# Patient Record
Sex: Female | Born: 1935 | Race: White | Hispanic: No | Marital: Married | Smoking: Never smoker
Health system: Southern US, Community
[De-identification: ages and names within clinical notes are randomized; demographics above are authoritative.]

## PROBLEM LIST (undated history)

## (undated) DIAGNOSIS — N183 Chronic kidney disease, stage 3 unspecified: Secondary | ICD-10-CM

## (undated) DIAGNOSIS — Z79899 Other long term (current) drug therapy: Secondary | ICD-10-CM

## (undated) DIAGNOSIS — F0390 Unspecified dementia without behavioral disturbance: Secondary | ICD-10-CM

## (undated) DIAGNOSIS — I701 Atherosclerosis of renal artery: Secondary | ICD-10-CM

## (undated) DIAGNOSIS — I5032 Chronic diastolic (congestive) heart failure: Secondary | ICD-10-CM

## (undated) DIAGNOSIS — I482 Chronic atrial fibrillation, unspecified: Secondary | ICD-10-CM

## (undated) DIAGNOSIS — I3139 Other pericardial effusion (noninflammatory): Secondary | ICD-10-CM

## (undated) DIAGNOSIS — I313 Pericardial effusion (noninflammatory): Secondary | ICD-10-CM

## (undated) DIAGNOSIS — R001 Bradycardia, unspecified: Secondary | ICD-10-CM

## (undated) DIAGNOSIS — I2510 Atherosclerotic heart disease of native coronary artery without angina pectoris: Secondary | ICD-10-CM

## (undated) DIAGNOSIS — K219 Gastro-esophageal reflux disease without esophagitis: Secondary | ICD-10-CM

## (undated) DIAGNOSIS — I495 Sick sinus syndrome: Secondary | ICD-10-CM

## (undated) DIAGNOSIS — R29898 Other symptoms and signs involving the musculoskeletal system: Secondary | ICD-10-CM

## (undated) DIAGNOSIS — I10 Essential (primary) hypertension: Secondary | ICD-10-CM

## (undated) HISTORY — DX: Pericardial effusion (noninflammatory): I31.3

## (undated) HISTORY — DX: Essential (primary) hypertension: I10

## (undated) HISTORY — PX: KNEE ARTHROSCOPY: SUR90

## (undated) HISTORY — DX: Other long term (current) drug therapy: Z79.899

## (undated) HISTORY — DX: Other symptoms and signs involving the musculoskeletal system: R29.898

## (undated) HISTORY — PX: CHOLECYSTECTOMY: SHX55

## (undated) HISTORY — PX: CARDIOVERSION: SHX1299

## (undated) HISTORY — DX: Chronic atrial fibrillation, unspecified: I48.20

## (undated) HISTORY — DX: Chronic kidney disease, stage 3 (moderate): N18.3

## (undated) HISTORY — DX: Atherosclerosis of renal artery: I70.1

## (undated) HISTORY — DX: Chronic kidney disease, stage 3 unspecified: N18.30

## (undated) HISTORY — DX: Chronic diastolic (congestive) heart failure: I50.32

## (undated) HISTORY — DX: Sick sinus syndrome: I49.5

## (undated) HISTORY — PX: BACK SURGERY: SHX140

## (undated) HISTORY — DX: Other pericardial effusion (noninflammatory): I31.39

## (undated) HISTORY — DX: Bradycardia, unspecified: R00.1

## (undated) HISTORY — DX: Gastro-esophageal reflux disease without esophagitis: K21.9

---

## 2006-12-17 ENCOUNTER — Ambulatory Visit: Payer: Self-pay | Admitting: Cardiology

## 2006-12-27 ENCOUNTER — Ambulatory Visit: Payer: Self-pay | Admitting: Cardiology

## 2007-01-20 ENCOUNTER — Ambulatory Visit: Payer: Self-pay | Admitting: Physician Assistant

## 2007-02-28 ENCOUNTER — Ambulatory Visit: Payer: Self-pay | Admitting: Cardiology

## 2007-03-11 ENCOUNTER — Ambulatory Visit: Payer: Self-pay | Admitting: Cardiology

## 2007-03-15 ENCOUNTER — Ambulatory Visit: Payer: Self-pay | Admitting: Cardiology

## 2007-04-04 ENCOUNTER — Ambulatory Visit: Payer: Self-pay | Admitting: Cardiology

## 2007-04-12 ENCOUNTER — Ambulatory Visit: Payer: Self-pay | Admitting: Cardiology

## 2007-04-13 ENCOUNTER — Ambulatory Visit: Payer: Self-pay | Admitting: Cardiology

## 2007-04-21 ENCOUNTER — Ambulatory Visit: Payer: Self-pay | Admitting: Cardiology

## 2007-04-25 ENCOUNTER — Ambulatory Visit: Payer: Self-pay | Admitting: Cardiology

## 2007-04-28 ENCOUNTER — Ambulatory Visit: Payer: Self-pay | Admitting: Cardiology

## 2007-05-04 ENCOUNTER — Ambulatory Visit: Payer: Self-pay | Admitting: Cardiology

## 2007-05-13 ENCOUNTER — Ambulatory Visit: Payer: Self-pay | Admitting: Cardiology

## 2007-05-19 ENCOUNTER — Ambulatory Visit: Payer: Self-pay | Admitting: Cardiology

## 2007-05-20 ENCOUNTER — Ambulatory Visit: Payer: Self-pay | Admitting: Cardiology

## 2007-05-27 ENCOUNTER — Ambulatory Visit: Payer: Self-pay | Admitting: Cardiology

## 2007-07-15 ENCOUNTER — Ambulatory Visit: Payer: Self-pay | Admitting: Cardiology

## 2007-08-12 ENCOUNTER — Ambulatory Visit: Payer: Self-pay | Admitting: Cardiology

## 2007-08-24 ENCOUNTER — Ambulatory Visit: Payer: Self-pay | Admitting: Cardiology

## 2007-09-09 ENCOUNTER — Ambulatory Visit: Payer: Self-pay | Admitting: Cardiology

## 2007-09-23 ENCOUNTER — Ambulatory Visit: Payer: Self-pay | Admitting: Cardiology

## 2007-10-21 ENCOUNTER — Ambulatory Visit: Payer: Self-pay | Admitting: Cardiology

## 2007-11-21 ENCOUNTER — Ambulatory Visit: Payer: Self-pay | Admitting: Cardiology

## 2008-01-05 ENCOUNTER — Ambulatory Visit: Payer: Self-pay | Admitting: Cardiology

## 2008-02-02 ENCOUNTER — Ambulatory Visit: Payer: Self-pay | Admitting: Cardiology

## 2008-03-01 ENCOUNTER — Ambulatory Visit: Payer: Self-pay | Admitting: Cardiology

## 2008-03-12 ENCOUNTER — Encounter: Payer: Self-pay | Admitting: Cardiology

## 2008-03-12 ENCOUNTER — Ambulatory Visit: Payer: Self-pay | Admitting: Cardiology

## 2008-04-03 ENCOUNTER — Ambulatory Visit: Payer: Self-pay | Admitting: Cardiology

## 2008-05-11 ENCOUNTER — Ambulatory Visit: Payer: Self-pay | Admitting: Cardiology

## 2008-05-25 ENCOUNTER — Ambulatory Visit: Payer: Self-pay | Admitting: Cardiology

## 2008-06-15 ENCOUNTER — Ambulatory Visit: Payer: Self-pay | Admitting: Cardiology

## 2008-07-17 ENCOUNTER — Ambulatory Visit: Payer: Self-pay | Admitting: Cardiology

## 2008-08-14 ENCOUNTER — Ambulatory Visit: Payer: Self-pay | Admitting: Cardiology

## 2008-08-16 ENCOUNTER — Encounter: Payer: Self-pay | Admitting: Cardiology

## 2008-09-14 ENCOUNTER — Ambulatory Visit: Payer: Self-pay | Admitting: Cardiology

## 2008-10-12 ENCOUNTER — Ambulatory Visit: Payer: Self-pay | Admitting: Cardiology

## 2008-11-07 ENCOUNTER — Ambulatory Visit: Payer: Self-pay | Admitting: Cardiology

## 2008-12-04 ENCOUNTER — Ambulatory Visit: Payer: Self-pay | Admitting: Cardiology

## 2008-12-18 ENCOUNTER — Encounter: Payer: Self-pay | Admitting: Cardiology

## 2009-01-01 ENCOUNTER — Ambulatory Visit: Payer: Self-pay | Admitting: Cardiology

## 2009-01-29 ENCOUNTER — Ambulatory Visit: Payer: Self-pay | Admitting: Cardiology

## 2009-02-18 ENCOUNTER — Ambulatory Visit: Payer: Self-pay | Admitting: Cardiology

## 2009-02-18 ENCOUNTER — Encounter: Payer: Self-pay | Admitting: Physician Assistant

## 2009-02-20 ENCOUNTER — Encounter: Payer: Self-pay | Admitting: Cardiology

## 2009-02-26 ENCOUNTER — Ambulatory Visit: Payer: Self-pay

## 2009-03-19 ENCOUNTER — Ambulatory Visit: Payer: Self-pay | Admitting: Cardiology

## 2009-03-25 ENCOUNTER — Encounter: Payer: Self-pay | Admitting: *Deleted

## 2009-04-16 ENCOUNTER — Ambulatory Visit: Payer: Self-pay | Admitting: Cardiology

## 2009-05-14 ENCOUNTER — Ambulatory Visit: Payer: Self-pay | Admitting: Cardiology

## 2009-05-14 LAB — CONVERTED CEMR LAB: POC INR: 2.5

## 2009-05-27 DIAGNOSIS — I10 Essential (primary) hypertension: Secondary | ICD-10-CM

## 2009-05-27 DIAGNOSIS — I495 Sick sinus syndrome: Secondary | ICD-10-CM

## 2009-06-11 ENCOUNTER — Ambulatory Visit: Payer: Self-pay | Admitting: Cardiology

## 2009-07-09 ENCOUNTER — Ambulatory Visit: Payer: Self-pay | Admitting: Cardiology

## 2009-08-06 ENCOUNTER — Ambulatory Visit: Payer: Self-pay | Admitting: Cardiology

## 2009-08-06 LAB — CONVERTED CEMR LAB: POC INR: 2.7

## 2009-09-03 ENCOUNTER — Ambulatory Visit: Payer: Self-pay | Admitting: Cardiology

## 2009-09-03 LAB — CONVERTED CEMR LAB: POC INR: 1.9

## 2009-09-25 ENCOUNTER — Ambulatory Visit: Payer: Self-pay | Admitting: Cardiology

## 2009-10-01 ENCOUNTER — Ambulatory Visit: Payer: Self-pay | Admitting: Cardiology

## 2009-10-03 ENCOUNTER — Encounter: Payer: Self-pay | Admitting: Cardiology

## 2009-10-29 ENCOUNTER — Ambulatory Visit: Payer: Self-pay | Admitting: Cardiology

## 2009-11-26 ENCOUNTER — Ambulatory Visit: Payer: Self-pay | Admitting: Cardiology

## 2009-11-26 LAB — CONVERTED CEMR LAB: POC INR: 2.6

## 2009-12-24 ENCOUNTER — Encounter: Payer: Self-pay | Admitting: Cardiology

## 2009-12-27 ENCOUNTER — Ambulatory Visit: Payer: Self-pay | Admitting: Cardiology

## 2010-01-24 ENCOUNTER — Ambulatory Visit: Payer: Self-pay | Admitting: Cardiology

## 2010-02-25 ENCOUNTER — Ambulatory Visit: Payer: Self-pay | Admitting: Cardiology

## 2010-03-25 ENCOUNTER — Ambulatory Visit: Payer: Self-pay | Admitting: Cardiology

## 2010-03-25 LAB — CONVERTED CEMR LAB: POC INR: 2.6

## 2010-04-22 ENCOUNTER — Ambulatory Visit: Payer: Self-pay | Admitting: Cardiology

## 2010-04-30 ENCOUNTER — Ambulatory Visit: Payer: Self-pay | Admitting: Cardiology

## 2010-04-30 DIAGNOSIS — R5383 Other fatigue: Secondary | ICD-10-CM

## 2010-04-30 DIAGNOSIS — R5381 Other malaise: Secondary | ICD-10-CM

## 2010-05-15 ENCOUNTER — Encounter: Payer: Self-pay | Admitting: Cardiology

## 2010-05-20 ENCOUNTER — Ambulatory Visit: Payer: Self-pay | Admitting: Cardiology

## 2010-05-20 LAB — CONVERTED CEMR LAB: POC INR: 2.9

## 2010-06-02 ENCOUNTER — Encounter: Payer: Self-pay | Admitting: Cardiology

## 2010-06-17 ENCOUNTER — Encounter: Payer: Self-pay | Admitting: Cardiology

## 2010-06-17 ENCOUNTER — Ambulatory Visit: Payer: Self-pay

## 2010-06-17 LAB — CONVERTED CEMR LAB: POC INR: 2.9

## 2010-07-15 ENCOUNTER — Ambulatory Visit: Payer: Self-pay | Admitting: Cardiology

## 2010-07-15 LAB — CONVERTED CEMR LAB: POC INR: 2.3

## 2010-08-12 ENCOUNTER — Ambulatory Visit: Admission: RE | Admit: 2010-08-12 | Discharge: 2010-08-12 | Payer: Self-pay | Source: Home / Self Care

## 2010-08-12 LAB — CONVERTED CEMR LAB: POC INR: 2.7

## 2010-08-15 ENCOUNTER — Encounter: Payer: Self-pay | Admitting: Cardiology

## 2010-08-19 ENCOUNTER — Encounter: Payer: Self-pay | Admitting: Cardiology

## 2010-08-21 ENCOUNTER — Encounter: Payer: Self-pay | Admitting: Cardiology

## 2010-08-21 ENCOUNTER — Encounter: Payer: Self-pay | Admitting: Cardiovascular Disease

## 2010-08-22 ENCOUNTER — Encounter: Payer: Self-pay | Admitting: Cardiology

## 2010-08-22 ENCOUNTER — Encounter: Payer: Self-pay | Admitting: Cardiovascular Disease

## 2010-08-23 ENCOUNTER — Encounter: Payer: Self-pay | Admitting: Cardiology

## 2010-08-26 ENCOUNTER — Encounter: Payer: Self-pay | Admitting: Cardiology

## 2010-08-27 ENCOUNTER — Encounter: Payer: Self-pay | Admitting: Cardiology

## 2010-08-27 ENCOUNTER — Ambulatory Visit: Admission: RE | Admit: 2010-08-27 | Discharge: 2010-08-27 | Payer: Self-pay | Source: Home / Self Care

## 2010-09-04 ENCOUNTER — Ambulatory Visit
Admission: RE | Admit: 2010-09-04 | Discharge: 2010-09-04 | Payer: Self-pay | Source: Home / Self Care | Attending: Cardiovascular Disease | Admitting: Cardiovascular Disease

## 2010-09-04 DIAGNOSIS — I701 Atherosclerosis of renal artery: Secondary | ICD-10-CM | POA: Insufficient documentation

## 2010-09-05 ENCOUNTER — Encounter: Payer: Self-pay | Admitting: Cardiology

## 2010-09-05 ENCOUNTER — Ambulatory Visit
Admission: RE | Admit: 2010-09-05 | Discharge: 2010-09-05 | Payer: Self-pay | Source: Home / Self Care | Attending: Cardiology | Admitting: Cardiology

## 2010-09-09 ENCOUNTER — Ambulatory Visit: Admission: RE | Admit: 2010-09-09 | Discharge: 2010-09-09 | Payer: Self-pay | Source: Home / Self Care

## 2010-09-09 LAB — CONVERTED CEMR LAB: POC INR: 2.6

## 2010-09-10 NOTE — Medication Information (Signed)
Summary: ccr-lr  Anticoagulant Therapy  Managed by: Karen Hey, RN Referring MD: Jerral Bonito PCP: Lucianne Muss MD: Antoine Poche MD, Fayrene Fearing Indication 1: Atrial Fibrillation (ICD-427.31) Lab [facility] [place] Site: [place] INR POC 2.3  Dietary changes: no    Health status changes: no    Bleeding/hemorrhagic complications: no    Recent/future hospitalizations: no    Any changes in medication regimen? no    Recent/future dental: no  Any missed doses?: no       Is patient compliant with meds? yes       Allergies: No Known Drug Allergies  Anticoagulation Management History:      The patient is taking warfarin and comes in today for a routine follow up visit.  Positive risk factors for bleeding include an age of 75 years or older.  The bleeding index is 'intermediate risk'.  Positive CHADS2 values include History of HTN and Age > 28 years old.  The start date was 02/08/2007.  Anticoagulation responsible Etana Beets: Antoine Poche MD, Fayrene Fearing.  INR POC: 2.3.  Cuvette Lot#: [id number].  Exp: 10/11.    Anticoagulation Management Assessment/Plan:      The patient's current anticoagulation dose is Coumadin 5 mg tabs: use as directed.  The target INR is 2 - 3.  The next INR is due 08/12/2010.  Anticoagulation instructions were given to patient.  Results were reviewed/authorized by Karen Hey, RN.  She was notified by Karen Hey RN.         Prior Anticoagulation Instructions: INR 2.9 Continue coumadin 2.5mg  once daily except 5mg  on M,W,F  Current Anticoagulation Instructions: INR 2.3 Continue coumadin 2.5mg  once daily except 5mg  on M,W,F

## 2010-09-10 NOTE — Letter (Signed)
Summary: Appointment -missed   [facility] [address]   [place], [place] [address]   Phone: [phone]  Fax: [phone]     Dec 24, 2009 MRN: [id number]     [facility] [address] [place], [place]  [address]     Dear Ms. Neldon Labella,  Our records indicate you missed your appointment on Dec 24, 2009                        with [facility].   It is very important that we reach you to reschedule this appointment. We look forward to participating in your health care needs.   Please contact us at the number listed above at your earliest convenience to reschedule this appointment.   Sincerely,    Glass blower/designer

## 2010-09-10 NOTE — Medication Information (Signed)
Summary: ccr-lr  Anticoagulant Therapy  Managed by: Vashti Hey, RN Referring MD: Jerral Bonito PCP: Lucianne Muss MD: Andee Lineman MD, Michelle Piper Indication 1: Atrial Fibrillation (ICD-427.31) Lab [facility] [place] Site: [place] INR POC 2.5  Dietary changes: no    Health status changes: no    Bleeding/hemorrhagic complications: no    Recent/future hospitalizations: no    Any changes in medication regimen? no    Recent/future dental: no  Any missed doses?: no       Is patient compliant with meds? yes       Allergies: No Known Drug Allergies  Anticoagulation Management History:      The patient is taking warfarin and comes in today for a routine follow up visit.  Positive risk factors for bleeding include an age of 75 years or older.  The bleeding index is 'intermediate risk'.  Positive CHADS2 values include History of HTN and Age > 75 years old.  The start date was 02/08/2007.  Anticoagulation responsible provider: Andee Lineman MD, Michelle Piper.  INR POC: 2.5.  Cuvette Lot#: [id number].  Exp: 10/11.    Anticoagulation Management Assessment/Plan:      The patient's current anticoagulation dose is Coumadin 5 mg tabs: use as directed.  The target INR is 2 - 3.  The next INR is due 10/29/2009.  Anticoagulation instructions were given to patient.  Results were reviewed/authorized by Vashti Hey, RN.  She was notified by Vashti Hey RN.         Prior Anticoagulation Instructions: INR 1.9 Take coumadin 5mg  tonight then resume 2.5mg  once daily except 5mg  on M,W,F  Current Anticoagulation Instructions: INR 2.5 Continue coumadin 2.5mg  once daily except 5mg  on M,W,F

## 2010-09-10 NOTE — Medication Information (Signed)
Summary: ccr-lr  Anticoagulant Therapy  Managed by: Vashti Hey, RN Referring MD: Jerral Bonito PCP: Lucianne Muss MD: Andee Lineman MD, Michelle Piper Indication 1: Atrial Fibrillation (ICD-427.31) Lab [facility] [place] Site: [facility] INR POC 2.6  Dietary changes: no    Health status changes: no    Bleeding/hemorrhagic complications: no    Recent/future hospitalizations: no    Any changes in medication regimen? no    Recent/future dental: no  Any missed doses?: no       Is patient compliant with meds? yes       Allergies: No Known Drug Allergies  Anticoagulation Management History:      The patient is taking warfarin and comes in today for a routine follow up visit.  Positive risk factors for bleeding include an age of 75 years or older.  The bleeding index is 'intermediate risk'.  Positive CHADS2 values include History of HTN and Age > 22 years old.  The start date was 02/08/2007.  Anticoagulation responsible provider: Andee Lineman MD, Michelle Piper.  INR POC: 2.6.  Cuvette Lot#: [id number].  Exp: 10/11.    Anticoagulation Management Assessment/Plan:      The patient's current anticoagulation dose is Coumadin 5 mg tabs: use as directed.  The target INR is 2 - 3.  The next INR is due 05/20/2010.  Anticoagulation instructions were given to patient.  Results were reviewed/authorized by Vashti Hey, RN.  She was notified by Vashti Hey RN.         Prior Anticoagulation Instructions: INR 2.6 Continue coumadin 2.5mg  once daily except 5mg  on M,W,F  Current Anticoagulation Instructions: Same as Prior Instructions.

## 2010-09-10 NOTE — Letter (Signed)
Summary: External Correspondence/ BLOOD PRESSURE READINGS  External Correspondence/ BLOOD PRESSURE READINGS   Imported By: Dorise Hiss 10/03/2009 16:53:28  _____________________________________________________________________  External Attachment:    Type:   Image     Comment:   External Document  Appended Document: External Correspondence/ BLOOD PRESSURE READINGS Poor BP control add clonidine 0.1mg  by mouth qam  Appended Document: External Correspondence/ BLOOD PRESSURE READINGS Patient informed of the above. [facility]ses [facility] [address]

## 2010-09-10 NOTE — Medication Information (Signed)
Summary: ccr-lr  Anticoagulant Therapy  Managed by: Vashti Hey, RN Referring MD: Jerral Bonito PCP: Lucianne Muss MD: Antoine Poche MD, Fayrene Fearing Indication 1: Atrial Fibrillation (ICD-427.31) Lab [facility] [place] Site: [place] INR POC 2.6  Dietary changes: no    Health status changes: no    Bleeding/hemorrhagic complications: no    Recent/future hospitalizations: no    Any changes in medication regimen? no    Recent/future dental: no  Any missed doses?: no       Is patient compliant with meds? yes       Allergies: No Known Drug Allergies  Anticoagulation Management History:      The patient is taking warfarin and comes in today for a routine follow up visit.  Positive risk factors for bleeding include an age of 75 years or older.  The bleeding index is 'intermediate risk'.  Positive CHADS2 values include History of HTN and Age > 18 years old.  The start date was 02/08/2007.  Anticoagulation responsible provider: Antoine Poche MD, Fayrene Fearing.  INR POC: 2.6.  Cuvette Lot#: [id number].  Exp: 10/11.    Anticoagulation Management Assessment/Plan:      The patient's current anticoagulation dose is Coumadin 5 mg tabs: use as directed.  The target INR is 2 - 3.  The next INR is due 12/24/2009.  Anticoagulation instructions were given to patient.  Results were reviewed/authorized by Vashti Hey, RN.  She was notified by Vashti Hey RN.         Prior Anticoagulation Instructions: INR 2.6 Continue coumadin 2.5mg  once daily except 5mg  on M,W,F  Current Anticoagulation Instructions: INR 2.6 Continue couamdin 2.5mg  once daily except 5mg  on M,W,F

## 2010-09-10 NOTE — Miscellaneous (Signed)
Summary: meds list update  Clinical Lists Changes  Medications: Removed medication of AMLODIPINE BESYLATE 5 MG TABS (AMLODIPINE BESYLATE) Take one tablet by mouth daily - Signed Changed medication from TOPROL XL 50 MG XR24H-TAB (METOPROLOL SUCCINATE) Take 1 tablet by mouth two times a day to TOPROL XL 50 MG XR24H-TAB (METOPROLOL SUCCINATE) 1/2 tab daily - Signed Changed medication from CARTIA XT 120 MG XR24H-CAP (DILTIAZEM HCL COATED BEADS) Take 1 tablet by mouth once a day to CARTIA XT 240 MG XR24H-CAP (DILTIAZEM HCL COATED BEADS) Take 1 tablet by mouth once a day - Signed Changed medication from DIOVAN HCT 320-25 MG TABS (VALSARTAN-HYDROCHLOROTHIAZIDE) Take 1 tablet by mouth once a day to DIOVAN HCT 320-12.5 MG TABS (VALSARTAN-HYDROCHLOROTHIAZIDE) Take 1 tablet by mouth once a day - Signed Added new medication of LOVASTATIN 20 MG TABS (LOVASTATIN) Take 1 tab by mouth at bedtime - Signed

## 2010-09-10 NOTE — Medication Information (Signed)
Summary: ccr-lr  Anticoagulant Therapy  Managed by: Vashti Hey, RN Referring MD: Jerral Bonito PCP: Lucianne Muss MD: Myrtis Ser MD, Tinnie Gens Indication 1: Atrial Fibrillation (ICD-427.31) Lab [facility] [place] Site: [place] INR POC 2.6  Dietary changes: no    Health status changes: no    Bleeding/hemorrhagic complications: no    Recent/future hospitalizations: no    Any changes in medication regimen? no    Recent/future dental: no  Any missed doses?: no       Is patient compliant with meds? yes       Allergies: No Known Drug Allergies  Anticoagulation Management History:      The patient is taking warfarin and comes in today for a routine follow up visit.  Positive risk factors for bleeding include an age of 74 years or older.  The bleeding index is 'intermediate risk'.  Positive CHADS2 values include History of HTN and Age > 105 years old.  The start date was 02/08/2007.  Anticoagulation responsible provider: Myrtis Ser MD, Tinnie Gens.  INR POC: 2.6.  Cuvette Lot#: [id number].  Exp: 10/11.    Anticoagulation Management Assessment/Plan:      The patient's current anticoagulation dose is Coumadin 5 mg tabs: use as directed.  The target INR is 2 - 3.  The next INR is due 03/25/2010.  Anticoagulation instructions were given to patient.  Results were reviewed/authorized by Vashti Hey, RN.  She was notified by Vashti Hey RN.         Prior Anticoagulation Instructions: INR 2.4 Continue coumadin 2.5mg  once daily except 5mg  on Mondays, Wednesdays and Fridays  Current Anticoagulation Instructions: INR 2.6 Continue coumadin 2.5mg  once daily except 5mg  on M,W,F

## 2010-09-10 NOTE — Medication Information (Signed)
Summary: ccr-lr  Anticoagulant Therapy  Managed by: Vashti Hey, RN Referring MD: Jerral Bonito PCP: Lucianne Muss MD: Andee Lineman MD, Michelle Piper Indication 1: Atrial Fibrillation (ICD-427.31) Lab [facility] [place] Site: [place] INR POC 2.9  Dietary changes: no    Health status changes: no    Bleeding/hemorrhagic complications: no    Recent/future hospitalizations: no    Any changes in medication regimen? no    Recent/future dental: no  Any missed doses?: no       Is patient compliant with meds? yes       Allergies: No Known Drug Allergies  Anticoagulation Management History:      The patient is taking warfarin and comes in today for a routine follow up visit.  Positive risk factors for bleeding include an age of 75 years or older.  The bleeding index is 'intermediate risk'.  Positive CHADS2 values include History of HTN and Age > 42 years old.  The start date was 02/08/2007.  Anticoagulation responsible provider: Andee Lineman MD, Michelle Piper.  INR POC: 2.9.  Cuvette Lot#: [id number].  Exp: 10/11.    Anticoagulation Management Assessment/Plan:      The patient's current anticoagulation dose is Coumadin 5 mg tabs: use as directed.  The target INR is 2 - 3.  The next INR is due 06/17/2010.  Anticoagulation instructions were given to patient.  Results were reviewed/authorized by Vashti Hey, RN.  She was notified by Vashti Hey RN.         Prior Anticoagulation Instructions: INR 2.6 Continue coumadin 2.5mg  once daily except 5mg  on M,W,F  Current Anticoagulation Instructions: INR 2.9 Continue coumadin 2.5mg  once daily excpet 5mg  on M,W,F

## 2010-09-10 NOTE — Assessment & Plan Note (Signed)
Summary: 6 mo fu per aug reminder   Visit Type:  Follow-up Primary Provider:  Sherril Croon   History of Present Illness: the patient is a 77 year old female with a history of paroxysmal atrial fibrillation, on chronic Coumadin and hypertension.  She is also [employer] and is maintaining normal sinus rhythm without palpitations.  She reported decreased strength in both lower extremities and feels often off balance.  She has difficulty squatting.  She reports difficulty walking up a flight of stairs.  Eating bending forwards is difficult.  She denies any chest pain orthopnea PND.  She has nonpitting lower extremity edema and significant varicosities.  Preventive Screening-Counseling & Management  Alcohol-Tobacco     Smoking Status: never  Current Medications (verified): 1)  Cartia Xt 240 Mg Xr24h-Cap (Diltiazem Hcl Coated Beads) .... Take 1 Tablet By Mouth Once A Day 2)  Flecainide Acetate 50 Mg Tabs (Flecainide Acetate) .... Take 1 Tablet By Mouth Two Times A Day 3)  Aspir-Low 81 Mg Tbec (Aspirin) .... Take 1 Tablet By Mouth Once A Day 4)  Coumadin 5 Mg Tabs (Warfarin Sodium) .... Use As Directed 5)  Diovan Hct 320-25 Mg Tabs (Valsartan-Hydrochlorothiazide) .... Take 1 Tablet By Mouth Once A Day 6)  Prilosec Otc 20 Mg Tbec (Omeprazole Magnesium) .... Take 1 Tablet By Mouth Once A Day 7)  Lovastatin 20 Mg Tabs (Lovastatin) .... Take 1 Tab By Mouth At Bedtime 8)  Clonidine Hcl 0.1 Mg Tabs (Clonidine Hcl) .... Take 1 Tablet By Mouth Once A Day 9)  Bystolic 5 Mg Tabs (Nebivolol Hcl) .... Take 1 Tablet By Mouth Once A Day 10)  Glyburide-Metformin 2.5-500 Mg Tabs (Glyburide-Metformin) .... Take 1 Tablet By Mouth Once A Day 11)  Multivitamins  Tabs (Multiple Vitamin) .... Take 1 Tablet By Mouth Once A Day 12)  Biotin 5000 5 Mg Caps (Biotin) .... Take 1 Tablet By Mouth Once A Day 13)  Caltrate 600+d 600-400 Mg-Unit Tabs (Calcium Carbonate-Vitamin D) .... Take 1 Tablet By Mouth Once A Day 14)  Vitamin  D3 5000 Unit Tabs (Cholecalciferol) .... Take 1 Tablet By Mouth Once A Day 15)  Mucinex 600 Mg Xr12h-Tab (Guaifenesin) .... Take 1 Tablet By Mouth Once A Day As Needed  Allergies (verified): No Known Drug Allergies  Comments:  Nurse/Medical Assistant: The patient's medication list and allergies were reviewed with the patient and were updated in the Medication and Allergy Lists.  Past History:  Past Surgical History: Last updated: 05/27/2009 Knee Arthroscopy  Family History: Last updated: 05/27/2009 Family History of Cancer:  CHF  Cirrhosis of liver  Social History: Last updated: 05/27/2009 Retired  Married  Tobacco Use - No.  Alcohol Use - no Regular Exercise - yes Drug Use - no  Risk Factors: Exercise: yes (05/27/2009)  Risk Factors: Smoking Status: never (04/30/2010)  Past Medical History: SINUS BRADYCARDIA (ICD-427.81) HYPERTENSION, UNSPECIFIED (ICD-401.9) ATRIAL FIBRILLATION (ICD-427.31) Coumadin therapy lower extremity weakness    Review of Systems       The patient complains of muscle weakness and leg swelling.  The patient denies fatigue, malaise, fever, weight gain/loss, vision loss, decreased hearing, hoarseness, chest pain, palpitations, shortness of breath, prolonged cough, wheezing, sleep apnea, coughing up blood, abdominal pain, blood in stool, nausea, vomiting, diarrhea, heartburn, incontinence, blood in urine, joint pain, rash, skin lesions, headache, fainting, dizziness, depression, anxiety, enlarged lymph nodes, easy bruising or bleeding, and environmental allergies.    Vital Signs:  Patient profile:   77 year old female Height:  63 inches Weight:      161 pounds Pulse rate:   46 / minute BP sitting:   170 / 73  (left arm) Cuff size:   regular  Vitals Entered By: Carlye Grippe (April 30, 2010 9:46 AM)  Physical Exam  Additional Exam:  General: Well-developed, well-nourished in no distress head: Normocephalic and  atraumatic eyes PERRLA/EOMI intact, conjunctiva and lids normal nose: No deformity or lesions mouth normal dentition, normal posterior pharynx neck: Supple, no JVD.  No masses, thyromegaly or abnormal cervical nodes lungs: Normal breath sounds bilaterally without wheezing.  Normal percussion heart: regular rate and rhythm with normal S1 and S2, no S3 or S4.  PMI is normal.  No pathological murmurs abdomen: Normal bowel sounds, abdomen is soft and nontender without masses, organomegaly or hernias noted.  No hepatosplenomegaly musculoskeletal: Back normal, normal gait muscle strength and tone normal pulsus: Pulse is normal in all 4 extremities Extremities: non pitting edema and varicosities. neurologic: Alert and oriented x 3, decreased strength in lower extremities skin: Intact without lesions or rashes cervical nodes: No significant adenopathy psychologic: Normal affect    Impression & Recommendations:  Problem # 1:  HYPERTENSION, UNSPECIFIED (ICD-401.9) blood pressure remains poorly controlled and we will increase Diovan/hydrochlorothiazide to 320/25 mg p.o. daily The following medications were removed from the medication list:    Toprol Xl 50 Mg Xr24h-tab (Metoprolol succinate) .Marland Kitchen... 1/2 tab daily Her updated medication list for this problem includes:    Cartia Xt 240 Mg Xr24h-cap (Diltiazem hcl coated beads) .Marland Kitchen... Take 1 tablet by mouth once a day    Aspir-low 81 Mg Tbec (Aspirin) .Marland Kitchen... Take 1 tablet by mouth once a day    Diovan Hct 320-25 Mg Tabs (Valsartan-hydrochlorothiazide) .Marland Kitchen... Take 1 tablet by mouth once a day    Clonidine Hcl 0.1 Mg Tabs (Clonidine hcl) .Marland Kitchen... Take 1 tablet by mouth once a day    Bystolic 5 Mg Tabs (Nebivolol hcl) .Marland Kitchen... Take 1 tablet by mouth once a day  Problem # 2:  COUMADIN THERAPY (ICD-V58.61) Assessment: Comment Only  Problem # 3:  ATRIAL FIBRILLATION (ICD-427.31) the patient remains in normal sinus rhythm.  She is tolerating flecainide. The  following medications were removed from the medication list:    Toprol Xl 50 Mg Xr24h-tab (Metoprolol succinate) .Marland Kitchen... 1/2 tab daily Her updated medication list for this problem includes:    Flecainide Acetate 50 Mg Tabs (Flecainide acetate) .Marland Kitchen... Take 1 tablet by mouth two times a day    Aspir-low 81 Mg Tbec (Aspirin) .Marland Kitchen... Take 1 tablet by mouth once a day    Coumadin 5 Mg Tabs (Warfarin sodium) ..... Use as directed    Bystolic 5 Mg Tabs (Nebivolol hcl) .Marland Kitchen... Take 1 tablet by mouth once a day  Problem # 4:  WEAKNESS (ICD-780.79) the patient reports weakness and loss of strength in both lower extremities.  EMGs and nerve conduction velocities will be ordered  Patient Instructions: 1)  Increase Diovan/HCTZ 320-25mg  daily 2)  Referral to Dr. Ninetta Lights for lower extremity weakness - EMG & NCV  3)  Follow up in  6 months Prescriptions: DIOVAN HCT 320-25 MG TABS (VALSARTAN-HYDROCHLOROTHIAZIDE) Take 1 tablet by mouth once a day  #30 x 6   Entered by:   Hoover Brunette, LPN   Authorized by:   Lewayne Bunting, MD, [facility]   Signed by:   Hoover Brunette, LPN on 19/14/7829   Method used:   [facility]  [address] [phone][facility] ([facility]ail[facility]       [address]       [place], [place]  [address]       Ph: [phone]       Fax: [phone]   RxID:   [id number]

## 2010-09-10 NOTE — Medication Information (Signed)
Summary: ccr-lr  Anticoagulant Therapy  Managed by: Vashti Hey, RN Referring MD: Jerral Bonito PCP: Lucianne Muss MD: Andee Lineman MD, Michelle Piper Indication 1: Atrial Fibrillation (ICD-427.31) [facility] [place] Site: [place] INR POC 1.9  Dietary changes: no    Health status changes: no    Bleeding/hemorrhagic complications: no    Recent/future hospitalizations: no    Any changes in medication regimen? no    Recent/future dental: no  Any missed doses?: no       Is patient compliant with meds? yes       Anticoagulation Management History:      The patient is taking warfarin and comes in today for a routine follow up visit.  Positive risk factors for bleeding include an age of 75 years or older.  The bleeding index is 'intermediate risk'.  Positive CHADS2 values include History of HTN and Age > 67 years old.  The start date was 02/08/2007.  Anticoagulation responsible provider: Andee Lineman MD, Michelle Piper.  INR POC: 1.9.  Cuvette Lot#: [id number].  Exp: 10/11.    Anticoagulation Management Assessment/Plan:      The target INR is 2 - 3.  The next INR is due 10/01/2009.  Anticoagulation instructions were given to patient.  Results were reviewed/authorized by Vashti Hey, RN.  She was notified by Vashti Hey RN.         Prior Anticoagulation Instructions: INR 2.7 Continue coumadin 2.5mg  once daily except 5mg  on M,W,F  Current Anticoagulation Instructions: INR 1.9 Take coumadin 5mg  tonight then resume 2.5mg  once daily except 5mg  on M,W,F

## 2010-09-10 NOTE — Medication Information (Signed)
Summary: ccr  Anticoagulant Therapy  Managed by: Vashti Hey, RN Referring MD: Jerral Bonito PCP: Lucianne Muss MD: Andee Lineman MD, Michelle Piper Indication 1: Atrial Fibrillation (ICD-427.31) Lab [facility] [place] Site: [place] INR POC 2.8  Dietary changes: no    Health status changes: no    Bleeding/hemorrhagic complications: no    Recent/future hospitalizations: no    Any changes in medication regimen? no    Recent/future dental: no  Any missed doses?: no       Is patient compliant with meds? yes       Allergies: No Known Drug Allergies  Anticoagulation Management History:      The patient is taking warfarin and comes in today for a routine follow up visit.  Positive risk factors for bleeding include an age of 75 years or older.  The bleeding index is 'intermediate risk'.  Positive CHADS2 values include History of HTN and Age > 76 years old.  The start date was 02/08/2007.  Anticoagulation responsible provider: Andee Lineman MD, Michelle Piper.  INR POC: 2.8.  Cuvette Lot#: [id number].  Exp: 10/11.    Anticoagulation Management Assessment/Plan:      The patient's current anticoagulation dose is Coumadin 5 mg tabs: use as directed.  The target INR is 2 - 3.  The next INR is due 01/24/2010.  Anticoagulation instructions were given to patient.  Results were reviewed/authorized by Vashti Hey, RN.  She was notified by Vashti Hey RN.         Prior Anticoagulation Instructions: INR 2.6 Continue couamdin 2.5mg  once daily except 5mg  on M,W,F  Current Anticoagulation Instructions: INR 2.8 Continue coumadin 2.5mg  once daily except 5mg  on M,W,F

## 2010-09-10 NOTE — Medication Information (Signed)
Summary: ccr-lr  Anticoagulant Therapy  Managed by: Vashti Hey, RN Referring MD: Jerral Bonito PCP: Lucianne Muss MD: Andee Lineman MD, Michelle Piper Indication 1: Atrial Fibrillation (ICD-427.31) [facility] [place] Site: [place] INR POC 2.4  Dietary changes: no    Health status changes: no    Bleeding/hemorrhagic complications: no    Recent/future hospitalizations: no    Any changes in medication regimen? no    Recent/future dental: no  Any missed doses?: no       Is patient compliant with meds? yes       Allergies: No Known Drug Allergies  Anticoagulation Management History:      The patient is taking warfarin and comes in today for a routine follow up visit.  Positive risk factors for bleeding include an age of 75 years or older.  The bleeding index is 'intermediate risk'.  Positive CHADS2 values include History of HTN and Age > 75 years old.  The start date was 02/08/2007.  Anticoagulation responsible provider: Andee Lineman MD, Michelle Piper.  INR POC: 2.4.  Cuvette Lot#: [id number].  Exp: 10/11.    Anticoagulation Management Assessment/Plan:      The patient's current anticoagulation dose is Coumadin 5 mg tabs: use as directed.  The target INR is 2 - 3.  The next INR is due 02/25/2010.  Anticoagulation instructions were given to patient.  Results were reviewed/authorized by Vashti Hey, RN.  She was notified by Vashti Hey RN.         Prior Anticoagulation Instructions: INR 2.8 Continue coumadin 2.5mg  once daily except 5mg  on M,W,F  Current Anticoagulation Instructions: INR 2.4 Continue coumadin 2.5mg  once daily except 5mg  on Mondays, Wednesdays and Fridays

## 2010-09-10 NOTE — Letter (Signed)
Summary: External Correspondence/ FAXED [facility]  External Correspondence/ FAXED [facility]   Imported By: Dorise Hiss 05/27/2010 15:46:04  _____________________________________________________________________  External Attachment:    Type:   Image     Comment:   External Document

## 2010-09-10 NOTE — Assessment & Plan Note (Signed)
Summary: 6 MO FU PER JAN REMIND[facility]-S[facility]   Visit Type:  Follow-up Primary Provider:  Sherril Croon  CC:  follow-up visit.  History of Present Illness: the patient is a 77 year old femalehistory of paroxysmal atrial fibrillation on chronic Coumadin therapy. The patient is hypertensive in the office today. Overall she's been doing well. She denies any chest pain shortness of breath orthopnea PND she has no palpitations or syncope.  Clinical Review Panels:  CXR CXR results Heart size is normal.         No pleural effusion or pulmonary edema.         Interstitial change of COPD is identified.                   IMPRESSION:         1.  No acute cardiopulmonary abnormalities. (02/20/2009)    Preventive Screening-Counseling & Management  Alcohol-Tobacco     Smoking Status: never  Current Medications (verified): 1)  Amlodipine Besylate 5 Mg Tabs (Amlodipine Besylate) .... Take One Tablet By Mouth Daily 2)  Toprol Xl 50 Mg Xr24h-Tab (Metoprolol Succinate) .... Take 1 Tablet By Mouth Two Times A Day 3)  Cartia Xt 120 Mg Xr24h-Cap (Diltiazem Hcl Coated Beads) .... Take 1 Tablet By Mouth Once A Day 4)  Flecainide Acetate 50 Mg Tabs (Flecainide Acetate) .... Take 1 Tablet By Mouth Two Times A Day 5)  Aspir-Low 81 Mg Tbec (Aspirin) .... Take 1 Tablet By Mouth Once A Day 6)  Coumadin 5 Mg Tabs (Warfarin Sodium) .... Use As Directed 7)  Diovan Hct 320-25 Mg Tabs (Valsartan-Hydrochlorothiazide) .... Take 1 Tablet By Mouth Once A Day 8)  Prilosec Otc 20 Mg Tbec (Omeprazole Magnesium) .... Take 1 Tablet By Mouth Once A Day  Allergies (verified): No Known Drug Allergies  Comments:  Nurse/Medical Assistant: The patient is currently on medications but does not know the name or dosage at this time. Instructed to contact our office with details. Will update medication list at that time.  Past History:  Past Medical History: Last updated: 09/24/2009 SINUS BRADYCARDIA (ICD-427.81) HYPERTENSION,  UNSPECIFIED (ICD-401.9) ATRIAL FIBRILLATION (ICD-427.31) Coumadin therapy    Past Surgical History: Last updated: 05/27/2009 Knee Arthroscopy  Family History: Last updated: 05/27/2009 Family History of Cancer:  CHF  Cirrhosis of liver  Social History: Last updated: 05/27/2009 Retired  Married  Tobacco Use - No.  Alcohol Use - no Regular Exercise - yes Drug Use - no  Risk Factors: Exercise: yes (05/27/2009)  Risk Factors: Smoking Status: never (09/25/2009)  Review of Systems  The patient denies fatigue, malaise, fever, weight gain/loss, vision loss, decreased hearing, hoarseness, chest pain, palpitations, shortness of breath, prolonged cough, wheezing, sleep apnea, coughing up blood, abdominal pain, blood in stool, nausea, vomiting, diarrhea, heartburn, incontinence, blood in urine, muscle weakness, joint pain, leg swelling, rash, skin lesions, headache, fainting, dizziness, depression, anxiety, enlarged lymph nodes, easy bruising or bleeding, and environmental allergies.    Vital Signs:  Patient profile:   77 year old female Height:      63 inches Weight:      164 pounds BMI:     29.16 Pulse rate:   51 / minute BP sitting:   175 / 73  (left arm) Cuff size:   regular  Vitals Entered By: Carlye Grippe (September 25, 2009 9:48 AM)  Nutrition Counseling: Patient's BMI is greater than 25 and therefore counseled on weight management options. CC: follow-up visit   Physical Exam  Additional Exam:  General: Well-developed,  well-nourished in no distress head: Normocephalic and atraumatic eyes PERRLA/EOMI intact, conjunctiva and lids normal nose: No deformity or lesions mouth normal dentition, normal posterior pharynx neck: Supple, no JVD.  No masses, thyromegaly or abnormal cervical nodes lungs: Normal breath sounds bilaterally without wheezing.  Normal percussion heart: regular rate and rhythm with normal S1 and S2, no S3 or S4.  PMI is normal.  No pathological  murmurs abdomen: Normal bowel sounds, abdomen is soft and nontender without masses, organomegaly or hernias noted.  No hepatosplenomegaly musculoskeletal: Back normal, normal gait muscle strength and tone normal pulsus: Pulse is normal in all 4 extremities Extremities: No peripheral pitting edema neurologic: Alert and oriented x 3 skin: Intact without lesions or rashes cervical nodes: No significant adenopathy psychologic: Normal affect    EKG  Procedure date:  09/25/2009  Findings:      sinus bradycardia. Heart rate 53 beats per minute. QTC 416 ms.  Impression & Recommendations:  Problem # 1:  ATRIAL FIBRILLATION (ICD-427.31) the patient's EKG was reviewed. She remains in normal sinus rhythm. She has no clinical complaints of paroxysmal atrial fibrillation. Her updated medication list for this problem includes:    Toprol Xl 50 Mg Xr24h-tab (Metoprolol succinate) .Marland Kitchen... Take 1 tablet by mouth two times a day    Flecainide Acetate 50 Mg Tabs (Flecainide acetate) .Marland Kitchen... Take 1 tablet by mouth two times a day    Aspir-low 81 Mg Tbec (Aspirin) .Marland Kitchen... Take 1 tablet by mouth once a day    Coumadin 5 Mg Tabs (Warfarin sodium) ..... Use as directed  Orders: EKG w/ Interpretation (93000)  Problem # 2:  HYPERTENSION, UNSPECIFIED (ICD-401.9) patient blood pressure is no particular control. I asked her to take her Diovan hydrochlorothiazide in the morning. I also asked her to make a log of her blood pressure readings and forward this to [place] and we will make the necessary changes if needed. Her updated medication list for this problem includes:    Amlodipine Besylate 5 Mg Tabs (Amlodipine besylate) .Marland Kitchen... Take one tablet by mouth daily    Toprol Xl 50 Mg Xr24h-tab (Metoprolol succinate) .Marland Kitchen... Take 1 tablet by mouth two times a day    Cartia Xt 120 Mg Xr24h-cap (Diltiazem hcl coated beads) .Marland Kitchen... Take 1 tablet by mouth once a day    Aspir-low 81 Mg Tbec (Aspirin) .Marland Kitchen... Take 1 tablet by mouth once a  day    Diovan Hct 320-25 Mg Tabs (Valsartan-hydrochlorothiazide) .Marland Kitchen... Take 1 tablet by mouth once a day  Problem # 3:  SINUS BRADYCARDIA (ICD-427.81) patient is a sinus bradycardia by EKG. However she is not symptomatic Her updated medication list for this problem includes:    Amlodipine Besylate 5 Mg Tabs (Amlodipine besylate) .Marland Kitchen... Take one tablet by mouth daily    Toprol Xl 50 Mg Xr24h-tab (Metoprolol succinate) .Marland Kitchen... Take 1 tablet by mouth two times a day    Cartia Xt 120 Mg Xr24h-cap (Diltiazem hcl coated beads) .Marland Kitchen... Take 1 tablet by mouth once a day    Flecainide Acetate 50 Mg Tabs (Flecainide acetate) .Marland Kitchen... Take 1 tablet by mouth two times a day    Aspir-low 81 Mg Tbec (Aspirin) .Marland Kitchen... Take 1 tablet by mouth once a day    Coumadin 5 Mg Tabs (Warfarin sodium) ..... Use as directed  Problem # 4:  COUMADIN THERAPY (ICD-V58.61) no complications are noted.  Patient Instructions: 1)  Take Diovan/HCTZ in the morning. 2)  Bring blood pressure readings back to office when come in  for coumadin check.   3)  Follow up in  6 months.

## 2010-09-10 NOTE — Miscellaneous (Signed)
Summary: Orders Update - Dr. Tesfaye  Clinical Lists Changes  Orders: Added new Referral order of Neurology Referral (Neuro) - Signed 

## 2010-09-10 NOTE — Medication Information (Signed)
Summary: [facility]  Anticoagulant Therapy  Managed by: Vashti Hey, RN Referring MD: Jerral Bonito PCP: Lucianne Muss MD: Andee Lineman MD, Michelle Piper Indication 1: Atrial Fibrillation (ICD-427.31) Lab [facility] [place] Site: [facility] INR POC 2.9  Dietary changes: no    Health status changes: no    Bleeding/hemorrhagic complications: no    Recent/future hospitalizations: no    Any changes in medication regimen? no    Recent/future dental: no  Any missed doses?: no       Is patient compliant with meds? yes       Allergies: No Known Drug Allergies  Anticoagulation Management History:      The patient is taking warfarin and comes in today for a routine follow up visit.  Positive risk factors for bleeding include an age of 75 years or older.  The bleeding index is 'intermediate risk'.  Positive CHADS2 values include History of HTN and Age > 75 years old.  The start date was 02/08/2007.  Anticoagulation responsible Anoop Hemmer: Andee Lineman MD, Michelle Piper.  INR POC: 2.9.  Cuvette Lot#: [id number].  Exp: 10/11.    Anticoagulation Management Assessment/Plan:      The patient's current anticoagulation dose is Coumadin 5 mg tabs: use as directed.  The target INR is 2 - 3.  The next INR is due 06/17/2010.  Anticoagulation instructions were given to patient.  Results were reviewed/authorized by Vashti Hey, RN.  She was notified by Vashti Hey RN.         Prior Anticoagulation Instructions: INR 2.9 Continue coumadin 2.5mg  once daily excpet 5mg  on M,W,F  Current Anticoagulation Instructions: INR 2.9 Continue coumadin 2.5mg  once daily except 5mg  on M,W,F

## 2010-09-10 NOTE — Medication Information (Signed)
Summary: ccr-lr  Anticoagulant Therapy  Managed by: Vashti Hey, RN Referring MD: Jerral Bonito PCP: Lucianne Muss MD: Antoine Poche MD, Fayrene Fearing Indication 1: Atrial Fibrillation (ICD-427.31) Lab [facility] Site: [facility] INR POC 2.6  Dietary changes: no    Health status changes: no    Bleeding/hemorrhagic complications: no    Recent/future hospitalizations: no    Any changes in medication regimen? no    Recent/future dental: no  Any missed doses?: no       Is patient compliant with meds? yes       Allergies: No Known Drug Allergies  Anticoagulation Management History:      The patient is taking warfarin and comes in today for a routine follow up visit.  Positive risk factors for bleeding include an age of 75 years or older.  The bleeding index is 'intermediate risk'.  Positive CHADS2 values include History of HTN and Age > 75 years old.  The start date was 02/08/2007.  Anticoagulation responsible Taquita Demby: Antoine Poche MD, Fayrene Fearing.  INR POC: 2.6.  Cuvette Lot#: [id number].  Exp: 10/11.    Anticoagulation Management Assessment/Plan:      The patient's current anticoagulation dose is Coumadin 5 mg tabs: use as directed.  The target INR is 2 - 3.  The next INR is due 04/22/2010.  Anticoagulation instructions were given to patient.  Results were reviewed/authorized by Vashti Hey, RN.  She was notified by Vashti Hey RN.         Prior Anticoagulation Instructions: INR 2.6 Continue coumadin 2.5mg  once daily except 5mg  on M,W,F  Current Anticoagulation Instructions: Same as Prior Instructions.

## 2010-09-10 NOTE — Medication Information (Signed)
Summary: ccr-lr  Anticoagulant Therapy  Managed by: Vashti Hey, RN Referring MD: Jerral Bonito PCP: Lucianne Muss MD: Andee Lineman MD, Michelle Piper Indication 1: Atrial Fibrillation (ICD-427.31) Lab [facility] [place] Site: [facility] INR POC 2.6  Dietary changes: no    Health status changes: no    Bleeding/hemorrhagic complications: no    Recent/future hospitalizations: no    Any changes in medication regimen? no    Recent/future dental: no  Any missed doses?: no       Is patient compliant with meds? yes       Allergies: No Known Drug Allergies  Anticoagulation Management History:      The patient is taking warfarin and comes in today for a routine follow up visit.  Positive risk factors for bleeding include an age of 75 years or older.  The bleeding index is 'intermediate risk'.  Positive CHADS2 values include History of HTN and Age > 64 years old.  The start date was 02/08/2007.  Anticoagulation responsible provider: Andee Lineman MD, Michelle Piper.  INR POC: 2.6.  Cuvette Lot#: [id number].  Exp: 10/11.    Anticoagulation Management Assessment/Plan:      The patient's current anticoagulation dose is Coumadin 5 mg tabs: use as directed.  The target INR is 2 - 3.  The next INR is due 11/26/2009.  Anticoagulation instructions were given to patient.  Results were reviewed/authorized by Vashti Hey, RN.  She was notified by Vashti Hey RN.         Prior Anticoagulation Instructions: INR 2.5 Continue coumadin 2.5mg  once daily except 5mg  on M,W,F  Current Anticoagulation Instructions: INR 2.6 Continue coumadin 2.5mg  once daily except 5mg  on M,W,F

## 2010-09-11 NOTE — Medication Information (Signed)
Summary: [facility] D/C MEDICATION SHEET ORDER  [facility] D/C MEDICATION SHEET ORDER   Imported By: Zachary George 09/05/2010 09:08:14  _____________________________________________________________________  External Attachment:    Type:   Image     Comment:   External Document

## 2010-09-11 NOTE — Assessment & Plan Note (Signed)
Summary: BLOOD PRESSURE CHECK  [facility] 1/14  Nurse Visit   Vital Signs:  Patient profile:   77 year old female Height:      63 inches BP sitting:   124 / 76  (left arm) Cuff size:   regular Comments BP on 1/16 - 8 a.m. - 141/60 & 8p.m.  128/58.  BP on 1/17 - 103/56.  Stated she has been feeling a little jittery since coming home from hospital.  BP this morning before coming here was 106/78.  Has not taken any of her morning meds yet Hoover Brunette, LPN  August 27, 2010 8:55 AM   Patient notified about changes on addendum from medication update from yesterday.  Has pending OV for 1/27. Hoover Brunette, LPN  August 27, 2010 8:58 AM    Allergies: No Known Drug Allergies  I had a long discussion with the patient regarding her blood pressure medications. Her blood pressure is under much better control. The plan will be in the future to hopefully further down titrate her clonidine. I do think we can end up with a simpler antihypertensive regimen. The patient appears to be particularly responsive to Minipress.  Lewayne Bunting, MD, [facility]  August 27, 2010 2:03 PM

## 2010-09-11 NOTE — Miscellaneous (Signed)
Summary: medicine update since hosp d/c   Clinical Lists Changes  Medications: Removed medication of FLECAINIDE ACETATE 50 MG TABS (FLECAINIDE ACETATE) Take 1 tablet by mouth two times a day - Signed Removed medication of ASPIR-LOW 81 MG TBEC (ASPIRIN) Take 1 tablet by mouth once a day - Signed Changed medication from PRILOSEC OTC 20 MG TBEC (OMEPRAZOLE MAGNESIUM) Take 1 tablet by mouth once a day to PRILOSEC OTC 20 MG TBEC (OMEPRAZOLE MAGNESIUM) Take 1 tablet by mouth once a day as needed - Signed Changed medication from CLONIDINE HCL 0.1 MG TABS (CLONIDINE HCL) Take 1 tablet by mouth once a day to CLONIDINE HCL 0.2 MG/24HR PTWK (CLONIDINE HCL) apply one patch every week - Signed Removed medication of BYSTOLIC 5 MG TABS (NEBIVOLOL HCL) Take 1 tablet by mouth once a day - Signed Removed medication of MULTIVITAMINS  TABS (MULTIPLE VITAMIN) Take 1 tablet by mouth once a day - Signed Removed medication of BIOTIN 5000 5 MG CAPS (BIOTIN) Take 1 tablet by mouth once a day - Signed Removed medication of CALTRATE 600+D 600-400 MG-UNIT TABS (CALCIUM CARBONATE-VITAMIN D) Take 1 tablet by mouth once a day - Signed Removed medication of VITAMIN D3 5000 UNIT TABS (CHOLECALCIFEROL) Take 1 tablet by mouth once a day - Signed Removed medication of MUCINEX 600 MG XR12H-TAB (GUAIFENESIN) Take 1 tablet by mouth once a day as needed - Signed Added new medication of PRAZOSIN HCL 2 MG CAPS (PRAZOSIN HCL) Take 1 tablet by mouth two times a day - Signed Added new medication of CHLORTHALIDONE 50 MG TABS (CHLORTHALIDONE) Take 1 tablet by mouth once a day - Signed Added new medication of EDARBI 80 MG TABS (AZILSARTAN MEDOXOMIL) Take 1 tablet by mouth once a day - Signed Added new medication of LABETALOL HCL 200 MG TABS (LABETALOL HCL) Take 1 tablet by mouth two times a day - Signed  Appended Document: medicine update since hosp d/c  Pt walked into office this afternoon stating she doesn't know how to take medications. She  states her BP was 103/56 this am and was concerned this was too low. She states Dr. Sherril Croon called around noon and told her not to take [place] any more. He told her if BO was more than 130 to take Prazosin. She states it was 167/63 when she got off the phone with him so she took Prazosin. She wonders what meds she should take and if she should be taking her Coumadin. Pt notified 103/56 is a good BP as long as she doesn't feel dizzy/lightheaded, which she denies. Pt notified to take meds as instructed at d/c from the hospital. Pt has appt w/Dr. Earnestine Leys tomorrow and meds will be reviewed further at this time.   Appended Document: medicine update since hosp d/c  first of all we need to stop changing her medications every 5 minutes. I want the patient to take the following regimen which is what I prescribed her upon hospital discharge: Cartia XT 240 minutes p.o. q. daily to date at noon Clonidine patch 0.2 mg q. week lean Minipress 2 mg p.o. b.i.d.today at 7 AM and 7 PM Chlorthalidone 50 mg p.o. q. daily to take at 7 AM Losartan 50 minutes p.o. q. daily ( I did not want the patient to take azilsartan as this will be too extensive in the future] to date at 7 AM Labetalol 200 mg twice a day to date at 10 AM and 10 PM [or last dose just before bedtime]  blood pressure  needs to be monitored daily if she is not symptomatic and her blood pressure is not greater than 180 mmHg no change should be made in the next couple of days. If she does become dizzy or her systolic blood pressure is less than 100 mmHgthen she can wean down her clonidine patch to 0.1 mg q. weekly, she may need a prescription for this. The goal is to try to avoid having her to take much clonidine or any rate lowering medication because of her bradycardia.we need to monitor and manage her blood pressure in the next week to 2 weeks until he is stable it's impossible between 2 physicians to get her blood pressure regulated. Prior to this there were too  many changes made every couple of days and the patient was told that she could not get her blood pressure down and that's why she was admitted. He actually was quite easy to get her blood pressure down on the appropriate medical regimen and anticipate that we can further wean down some of the medications.  Appended Document: medicine update since hosp d/c  Patient notified.

## 2010-09-11 NOTE — Assessment & Plan Note (Signed)
Summary: [facility] - POST [facility] 1/4   Visit Type:  Follow-up Primary Provider:  Sherril Croon   History of Present Illness: the patient is a 77 year old female with difficult to control blood pressure.  However since the addition of Minipress her blood pressures be much better.  As a matter of fact her blood pressure is now running low at times and the patient actually had to hold some of her medications this morning because her systolic blood pressure was around 70 mm of mercury.  She felt weak and dizzy.  Her blood pressure in the clinic today is very well controlled.   The patient saw Dr. Excell Seltzer couple days ago for evaluation of renal artery stenosis.  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  The patient has normal renal function.  She has normal renal size.  It was Dr. Earmon Phoenix impressions just as it was in mind that the patient would not benefit from intervention to her renal arteries.  For one stenosis did not appear critical and second the effect on blood pressure lowering is probably marginal.  As a matter fact we are not able to titrate down the patient's blood pressure medications.  Preventive Screening-Counseling & Management  Alcohol-Tobacco     Smoking Status: never  Current Medications (verified): 1)  Cartia Xt 240 Mg Xr24h-Cap (Diltiazem Hcl Coated Beads) .... Take One Tablet Daily At Noon 2)  Coumadin 5 Mg Tabs (Warfarin Sodium) .... Use As Directed 3)  Prilosec Otc 20 Mg Tbec (Omeprazole Magnesium) .... Take 1 Tablet By Mouth Once A Day As Needed 4)  Lovastatin 20 Mg Tabs (Lovastatin) .... Take 1 Tab By Mouth At Bedtime 5)  Glyburide-Metformin 2.5-500 Mg Tabs (Glyburide-Metformin) .... Take 1 Tablet By Mouth Once A Day 6)  Chlorthalidone 50 Mg Tabs (Chlorthalidone) .... Take 1/2 Tab Daily 7)  Labetalol Hcl 200 Mg Tabs  (Labetalol Hcl) .... Take One Tab At 10 A.m. & 10 P.m. 8)  Losartan Potassium 50 Mg Tabs (Losartan Potassium) .... Take 1 Tablet By Mouth Once A Day At 7 A.m. 9)  Minipress 1 Mg Caps (Prazosin Hcl) .... Take 1 Tablet By Mouth Two Times A Day  Allergies (verified): No Known Drug Allergies  Comments:  Nurse/Medical Assistant: The patient's medication list and allergies were reviewed with the patient and were updated in the Medication and Allergy Lists.  Past History:  Past Medical History: Last updated: 09/04/2010 SINUS BRADYCARDIA (ICD-427.81) HYPERTENSION, UNSPECIFIED (ICD-401.9), malignant ATRIAL FIBRILLATION (ICD-427.31) Coumadin therapy lower extremity weakness Bilateral renal artery stenosis, moderate. Question combination of athero and FMD    Past Surgical History: Last updated: 05/27/2009 Knee Arthroscopy  Family History: Last updated: 05/27/2009 Family History of Cancer:  CHF  Cirrhosis of liver  Social History: Last updated: 05/27/2009 Retired  Married  Tobacco Use - No.  Alcohol Use - no Regular Exercise - yes Drug Use - no  Risk Factors: Smoking Status: never (09/05/2010)  Review of Systems       The patient complains of fatigue and dizziness.  The patient denies malaise, fever, weight gain/loss, vision loss, decreased hearing, hoarseness, chest pain, palpitations, shortness of breath, prolonged cough, wheezing, sleep apnea, coughing up blood, abdominal pain, blood in stool, nausea, vomiting, diarrhea, heartburn, incontinence, blood in urine, muscle weakness, joint pain, leg swelling, rash, skin lesions, headache, fainting, depression, anxiety, enlarged lymph nodes,  easy bruising or bleeding, and environmental allergies.    Vital Signs:  Patient profile:   77 year old female Height:      63 inches Weight:      161 pounds Pulse rate:   59 / minute BP sitting:   159 / 122  (left arm) Cuff size:   regular  Vitals Entered By: Carlye Grippe (September 05, 2010 1:15 PM)  Serial Vital Signs/Assessments:  Time      Position  BP       Pulse  Resp  Temp     By 1:21 PM   R Arm     137/76                         Carlye Grippe                              [address]           L Arm     144/69                         Carlye Grippe                              59                    Carlye Grippe   Physical Exam  Additional Exam:  General: Well-developed, well-nourished in no distress head: Normocephalic and atraumatic eyes PERRLA/EOMI intact, conjunctiva and lids normal nose: No deformity or lesions mouth normal dentition, normal posterior pharynx neck: Supple, no JVD.  No masses, thyromegaly or abnormal cervical nodes lungs: Normal breath sounds bilaterally without wheezing.  Normal percussion heart: regular rate and rhythm with normal S1 and S2, no S3 or S4.  PMI is normal.  No pathological murmurs abdomen: Normal bowel sounds, abdomen is soft and nontender without masses, organomegaly or hernias noted.  No hepatosplenomegaly musculoskeletal: Back normal, normal gait muscle strength and tone normal pulsus: Pulse is normal in all 4 extremities Extremities: non pitting edema and varicosities. neurologic: Alert and oriented x 3, decreased strength in lower extremities skin: Intact without lesions or rashes cervical nodes: No significant adenopathy psychologic: Normal affect    Impression & Recommendations:  Problem # 1:  ATHEROSCLEROSIS OF RENAL ARTERY (ICD-440.1) no further plans for intervention  Problem # 2:  WEAKNESS (ICD-780.79) related to hypotension this morning and we stopped the patient's clonidine and cut back on her chlorthalidone 25 mg a day.  Problem # 3:  SINUS BRADYCARDIA (ICD-427.81) heartrate remained in the 60s to 70s.  The bradycardia is no significant problem at the present time. Her updated medication list for this problem includes:    Cartia Xt 240 Mg Xr24h-cap (Diltiazem hcl  coated beads) .Marland Kitchen... Take one tablet daily at noon    Coumadin 5 Mg Tabs (Warfarin sodium) ..... Use as directed    Labetalol Hcl 200 Mg Tabs (Labetalol hcl) .Marland Kitchen... Take one tab at 10 a.m. & 10 p.m.  Problem # 4:  ATRIAL FIBRILLATION (ICD-427.31) patient has a history of atrial fibrillation is currently normal sinus rhythm.  She will continue on Coumadin. Her updated medication list for this problem includes:  Coumadin 5 Mg Tabs (Warfarin sodium) ..... Use as directed    Labetalol Hcl 200 Mg Tabs (Labetalol hcl) .Marland Kitchen... Take one tab at 10 a.m. & 10 p.m.  Orders: EKG w/ Interpretation (93000)  Patient Instructions: 1)  Stop Clonidine 2)  Decrease Chlorthalidone to 25mg  daily 3)  Bring blood pressure readings to coumadin clinic monthly 4)  Follow up in  2 months

## 2010-09-11 NOTE — Assessment & Plan Note (Signed)
Summary: npv/ renal artery/ hypertension   Visit Type:  Initial Consult Primary Provider:  Vyas  CC:  NPV per Dr. Andee Lineman-.  History of Present Illness: 77 year-old woman referred for initial evaluation of renal artery stenosis. She has had hypertension since age 60, but reports refractory HTN over the past year. Reports predominately systolic hypertension with readings above 200 mmHg at times.  She was recently admitted at [facility] for recurrent epistaxis and severe HTN. The patient has required multiple antihypertensive medications. She currently denies headaches, visual changes, or chest pain. Main complaint is generalized fatigue.  During her recent hospital stay, she underwent an abdominal MRA and CT angiogram to assess for renal artery stenosis.  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 The single right renal artery was patent without stenosis and there were 2 left renal arteries, with the superior segment having a focal stenosis of possible hemodynamic significance. The inferior segment had no significant disease.  The CTA showed a 'less than 50%' stenosis of the right renal artery and an ostial plaque of the left superior renal artery of 'greater than 50%' with a normal left lower renal artery.    Current Medications (verified): 1)  Cartia Xt 240 Mg Xr24h-Cap (Diltiazem Hcl Coated Beads) .... Take One Tablet Daily At Noon 2)  Coumadin 5 Mg Tabs (Warfarin Sodium) .... Use As Directed 3)  Prilosec Otc 20 Mg Tbec (Omeprazole Magnesium) .... Take 1 Tablet By Mouth Once A Day As Needed 4)  Lovastatin 20 Mg Tabs (Lovastatin) .... Take 1 Tab By Mouth At Bedtime 5)  Clonidine Hcl 0.2 Mg/24hr Ptwk (Clonidine Hcl) .... Apply One Patch Every Week 6)  Glyburide-Metformin 2.5-500 Mg Tabs (Glyburide-Metformin) .... Take 1 Tablet By Mouth Once A Day 7)  Chlorthalidone 50 Mg  Tabs (Chlorthalidone) .... Take 1 Tablet By Mouth Once A Day At 7 A.m. 8)  Labetalol Hcl 200 Mg Tabs (Labetalol Hcl) .... Take One Tab At 10 A.m. & 10 P.m. 9)  Losartan Potassium 50 Mg Tabs (Losartan Potassium) .... Take 1 Tablet By Mouth Once A Day At 7 A.m. 10)  Minipress 1 Mg Caps (Prazosin Hcl) .... Take 1 Tablet By Mouth Two Times A Day  Allergies (verified): No Known Drug Allergies  Past History:  Past medical, surgical, family and social histories (including risk factors) reviewed, and no changes noted (except as noted below).  Past Medical History: SINUS BRADYCARDIA (ICD-427.81) HYPERTENSION, UNSPECIFIED (ICD-401.9), malignant ATRIAL FIBRILLATION (ICD-427.31) Coumadin therapy lower extremity weakness Bilateral renal artery stenosis, moderate. Question combination of athero and FMD    Past Surgical History: Reviewed history from 05/27/2009 and no changes required. Knee Arthroscopy  Family History: Reviewed history from 05/27/2009 and no changes required. Family History of Cancer:  CHF  Cirrhosis of liver  Social History: Reviewed history from 05/27/2009 and no changes required. Retired  Married  Tobacco Use - No.  Alcohol Use - no Regular Exercise - yes Drug Use - no  Review of Systems       Negative except as per HPI   Vital Signs:  Patient profile:   77 year old female Height:      63 inches Weight:      160.75 pounds BMI:     28.58 Pulse rate:   54 / minute Pulse rhythm:   regular Resp:     18 per minute BP sitting:   158 / 68  (  left arm) Cuff size:   large  Vitals Entered By: Vikki Ports (September 04, 2010 10:52 AM)  Serial Vital Signs/Assessments:  Time      Position  BP       Pulse  Resp  Temp     By           R Arm     162/66                         Vikki Ports   Physical Exam  General:  Pt is well-developed, alert and oriented, elderly woman in no acute distress HEENT: normal Neck: no thyromegaly           JVP normal, carotid  upstrokes normal without bruits Lungs: CTA Chest: equal expansion  CV: Apical impulse nondisplaced, RRR without murmur or gallop Abd: soft, NT, positive BS, no HSM, no bruit Back: no CVA tenderness Ext: no clubbing, cyanosis, or edema        femoral pulses 2+ without bruits        pedal pulses 2+ and equal Skin: warm, dry, no rash, few spider veins and varicosities in the legs Neuro: CNII-XII intact,strength 5/5 = b/l    Impression & Recommendations:  Problem # 1:  ATHEROSCLEROSIS OF RENAL ARTERY (ICD-440.1) Pt on 6 antihypertensive medications to control BP. She predominately has systolic HTN in the setting of advanced age. I have reviewed her CTA and MRA reports as well as the images. I think her overall degree of renal artery stenosis is at most moderate as the right renal and lower pole left renal artery are widely patent. The superior left renal artery does not appear critically stenosed. I would favor ongoing medical therapy as I doubt there is a causal relationship btwn the pt's renal artery stenosis and her malignant HTN. I reviewed this in detail with the patient and her husband and they agree with this plan. I would be happy to see her back in the future if progressive problems occur. She will followup with Dr Andee Lineman tomorrow for continued management of her HTN.  Patient Instructions: 1)  Your physician recommends that you schedule a follow-up appointment as needed.  2)  Your physician recommends that you continue on your current medications as directed. Please refer to the Current Medication list given to you today.

## 2010-09-11 NOTE — Letter (Signed)
Summary: [facility] D/C DR. VYAS  [facility] D[facility]C DR. VYAS   Imported By: Zachary George 09/05/2010 09:11:12  _____________________________________________________________________  External Attachment:    Type:   Image     Comment:   External Document

## 2010-09-11 NOTE — Miscellaneous (Signed)
Summary: rx - losartan  Clinical Lists Changes  Medications: Removed medication of EDARBI 80 MG TABS (AZILSARTAN MEDOXOMIL) Take 1 tablet by mouth once a day Rx of LOSARTAN POTASSIUM 50 MG TABS (LOSARTAN POTASSIUM) Take 1 tablet by mouth once a day at 7 a.m.;  #30 x 6;  Signed;  Entered by: Hoover Brunette, LPN;  Authorized by: Lewayne Bunting, MD, [facility];  Method used: Electronically [facility]  [address] [id number] [address] [place], [place]  [address], Ph: [phone], Fax: [phone]    Prescriptions: LOSARTAN POTASSIUM 50 MG TABS (LOSARTAN POTASSIUM) Take 1 tablet by mouth once a day at 7 a.m.  #30 x 6   Entered by:   Hoover Brunette, LPN   Authorized by:   Lewayne Bunting, MD, [facility]   Signed by:   Hoover Brunette, LPN on 65/78/4696   Method used:   [facility]  [address] [phone]* (retail)       [address]       [place], [place]  [address]       Ph: [phone]       Fax: [phone]   RxID:   [id number]

## 2010-09-11 NOTE — Consult Note (Signed)
Summary: CARDIOLOGY CONSULT/ [facility]  CARDIOLOGY CONSULT/ [facility]   Imported By: Zachary George 09/05/2010 09:14:40  _____________________________________________________________________  External Attachment:    Type:   Image     Comment:   External Document

## 2010-09-11 NOTE — Medication Information (Signed)
Summary: ccr-lr  Anticoagulant Therapy  Managed by: Vashti Hey, RN Referring MD: Jerral Bonito PCP: Lucianne Muss MD: Diona Browner MD, Remi Deter Indication 1: Atrial Fibrillation (ICD-427.31) Lab [facility] [place] Site: [place] INR POC 2.7  Dietary changes: no    Health status changes: no    Bleeding/hemorrhagic complications: no    Recent/future hospitalizations: no    Any changes in medication regimen? no    Recent/future dental: no  Any missed doses?: no       Is patient compliant with meds? yes       Allergies: No Known Drug Allergies  Anticoagulation Management History:      The patient is taking warfarin and comes in today for a routine follow up visit.  Positive risk factors for bleeding include an age of 75 years or older.  The bleeding index is 'intermediate risk'.  Positive CHADS2 values include History of HTN and Age > 59 years old.  The start date was 02/08/2007.  Anticoagulation responsible provider: Diona Browner MD, Remi Deter.  INR POC: 2.7.  Cuvette Lot#: [id number].  Exp: 10/11.    Anticoagulation Management Assessment/Plan:      The patient's current anticoagulation dose is Coumadin 5 mg tabs: use as directed.  The target INR is 2 - 3.  The next INR is due 09/09/2010.  Anticoagulation instructions were given to patient.  Results were reviewed/authorized by Vashti Hey, RN.  She was notified by Vashti Hey RN.         Prior Anticoagulation Instructions: INR 2.3 Continue coumadin 2.5mg  once daily except 5mg  on M,W,F  Current Anticoagulation Instructions: INR 2.7 Continue coumadin 2.5mg  once daily except 5mg  on M,W,F

## 2010-09-11 NOTE — Miscellaneous (Signed)
Summary: med update   Clinical Lists Changes  Medications: Changed medication from CARTIA XT 240 MG XR24H-CAP (DILTIAZEM HCL COATED BEADS) Take 1 tablet by mouth once a day to CARTIA XT 240 MG XR24H-CAP (DILTIAZEM HCL COATED BEADS) take one tablet daily at noon Changed medication from PRAZOSIN HCL 2 MG CAPS (PRAZOSIN HCL) Take 1 tablet by mouth two times a day to PRAZOSIN HCL 2 MG CAPS (PRAZOSIN HCL) take one cap at 7 a.m. & 7 p.m. Changed medication from CHLORTHALIDONE 50 MG TABS (CHLORTHALIDONE) Take 1 tablet by mouth once a day to CHLORTHALIDONE 50 MG TABS (CHLORTHALIDONE) Take 1 tablet by mouth once a day at 7 a.m. Added new medication of LOSARTAN POTASSIUM 50 MG TABS (LOSARTAN POTASSIUM) Take 1 tablet by mouth once a day at 7 a.m. Changed medication from LABETALOL HCL 200 MG TABS (LABETALOL HCL) Take 1 tablet by mouth two times a day to LABETALOL HCL 200 MG TABS (LABETALOL HCL) take one tab at 10 a.m. & 10 p.m.

## 2010-09-12 ENCOUNTER — Telehealth (INDEPENDENT_AMBULATORY_CARE_PROVIDER_SITE_OTHER): Payer: Self-pay | Admitting: *Deleted

## 2010-09-17 NOTE — Medication Information (Signed)
Summary: ccr-lr  Anticoagulant Therapy  Managed by: Vashti Hey, RN Referring MD: Jerral Bonito PCP: Lucianne Muss MD: Andee Lineman MD, Michelle Piper Indication 1: Atrial Fibrillation (ICD-427.31) Lab [facility] [place] Site: [place] INR POC 2.6  Dietary changes: no    Health status changes: no    Bleeding/hemorrhagic complications: no    Recent/future hospitalizations: no    Any changes in medication regimen? no    Recent/future dental: no  Any missed doses?: no       Is patient compliant with meds? yes       Allergies: No Known Drug Allergies  Anticoagulation Management History:      The patient is taking warfarin and comes in today for a routine follow up visit.  Positive risk factors for bleeding include an age of 75 years or older.  The bleeding index is 'intermediate risk'.  Positive CHADS2 values include History of HTN and Age > 60 years old.  The start date was 02/08/2007.  Anticoagulation responsible provider: Andee Lineman MD, Michelle Piper.  INR POC: 2.6.  Cuvette Lot#: [id number].  Exp: 10/11.    Anticoagulation Management Assessment/Plan:      The patient's current anticoagulation dose is Coumadin 5 mg tabs: use as directed.  The target INR is 2 - 3.  The next INR is due 10/07/2010.  Anticoagulation instructions were given to patient.  Results were reviewed/authorized by Vashti Hey, RN.  She was notified by Vashti Hey RN.         Prior Anticoagulation Instructions: INR 2.7 Continue coumadin 2.5mg  once daily except 5mg  on M,W,F  Current Anticoagulation Instructions: INR 2.6 Continue coumadin 2.5mg  once daily except 5mg  on M,W,F

## 2010-09-24 ENCOUNTER — Telehealth (INDEPENDENT_AMBULATORY_CARE_PROVIDER_SITE_OTHER): Payer: Self-pay | Admitting: *Deleted

## 2010-09-25 ENCOUNTER — Ambulatory Visit (INDEPENDENT_AMBULATORY_CARE_PROVIDER_SITE_OTHER): Payer: Medicare Other | Admitting: Cardiology

## 2010-09-25 ENCOUNTER — Encounter: Payer: Self-pay | Admitting: Cardiology

## 2010-09-25 DIAGNOSIS — I4891 Unspecified atrial fibrillation: Secondary | ICD-10-CM

## 2010-09-25 LAB — CONVERTED CEMR LAB
INR: 5.3
POC INR: 5.3

## 2010-09-25 NOTE — Progress Notes (Signed)
Summary: PATIENT STILL CONCERNED ABOUT LOW BP IN AM  Phone Note Call from Patient Call back at Home Phone [phone]   Caller: Patient Call For: nurse Summary of Call: patient left message on nurse voicemail that she is still having mornings where her BP is running too low she is holding medications,also feeling the weakness effect from low BP that was discussed in last office note.  BP running around 90/76 this morning when she got up, then 105/64. checked it again at 10am 123/78. Nurse advised patient that MD would be informed and that she needed to follow plan MD outlined at last visit,bring BP readings to [place] in a month and take meds at appropiate times MD gave to pateitn. Please advise. Initial call taken by: [name],  September 12, 2010 10:17 AM  Follow-up for Phone Call        tell her to stop her PM labetalol dosing.  If she still runs low in the morning she can try to stop her p.m. dosing of Minipress but then restart the p.m. dosing of labetalol.  She may need to just experiment with this and see.  Please contact the patient to try to do this Follow-up by: Karen Bunting, MD, [facility]  September 15, 2010 9:11 AM  Additional Follow-up for Phone Call Additional follow up Details #1::            Additional Follow-up for Phone Call Additional follow up Details #2::    [name] called this am wanting to know if Dr.Degent has made a decision about her medication. Please return call to her home #[phone]  Follow-up by: [name],  September 15, 2010 9:40 AM  Additional Follow-up for Phone Call Additional follow up Details #3:: Details for Additional Follow-up Action Taken: Patient informed of the above. Patient verbalized understanding of plan.  Additional Follow-up by: [name],  September 15, 2010 10:36 AM  New/Updated Medications: LABETALOL HCL 200 MG TABS (LABETALOL HCL) Take 1 tablet by mouth once a day at 10am

## 2010-09-26 ENCOUNTER — Encounter: Payer: Self-pay | Admitting: Cardiology

## 2010-09-26 DIAGNOSIS — I369 Nonrheumatic tricuspid valve disorder, unspecified: Secondary | ICD-10-CM

## 2010-09-30 DIAGNOSIS — R0602 Shortness of breath: Secondary | ICD-10-CM

## 2010-10-01 DIAGNOSIS — I059 Rheumatic mitral valve disease, unspecified: Secondary | ICD-10-CM

## 2010-10-01 NOTE — Progress Notes (Signed)
Summary: PHONE: BP ISSUES  Phone Note Call from Patient Call back at Home Phone [phone]   Caller: Patient Reason for Call: Acute Illness, Talk to Doctor Details for Reason: BP MEDICATIONS Summary of Call: Mrs. Jasmer called and states that he Bp medications are still not working properly.  BP 171/104 this am Patient states that she has quit taking some of her medications. She has become very weak and unable to walk at times. home # [id number]. Patient is reuqesting an appt with Dr. Andee Lineman ASAP so that he can sit down with her and figure out what to do about these medications. Initial call taken by: Zachary George,  September 24, 2010 10:35 AM  Follow-up for Phone Call        Informed patient that Dr. Andee Lineman could see her in the morning at 10:00.  Husband stated that he did not feel that she could wait till then and wants to come this afternoon.  Advised him that if she was truly feeling this bad that she may need to go to ED for evaluation.  Advised them that I would have to ask MD and will call them back as soon as I have a response.  Patient verbalized understanding.  Follow-up by: Hoover Brunette, LPN,  September 24, 2010 12:24 PM  Additional Follow-up for Phone Call Additional follow up Details #1::        Advised patient per Dr. Andee Lineman - okay to hold other doses of bp med tonight if continue to be low.  Feeling weak is coming from the low bp.  States that she has been having some pain in her back.  Advised her that she could take Tylenol for this, would be okay with other meds.  Advised her to bring all of her bp readings with her for the last week and all of her medications.  Advised her to go to ED  if symptoms worsen.  Patient verbalized understanding.  Additional Follow-up by: Hoover Brunette, LPN,  September 24, 2010 2:49 PM     Appended Document: PHONE: BP ISSUES OV scheduled for Thursday, 2/16 at 10:00 per Dr. Andee Lineman.

## 2010-10-01 NOTE — Consult Note (Signed)
Summary: Consultation Report/ CARDIOLOGY  Consultation Report/ CARDIOLOGY   Imported By: Dorise Hiss 09/26/2010 13:00:18  _____________________________________________________________________  External Attachment:    Type:   Image     Comment:   External Document

## 2010-10-01 NOTE — Letter (Signed)
Summary: Clincal Exam Notes: MRA Abdomen wo/ Contrast  Clincal Exam Notes: MRA Abdomen wo/ Contrast   Imported By: Earl Many 09/24/2010 18:24:33  _____________________________________________________________________  External Attachment:    Type:   Image     Comment:   External Document

## 2010-10-01 NOTE — Letter (Signed)
Summary: Clinical Exam Notes: CT Angio Abdomen  Clinical Exam Notes: CT Angio Abdomen   Imported By: Earl Many 09/24/2010 18:23:31  _____________________________________________________________________  External Attachment:    Type:   Image     Comment:   External Document

## 2010-10-01 NOTE — Consult Note (Signed)
[facility]   [facility]: Consultation   Imported By: Earl Many 09/24/2010 18:20:24  _____________________________________________________________________  External Attachment:    Type:   Image     Comment:   External Document

## 2010-10-03 DIAGNOSIS — I5033 Acute on chronic diastolic (congestive) heart failure: Secondary | ICD-10-CM

## 2010-10-07 ENCOUNTER — Encounter: Payer: Self-pay | Admitting: Cardiology

## 2010-10-07 ENCOUNTER — Encounter (INDEPENDENT_AMBULATORY_CARE_PROVIDER_SITE_OTHER): Payer: Medicare Other

## 2010-10-07 DIAGNOSIS — Z7901 Long term (current) use of anticoagulants: Secondary | ICD-10-CM

## 2010-10-07 DIAGNOSIS — I4891 Unspecified atrial fibrillation: Secondary | ICD-10-CM

## 2010-10-10 ENCOUNTER — Encounter: Payer: Self-pay | Admitting: Cardiology

## 2010-10-10 DIAGNOSIS — IMO0002 Reserved for concepts with insufficient information to code with codable children: Secondary | ICD-10-CM | POA: Insufficient documentation

## 2010-10-14 ENCOUNTER — Encounter (INDEPENDENT_AMBULATORY_CARE_PROVIDER_SITE_OTHER): Payer: Medicare Other

## 2010-10-14 ENCOUNTER — Encounter: Payer: Self-pay | Admitting: Cardiology

## 2010-10-14 DIAGNOSIS — Z7901 Long term (current) use of anticoagulants: Secondary | ICD-10-CM

## 2010-10-14 DIAGNOSIS — I4891 Unspecified atrial fibrillation: Secondary | ICD-10-CM

## 2010-10-16 ENCOUNTER — Telehealth: Payer: Self-pay | Admitting: *Deleted

## 2010-10-16 ENCOUNTER — Encounter: Payer: Self-pay | Admitting: *Deleted

## 2010-10-16 NOTE — Consult Note (Signed)
Summary: Consultation Report/  PROGRESS NOTE  Consultation Report/  PROGRESS NOTE   Imported By: Dorise Hiss 10/09/2010 15:01:09  _____________________________________________________________________  External Attachment:    Type:   Image     Comment:   External Document

## 2010-10-16 NOTE — Miscellaneous (Signed)
Summary: Orders Update - neurology  Clinical Lists Changes  Problems: Added new problem of COMPRESSION FRACTURE, SPINE (ICD-805.8) Orders: Added new Referral order of Neurology Referral (Neuro) - Signed

## 2010-10-16 NOTE — Miscellaneous (Signed)
Summary: rx - oxycodone  Clinical Lists Changes  Medications: Added new medication of OXYCODONE-ACETAMINOPHEN 5-325 MG TABS (OXYCODONE-ACETAMINOPHEN) may take one tab every 4-6 hours as needed pain - Signed Rx of OXYCODONE-ACETAMINOPHEN 5-325 MG TABS (OXYCODONE-ACETAMINOPHEN) may take one tab every 4-6 hours as needed pain;  #60 x 0;  Signed;  Entered by: Hoover Brunette, LPN;  Authorized by: Lewayne Bunting, MD, [facility];  Method used: Print then Give to Patient    Prescriptions: OXYCODONE-ACETAMINOPHEN 5-325 MG TABS (OXYCODONE-ACETAMINOPHEN) may take one tab every 4-6 hours as needed pain  #60 x 0   Entered by:   Hoover Brunette, LPN   Authorized by:   Lewayne Bunting, MD, [facility]   Signed by:   Hoover Brunette, LPN on 16/05/9603   Method used:   Print then Give to Patient   RxID:   [id number]

## 2010-10-16 NOTE — Medication Information (Signed)
Summary: ccr-lr  Anticoagulant Therapy  Managed by: Vashti Hey, RN Referring MD: Jerral Bonito PCP: Lucianne Muss MD: Andee Lineman MD, Michelle Piper Indication 1: Atrial Fibrillation (ICD-427.31) [facility] [place] Site: [place] INR POC 2.7  Vital Signs: Pulse Rate: 73  Rhythm: irregular  Respirations: 20  Blood Pressure:  109 / 72   Dietary changes: no    Health status changes: no    Bleeding/hemorrhagic complications: no    Recent/future hospitalizations: yes       Details: In [facility] 11 days  D/C yesterday 2/27 for atrial fib, leaking valve, disc  Any changes in medication regimen? no    Recent/future dental: no  Any missed doses?: no       Is patient compliant with meds? yes       Allergies: No Known Drug Allergies  Anticoagulation Management History:      The patient is taking warfarin and comes in today for a routine follow up visit.  Positive risk factors for bleeding include an age of 21 years or older.  The bleeding index is 'intermediate risk'.  Positive CHADS2 values include History of HTN and Age > 7 years old.  The start date was 02/08/2007.  Her last INR was 5.3.  Anticoagulation responsible provider: Andee Lineman MD, Michelle Piper.  INR POC: 2.7.  Cuvette Lot#: [id number].  Exp: 10/11.    Anticoagulation Management Assessment/Plan:      The patient's current anticoagulation dose is Coumadin 5 mg tabs: use as directed.  The target INR is 2 - 3.  The next INR is due 10/14/2010.  Anticoagulation instructions were given to patient.  Results were reviewed/authorized by Vashti Hey, RN.  She was notified by Vashti Hey RN.        Coagulation management information includes: 10/07/10 Pending DCCV.  Prior Anticoagulation Instructions: INR 2.6 Continue coumadin 2.5mg  once daily except 5mg  on M,W,F  Current Anticoagulation Instructions: INR 2.7  (1st preDCCV) Continue coumadin 2.5mg  once daily except 5mg  on Mondays, Wednesdays and Fridays Prescriptions: COUMADIN 5 MG TABS (WARFARIN  SODIUM) use as directed  #45 x 3   Entered by:   Vashti Hey RN   Authorized by:   Lewayne Bunting, MD, [facility]   Signed by:   Vashti Hey RN on 10/07/2010   Method used:   Electronically to        [facility]  [address] [phone]* (retail)       [address]       [place], [place]  [address]       Ph: [phone]       Fax: [phone]   RxID:   [id number]

## 2010-10-16 NOTE — Assessment & Plan Note (Signed)
Summary: blood pressure issues  --agh   Visit Type:  Follow-up Primary Provider:  Sherril Croon   History of Present Illness: this note was done in the emergency room.  Preventive Screening-Counseling & Management  Alcohol-Tobacco     Smoking Status: never  Current Medications (verified): 1)  Cartia Xt 240 Mg Xr24h-Cap (Diltiazem Hcl Coated Beads) .... Take One Tablet Daily At Noon 2)  Coumadin 5 Mg Tabs (Warfarin Sodium) .... Use As Directed 3)  Prilosec Otc 20 Mg Tbec (Omeprazole Magnesium) .... Take 1 Tablet By Mouth Once A Day As Needed 4)  Lovastatin 20 Mg Tabs (Lovastatin) .... Take 1 Tab By Mouth At Bedtime 5)  Glyburide-Metformin 2.5-500 Mg Tabs (Glyburide-Metformin) .... Take 1 Tablet By Mouth Once A Day 6)  Chlorthalidone 50 Mg Tabs (Chlorthalidone) .... Take 1/2 Tab Daily 7)  Labetalol Hcl 200 Mg Tabs (Labetalol Hcl) .... Take 1 Tablet By Mouth Once A Day At 10am 8)  Losartan Potassium 50 Mg Tabs (Losartan Potassium) .... Take 1 Tablet By Mouth Once A Day At 7 A.m. 9)  Minipress 1 Mg Caps (Prazosin Hcl) .... Take 1 Tablet By Mouth Two Times A Day  Allergies (verified): No Known Drug Allergies  Comments:  Nurse/Medical Assistant: The patient's medications and allergies were reviewed with the patient and were updated in the Medication and Allergy Lists. Tammi Romine CMA (September 25, 2010 9:58 AM)  Past History:  Past Medical History: Last updated: 09/04/2010 SINUS BRADYCARDIA (ICD-427.81) HYPERTENSION, UNSPECIFIED (ICD-401.9), malignant ATRIAL FIBRILLATION (ICD-427.31) Coumadin therapy lower extremity weakness Bilateral renal artery stenosis, moderate. Question combination of athero and FMD    Vital Signs:  Patient profile:   77 year old female Height:      63 inches Weight:      163 pounds BMI:     28.98 Pulse rate:   76 / minute BP sitting:   149 / 114  (right arm) Cuff size:   regular  Vitals Entered By: Fuller Plan CMA (September 25, 2010 9:58  AM)   Other Orders: Protime INR (51761) EMR miscellaneous medications [facility])  Anticoagulant Therapy  Managed by: Vashti Hey, RN Referring MD: Jerral Bonito PCP: Lucianne Muss MD: Andee Lineman MD, Michelle Piper Indication 1: Atrial Fibrillation (ICD-427.31) Lab [facility] [place] Site: [place] INR POC 5.3  Vital Signs: Weight: 163 lbs.  Pulse Rate: 76  Blood Pressure:  149 / 114             Laboratory Results   Blood Tests   Date/Time Recieved: September 25, 2010 10:45 AM  Date/Time Reported: September 25, 2010 10:45 AM    INR: 5.3   (Normal Range: 0.88-1.12   Therap INR: 2.0-3.5)      Medication Administration  Medication # 1:    Medication: metoprolol tartrate 25mg     Diagnosis: ATRIAL FIBRILLATION (ICD-427.31)    Dose: 50mg     Route: po    Exp Date: 12/17/2010    Lot #: [id number]    Mfr: Mylan    Comments: 50mg  metoprolol tartrate given to patient    Patient tolerated medication without complications    Given by: Carlye Grippe (September 25, 2010 10:28 AM)  Medication # 2:    Medication: nitroglycerin 0.4mg /hr    Diagnosis: chest pain    Dose: 0.4mg     Route: topical    Exp Date: 04/11/2011    Lot #: [id number]    Mfr: Mylan    Comments: patch applied to right arm    Patient  tolerated medication without complications    Given by: Carlye Grippe (September 25, 2010 10:28 AM)  Orders Added: 1)  Protime INR [85610] 2)  EMR miscellaneous medications [EMRORAL]

## 2010-10-16 NOTE — Letter (Signed)
Summary: External Correspondence/ FAXED REFERRAL [facility]  External Correspondence/ FAXED RE[facility]ERR[facility]   Imported By: Dorise Hiss 10/10/2010 16:19:25  _____________________________________________________________________  External Attachment:    Type:   Image     Comment:   External Document

## 2010-10-21 ENCOUNTER — Encounter: Payer: Self-pay | Admitting: Cardiology

## 2010-10-21 ENCOUNTER — Telehealth: Payer: Self-pay | Admitting: Cardiology

## 2010-10-21 LAB — CONVERTED CEMR LAB: POC INR: 2.1

## 2010-10-21 NOTE — Medication Information (Signed)
Summary: ccr-lr  Anticoagulant Therapy  Managed by: Vashti Hey, RN Referring MD: Jerral Bonito PCP: Lucianne Muss MD: Andee Lineman MD, Michelle Piper Indication 1: Atrial Fibrillation (ICD-427.31) Lab Used: [facility] [place] Site: [place] INR POC 3.1           Allergies: No Known Drug Allergies  Anticoagulation Management History:      The patient is taking warfarin and comes in today for a routine follow up visit.  Positive risk factors for bleeding include an age of 75 years or older.  The bleeding index is 'intermediate risk'.  Positive CHADS2 values include History of HTN and Age > 75 years old.  The start date was 02/08/2007.  Her last INR was 5.3.  Anticoagulation responsible provider: Andee Lineman MD, Michelle Piper.  INR POC: 3.1.  Cuvette Lot#: [id number].  Exp: 10/11.    Anticoagulation Management Assessment/Plan:      The patient's current anticoagulation dose is Coumadin 5 mg tabs: use as directed.  The target INR is 2 - 3.  The next INR is due 10/21/2010.  Anticoagulation instructions were given to patient.  Results were reviewed/authorized by Vashti Hey, RN.  She was notified by Vashti Hey RN.         Prior Anticoagulation Instructions: INR 2.7  (1st preDCCV) Continue coumadin 2.5mg  once daily except 5mg  on Mondays, Wednesdays and Fridays  Current Anticoagulation Instructions: INR 3.1 (2nd pre DCCV) Continue coumadin 2.5mg  once daily except 5mg  on M,W,F [facility] to recheck INR on 10/21/10

## 2010-10-28 ENCOUNTER — Encounter: Payer: Self-pay | Admitting: Cardiology

## 2010-10-28 DIAGNOSIS — I4891 Unspecified atrial fibrillation: Secondary | ICD-10-CM

## 2010-10-28 DIAGNOSIS — Z7901 Long term (current) use of anticoagulants: Secondary | ICD-10-CM

## 2010-10-28 NOTE — Progress Notes (Signed)
Summary: voicemail message - back   Phone Note Call from Patient   Summary of Call: Sent to Dr. Channing Mutters about vertebrae in back, met with him yesterday.  Not sure that he does anything else on the back besides surgery.  Wants to speak to MD to see if she needs to be referred to anybody else.  Wants MD opinion. Initial call taken by: Hoover Brunette, LPN,  October 16, 2010 11:17 AM  Follow-up for Phone Call        she should Dr. Channing Mutters ask for alternatives Follow-up by: Lewayne Bunting, MD, [facility],  October 20, 2010 4:32 PM  Additional Follow-up for Phone Call Additional follow up Details #1::        Patient notified.    Additional Follow-up by: Hoover Brunette, LPN,  October 21, 2010 11:19 AM

## 2010-10-28 NOTE — Miscellaneous (Signed)
Summary: Home Care Report/  [facility]  Home Care Report/  [facility]   Imported By: Dorise Hiss 10/20/2010 09:50:10  _____________________________________________________________________  External Attachment:    Type:   Image     Comment:   External Document

## 2010-10-28 NOTE — Progress Notes (Signed)
Summary: coumadin management  Phone Note Other Incoming   Caller: Kim with [facility] Reason for Call: Discuss lab or test results Summary of Call: Called with results of PT/INR obtained on pt today.  PT 25.1  INR 2.1  Order given for pt to take coumadin 5mg  tonight then resume 2.5mg  once daily except 5mg  on M,W,F and recheck INR on 10/28/10. Initial call taken by: Vashti Hey RN,  October 21, 2010 2:27 PM     Anticoagulant Therapy  Managed by: Vashti Hey, RN Referring MD: Jerral Bonito PCP: Lucianne Muss MD: Andee Lineman MD, Michelle Piper Indication 1: Atrial Fibrillation (ICD-427.31) Lab Used: [facility] Site: [place] PT 25.1 INR POC 2.1  Dietary changes: no    Health status changes: no    Bleeding/hemorrhagic complications: no    Recent/future hospitalizations: yes       Details: Pending DCCV  Any changes in medication regimen? no    Recent/future dental: no  Any missed doses?: no       Is patient compliant with meds? yes         Anticoagulation Management History:      Her anticoagulation is being managed by telephone today.  Positive risk factors for bleeding include an age of 75 years or older.  The bleeding index is 'intermediate risk'.  Positive CHADS2 values include History of HTN and Age > 7 years old.  The start date was 02/08/2007.  Her last INR was 5.3.  Prothrombin time is 25.1.  Anticoagulation responsible provider: Andee Lineman MD, Michelle Piper.  INR POC: 2.1.  Exp: 10/11.    Anticoagulation Management Assessment/Plan:      The patient's current anticoagulation dose is Coumadin 5 mg tabs: use as directed.  The target INR is 2 - 3.  The next INR is due 10/28/2010.  Anticoagulation instructions were given to [facility] @ [facility].  Results were reviewed/authorized by Vashti Hey, RN.  She was notified by Selena Batten @ [facility].         Prior Anticoagulation Instructions: INR 3.1 (2nd pre DCCV) Continue coumadin 2.5mg  once daily except 5mg  on M,W,F [facility] to recheck INR on 10/21/10  Current  Anticoagulation Instructions: INR 2.1  (3rd pre DCCV) Called with results of PT/INR obtained on pt today.  PT 25.1  INR 2.1  Order given for pt to take coumadin 5mg  tonight then resume 2.5mg  once daily except 5mg  on M,W,F and recheck INR on 10/28/10.

## 2010-10-28 NOTE — Miscellaneous (Signed)
Summary: Home Care Report/  [facility]  Home Care Report/  [facility]   Imported By: Dorise Hiss 10/20/2010 10:06:57  _____________________________________________________________________  External Attachment:    Type:   Image     Comment:   External Document

## 2010-10-30 ENCOUNTER — Ambulatory Visit (INDEPENDENT_AMBULATORY_CARE_PROVIDER_SITE_OTHER): Payer: Medicare Other | Admitting: *Deleted

## 2010-10-30 DIAGNOSIS — Z7901 Long term (current) use of anticoagulants: Secondary | ICD-10-CM

## 2010-10-30 DIAGNOSIS — I4891 Unspecified atrial fibrillation: Secondary | ICD-10-CM

## 2010-11-03 ENCOUNTER — Telehealth: Payer: Self-pay | Admitting: Cardiology

## 2010-11-03 NOTE — Telephone Encounter (Signed)
PT - 38.0 and INR - 3.2 / please call Carollee Herter at above number with instructions or the office @ [phone] / tg

## 2010-11-04 ENCOUNTER — Ambulatory Visit (INDEPENDENT_AMBULATORY_CARE_PROVIDER_SITE_OTHER): Payer: Medicare Other | Admitting: Cardiology

## 2010-11-04 ENCOUNTER — Encounter: Payer: Self-pay | Admitting: Cardiology

## 2010-11-04 VITALS — BP 109/66 | HR 62 | Ht 64.0 in | Wt 157.0 lb

## 2010-11-04 DIAGNOSIS — I340 Nonrheumatic mitral (valve) insufficiency: Secondary | ICD-10-CM | POA: Insufficient documentation

## 2010-11-04 DIAGNOSIS — I10 Essential (primary) hypertension: Secondary | ICD-10-CM

## 2010-11-04 DIAGNOSIS — I5033 Acute on chronic diastolic (congestive) heart failure: Secondary | ICD-10-CM | POA: Insufficient documentation

## 2010-11-04 DIAGNOSIS — I4891 Unspecified atrial fibrillation: Secondary | ICD-10-CM

## 2010-11-04 DIAGNOSIS — Q211 Atrial septal defect: Secondary | ICD-10-CM

## 2010-11-04 DIAGNOSIS — N289 Disorder of kidney and ureter, unspecified: Secondary | ICD-10-CM

## 2010-11-04 DIAGNOSIS — I420 Dilated cardiomyopathy: Secondary | ICD-10-CM | POA: Insufficient documentation

## 2010-11-04 DIAGNOSIS — I428 Other cardiomyopathies: Secondary | ICD-10-CM

## 2010-11-04 DIAGNOSIS — I059 Rheumatic mitral valve disease, unspecified: Secondary | ICD-10-CM

## 2010-11-04 DIAGNOSIS — IMO0002 Reserved for concepts with insufficient information to code with codable children: Secondary | ICD-10-CM

## 2010-11-04 DIAGNOSIS — Z7901 Long term (current) use of anticoagulants: Secondary | ICD-10-CM

## 2010-11-04 LAB — POCT INR: INR: 3.2

## 2010-11-04 MED ORDER — AMIODARONE HCL 400 MG PO TABS: 400.0000 mg | ORAL_TABLET | Freq: Every day | ORAL | Status: DC

## 2010-11-04 NOTE — Assessment & Plan Note (Signed)
Coumadin will need to be followed closely now that the patient has been started on amiodarone and will need to be followed up weekly

## 2010-11-04 NOTE — Assessment & Plan Note (Signed)
Controlled on her current medical regimen.

## 2010-11-04 NOTE — Assessment & Plan Note (Signed)
Patient will followup in [facility] for consideration of kyphoplasty

## 2010-11-04 NOTE — Assessment & Plan Note (Signed)
Will obtain a followup electrolyte panel, as well as a BNP level. Of note is that the patient is currently not on Lasix.

## 2010-11-04 NOTE — Assessment & Plan Note (Signed)
failed ibutilide and flecainide: Start amiodarone 400 monos p.o. daily x 4 weeks then decrease to 200 monos p.o. daily and Consider cardioversion after 4 weeks of amiodarone.  I carefully explained to the patient That her heart rate may be decreased With  amiodarone as well as the effect on the PT/INR.  She will monitor her heart rate we will bring her for a nurse visit on Friday for an EKG and make sure that her heart rate is between those parameters.  Will need monitoring of LFTs thyroid function tests and Pulmonary function tests in the future with amiodarone.

## 2010-11-04 NOTE — Telephone Encounter (Signed)
Spoke with Carollee Herter at [facility].  They will discharge pt and pt will have follow up INR visits in office.  Instructions given to pt.  She verbalized understanding.  See coumadin note.

## 2010-11-04 NOTE — Progress Notes (Signed)
HPI The patient is a 77 year old female was originally admitted from the office on 216 2012 when she presented in the office with atrial fibrillation with rapid ventricular response.  The patient was on flecainide therapy.  She has no history of ischemic heart disease with normal LV function.  Chest history of renal artery stenosis which is treated medically.  She also is over anticoagulated with an INR of 5.4. The patient a prolonged hospital course and initially received ibutilide and restored normal sinus rhythm.  She was then continued on flecainide maintained in normal sinus rhythm but then reverted back to atrial fibrillation with rate control.  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 A transesophageal echocardiogram was performed which showed an ejection fraction of 60 to 65% left atrial enlargement and mild to moderate mitral regurgitation.  She also the small PFO with bidirectional shunting. Eventually the patient was able to be discharged with improvement in her renal function, her blood pressure under control and back on chronic Coumadin.  During hospitalization she also developed acute on chronic diastolic heart failure which was improved. Our plan on discharge was to discontinue flecainide because the patient was not maintaining normal sinus rhythm.  The plan was to start amiodarone as an outpatient and then bring the patient back after 4 weeks anticoagulation and antiarrhythmic therapy followed by cardioversion as an outpatient. The patient also was up supposed to get follow-up labs as well as follow INR .Marland Kitchen on her current electrocardiogram the patient remains in atrial fibrillation with rate control heart rate 83 beats/min. the patient was initially discharged on flexion night by her primary care physician the patient is currently not taking this as per  our instructions.  Her blood pressure is well controlled on her current medical regimen. The patient overall has been doing well since hospital discharge.  She is feeling better in regards to shortness of breath, she has no chest pain she has no orthopnea PND.  Her biggest complaint however is worsening weakness and lack of energy.  The patient feels fatigued most of the time tickling the morning.  She denies however any palpitations presyncope or syncope.  She showed me records of her blood pressure and heart rate and her blood pressure is been very well controlled anywhere from hundred and 10 mm of mercury the hundred and 29 mm of mercury systolic with a diastolic blood pressure all 63 to 78 mm of mercury.  Heartrate is also been controlled anywhere between 8212 beats/min.  No Known Allergies  Current Outpatient Prescriptions on File Prior to Visit  Medication Sig Dispense Refill  . diltiazem (CARDIZEM CD) 240 MG 24 hr capsule Take 240 mg by mouth daily.       Marland Kitchen glyBURIDE-metformin (GLUCOVANCE) 2.5-500 MG per tablet Take 1 tablet by mouth daily with breakfast.        . labetalol (NORMODYNE) 200 MG tablet Take 1/2 by mouth twice daily       . lovastatin (MEVACOR) 20 MG tablet Take 20 mg by mouth at bedtime.        Marland Kitchen omeprazole (PRILOSEC OTC) 20 MG tablet Take 20 mg by mouth daily.        Marland Kitchen oxyCODONE-acetaminophen (PERCOCET) 5-325 MG per tablet Take 1 tablet by mouth every 4 (four) hours as needed.        . prazosin (MINIPRESS) 1 MG capsule Take one by mouth twice daily        .  warfarin (COUMADIN) 5 MG tablet Use as directed       . DISCONTD: chlorthalidone (HYGROTON) 50 MG tablet Take 1/2 by mouth daily         Past Medical History  Diagnosis Date  . Sinoatrial node dysfunction      SINUS BRADYCARDIA  . Hypertension   . Atrial fibrillation   . Encounter for long-term (current) use of other medications     COUMADIN THERAPY  . Lower extremity weakness   . Bilateral renal artery stenosis       MODERATE. QUESTION COMBINATION OF ATHERO AND FMD    Past Surgical History  Procedure Date  . Knee arthroscopy     Family History  Problem Relation Age of Onset  . Cancer Other   . Heart failure Other   . Cirrhosis Other     History   Social History  . Marital Status: Married    Spouse Name: N/A    Number of Children: N/A  . Years of Education: N/A   Occupational History  . RETIRED    Social History Main Topics  . Smoking status: Never Smoker   . Smokeless tobacco: Not on file  . Alcohol Use: No  . Drug Use: No  . Sexually Active:    Other Topics Concern  . Not on file   Social History Narrative  . No narrative on file   Review of systems:The patient is less short of breath. However she still is weakness and lethargy. She She is tired easily. She has less energy. The remainder of 18.Point review of systemsis negative.  PHYSICAL EXAM BP 109/66  Pulse 62  Ht 5\' 4"  (1.626 m)  Wt 157 lb (71.215 kg)  BMI 26.95 kg/m2  General: Well-developed, well-nourished in no distress Head: Normocephalic and atraumatic Eyes:PERRLA/EOMI intact, conjunctiva and lids normal Ears: No deformity or lesions Mouth:normal dentition, normal posterior pharynx Neck: Supple, no JVD.  No masses, thyromegaly or abnormal cervical nodes Lungs: Normal breath sounds bilaterally without wheezing.  Normal percussion Cardiac:Irregularand rhythm with normal S1 and S2, no S3 or S4.  PMI is normal.  No pathological murmurs Abdomen: Normal bowel sounds, abdomen is soft and nontender without masses, organomegaly or hernias noted.  No hepatosplenomegaly MSK: Back normal, normal gait muscle strength and tone normal Vascular: Pulse is normal in all 4 extremities Extremities: No peripheral pitting edema Neurologic: Alert and oriented x 3 Skin: Intact without lesions or rashes Lymphatics: No significant adenopathyPsychologic: Normal affect   ZOX:WRUEAV fibrillation with nonspecific ST segment  changes  ASSESSMENT AND PLAN

## 2010-11-04 NOTE — Assessment & Plan Note (Signed)
now normal ejection fraction: We will obtain a followup echocardiogram at her next clinic visit.

## 2010-11-04 NOTE — Patient Instructions (Signed)
Labs today:  BMET, BNP, TSH Friday, 4/30 - need to have nurse visit for EKG & vitals and coumadin check with Misty Stanley  Amiodarone 400mg  daily x 4 weeks, then 200mg  daily  Will plan for cardioversion after 4 weeks of Amiodarone Follow up in  2 months

## 2010-11-07 ENCOUNTER — Encounter: Payer: Self-pay | Admitting: *Deleted

## 2010-11-07 ENCOUNTER — Ambulatory Visit (INDEPENDENT_AMBULATORY_CARE_PROVIDER_SITE_OTHER): Payer: Medicare Other | Admitting: *Deleted

## 2010-11-07 DIAGNOSIS — Z7901 Long term (current) use of anticoagulants: Secondary | ICD-10-CM

## 2010-11-07 DIAGNOSIS — I472 Ventricular tachycardia: Secondary | ICD-10-CM

## 2010-11-07 DIAGNOSIS — I10 Essential (primary) hypertension: Secondary | ICD-10-CM

## 2010-11-07 DIAGNOSIS — I4891 Unspecified atrial fibrillation: Secondary | ICD-10-CM

## 2010-11-07 LAB — POCT INR: INR: 2.6

## 2010-11-07 NOTE — Progress Notes (Signed)
Patient did state that she did not take her Amiodarone this morning because her heart rate was 120.  She was then advised to take this on a daily basis because that is what that med was for (antiarrhythmic).  Patient stated she did feel good otherwise. EKG scanned in for your review.

## 2010-11-13 ENCOUNTER — Ambulatory Visit (INDEPENDENT_AMBULATORY_CARE_PROVIDER_SITE_OTHER): Payer: Medicare Other | Admitting: *Deleted

## 2010-11-13 DIAGNOSIS — I4891 Unspecified atrial fibrillation: Secondary | ICD-10-CM

## 2010-11-13 DIAGNOSIS — Z7901 Long term (current) use of anticoagulants: Secondary | ICD-10-CM

## 2010-11-13 LAB — POCT INR: INR: 3.1

## 2010-11-18 ENCOUNTER — Encounter: Payer: Medicare Other | Admitting: *Deleted

## 2010-11-20 ENCOUNTER — Encounter: Payer: Self-pay | Admitting: Cardiology

## 2010-11-20 ENCOUNTER — Telehealth: Payer: Self-pay | Admitting: *Deleted

## 2010-11-20 NOTE — Telephone Encounter (Signed)
Last seen on 3/27, started on Amiodarone 400mg  daily.  Now c/o nausea & weakness since starting.  Has had some weakness since coming home from hospital, but does seem to be more since adding new med.  A few mornings she has had vomiting also due to feeling sooo nauseated.

## 2010-11-21 ENCOUNTER — Ambulatory Visit (INDEPENDENT_AMBULATORY_CARE_PROVIDER_SITE_OTHER): Payer: Medicare Other | Admitting: *Deleted

## 2010-11-21 ENCOUNTER — Other Ambulatory Visit: Payer: Self-pay | Admitting: *Deleted

## 2010-11-21 DIAGNOSIS — Z7901 Long term (current) use of anticoagulants: Secondary | ICD-10-CM

## 2010-11-21 DIAGNOSIS — I4891 Unspecified atrial fibrillation: Secondary | ICD-10-CM

## 2010-11-21 LAB — POCT INR: INR: 3.1

## 2010-11-21 NOTE — Telephone Encounter (Signed)
Decrease amiodarone to 200 mg by mouth daily. If she still feels nauseated with this she can change amiodarone to 100 mg mg by mouth twice a day. Due those change t the patient will need to be seen next week in the Coumadin clinic to adjust her Coumadin. Get an electrocardiogram at that point in time and see if he still needed to be cardioverted. She should be on schedule for an elective cardioversion.

## 2010-11-21 NOTE — Telephone Encounter (Signed)
Patient notified of results. Patient verbalized understanding

## 2010-11-28 ENCOUNTER — Ambulatory Visit (INDEPENDENT_AMBULATORY_CARE_PROVIDER_SITE_OTHER): Payer: Medicare Other | Admitting: *Deleted

## 2010-11-28 DIAGNOSIS — I4891 Unspecified atrial fibrillation: Secondary | ICD-10-CM

## 2010-11-28 DIAGNOSIS — Z7901 Long term (current) use of anticoagulants: Secondary | ICD-10-CM

## 2010-11-28 NOTE — Progress Notes (Signed)
Patient advised that ekg still shows atrial fibrillation with rate control. MD reviewed ekg. Patient will still need DCCV and will be contacted by nurse later r/e cardioversion.

## 2010-12-03 ENCOUNTER — Encounter: Payer: Self-pay | Admitting: *Deleted

## 2010-12-04 ENCOUNTER — Other Ambulatory Visit: Payer: Self-pay | Admitting: *Deleted

## 2010-12-04 DIAGNOSIS — I4891 Unspecified atrial fibrillation: Secondary | ICD-10-CM

## 2010-12-04 DIAGNOSIS — Z01810 Encounter for preprocedural cardiovascular examination: Secondary | ICD-10-CM

## 2010-12-05 ENCOUNTER — Encounter: Payer: Medicare Other | Admitting: *Deleted

## 2010-12-09 ENCOUNTER — Ambulatory Visit (INDEPENDENT_AMBULATORY_CARE_PROVIDER_SITE_OTHER): Payer: Medicare Other | Admitting: *Deleted

## 2010-12-09 ENCOUNTER — Encounter: Payer: Medicare Other | Admitting: *Deleted

## 2010-12-09 DIAGNOSIS — I4891 Unspecified atrial fibrillation: Secondary | ICD-10-CM

## 2010-12-09 DIAGNOSIS — Z7901 Long term (current) use of anticoagulants: Secondary | ICD-10-CM

## 2010-12-11 ENCOUNTER — Other Ambulatory Visit: Payer: Self-pay | Admitting: *Deleted

## 2010-12-11 DIAGNOSIS — I4891 Unspecified atrial fibrillation: Secondary | ICD-10-CM

## 2010-12-18 ENCOUNTER — Telehealth: Payer: Self-pay | Admitting: *Deleted

## 2010-12-18 NOTE — Telephone Encounter (Signed)
Message copied by Hoover Brunette on Thu Dec 18, 2010  4:37 PM ------      Message from: Lewayne Bunting      Created: Sat Dec 13, 2010  5:56 AM       Renal function significantly worsened. Would hold losartan for now. Refer patient to nephrology. She also has history of renal artery stenosis, contributing to renal dysfunction. Refer to dr. Excell Seltzer or [facility] for evaluation of renal artery stenosis. Recheck BMET one week after holding losartan and continue to monitor BP at home. SBP acceptable for now if not higher then 150-160 mmHG systolic.

## 2010-12-18 NOTE — Telephone Encounter (Signed)
Patient notified of below.  States she still is having some nausea even after decrease of Amiodarone.  Does already have follow up visit scheduled for 6/4 with GD.

## 2010-12-19 ENCOUNTER — Other Ambulatory Visit: Payer: Self-pay | Admitting: *Deleted

## 2010-12-19 ENCOUNTER — Ambulatory Visit (INDEPENDENT_AMBULATORY_CARE_PROVIDER_SITE_OTHER): Payer: Medicare Other | Admitting: *Deleted

## 2010-12-19 DIAGNOSIS — Z7901 Long term (current) use of anticoagulants: Secondary | ICD-10-CM

## 2010-12-19 DIAGNOSIS — I4891 Unspecified atrial fibrillation: Secondary | ICD-10-CM

## 2010-12-19 DIAGNOSIS — N289 Disorder of kidney and ureter, unspecified: Secondary | ICD-10-CM

## 2010-12-20 NOTE — Telephone Encounter (Signed)
At this point I would address nausea with primary care physician.  Patient would also benefit from GI referral.

## 2010-12-22 ENCOUNTER — Encounter: Payer: Self-pay | Admitting: Cardiology

## 2010-12-23 NOTE — Assessment & Plan Note (Signed)
[facility]                          [facility]   [name], [name]        MRN:          [id number]  DATE:02/28/2007                            DOB:          Dec 19, 1928    PRIMARY CARDIOLOGIST:  Learta Codding, MD,FACC   REASON FOR VISIT:  Scheduled clinic follow up.  Please refer to [facility] office note of January 20, 2007, for full details.   When last seen here in the clinic, consideration at that time was to  consider DC cardioversion if the patient remained in atrial  fibrillation.  She was to have had weekly pro times monitored through  Dr. Magnus Ivan office.  However, she informs me today that these were not  done on a weekly basis.   Clinically, the patient denies any palpitations or fluttering sensation.  She denies any chest pain.  She does, however, complain of bilateral  lower extremity weakness with no associated muscle discomfort.  She also  has some chronic pain of the right hip.  She denies any prior formal  evaluation for this.   Electrocardiogram today reveals atrial fibrillation at 101 beats per  minute with nonspecific ST abnormalities.   CURRENT MEDICATIONS:  1. Welchol 625 mg three tablets b.i.d.  2. Prilosec OTC 20 daily.  3. Metoprolol ER 100 daily.  4. Cartia XT 240 daily.  5. Diovan HCT 160/25 mg daily.  6. Coumadin 5 mg as directed.   PHYSICAL EXAMINATION:  VITAL SIGNS:  Blood pressure 126/75, pulse 98 and  irregular.  Weight 170.  GENERAL:  A 77 year old female sitting upright in no distress.  HEENT:  Normocephalic and atraumatic.  NECK:  Palpable bilateral carotid pulses without bruits.  No JVD.  LUNGS:  Clear to auscultation in all fields.  ABDOMEN:  Benign.  EXTREMITIES:  Minimally palpable peripheral pulses with trace pedal  edema.  NEUROLOGIC:  No focal deficits.   IMPRESSION:  1. Persistent atrial fibrillation.      a.     Chronic Coumadin.      b.     CHAD2 score:   3.  2. Question thyroid disorder.      a.     Status post recent markedly low TSH (0.03) with follow up       level 0.28 with normal free T4.  3. Hypertension.  4. Hyperlipidemia.  5. Hyperglycemia.  6. Normal left ventricular function.      a.     Mild mitral regurgitation.   PLAN:  1. Request all pro time data from the late Dr. Magnus Ivan office. We can      then make a determination as to whether or not the patient has had      4 successive weeks of therapeutic INR in order to proceed with DC      cardioversion.  If this is not the case, then we will need to reset      the clock with weekly pro time checks.  2. Continue current medication regimen.  The patient presents with no      clinical symptoms but is in persistent atrial fibrillation.  3. Recommend formal endocrine evaluation for  possible thyroid      disorder.  4. Schedule return clinic follow up in 4-6 weeks for consideration of      possible DC cardioversion.      Gene Serpe, PA-C  Electronically Signed      Learta Codding, MD,FACC  Electronically Signed   GS/MedQ  DD: 02/28/2007  DT: 02/28/2007  Job #: [id number]

## 2010-12-23 NOTE — Assessment & Plan Note (Signed)
[facility]                          [facility]   [name]                   MRN:          [id number]  DATE:02/18/2009                            DOB:          07/13/1929    PRIMARY CARDIOLOGIST:  Karen Codding, MD,FACC   REASON FOR VISIT:  Scheduled followup.   [name] continues to do well with respect to her history of atrial  fibrillation, with no interim development of tachy palpitations.  She  has some mild chronic exertional dyspnea, but denies any exertional  angina pectoris.   Unfortunately, [name] slipped and fell descending some stairs, and  injured the right side of her body.  She did not suffer any severe  injury, and did not seek medical attention.   EKG in our office today indicates sinus bradycardia at 52 bpm.  This is  nearly identical to her last EKG in August.   REVIEW OF SYSTEMS:  Denies dizziness or presyncope/syncope.   CURRENT MEDICATIONS:  1. Metoprolol ER 50 b.i.d.  2. Cartia 120 daily.  3. Flecainide 50 b.i.d.  4. Aspirin 81 daily.  5. Coumadin 5 mg, as directed.  6. Diovan HCT 320/25 daily.  7. Prilosec OTC 20 daily.   PHYSICAL EXAMINATION:  VITAL SIGNS:  Blood pressure 167/69, pulse 56,  regular, weight 159.  GENERAL:  A 77 year old female, sitting upright, in no distress.  HEENT:  Normocephalic, atraumatic.  NECK:  Palpable carotid pulses without bruits; no JVD at 90 degrees.  LUNGS:  Clear to auscultation in all fields.  HEART:  Regular rate and rhythm.  No significant murmurs.  ABDOMEN:  Benign.  EXTREMITIES:  No edema.  NEURO:  No focal deficit.   IMPRESSION:  1. History of paroxysmal atrial fibrillation.      a.     Maintaining sinus bradycardia on beta-blocker, calcium-       channel blocker, and flecainide.      b.     Italy II score:  2.  2. Chronic Coumadin.      a.     Followed here in our clinic.  3. History of normal LVF.  4. Sinus bradycardia,  asymptomatic.  5. Hypertension, uncontrolled.   PLAN:  1. Add amlodipine 5 mg daily, for more aggressive blood pressure      control.  2. Schedule return clinic followup with myself and Dr. Andee Conway in 6      months.      Karen Serpe, PA-C  Electronically Signed      Karen Codding, MD,FACC  Electronically Signed   GS/MedQ  DD: 02/18/2009  DT: 02/19/2009  Job #: [id number]   cc:   Karen Beam, MD

## 2010-12-23 NOTE — Assessment & Plan Note (Signed)
[facility]                          [facility]   KMARI, BRIAN                   MRN:          [id number]  DATE:03/12/2008                            DOB:          05/27/1929    The patient is a very pleasant 77 year old followed by Dr. Andee Lineman for  history of paroxysmal atrial fibrillation.  She has a history of  cardioversion x2 and is on flecainide therapy.  Her most recent Myoview  was performed on 04/13/2007.  At that time, she was found to have an  ejection fraction of 62%, and there was normal perfusion.  She had an  echocardiogram performed last on Dec 20, 2006.  She had normal LV  function, mild mitral and tricuspid regurgitation.  Also, note that she  did have an exercise treadmill after being placed on flecainide, which  showed no exercise induced VT.  Since she was last see here, she is  doing well from the symptomatic standpoint.  She denies any dyspnea,  chest pain, palpitations, or syncope.  No pedal edema.  She has not had  any bleeding from her Coumadin.   MEDICATIONS:  1. Prilosec.  2. Coumadin as directed and followed here in with [facility]      in [place].  3. She takes a multivitamin.  4. Metoprolol ER 50 mg p.o. b.i.d.  5. Cartia XT 120 mg p.o. daily.  6. Diovan HCT 320/25 mg p.o. daily.  7. Flecainide 50 mg p.o. b.i.d.  8. Calcium.   PHYSICAL EXAMINATION:  VITAL SIGNS:  Blood pressure of 156/70, her pulse  is 66.  The patient weighs 170 pounds.  HEENT:  Normal.  NECK:  Supple.  CHEST:  Clear.  CARDIOVASCULAR:  Regular rhythm.  ABDOMEN:  No tenderness.  EXTREMITIES:  No edema.   DIAGNOSES:  1. Paroxysmal atrial fibrillation - the patient remains in the sinus      rhythm on the physical examination today.  She will continue on her      Cardizem and beta-blocker for rate control if atrial fibrillation      recurs.  She will continue on flecainide 50 mg p.o. b.i.d., and she      will  maintain a sinus rhythm.  Her CHADS2 score is 2 for age      greater than 2 as well as hypertension.  She will therefore      continue on Coumadin with a goal INR of 2-3, and this is being      managed in the Coumadin Clinic here in [place].  2. Hypertension - her blood pressure is mildly elevated.  We will      track this and adjust her regimen as indicated.  I will check a      BMET.  Follow potassium and renal function given her ARB/diuretic      use.  3. History of hyperlipidemia - management per Dr. Sherril Croon.  4. Coumadin therapy - we will check a CBC, and this will be followed      here in the Coumadin Clinic with a goal INR of 2-3.  5.  History of normal LV function.   I will schedule her to see Dr. Andee Lineman back here in the [place] in 8 months.     Madolyn Frieze Jens Som, MD, [facility]  Electronically Signed    BSC/MedQ  DD: 03/12/2008  DT: 03/13/2008  Job #: [id number]   cc:   Doreen Beam, MD

## 2010-12-23 NOTE — Assessment & Plan Note (Signed)
[facility]                          [facility]   Karen, Luther SEEMA Conway        MRN:          [id number]  DATE:04/04/2007                            DOB:          Feb 18, 1929    CARDIOLOGIST:  Dr. Lewayne Bunting.   PRIMARY CARE PHYSICIAN:  Dr. Doreen Beam.   REASON FOR VISIT:  Followup.   HISTORY OF PRESENT ILLNESS:  [name] is a 77 year old female patient  with a history of persistent atrial fibrillation on chronic Coumadin  therapy.  She is status post elective DC cardioversion on August 1 by  Dr. Andee Lineman.  According to the records she was discharged home back in  sinus rhythm with a heart rate of 52.  The patient notes today that she  has since seen Dr. Lucianne Muss for endocrinology evaluation.  She has a low  TSH in the past with normal free T4.  She was apparently told that she  has no thyroid disease and needs no further workup.  However, when she  saw Dr. Lucianne Muss she was also told that her heart rate was irregular and  fast and her Toprol was up titrated to 50 mg in the morning and 100 mg  in the evening.   In the office today she notes that she has felt no different since her  cardioversion.  She did try to check her pulse a day or 2 after her  cardioversion and noted that it was irregular.  She denies tachy  palpitations.  She denies syncope.  She denies orthopnea, PND.  She does  have occasional mild edema.  She denies chest pain.  Her main complaint  since she has been found to be in atrial fibrillation has been that of  fatigue and dyspnea with exertion.  She describes Class IIB - III  symptoms.   CURRENT MEDICATIONS:  1. Welchol 625 mg three tablets b.i.d.  2. Prilosec 20 mg daily.  3. Metoprolol ER 100 mg half a tablet in the morning, whole tablet in      the evening.  4. Cartia XT 240 mg daily.  5. Diovan HCT 160/25 mg daily.  6. Coumadin as directed.  7. Multivitamin.   ALLERGIES:  NO KNOWN DRUG  ALLERGIES.   PHYSICAL EXAMINATION:  She is a well-nourished, well-developed female in  no distress.  Blood pressure is 143/87, pulse 62, weight 172 pounds.  HEENT:  Normal.  NECK:  Without JVD.  CARDIAC:  Normal S1-S2, irregularly irregular rhythm.  LUNGS:  Clear to auscultation bilaterally.  ABDOMEN:  Soft, nontender.  EXTREMITIES:  Without edema.  Calves are soft, nontender.  NEUROLOGIC:  She is alert and oriented x3.  Cranial nerves II-XII are  grossly intact.   Electrocardiogram reveals atrial fibrillation with a heart rate of 85,  no acute changes.   IMPRESSION:  1. Persistent atrial fibrillation with controlled ventricular rate.      a.     Failed DC cardioversion x1.      b.     Chronic Coumadin therapy.      c.     Italy score:  3.  2. Depressed TSH level.  a.     Evaluated by endocrinology - no further workup necessary per       patient report.  3. Hypertension.  4. Hyperlipidemia.  5. History of borderline diabetes mellitus.  6. Good left ventricular function.      a.     Mild mitral regurgitation.  7. History of asthma.  8. Osteoarthritis.   PLAN:  The patient presents to the office today for followup.  She has  failed cardioversion x1, is back in atrial fibrillation.  Symptomatically she notes fatigue and shortness of breath.  This all  started when she was noted to be in atrial fibrillation back in June,  therefore we think it is important to try to get her back into normal  sinus rhythm from a symptomatic standpoint.  She did have an exercise  treadmill test after she was discharged from the hospital in June but  this was inconclusive.  She did not achieve 85% predicted maximum heart  rate.  At this point in time we plan to:   1. Proceed with an adenosine Cardiolite to rule out the possibility of      ischemic heart disease.  2. Continue to follow PT/INRs in our office.  3. Will see her back in this office in the next 2 weeks to followup on      the  above testing.  If her Cardiolite study is negative for      ischemic heart disease we will make a decision at that point in      time about proceeding with anti-arrhythmic therapy.  Dr. Myrtis Ser also      saw the patient today.  We discussed the possibility of placing her      on flecainide.  This seems to be the most      appropriate choice at this point in time.  We will make the final      decision on that when we see her back in followup with the results      of her stress test.      [name], PA-C  Electronically Signed      Luis Abed, MD, [facility]  Electronically Signed   SW/MedQ  DD: 04/04/2007  DT: 04/05/2007  Job #: [id number]   cc:   Doreen Beam

## 2010-12-23 NOTE — Assessment & Plan Note (Signed)
[facility]                          [facility]   [name], [name]        MRN:          [id number]  DATE:04/21/2007                            DOB:          12-15-1928    HISTORY OF PRESENT ILLNESS:  Patient is a very pleasant 77 year old  female with a history of recurrent atrial fibrillation.  Patient is  status post recent cardioversion.  The patient initially was  successfully cardioverted but now returns to the clinic, back in atrial  fibrillation.  She also had a stress test done on April 13, 2007  where she was already back in atrial fibrillation.  The patient states  that she feels poorly with atrial fibrillation.  She feels fatigued and  weak.  She tells me that she can tell the difference when she is in  normal sinus rhythm.  She has no chest pain or shortness of breath;  however, she does have decreased exercise tolerance.   MEDICATIONS:  1. Welchol 625 mg 3 tablets b.i.d.  2. Prilosec 20 mg p.o. daily.  3. Metoprolol ER 100 mg daily, 1/2 in the morning and 1 in the p.m.  4. Cartia XT 240 mg p.o. daily.  5. Diovan/hydrochlorothiazide 160/25 mg p.o. daily.  6. Coumadin 5 mg as directed.  7. Multivitamin.   PHYSICAL EXAMINATION:  VITAL SIGNS:  Blood pressure 130/86, heart rate  84, weight 169 pounds.  NECK:  Normal carotid upstrokes.  No carotid bruits.  LUNGS:  Clear breath sounds bilaterally.  HEART:  Irregular rate and rhythm.  Normal S1 and S2.  No murmurs,  gallops or rubs.  ABDOMEN:  Soft.  EXTREMITIES:  No clubbing, cyanosis or edema.  NEURO:  Patient alert and oriented, grossly nonfocal.   A 12-lead electrocardiogram, atrial fibrillation.  There is questionable  septal infarct but no acute ischemic changes.   PROBLEMS:  1. Persistent atrial fibrillation.      a.     Chronic Coumadin.      b.     CHADS2 3.  2. Question thyroid disorder.      a.     Status post low TSH.      b.      Repeat TSH, more within normal limits.  Patient was seen by       Dr. Lucianne Muss.  No further therapy recommended.  3. Hypertension.  4. Dyslipidemia.  5. Hyperglycemia.  6. Normal left ventricular function.  7. Recent stress test negative for ischemia.   PLAN:  1. I have told the patient that we would give another attempt at      trying to restore normal sinus rhythm.  The patient can tell a      difference when she is in normal sinus rhythm versus atrial      fibrillation; therefore, we feel it would give her another chance      to restore normal sinus rhythm to treat symptomatic atrial      fibrillation.  2. The patient will be initiated on outpatient therapy with flecainide      at 50 mg p.o. b.i.d.  We will adjust her beta blocker and calcium  blocker appropriately and write for a new prescription with Cartia      XT  120 mg a day.  Metoprolol ER will be decreased to 50 mg p.o.      b.i.d.  3. Patient will come for an RN visit next week to have her EKG checked      to make sure she does not develop excessive bradycardia.  I also      gave the patient drug information on flecainide, which she has to      read carefully.  4. In addition, we will schedule the patient for a GXT in one week to      make sure she does not develop any significant arrhythmias on      flecainide therapy during exercise.  5. If the patient tolerates medical regime well, we will repeat      cardioversion in about 10-14 days.     Learta Codding, MD,FACC  Electronically Signed    GED/MedQ  DD: 04/21/2007  DT: 04/22/2007  Job #: [id number]

## 2010-12-23 NOTE — Assessment & Plan Note (Signed)
[facility]                          [facility]   Dennice, Tindol NETA UPADHYAY        MRN:          [id number]  DATE:08/24/2007                            DOB:          November 19, 1928    HISTORY OF PRESENT ILLNESS:  The patient is a 77 year old female with a  history of atrial fibrillation.  The patient is status post  cardioversion x2.  Back in September, the patient remained in atrial  fibrillation.  She was placed on flecainide therapy.  She states she has  been doing well.  She has experienced very little palpitations.  She  denies any substernal chest pain or shortness of breath.  On EKG today  in the office, the patient is sinus rhythm, albeit in bradycardia.  She  states that her exercise tolerance has improved.  Unfortunately, the  patient did not bring her medication list, but at the time of this  dictation, she went back home, and we got her list of her medication, as  the patient was not certain whether she was on flecainide.  She is  compliant with her warfarin, however, and receives PT/INR checkups.   MEDICATIONS:  1. Warfarin as directed.  2. Metoprolol 100 mg half tablet p.o. b.i.d.  3. Cartia XT 120 mg p.o. daily.  4. Diovan hydrochlorothiazide 320/25 mg p.o. daily.  5. Flecainide 50 mg 1 tablet p.o. b.i.d. (the patient has had a stress      test post flecainide initiation therapy with no exercise-induced      ventricle tachycardia).  6. Prilosec over-the-counter/  7. Calcium.  8. Once a day vitamin.   PHYSICAL EXAMINATION:  VITAL SIGNS:  Blood pressure is 165/70, heart  rate is 48 beats per minute, weight is 170 pounds.  NECK EXAM:  Normal carotid stroke and no carotid bruits.  LUNGS:  Clear breath sounds bilaterally.  HEART:  Regular rate and rhythm with normal S1-S2.  No murmurs, rubs, or  gallop.  ABDOMEN:  Is soft, nontender.  No rebound or guarding.  Good bowel  sounds.  EXTREMITY EXAM:  No cyanosis,  clubbing or edema.  NEUROLOGICAL:  Patient alert __________  grossly nonfocal.   PROBLEM LIST:  1. Atrial fibrillation, now in normal sinus rhythm.      a.     Chronic Coumadin therapy.      b.     CHADS score is 3.  2. Questionable thyroid disorder.      a.     Status post low TSH.      b.     Repeat TSH within normal limits, now followed by Dr. Lucianne Muss.  3. Hypertension.  4. Dyslipidemia.  5. Hyperglycemia.  6. Normal left ventricular function.  7. Prior negative ischemia workup.   PLAN:  1. The patient remains on flecainide and now remains in normal sinus      rhythm.  She is bradycardic, and she does complain of some fatigue      which may be secondary to her bradycardia.  2. At the time of dictation, we did not have the patient's medication      list and asked the patient to cut her  metoprolol dose to 50 mg      twice a day given her bradycardia, and now this will be further      reduced to 25 mg twice a day.  The patient will be notified about      this change.  3. The patient will continue follow-up in the Coumadin clinic.  She      has a high CHADS score and should remain on Coumadin unless      contraindications.     Learta Codding, MD,FACC  Electronically Signed    GED/MedQ  DD: 08/25/2007  DT: 08/25/2007  Job #: [id number]

## 2010-12-23 NOTE — Assessment & Plan Note (Signed)
[facility]                          [facility]   [name], [name] [name]        MRN:          [id number]  DATE:01/20/2007                            DOB:          1929/06/25    HISTORY OF PRESENT ILLNESS:  [name] is a 77 year old female patient  who was recently admitted to [facility] with new onset atrial  fibrillation with rapid ventricular rate.  She was rate controlled, and  set up for an echocardiogram.  This revealed an EF of 55%.  No valvular  abnormalities.  She was also set up for an exercise treadmill test on  medications to assess rate control.  She was only able to achieve 72% of  her maximum predicted heart rate.  She achieved 6.7 METS.  Her test was  not changed.  She returns today for followup.  Overall, she is doing  well.  She does have occasional mild edema in her ankles.  This is  better in the mornings, worse in the evenings.  She does have  varicosities.  She denies any chest heaviness or tightness.  She does  get some mild shortness of breath with exertion from time to time.  She  denies any syncope or near syncope.  Denies any palpitations.  Denies  any arm or jaw discomfort, nausea or diaphoresis with exertion.   CURRENT MEDICATIONS:  1. Welchol 625 mg 3 tablets b.i.d.  2. Prilosec 20 mg daily.  3. Metoprolol ER 100 mg daily.  4. Cartia XT 240 mg daily.  5. Diovan/hydrochlorothiazide 160/25 mg daily.  6. Coumadin as directed.   ALLERGIES:  NO KNOWN DRUG ALLERGIES.   PHYSICAL EXAMINATION:  She is a well-nourished, well-developed female.  Blood pressure 130/77.  Pulse 73.  Weight 168 pounds.  HEENT:  Normal.  NECK:  Without JVD.  ENDOCRINE:  Without thyromegaly.  Carotids without bruits bilaterally.  CARDIAC:  Normal S1 and S2.  Irregular rate, irregular rhythm.  LUNGS:  Clear to auscultation bilaterally without wheezing, rhonchi, or  rales.  ABDOMEN:  Soft and non-tender with  normoactive bowel sounds.  No  organomegaly.  EXTREMITIES:  Without edema.  Calves soft and non-tender.  SKIN:  Warm and dry.  NEUROLOGIC:  She is alert and oriented x3.  Cranial nerves are grossly  intact.  ELECTROCARDIOGRAM:  Reveals atrial fibrillation with a heart rate of 69.  No acute changes.   IMPRESSION:  1. Persistent atrial fibrillation with controlled ventricular      response.  2. Hypertension.  3. Dyslipidemia.  4. Questionable history of diabetes mellitus.  5. Abnormal thyroid test.      a.     TSH 0.03 on Dec 19, 2006.  6. Osteoarthritis.  7. Asthma.  8. Osteoporosis.  9. Reflux esophagitis.  10.History of left knee cartilage repair June 2007.   PLAN:  The patient presents back to the office today for post  hospitalization followup.  From a symptomatic standpoint, she seems to  be doing quite well.  Her rate is very well controlled.  As noted from  her exercise treadmill test, she did not have great increases in her  heart rate.  She  is not having any symptoms concerning for coronary  ischemia.  I discussed this further with Dr. Myrtis Ser.  At this point in  time, we do not plan to proceed with an adenosine Cardiolite study.  Her  echocardiogram was normal.  We will continue with her present therapy.  We will try to obtain INR readings from Dr. Magnus Ivan office.  She does  need to follow up on her thyroid.  We will set her up for followup TSH,  free T4, free T3.  We will also set her up for followup with Dr.  Magnus Ivan office to further address this.  Her thyroid issues will need  to be addressed further before we can decide on a possibility of  cardioversion.  I will bring her back in about 4 weeks to follow up with  Dr. Andee Lineman.      [name], PA-C  Electronically Signed      Luis Abed, MD, [facility]  Electronically Signed   SW/MedQ  DD: 01/20/2007  DT: 01/20/2007  Job #: [id number]   cc:   [name]

## 2010-12-24 ENCOUNTER — Encounter: Payer: Self-pay | Admitting: Cardiovascular Disease

## 2010-12-24 NOTE — Telephone Encounter (Signed)
No answer

## 2010-12-26 ENCOUNTER — Ambulatory Visit (INDEPENDENT_AMBULATORY_CARE_PROVIDER_SITE_OTHER): Payer: Medicare Other | Admitting: *Deleted

## 2010-12-26 ENCOUNTER — Encounter: Payer: Medicare Other | Admitting: *Deleted

## 2010-12-26 DIAGNOSIS — I4891 Unspecified atrial fibrillation: Secondary | ICD-10-CM

## 2010-12-26 DIAGNOSIS — Z7901 Long term (current) use of anticoagulants: Secondary | ICD-10-CM

## 2010-12-26 LAB — POCT INR: INR: 2.8

## 2010-12-26 NOTE — Telephone Encounter (Addendum)
Patient notified.  States nausea has gotten a little better.  Will hold off on GI referral for now.  Has OV on 6/4 already scheduled.

## 2010-12-29 ENCOUNTER — Encounter: Payer: Self-pay | Admitting: *Deleted

## 2010-12-30 ENCOUNTER — Encounter: Payer: Self-pay | Admitting: Cardiovascular Disease

## 2010-12-30 ENCOUNTER — Ambulatory Visit (INDEPENDENT_AMBULATORY_CARE_PROVIDER_SITE_OTHER): Payer: Medicare Other | Admitting: Cardiovascular Disease

## 2010-12-30 ENCOUNTER — Encounter: Payer: Medicare Other | Admitting: *Deleted

## 2010-12-30 VITALS — BP 130/60 | HR 49 | Ht 63.0 in | Wt 149.0 lb

## 2010-12-30 DIAGNOSIS — I4891 Unspecified atrial fibrillation: Secondary | ICD-10-CM

## 2010-12-30 DIAGNOSIS — I701 Atherosclerosis of renal artery: Secondary | ICD-10-CM

## 2010-12-30 NOTE — Progress Notes (Signed)
History of Present Illness:77 yo female with history of HTN, atrial fibrillation on chronic coumadin therapy, recent renal dysfunction while hospitalized at [facility] who is here today for evaluation of possible renal artery stenosis. Her cardiac issues are followed by Dr. Andee Lineman. She denies any chest pain, SOB, palpitations. She describes a lack of energy and nausea over the last three weeks. She had a CT angiogram at [facility] in January 2012 that suggested at least 50 % bilateral renal artery stenosis. She did have renal insufficiency in the hospital this spring. HTN has been well controlled on medical therapy.   Past Medical History  Diagnosis Date  . Sinoatrial node dysfunction      SINUS BRADYCARDIA  . Hypertension     unspecified  . Atrial fibrillation   . Encounter for long-term (current) use of other medications     COUMADIN THERAPY  . Lower extremity weakness   . Bilateral renal artery stenosis     MODERATE. QUESTION COMBINATION OF ATHERO AND FMD  . Sinus bradycardia     Past Surgical History  Procedure Date  . Knee arthroscopy   . Cardioversion     Current Outpatient Prescriptions  Medication Sig Dispense Refill  . amiodarone (PACERONE) 200 MG tablet Take 1/2 tab (100mg ) twice a day      . aspirin 81 MG tablet Take 81 mg by mouth daily.        . Calcium Carbonate-Vitamin D (CALTRATE 600+D) 600-400 MG-UNIT per tablet Take one by mouth twice daily        . Cholecalciferol 2000 UNITS TABS Take by mouth 2 (two) times daily.        Marland Kitchen dextromethorphan-guaiFENesin (MUCINEX DM) 30-600 MG per 12 hr tablet Take 1 tablet by mouth every 12 (twelve) hours.        Marland Kitchen diltiazem (CARDIZEM CD) 120 MG 24 hr capsule Take 120 mg by mouth daily.        Marland Kitchen diltiazem (CARDIZEM CD) 240 MG 24 hr capsule Take 240 mg by mouth daily.       Marland Kitchen glyBURIDE-metformin (GLUCOVANCE) 2.5-500 MG per tablet Take 1 tablet by mouth daily with breakfast.        . labetalol (NORMODYNE) 200 MG tablet Take 200 mg  by mouth 2 (two) times daily.       Marland Kitchen lovastatin (MEVACOR) 20 MG tablet Take 20 mg by mouth at bedtime.        Marland Kitchen oxyCODONE-acetaminophen (PERCOCET) 5-325 MG per tablet Take 1 tablet by mouth every 4 (four) hours as needed.        . pantoprazole (PROTONIX) 40 MG tablet Take 40 mg by mouth daily.        . prazosin (MINIPRESS) 1 MG capsule Take one by mouth twice daily        . promethazine (PHENERGAN) 25 MG tablet Take 25 mg by mouth every 6 (six) hours as needed.        . traMADol (ULTRAM) 50 MG tablet Take one by mouth twice daily as needed       . warfarin (COUMADIN) 5 MG tablet Use as directed       . DISCONTD: chlorthalidone (HYGROTON) 50 MG tablet Take 25 mg by mouth daily.        Marland Kitchen DISCONTD: losartan (COZAAR) 50 MG tablet Take one by mouth daily       . DISCONTD: omeprazole (PRILOSEC OTC) 20 MG tablet Take 20 mg by mouth daily.  No Known Allergies  History   Social History  . Marital Status: Married    Spouse Name: N/A    Number of Children: N/A  . Years of Education: N/A   Occupational History  . RETIRED    Social History Main Topics  . Smoking status: Never Smoker   . Smokeless tobacco: Not on file  . Alcohol Use: No  . Drug Use: No  . Sexually Active: Not on file   Other Topics Concern  . Not on file   Social History Narrative  . No narrative on file    Family History  Problem Relation Age of Onset  . Cancer Other   . Heart failure Other   . Cirrhosis Other     Review of Systems:  As stated in the HPI and otherwise negative.   BP 130/60  Pulse 49  Ht 5\' 3"  (1.6 m)  Wt 149 lb (67.586 kg)  BMI 26.39 kg/m2  Physical Examination: General: Well developed, well nourished, NAD HEENT: OP clear, mucus membranes moist SKIN: warm, dry. No rashes. Neuro: No focal deficits Musculoskeletal: Muscle strength 5/5 all ext Psychiatric: Mood and affect normal Neck: No JVD, no carotid bruits, no thyromegaly, no lymphadenopathy. Lungs:Clear bilaterally, no  wheezes, rhonci, crackles Cardiovascular: Regular rate and rhythm. No murmurs, gallops or rubs. Abdomen:Soft. Bowel sounds present. Non-tender.  Extremities: No lower extremity edema. Pulses are 2 + in the bilateral DP/PT.  ZOX:WRUEA brady, rate 49bpm. Non-specific T wave abnormality.

## 2010-12-30 NOTE — Assessment & Plan Note (Signed)
She has moderate renal artery stenosis by CTA. Her BP is currently well controlled. Her renal function has been normal since discharge from hospital. I have discussed an angiogram to further define disease however will delay until her other issues are resolved. She has constant nausea, possibly from amiodarone. She has f/u in [place] office in 2 weeks. I will see her back in 3 months and further discuss renal artery angiogram at that time.

## 2010-12-30 NOTE — Patient Instructions (Signed)
Your physician recommends that you schedule a follow-up appointment in: 3 months with Dr. McAlhany.  

## 2011-01-12 ENCOUNTER — Encounter (INDEPENDENT_AMBULATORY_CARE_PROVIDER_SITE_OTHER): Payer: Medicare Other | Admitting: *Deleted

## 2011-01-12 ENCOUNTER — Encounter: Payer: Self-pay | Admitting: Cardiology

## 2011-01-12 ENCOUNTER — Other Ambulatory Visit (INDEPENDENT_AMBULATORY_CARE_PROVIDER_SITE_OTHER): Payer: Medicare Other | Admitting: Cardiology

## 2011-01-12 ENCOUNTER — Ambulatory Visit (INDEPENDENT_AMBULATORY_CARE_PROVIDER_SITE_OTHER): Payer: Medicare Other | Admitting: Cardiology

## 2011-01-12 VITALS — BP 151/70 | HR 50 | Ht 64.0 in | Wt 149.0 lb

## 2011-01-12 DIAGNOSIS — I4891 Unspecified atrial fibrillation: Secondary | ICD-10-CM

## 2011-01-12 DIAGNOSIS — R110 Nausea: Secondary | ICD-10-CM

## 2011-01-12 DIAGNOSIS — Z7901 Long term (current) use of anticoagulants: Secondary | ICD-10-CM

## 2011-01-12 DIAGNOSIS — I495 Sick sinus syndrome: Secondary | ICD-10-CM

## 2011-01-12 DIAGNOSIS — I10 Essential (primary) hypertension: Secondary | ICD-10-CM

## 2011-01-12 DIAGNOSIS — I5033 Acute on chronic diastolic (congestive) heart failure: Secondary | ICD-10-CM

## 2011-01-12 DIAGNOSIS — K219 Gastro-esophageal reflux disease without esophagitis: Secondary | ICD-10-CM

## 2011-01-12 DIAGNOSIS — R0989 Other specified symptoms and signs involving the circulatory and respiratory systems: Secondary | ICD-10-CM

## 2011-01-12 DIAGNOSIS — R1310 Dysphagia, unspecified: Secondary | ICD-10-CM | POA: Insufficient documentation

## 2011-01-12 DIAGNOSIS — I701 Atherosclerosis of renal artery: Secondary | ICD-10-CM

## 2011-01-12 NOTE — Assessment & Plan Note (Signed)
Stable and asymptomatic  

## 2011-01-12 NOTE — Assessment & Plan Note (Signed)
Patient could have functional esophageal disease or gastric dysmotility from her diabetes. This may be also contributing to her nausea. However she has symptoms that are consistent with food hanging up in the distal esophagus. We will refer her for a barium swallow and if positive she will need a GI evaluation.

## 2011-01-12 NOTE — Patient Instructions (Addendum)
   Follow up with Dr. Sanjuana Kava as stated above  Barium Swallow  If the results of your test are normal or stable, you will receive a letter.  If they are abnormal, the nurse will contact you by phone. Your physician wants you to follow up in:  3 months.  You will receive a reminder letter in the mail one-two months in advance.  If you don't receive a letter, please call our office to schedule the follow up appointment

## 2011-01-12 NOTE — Assessment & Plan Note (Signed)
Patient is followed in [place]. We will make sure that she has a followup appointment which was due 3 months after she saw Dr.McAlhaney. She also has followup with the nephrology department.

## 2011-01-12 NOTE — Assessment & Plan Note (Addendum)
Stable no evidence of heart failure currently. No change in diuretic regimen needed. Normalize ejection fraction.

## 2011-01-12 NOTE — Assessment & Plan Note (Signed)
Blood pressure under good control we'll make no adjustments in her medications.

## 2011-01-12 NOTE — Progress Notes (Signed)
HPI The patient was recently seen by Dr. Sanjuana Kava for evaluation of renal artery stenosis. She had a CT scan done in January of 2012. This suggested at least 50% bilateral renal artery stenosis. She has underlying renal insufficiency. She also had difficult to control hypertension. She is on multiple medications. There was a possible discussion to proceed with a CT angiogram and the patient will be seen back in a couple of months by Dr. Sanjuana Kava She has had some recurrent nausea possibly from amiodarone. The patient was initially on flecainide but failed this. She's had recurrent atrial fibrillation which required cardioversion. She is also on chronic Coumadin therapy. Of note is that the patient had a prior tachycardia-induced cardiomyopathy which resolved. The patient was cardioverted on 12/05/2010. She converted back to normal sinus rhythm and was continued on amiodarone. The patient also her sports dysphagia. She feels that her food is hanging up particularly with solids. She states she has difficulty taking her pills and typically drinks this would buttermilk. She does have symptoms consistent with estrogen reflux disease and is on a PPI. At times she feels like she has heaving. Blood pressure readings have been well controlled. Blood pressure somewhat elevated today but the patient showed me her readings and they are 80% of the time within normal limits. She somewhat bradycardic and we were thinking of reducing her amiodarone 200 mg a day because of nausea. However given the recent history of dysphagia or significant reflux other possibilities come to mind like esophageal dysmotility and gastroparesis particular because the patient is a diabetic but more importantly a possible distal stricture secondary to chronic reflux disease. From a cardiac standpoint she is doing well. She denies any chest pain shortness of breath orthopnea PND she has no palpitations or syncope. She reports no edema  No Known  Allergies  Current Outpatient Prescriptions on File Prior to Visit  Medication Sig Dispense Refill  . amiodarone (PACERONE) 200 MG tablet Take 1/2 tab (100mg ) twice a day      . aspirin 81 MG tablet Take 81 mg by mouth daily.        . Calcium Carbonate-Vitamin D (CALTRATE 600+D) 600-400 MG-UNIT per tablet Take one by mouth twice daily        . Cholecalciferol 2000 UNITS TABS Take by mouth 2 (two) times daily.        Marland Kitchen dextromethorphan-guaiFENesin (MUCINEX DM) 30-600 MG per 12 hr tablet Take 1 tablet by mouth 2 (two) times daily as needed.       . diltiazem (CARDIZEM CD) 120 MG 24 hr capsule Take 120 mg by mouth at bedtime.       Marland Kitchen diltiazem (CARDIZEM CD) 240 MG 24 hr capsule Take 240 mg by mouth daily. At noon      . glyBURIDE-metformin (GLUCOVANCE) 2.5-500 MG per tablet Take 1 tablet by mouth daily with breakfast.        . labetalol (NORMODYNE) 200 MG tablet Take 200 mg by mouth 2 (two) times daily.       Marland Kitchen lovastatin (MEVACOR) 20 MG tablet Take 20 mg by mouth at bedtime.        Marland Kitchen oxyCODONE-acetaminophen (PERCOCET) 5-325 MG per tablet Take 1 tablet by mouth every 4 (four) hours as needed.        . pantoprazole (PROTONIX) 40 MG tablet Take 40 mg by mouth daily.        . prazosin (MINIPRESS) 1 MG capsule Take one by mouth twice daily        .  promethazine (PHENERGAN) 25 MG tablet Take 25 mg by mouth every 6 (six) hours as needed.        . warfarin (COUMADIN) 5 MG tablet Use as directed       . traMADol (ULTRAM) 50 MG tablet Take one by mouth twice daily as needed         Past Medical History  Diagnosis Date  . Sinoatrial node dysfunction      SINUS BRADYCARDIA  . Hypertension     unspecified  . Atrial fibrillation   . Encounter for long-term (current) use of other medications     COUMADIN THERAPY  . Lower extremity weakness   . Bilateral renal artery stenosis     MODERATE. QUESTION COMBINATION OF ATHERO AND FMD  . Sinus bradycardia     Past Surgical History  Procedure Date  .  Knee arthroscopy   . Cardioversion     Family History  Problem Relation Age of Onset  . Cancer Other   . Heart failure Other   . Cirrhosis Other     History   Social History  . Marital Status: Married    Spouse Name: N/A    Number of Children: N/A  . Years of Education: N/A   Occupational History  . RETIRED    Social History Main Topics  . Smoking status: Never Smoker   . Smokeless tobacco: Never Used  . Alcohol Use: No  . Drug Use: No  . Sexually Active: Not on file   Other Topics Concern  . Not on file   Social History Narrative  . No narrative on file  ZOX:[id number] positives as outlined above. The remainder of the 18  point review of systems is negative   PHYSICAL EXAM BP 151/70  Pulse 50  Ht 5\' 4"  (1.626 m)  Wt 149 lb (67.586 kg)  BMI 25.58 kg/m2 General: Well-developed, well-nourished in no distress Head: Normocephalic and atraumatic Eyes:PERRLA/EOMI intact, conjunctiva and lids normal Ears: No deformity or lesions Mouth:normal dentition, normal posterior pharynx Neck: Supple, no JVD.  No masses, thyromegaly or abnormal cervical nodes Lungs: Normal breath sounds bilaterally without wheezing.  Normal percussion Cardiac: regular rate and rhythm with normal S1 and S2, no S3 or S4.  PMI is normal.  No pathological murmurs Abdomen: Normal bowel sounds, abdomen is soft and nontender without masses, organomegaly or hernias noted.  No hepatosplenomegaly MSK: Back normal, normal gait muscle strength and tone normal Vascular: Pulse is normal in all 4 extremities Extremities: No peripheral pitting edema Neurologic: Alert and oriented x 3 Skin: Intact without lesions or rashes Lymphatics: No significant adenopathy Psychologic: Normal affect   ECG: Sinus bradycardia heart rate 49 beats per minute. QTC 440 ms  ASSESSMENT AND PLAN

## 2011-01-12 NOTE — Assessment & Plan Note (Signed)
The patient remained in normal sinus rhythm on amiodarone. She status post cardioversion on 12/05/2010. She is on Coumadin anticoagulation. She does report some nausea but is not entirely certain if this is related to amiodarone. Pending an evaluation for a GI workup we may consider dropping down her dose of amiodarone  To 100 mg daily.

## 2011-02-10 ENCOUNTER — Ambulatory Visit (INDEPENDENT_AMBULATORY_CARE_PROVIDER_SITE_OTHER): Payer: Medicare Other | Admitting: *Deleted

## 2011-02-10 DIAGNOSIS — I4891 Unspecified atrial fibrillation: Secondary | ICD-10-CM

## 2011-02-10 LAB — POCT INR: INR: 2.9

## 2011-03-10 ENCOUNTER — Ambulatory Visit (INDEPENDENT_AMBULATORY_CARE_PROVIDER_SITE_OTHER): Payer: Medicare Other | Admitting: *Deleted

## 2011-03-10 ENCOUNTER — Telehealth: Payer: Self-pay | Admitting: Cardiology

## 2011-03-10 ENCOUNTER — Encounter: Payer: Self-pay | Admitting: Cardiovascular Disease

## 2011-03-10 DIAGNOSIS — I4891 Unspecified atrial fibrillation: Secondary | ICD-10-CM

## 2011-03-10 DIAGNOSIS — R110 Nausea: Secondary | ICD-10-CM

## 2011-03-10 DIAGNOSIS — Z7901 Long term (current) use of anticoagulants: Secondary | ICD-10-CM

## 2011-03-11 NOTE — Telephone Encounter (Signed)
Patient still c/o nausea & weight loss.  At last OV in June, weight was 149 lb.   Now states that she is weighing 142 lb.  Advised her that we will check with MD to see if decreasing her Amiodarone is an option for her.  Currently on Amiodarone 200mg  - taking 1/2 tab twice daily.  Dictation states may decrease to daily if nausea persists.  Or, does she need GI referral?  Not due to be seen till October for 3 month f/u.

## 2011-03-12 ENCOUNTER — Ambulatory Visit: Payer: Medicare Other | Admitting: Cardiovascular Disease

## 2011-03-12 NOTE — Telephone Encounter (Signed)
Decrease amiodarone 100mg  a day to schedule a GI appointment as soon as possible.

## 2011-03-13 NOTE — Telephone Encounter (Signed)
Patient notified of below.  Will send GI referral.

## 2011-03-13 NOTE — Telephone Encounter (Signed)
Addended by: Murriel Hopper on: 03/13/2011 11:43 AM   Modules accepted: Orders

## 2011-03-17 ENCOUNTER — Encounter: Payer: Self-pay | Admitting: Cardiovascular Disease

## 2011-03-17 ENCOUNTER — Ambulatory Visit (INDEPENDENT_AMBULATORY_CARE_PROVIDER_SITE_OTHER): Payer: Medicare Other | Admitting: Cardiovascular Disease

## 2011-03-17 VITALS — BP 137/62 | HR 53 | Ht 64.0 in | Wt 146.0 lb

## 2011-03-17 DIAGNOSIS — I701 Atherosclerosis of renal artery: Secondary | ICD-10-CM

## 2011-03-17 NOTE — Patient Instructions (Signed)
Your physician recommends that you schedule a follow-up appointment in: 6 months with [facility]  Your physician has requested that you have a renal artery duplex. During this test, an ultrasound is used to evaluate blood flow to the kidneys. Allow one hour for this exam. Do not eat after midnight the day before and avoid carbonated beverages. Take your medications as you usually do.

## 2011-03-17 NOTE — Progress Notes (Signed)
History of Present Illness:77 yo female with history of HTN, atrial fibrillation on chronic coumadin therapy, recent renal dysfunction while hospitalized at [facility] who is here today for PV follow up. I saw her three months ago for evaluation of possible renal artery stenosis. Her cardiac issues are followed by Dr. Andee Lineman. She had a CT angiogram at [facility] in January 2012 that suggested at least 50 % bilateral renal artery stenosis. She did have renal insufficiency in the hospital this spring. HTN has been well controlled on medical therapy. We elected to hold off on renal artery angiogram as she had multiple other issues at that time including ongoing nausea.   She tells me today that her nausea has gotten better. Her BP has been very well controlled at home. She has been seen by Dr. Allena Katz in [place] and has been told that her renal function is stable. I do not have the BMET to see her creatinine.   Her primary care is Dr. Sherril Croon in [place].            Past Medical History  Diagnosis Date  . Sinoatrial node dysfunction      SINUS BRADYCARDIA  . Hypertension     unspecified  . Atrial fibrillation   . Encounter for long-term (current) use of other medications     COUMADIN THERAPY  . Lower extremity weakness   . Bilateral renal artery stenosis     MODERATE. QUESTION COMBINATION OF ATHERO AND FMD  . Sinus bradycardia     Past Surgical History  Procedure Date  . Knee arthroscopy   . Cardioversion     Current Outpatient Prescriptions  Medication Sig Dispense Refill  . amiodarone (PACERONE) 200 MG tablet Take 1/2 tab (100mg ) daily      . aspirin 81 MG tablet Take 81 mg by mouth daily.        . Calcium Carbonate-Vitamin D (CALTRATE 600+D) 600-400 MG-UNIT per tablet Take one by mouth twice daily        . Cholecalciferol 2000 UNITS TABS Take by mouth 2 (two) times daily.        Marland Kitchen dextromethorphan-guaiFENesin (MUCINEX DM) 30-600 MG per 12 hr tablet Take 1 tablet by mouth 2 (two)  times daily as needed.       . diltiazem (CARDIZEM CD) 120 MG 24 hr capsule Take 120 mg by mouth at bedtime.       Marland Kitchen diltiazem (CARDIZEM CD) 240 MG 24 hr capsule Take 240 mg by mouth daily. At noon      . glyBURIDE-metformin (GLUCOVANCE) 2.5-500 MG per tablet Take 1 tablet by mouth daily with breakfast.        . labetalol (NORMODYNE) 200 MG tablet Take 200 mg by mouth 2 (two) times daily.       Marland Kitchen lovastatin (MEVACOR) 20 MG tablet Take 20 mg by mouth at bedtime.        Marland Kitchen oxyCODONE-acetaminophen (PERCOCET) 5-325 MG per tablet Take 1 tablet by mouth every 4 (four) hours as needed.        . pantoprazole (PROTONIX) 40 MG tablet Take 40 mg by mouth daily.        . prazosin (MINIPRESS) 1 MG capsule Take one by mouth twice daily        . promethazine (PHENERGAN) 25 MG tablet Take 25 mg by mouth every 6 (six) hours as needed.        . traMADol (ULTRAM) 50 MG tablet Take one by mouth twice daily as needed       .  warfarin (COUMADIN) 5 MG tablet Use as directed         No Known Allergies  History   Social History  . Marital Status: Married    Spouse Name: N/A    Number of Children: N/A  . Years of Education: N/A   Occupational History  . RETIRED    Social History Main Topics  . Smoking status: Never Smoker   . Smokeless tobacco: Never Used  . Alcohol Use: No  . Drug Use: No  . Sexually Active: Not on file   Other Topics Concern  . Not on file   Social History Narrative  . No narrative on file    Family History  Problem Relation Age of Onset  . Cancer Other   . Heart failure Other   . Cirrhosis Other     Review of Systems:  As stated in the HPI and otherwise negative.   BP 137/62  Pulse 53  Ht 5\' 4"  (1.626 m)  Wt 146 lb (66.225 kg)  BMI 25.06 kg/m2  Physical Examination: General: Well developed, well nourished, NAD HEENT: OP clear, mucus membranes moist SKIN: warm, dry. No rashes. Neuro: No focal deficits Musculoskeletal: Muscle strength 5/5 all ext Psychiatric: Mood  and affect normal Neck: No JVD, no carotid bruits, no thyromegaly, no lymphadenopathy. Lungs:Clear bilaterally, no wheezes, rhonci, crackles Cardiovascular: Regular rate and rhythm. No murmurs, gallops or rubs. Abdomen:Soft. Bowel sounds present. Non-tender.  Extremities: No lower extremity edema. Pulses are 2 + in the bilateral DP/PT.

## 2011-03-17 NOTE — Assessment & Plan Note (Signed)
She likely has some degree of renal artery stenosis based on the CTA in January at [facility]. Her BP is well controlled on medical therapy. Her renal function is stable. I have discussed the possiblity of an angiogram if necessary, however, I do not think an angiogram is indicated at this time. Will get renal artery dopplers to reassess. Follow up 6 months. If there is any change in her renal function or if her BP control becomes difficult, will change plans at that time.

## 2011-04-07 ENCOUNTER — Other Ambulatory Visit: Payer: Self-pay | Admitting: *Deleted

## 2011-04-07 ENCOUNTER — Ambulatory Visit (INDEPENDENT_AMBULATORY_CARE_PROVIDER_SITE_OTHER): Payer: Medicare Other | Admitting: *Deleted

## 2011-04-07 DIAGNOSIS — Z7901 Long term (current) use of anticoagulants: Secondary | ICD-10-CM

## 2011-04-07 DIAGNOSIS — I4891 Unspecified atrial fibrillation: Secondary | ICD-10-CM

## 2011-04-07 LAB — POCT INR: INR: 4.5

## 2011-04-07 MED ORDER — WARFARIN SODIUM 5 MG PO TABS: 5.0000 mg | ORAL_TABLET | ORAL | Status: DC

## 2011-04-08 ENCOUNTER — Encounter (INDEPENDENT_AMBULATORY_CARE_PROVIDER_SITE_OTHER): Payer: Medicare Other | Admitting: Cardiology

## 2011-04-08 DIAGNOSIS — N189 Chronic kidney disease, unspecified: Secondary | ICD-10-CM

## 2011-04-08 DIAGNOSIS — I701 Atherosclerosis of renal artery: Secondary | ICD-10-CM

## 2011-04-09 ENCOUNTER — Other Ambulatory Visit: Payer: Self-pay | Admitting: Physician Assistant

## 2011-04-21 ENCOUNTER — Ambulatory Visit (INDEPENDENT_AMBULATORY_CARE_PROVIDER_SITE_OTHER): Payer: Medicare Other | Admitting: *Deleted

## 2011-04-21 DIAGNOSIS — I4891 Unspecified atrial fibrillation: Secondary | ICD-10-CM

## 2011-04-21 DIAGNOSIS — Z7901 Long term (current) use of anticoagulants: Secondary | ICD-10-CM

## 2011-04-21 LAB — POCT INR: INR: 2.2

## 2011-05-11 HISTORY — PX: COLONOSCOPY: SHX174

## 2011-05-15 ENCOUNTER — Ambulatory Visit (INDEPENDENT_AMBULATORY_CARE_PROVIDER_SITE_OTHER): Payer: Medicare Other | Admitting: *Deleted

## 2011-05-15 ENCOUNTER — Ambulatory Visit (INDEPENDENT_AMBULATORY_CARE_PROVIDER_SITE_OTHER): Payer: Medicare Other | Admitting: Cardiology

## 2011-05-15 VITALS — BP 115/56 | HR 52 | Resp 16 | Ht 64.0 in | Wt 146.0 lb

## 2011-05-15 DIAGNOSIS — I340 Nonrheumatic mitral (valve) insufficiency: Secondary | ICD-10-CM

## 2011-05-15 DIAGNOSIS — I701 Atherosclerosis of renal artery: Secondary | ICD-10-CM

## 2011-05-15 DIAGNOSIS — I4891 Unspecified atrial fibrillation: Secondary | ICD-10-CM

## 2011-05-15 DIAGNOSIS — N289 Disorder of kidney and ureter, unspecified: Secondary | ICD-10-CM

## 2011-05-15 DIAGNOSIS — Z7901 Long term (current) use of anticoagulants: Secondary | ICD-10-CM

## 2011-05-15 DIAGNOSIS — I059 Rheumatic mitral valve disease, unspecified: Secondary | ICD-10-CM

## 2011-05-15 DIAGNOSIS — I10 Essential (primary) hypertension: Secondary | ICD-10-CM

## 2011-05-15 NOTE — Assessment & Plan Note (Signed)
We'll followup with a BMET today

## 2011-05-15 NOTE — Assessment & Plan Note (Signed)
Stable with no new findings on exam

## 2011-05-15 NOTE — Assessment & Plan Note (Signed)
In normal sinus rhythm on amiodarone therapy. We will check a TSH and liver function test.

## 2011-05-15 NOTE — Progress Notes (Signed)
History of present illness: The patient is a 77 year old female with a history of difficult to control hypertension, mild renal insufficiency, nonobstructive renal artery stenosis and prior history of atrial fibrillation status post cardioversion and tachycardia-induced cardiomyopathy. The patient was initially seen in [place] and was felt not to be in need of revascularization of her renal arteries. She is also followed by nephrology recommended initiation of hydralazine, but the patient's blood pressure is now well controlled on a good regimen. The patient gave me her blood pressure readings and they're quite stable. She has very rare outliers with systolic blood pressure 165 mmHg, but 90% of the time her blood pressure is very well controlled. She also reports no recurrent palpitations and is tolerating low-dose amiodarone. She is also continuing with warfarin and she reports no side effects or bleeding complications. Overall the patient is stable from a cardiovascular perspective and has done well.   Allergies, family history and social history: As documented in chart and reviewed  Medications: Documented and reviewed in chart.  Past medical history: Reviewed and see problem list below   Review of systems: No nausea or vomiting. No melena hematochezia. No dysuria or frequency. No palpitations or syncope. No visual changes or gait disturbance.   Physical examination : Vital signs documented below General: Well-nourished white female in no apparent distress HEENT: EOMI, PERRLA, normal carotid upstroke no carotid bruits. No thyromegaly nonnodular thyroid. Lungs: Clear breath sounds bilaterally, no wheezing Heart: Regular rate and rhythm with normal S1-S2 and no murmur rubs or gallops Abdomen: Soft nontender engorgement or guarding good bowel sounds. Extremity exam: No cyanosis clubbing or edema Neurologic: Alert and oriented x3. Grossly nonfocal Psychiatric: Normal affect Vascular exam:  Normal peripheral pulses.  Rhythm strip with a handheld monitor was obtained and the patient remained in normal sinus rhythm.

## 2011-05-15 NOTE — Patient Instructions (Signed)
Your physician recommends that you go to the [facility] for lab work for [employer], TSH, & LFT's.   If the results of your test are normal or stable, you will receive a letter.  If they are abnormal, the nurse will contact you by phone. No Hydralazine Your physician wants you to follow up in: 6 months.  You will receive a reminder letter in the mail one-two months in advance.  If you don't receive a letter, please call our office to schedule the follow up appointment

## 2011-05-15 NOTE — Assessment & Plan Note (Signed)
Medical therapy for now. Followed by Dr. Sanjuana Kava.

## 2011-05-15 NOTE — Assessment & Plan Note (Addendum)
Blood pressures under good control. Continue medical therapy. No indication to add hydralazine and asked the patient not to fill this drug

## 2011-05-15 NOTE — Assessment & Plan Note (Signed)
Continue Coumadin anticoagulation

## 2011-06-05 ENCOUNTER — Ambulatory Visit (INDEPENDENT_AMBULATORY_CARE_PROVIDER_SITE_OTHER): Payer: Medicare Other | Admitting: *Deleted

## 2011-06-05 DIAGNOSIS — I4891 Unspecified atrial fibrillation: Secondary | ICD-10-CM

## 2011-06-05 DIAGNOSIS — Z7901 Long term (current) use of anticoagulants: Secondary | ICD-10-CM

## 2011-06-26 ENCOUNTER — Ambulatory Visit (INDEPENDENT_AMBULATORY_CARE_PROVIDER_SITE_OTHER): Payer: Medicare Other | Admitting: *Deleted

## 2011-06-26 DIAGNOSIS — Z7901 Long term (current) use of anticoagulants: Secondary | ICD-10-CM

## 2011-06-26 DIAGNOSIS — I4891 Unspecified atrial fibrillation: Secondary | ICD-10-CM

## 2011-06-26 LAB — POCT INR: INR: 1.8

## 2011-07-14 ENCOUNTER — Ambulatory Visit (INDEPENDENT_AMBULATORY_CARE_PROVIDER_SITE_OTHER): Payer: Medicare Other | Admitting: *Deleted

## 2011-07-14 DIAGNOSIS — Z7901 Long term (current) use of anticoagulants: Secondary | ICD-10-CM

## 2011-07-14 DIAGNOSIS — I4891 Unspecified atrial fibrillation: Secondary | ICD-10-CM

## 2011-07-14 LAB — POCT INR: INR: 2.2

## 2011-08-07 ENCOUNTER — Ambulatory Visit (INDEPENDENT_AMBULATORY_CARE_PROVIDER_SITE_OTHER): Payer: Medicare Other | Admitting: *Deleted

## 2011-08-07 DIAGNOSIS — I4891 Unspecified atrial fibrillation: Secondary | ICD-10-CM

## 2011-08-07 DIAGNOSIS — Z7901 Long term (current) use of anticoagulants: Secondary | ICD-10-CM

## 2011-08-07 LAB — POCT INR: INR: 1.8

## 2011-08-28 ENCOUNTER — Ambulatory Visit (INDEPENDENT_AMBULATORY_CARE_PROVIDER_SITE_OTHER): Payer: Medicare Other | Admitting: *Deleted

## 2011-08-28 DIAGNOSIS — Z7901 Long term (current) use of anticoagulants: Secondary | ICD-10-CM

## 2011-08-28 DIAGNOSIS — I4891 Unspecified atrial fibrillation: Secondary | ICD-10-CM

## 2011-08-28 LAB — POCT INR: INR: 2.9

## 2011-09-25 ENCOUNTER — Ambulatory Visit (INDEPENDENT_AMBULATORY_CARE_PROVIDER_SITE_OTHER): Payer: Medicare Other | Admitting: *Deleted

## 2011-09-25 DIAGNOSIS — I4891 Unspecified atrial fibrillation: Secondary | ICD-10-CM

## 2011-09-25 DIAGNOSIS — Z7901 Long term (current) use of anticoagulants: Secondary | ICD-10-CM

## 2011-09-25 LAB — POCT INR: INR: 2.9

## 2011-10-05 ENCOUNTER — Encounter: Payer: Self-pay | Admitting: Cardiovascular Disease

## 2011-10-05 ENCOUNTER — Ambulatory Visit (INDEPENDENT_AMBULATORY_CARE_PROVIDER_SITE_OTHER): Payer: Medicare Other | Admitting: Cardiovascular Disease

## 2011-10-05 VITALS — BP 165/70 | HR 60 | Ht 64.0 in | Wt 138.0 lb

## 2011-10-05 DIAGNOSIS — I701 Atherosclerosis of renal artery: Secondary | ICD-10-CM

## 2011-10-05 NOTE — Progress Notes (Signed)
History of Present Illness: 78 yo female with history of HTN, atrial fibrillation on chronic coumadin therapy, renal dysfunction, PAD who is here today for PV follow up. I saw her  In May 2012 for evaluation of possible renal artery stenosis. Her cardiac issues are followed by Dr. Andee Lineman. She had a CT angiogram at [facility] in January 2012 that suggested at least 50 % bilateral renal artery stenosis. She did have renal insufficiency in the hospital this spring. HTN has been well controlled on medical therapy. We elected to hold off on renal artery angiogram as she had multiple other issues at that time including ongoing nausea. Renal artery dopplers 04/09/11 with less than 59% bilateral renal artery stenosis based on velocities. Her most recent creatinine was normal in October   She tells me today that she is feeling well. Her BP has been very well controlled at home with blood pressures in the 120s to 140s. . She has been seen by Dr. Allena Katz in Neprhology and has been told that her renal function is stable. Last BMET in our system shows creatinine 1.1 in October 2012. No complaints today.   Primary Care Physician: Dr. Sherril Croon in [place].   Last Lipid Profile:  Past Medical History  Diagnosis Date  . Sinoatrial node dysfunction      SINUS BRADYCARDIA  . Hypertension     unspecified  . Atrial fibrillation   . Encounter for long-term (current) use of other medications     COUMADIN THERAPY  . Lower extremity weakness   . Bilateral renal artery stenosis     MODERATE by dopplers August 2012.  . Sinus bradycardia     Past Surgical History  Procedure Date  . Knee arthroscopy   . Cardioversion     Current Outpatient Prescriptions  Medication Sig Dispense Refill  . amiodarone (PACERONE) 200 MG tablet Take 1/2 tab (100mg ) daily      . aspirin 81 MG tablet Take 81 mg by mouth daily.        Marland Kitchen dextromethorphan-guaiFENesin (MUCINEX DM) 30-600 MG per 12 hr tablet Take 1 tablet by mouth 2 (two) times  daily as needed.       . diltiazem (CARDIZEM CD) 120 MG 24 hr capsule Take 120 mg by mouth at bedtime.       Marland Kitchen diltiazem (CARDIZEM CD) 240 MG 24 hr capsule Take 240 mg by mouth daily. At noon      . glyBURIDE-metformin (GLUCOVANCE) 2.5-500 MG per tablet Take 1 tablet by mouth daily with breakfast.        . labetalol (NORMODYNE) 200 MG tablet TAKE 1 TABLET BY MOUTH TWICE A DAY (10AM AND 10PM)  60 tablet  6  . lovastatin (MEVACOR) 20 MG tablet Take 20 mg by mouth at bedtime.        Marland Kitchen omeprazole (PRILOSEC) 40 MG capsule Take 1 tablet by mouth Daily.      Marland Kitchen oxyCODONE-acetaminophen (PERCOCET) 5-325 MG per tablet Take 1 tablet by mouth every 4 (four) hours as needed.        . prazosin (MINIPRESS) 1 MG capsule Take one by mouth twice daily        . promethazine (PHENERGAN) 25 MG tablet Take 25 mg by mouth every 6 (six) hours as needed.        . warfarin (COUMADIN) 5 MG tablet Take 1 tablet (5 mg total) by mouth as directed. Use as directed  45 tablet  2    No Known Allergies  History   Social History  . Marital Status: Married    Spouse Name: N/A    Number of Children: N/A  . Years of Education: N/A   Occupational History  . RETIRED    Social History Main Topics  . Smoking status: Never Smoker   . Smokeless tobacco: Never Used  . Alcohol Use: No  . Drug Use: No  . Sexually Active: Not on file   Other Topics Concern  . Not on file   Social History Narrative  . No narrative on file    Family History  Problem Relation Age of Onset  . Cancer Other   . Heart failure Other   . Cirrhosis Other     Review of Systems:  As stated in the HPI and otherwise negative.   BP 165/70  Pulse 60  Ht 5\' 4"  (1.626 m)  Wt 138 lb (62.596 kg)  BMI 23.69 kg/m2  Physical Examination: General: Well developed, well nourished, NAD HEENT: OP clear, mucus membranes moist SKIN: warm, dry. No rashes. Neuro: No focal deficits Musculoskeletal: Muscle strength 5/5 all ext Psychiatric: Mood and  affect normal Neck: No JVD, no carotid bruits, no thyromegaly, no lymphadenopathy. Lungs:Clear bilaterally, no wheezes, rhonci, crackles Cardiovascular: Regular rate and rhythm. No murmurs, gallops or rubs. Abdomen:Soft. Bowel sounds present. Non-tender.  Extremities: No lower extremity edema. Pulses are 2 + in the bilateral DP/PT.

## 2011-10-05 NOTE — Assessment & Plan Note (Signed)
She is known to have moderate bilateral renal artery stenosis with velocities in the 0-59% stenosis range in August 2012. She has stable renal function. Her BP is well controlled at home. No changes at this time. Will repeat renal artery dopplers in 6 months. She will continue to follow with Nephrology. I am hopeful that we can continue conservative management of her renal artery stenosis.

## 2011-10-05 NOTE — Patient Instructions (Signed)
Your physician wants you to follow-up in: 6 months.  You will receive a reminder letter in the mail two months in advance. If you don't receive a letter, please call our office to schedule the follow-up appointment.  Your physician has requested that you have a renal artery duplex. During this test, an ultrasound is used to evaluate blood flow to the kidneys. Allow one hour for this exam. Do not eat after midnight the day before and avoid carbonated beverages. Take your medications as you usually do. To be done in 6 months on day of appt with Dr. Clifton James

## 2011-10-23 ENCOUNTER — Ambulatory Visit (INDEPENDENT_AMBULATORY_CARE_PROVIDER_SITE_OTHER): Payer: Medicare Other | Admitting: *Deleted

## 2011-10-23 DIAGNOSIS — I4891 Unspecified atrial fibrillation: Secondary | ICD-10-CM

## 2011-10-23 DIAGNOSIS — Z7901 Long term (current) use of anticoagulants: Secondary | ICD-10-CM

## 2011-10-23 LAB — POCT INR: INR: 2.4

## 2011-11-10 ENCOUNTER — Other Ambulatory Visit: Payer: Self-pay | Admitting: *Deleted

## 2011-11-10 MED ORDER — LABETALOL HCL 200 MG PO TABS: 200.0000 mg | ORAL_TABLET | Freq: Two times a day (BID) | ORAL | Status: DC

## 2011-11-20 ENCOUNTER — Ambulatory Visit (INDEPENDENT_AMBULATORY_CARE_PROVIDER_SITE_OTHER): Payer: Medicare Other | Admitting: *Deleted

## 2011-11-20 DIAGNOSIS — I4891 Unspecified atrial fibrillation: Secondary | ICD-10-CM

## 2011-11-20 DIAGNOSIS — Z7901 Long term (current) use of anticoagulants: Secondary | ICD-10-CM

## 2011-12-22 ENCOUNTER — Ambulatory Visit (INDEPENDENT_AMBULATORY_CARE_PROVIDER_SITE_OTHER): Payer: Medicare Other | Admitting: *Deleted

## 2011-12-22 DIAGNOSIS — I4891 Unspecified atrial fibrillation: Secondary | ICD-10-CM

## 2011-12-22 DIAGNOSIS — Z7901 Long term (current) use of anticoagulants: Secondary | ICD-10-CM

## 2011-12-25 ENCOUNTER — Other Ambulatory Visit (HOSPITAL_COMMUNITY): Payer: Self-pay | Admitting: Internal Medicine

## 2011-12-25 DIAGNOSIS — IMO0002 Reserved for concepts with insufficient information to code with codable children: Secondary | ICD-10-CM

## 2011-12-30 ENCOUNTER — Ambulatory Visit (HOSPITAL_COMMUNITY)
Admission: RE | Admit: 2011-12-30 | Discharge: 2011-12-30 | Disposition: A | Discharge status: Home / Self Care | Payer: Medicare Other | Source: Ambulatory Visit | Attending: Internal Medicine | Admitting: Internal Medicine

## 2011-12-30 ENCOUNTER — Telehealth (HOSPITAL_COMMUNITY): Payer: Self-pay

## 2011-12-30 DIAGNOSIS — IMO0002 Reserved for concepts with insufficient information to code with codable children: Secondary | ICD-10-CM

## 2011-12-30 NOTE — Telephone Encounter (Signed)
Called [facility] and left a message for pt to be set up for an mri lspine [id number]

## 2011-12-31 ENCOUNTER — Telehealth (HOSPITAL_COMMUNITY): Payer: Self-pay

## 2011-12-31 NOTE — Telephone Encounter (Signed)
I spoke with [name] at [facility] and scheduled pt for her MRI lumbar spine w/o  She has an appt 01-07-12 @ 145.  Pt to arrive at the main ent.Marland Kitchen

## 2012-01-01 ENCOUNTER — Other Ambulatory Visit: Payer: Self-pay | Admitting: Cardiology

## 2012-01-01 ENCOUNTER — Encounter: Payer: Self-pay | Admitting: Cardiology

## 2012-01-01 ENCOUNTER — Telehealth: Payer: Self-pay | Admitting: Cardiology

## 2012-01-01 ENCOUNTER — Ambulatory Visit (INDEPENDENT_AMBULATORY_CARE_PROVIDER_SITE_OTHER): Payer: Medicare Other | Admitting: Cardiology

## 2012-01-01 ENCOUNTER — Ambulatory Visit (INDEPENDENT_AMBULATORY_CARE_PROVIDER_SITE_OTHER): Payer: Medicare Other | Admitting: *Deleted

## 2012-01-01 VITALS — BP 153/73 | HR 51 | Ht 64.0 in | Wt 131.8 lb

## 2012-01-01 DIAGNOSIS — I701 Atherosclerosis of renal artery: Secondary | ICD-10-CM

## 2012-01-01 DIAGNOSIS — I420 Dilated cardiomyopathy: Secondary | ICD-10-CM

## 2012-01-01 DIAGNOSIS — Z7901 Long term (current) use of anticoagulants: Secondary | ICD-10-CM

## 2012-01-01 DIAGNOSIS — K3184 Gastroparesis: Secondary | ICD-10-CM

## 2012-01-01 DIAGNOSIS — I059 Rheumatic mitral valve disease, unspecified: Secondary | ICD-10-CM

## 2012-01-01 DIAGNOSIS — N289 Disorder of kidney and ureter, unspecified: Secondary | ICD-10-CM

## 2012-01-01 DIAGNOSIS — I340 Nonrheumatic mitral (valve) insufficiency: Secondary | ICD-10-CM

## 2012-01-01 DIAGNOSIS — I4891 Unspecified atrial fibrillation: Secondary | ICD-10-CM

## 2012-01-01 DIAGNOSIS — I10 Essential (primary) hypertension: Secondary | ICD-10-CM

## 2012-01-01 DIAGNOSIS — I5033 Acute on chronic diastolic (congestive) heart failure: Secondary | ICD-10-CM

## 2012-01-01 DIAGNOSIS — I428 Other cardiomyopathies: Secondary | ICD-10-CM

## 2012-01-01 LAB — POCT INR: INR: 2.8

## 2012-01-01 NOTE — Patient Instructions (Addendum)
Your physician recommends that you schedule a follow-up appointment in: 6 months. You will receive a reminder letter in the mail in about 4 months reminding you to call and schedule your appointment. If you don't receive this letter, please contact our office.   Your physician recommends that you continue on your current medications as directed. Please refer to the Current Medication list given to you today.   You will be scheduled for a Gastric Emptying Study to be done at [facility]. This will be scheduled by [facility].

## 2012-01-01 NOTE — Telephone Encounter (Signed)
Gastric Emptying Study -do not eat or drink 6 hours before the procedure. -   Thursday, June 6th, 2013    Checking percert

## 2012-01-01 NOTE — Telephone Encounter (Signed)
Pt has Medicare and Medicare Supplement.  No precert required.

## 2012-01-02 ENCOUNTER — Other Ambulatory Visit: Payer: Self-pay | Admitting: Cardiology

## 2012-01-08 ENCOUNTER — Telehealth (HOSPITAL_COMMUNITY): Payer: Self-pay

## 2012-01-08 NOTE — Telephone Encounter (Signed)
I spoke with [name] to give her the resluts of her current MRI.  She stated at this time she wanted to wait it out.  She stated that she would call if she changed her mind

## 2012-01-26 ENCOUNTER — Telehealth: Payer: Self-pay | Admitting: Cardiology

## 2012-01-26 NOTE — Telephone Encounter (Signed)
Discussed below.  States that husband says she is getting sick after taking her morning medicines.  Stated this was also happening while in the hospital.  Husband thinks the Amiodarone is the problem.  Hosp d/c summary has her taking Amiodarone 100mg  twice a day.  Our med list has daily.  Please advise.

## 2012-01-26 NOTE — Telephone Encounter (Signed)
Received telephone call today from Cathlyn Parsons (Nurse at the [facility]) ext. [phone]. Family Is requesting that patient be seen asap. States that the Amiodarone 100 mg is making her vomit. Nurse at the Nursing Center states that she gave her Phenergan today.

## 2012-01-27 MED ORDER — AMIODARONE HCL 200 MG PO TABS: 100.0000 mg | ORAL_TABLET | Freq: Every evening | ORAL | Status: DC

## 2012-01-27 NOTE — Addendum Note (Signed)
Addended by: Lesle Chris on: 01/27/2012 05:51 PM   Modules accepted: Orders

## 2012-01-27 NOTE — Telephone Encounter (Signed)
Notified [name] at [facility].  Patient does have OV scheduled for July 11.

## 2012-01-27 NOTE — Telephone Encounter (Signed)
First try to take amiodarone in PM with meals - if still problem, then D/C amiodarone for now. Make sure patient has office visit in 6-8 weeks for F/U change in meds.

## 2012-01-28 ENCOUNTER — Telehealth: Payer: Self-pay | Admitting: *Deleted

## 2012-01-28 NOTE — Telephone Encounter (Signed)
Message copied by Lesle Chris on Thu Jan 28, 2012  1:50 PM ------      Message from: Learta Codding      Created: Tue Jan 26, 2012  8:54 PM       Normal study

## 2012-01-28 NOTE — Telephone Encounter (Signed)
Notes Recorded by Lesle Chris, LPN on 04/07/5620 at 1:50 PM Burna Mortimer at [facility] (ext [phone]) notified of below. Already has OV scheduled for July 11

## 2012-01-30 NOTE — Assessment & Plan Note (Signed)
Blood pressure now well controlled.  Continue medical therapy

## 2012-01-30 NOTE — Assessment & Plan Note (Signed)
Patient seen in [place].  Initial CT angiogram here at 2012 suggestive of at least 80% bilateral renal artery stenosis.  Followup renal artery Dopplers August 2012 percent to 60% bilateral renal artery stenosis based on velocities creatinine stable .  Recommendations were given for medical therapy.

## 2012-01-30 NOTE — Assessment & Plan Note (Signed)
No complications.  No problems with bleeding.  Patient recently tripped and had an MRI done of the back.  No hematoma was noted.

## 2012-01-30 NOTE — Assessment & Plan Note (Addendum)
The patient remained in normal sinus rhythm.  Status post prior cardioversion and tachycardia-induced cardiomyopathy.  Ejection fraction normalized.  Patient stable on amiodarone and anticoagulation therapy.

## 2012-01-30 NOTE — Progress Notes (Signed)
Peyton Bottoms, MD, [facility] ABIM Board Certified in Adult Cardiovascular Medicine,Internal Medicine and Critical Care Medicine    CC: followup patient with difficult to control blood pressure.  HPI:  The patient has a history of acute on chronic diastolic heart failure as well as well S atrial fibrillation with rapid ventricular response  She also had a tachycardia-induced cardiomyopathy.  She has renal artery stenosis which is felt not to be hemodynamically significant and probably not contributing to her hypertension.  The patient also has not preserved renal function creatinine of 1.15.  At the time of visitation with heart failure she had renal insufficiency and this has resolved.  She saw Dr. Sanjuana Kava increased our clinic.  He recommended conservative management for her renal artery stenosis. The patient reports no chest pain shots of breath orthopnea or PND.  The patient several weeks ago tripped and had a fall.  She had no bleeding complications however.  She is complaining of low back pain and had an MRI done which showed no significant abnormalities. She also complains of some dysphagia and fullness and bloating after meals possibly consistent with functional dyspepsia or gastroparesis in this setting of her diabetes mellitus. Otherwise regarding from a cardiovascular standpoint the patient is stable   PMH: reviewed and listed in Problem List in Electronic Records (and see below) Past Medical History  Diagnosis Date  . Sinoatrial node dysfunction      SINUS BRADYCARDIA  . Hypertension     unspecified  . Atrial fibrillation   . Encounter for long-term (current) use of other medications     COUMADIN THERAPY  . Lower extremity weakness   . Bilateral renal artery stenosis     MODERATE by dopplers August 2012.  . Sinus bradycardia    Past Surgical History  Procedure Date  . Knee arthroscopy   . Cardioversion     Allergies/SH/FHX : available in Electronic Records for review  No  Known Allergies History   Social History  . Marital Status: Married    Spouse Name: N/A    Number of Children: N/A  . Years of Education: N/A   Occupational History  . RETIRED    Social History Main Topics  . Smoking status: Never Smoker   . Smokeless tobacco: Never Used  . Alcohol Use: No  . Drug Use: No  . Sexually Active: Not on file   Other Topics Concern  . Not on file   Social History Narrative  . No narrative on file   Family History  Problem Relation Age of Onset  . Cancer Other   . Heart failure Other   . Cirrhosis Other     Medications: Current Outpatient Prescriptions  Medication Sig Dispense Refill  . diltiazem (CARDIZEM CD) 120 MG 24 hr capsule Take 120 mg by mouth at bedtime.       Marland Kitchen glipiZIDE (GLUCOTROL XL) 2.5 MG 24 hr tablet Take 2.5 mg by mouth daily.       Marland Kitchen HYDROcodone-acetaminophen (VICODIN) 5-500 MG per tablet Take 1 tablet by mouth every 6 (six) hours as needed.       . labetalol (NORMODYNE) 200 MG tablet Take 1 tablet (200 mg total) by mouth 2 (two) times daily.  180 tablet  3  . lovastatin (MEVACOR) 20 MG tablet Take 20 mg by mouth at bedtime.        . metFORMIN (GLUCOPHAGE) 500 MG tablet Take 500 mg by mouth daily with breakfast.       .  omeprazole (PRILOSEC) 40 MG capsule Take 1 tablet by mouth Daily.      Marland Kitchen oxyCODONE-acetaminophen (PERCOCET) 5-325 MG per tablet Take 1 tablet by mouth every 4 (four) hours as needed.        . prazosin (MINIPRESS) 1 MG capsule Take one by mouth twice daily        . warfarin (COUMADIN) 5 MG tablet Take 1 tablet (5 mg total) by mouth as directed. Use as directed  45 tablet  2  . DISCONTD: diltiazem (CARDIZEM CD) 240 MG 24 hr capsule Take 240 mg by mouth daily. At noon      . amiodarone (PACERONE) 200 MG tablet Take 0.5 tablets (100 mg total) by mouth every evening.        ROS: No nausea or vomiting. No fever or chills.No melena or hematochezia.No bleeding.No claudication  Physical Exam: BP 153/73  Pulse 51   Ht 5\' 4"  (1.626 m)  Wt 131 lb 12.8 oz (59.784 kg)  BMI 22.62 kg/m2 General:well-nourished white female in no distress. Neck:normal carotid upstroke and no carotid bruit.  No thyromegaly.  Nonnodular thyroid.  JVP is 7 cm Lungs:clear breath sounds bilaterally without wheezing. Cardiac:regular rate and rhythm with normal S1-S2.  No murmur rubs or gallops Vascular:no edema.  Normal distal pulses Skin:warm and dry Physcologic:normal affect  12lead ZOX:WRUEA bradycardia, left ventricular hypertrophy and nonspecific ST-T wave changes. Limited bedside ECHO:N/A No images are attached to the encounter.   Assessment and Plan  Acute on chronic diastolic heart failure No recurrent heart failure symptoms.  Continue current medical regimen.  Acute renal insufficiency Resolved.  ATHEROSCLEROSIS OF RENAL ARTERY Patient seen in [place].  Initial CT angiogram here at 2012 suggestive of at least 80% bilateral renal artery stenosis.  Followup renal artery Dopplers August 2012 percent to 60% bilateral renal artery stenosis based on velocities creatinine stable .  Recommendations were given for medical therapy.  ATRIAL FIBRILLATION The patient remained in normal sinus rhythm.  Status post prior cardioversion and tachycardia-induced cardiomyopathy.  Ejection fraction normalized.  Patient stable on amiodarone and anticoagulation therapy.  Dilated cardiomyopathy No third heart failure symptoms resolved.  Dysphagia Patient reports symptoms of functional dyspepsia.  I scheduled her for a gastric imaging study.  She could have diabetic gastroparesis.  Chronic anticoagulation No complications.  No problems with bleeding.  Patient recently tripped and had an MRI done of the back.  No hematoma was noted.  Mitral regurgitation No significant mitral regurgitation by physical examination.  HYPERTENSION, UNSPECIFIED Blood pressure now well controlled.  Continue medical therapy   Patient Active Problem  List  Diagnosis  . HYPERTENSION, UNSPECIFIED  . ATRIAL FIBRILLATION  . SINUS BRADYCARDIA  . WEAKNESS  . ATHEROSCLEROSIS OF RENAL ARTERY  . COMPRESSION FRACTURE, SPINE  . Encounter for long-term (current) use of anticoagulants  . Dilated cardiomyopathy  . Acute renal insufficiency  . Mitral regurgitation  . PFO (patent foramen ovale)  . Chronic anticoagulation  . Acute on chronic diastolic heart failure  . Dysphagia

## 2012-01-30 NOTE — Assessment & Plan Note (Signed)
No recurrent heart failure symptoms.  Continue current medical regimen.

## 2012-01-30 NOTE — Assessment & Plan Note (Signed)
Resolved

## 2012-01-30 NOTE — Assessment & Plan Note (Signed)
Patient reports symptoms of functional dyspepsia.  I scheduled her for a gastric imaging study.  She could have diabetic gastroparesis.

## 2012-01-30 NOTE — Assessment & Plan Note (Signed)
No significant mitral regurgitation by physical examination.

## 2012-01-30 NOTE — Assessment & Plan Note (Signed)
No third heart failure symptoms resolved.

## 2012-02-08 ENCOUNTER — Telehealth: Payer: Self-pay | Admitting: Cardiology

## 2012-02-08 LAB — POCT INR: INR: 3

## 2012-02-08 NOTE — Telephone Encounter (Signed)
Pt 35.7 inr 3.0 2.5 mg

## 2012-02-09 ENCOUNTER — Ambulatory Visit (INDEPENDENT_AMBULATORY_CARE_PROVIDER_SITE_OTHER): Payer: Medicare Other | Admitting: *Deleted

## 2012-02-09 DIAGNOSIS — Z7901 Long term (current) use of anticoagulants: Secondary | ICD-10-CM

## 2012-02-09 DIAGNOSIS — I4891 Unspecified atrial fibrillation: Secondary | ICD-10-CM

## 2012-02-09 NOTE — Telephone Encounter (Signed)
Called Lois @ [facility].  Orders given.  See coumadin note.

## 2012-02-18 ENCOUNTER — Encounter (INDEPENDENT_AMBULATORY_CARE_PROVIDER_SITE_OTHER): Payer: Medicare Other

## 2012-02-18 ENCOUNTER — Ambulatory Visit (INDEPENDENT_AMBULATORY_CARE_PROVIDER_SITE_OTHER): Payer: Medicare Other | Admitting: Physician Assistant

## 2012-02-18 VITALS — BP 154/65 | HR 56 | Ht 64.0 in | Wt 126.8 lb

## 2012-02-18 DIAGNOSIS — I420 Dilated cardiomyopathy: Secondary | ICD-10-CM

## 2012-02-18 DIAGNOSIS — I4891 Unspecified atrial fibrillation: Secondary | ICD-10-CM

## 2012-02-18 DIAGNOSIS — N289 Disorder of kidney and ureter, unspecified: Secondary | ICD-10-CM

## 2012-02-18 DIAGNOSIS — I428 Other cardiomyopathies: Secondary | ICD-10-CM

## 2012-02-18 DIAGNOSIS — Z7901 Long term (current) use of anticoagulants: Secondary | ICD-10-CM

## 2012-02-18 MED ORDER — LABETALOL HCL 200 MG PO TABS: 100.0000 mg | ORAL_TABLET | Freq: Two times a day (BID) | ORAL | Status: DC

## 2012-02-18 MED ORDER — CARVEDILOL 6.25 MG PO TABS: 6.2500 mg | ORAL_TABLET | Freq: Two times a day (BID) | ORAL | Status: DC

## 2012-02-18 NOTE — Assessment & Plan Note (Signed)
Since resolved, with most recent creatinine 0.8 by followup labs in June. Of note, patient has documented 60% bilateral RAS, by Doppler study 2012. Conservative management has been recommended.

## 2012-02-18 NOTE — Assessment & Plan Note (Signed)
Maintaining NSR on very low-dose amiodarone. Patient on chronic Coumadin, followed here in our clinic, and due for a followup INR today. She had an INR 3.0 in July 1. Of note, LFTs and TSH within normal limits, by recent blood work.

## 2012-02-18 NOTE — Patient Instructions (Addendum)
   Decrease Labetalol to 100mg  twice a day  X 1 week, then STOP  Begin Coreg 6.25mg  - start with 1/2 tab (3.125mg ) twice a day  X 1 week, then increase to 1 tab twice a day there after  Nurse visit in 2 weeks for BP & HR check Your physician wants you to follow up in: 6 months.  You will receive a reminder letter in the mail one-two months in advance.  If you don't receive a letter, please call our office to schedule the follow up appointment

## 2012-02-18 NOTE — Progress Notes (Signed)
Primary Cardiologist: Lewayne Bunting, MD   HPI: Patient presents for scheduled followup.  She was recently discharged from [facility], after completing a three-week rehabilitation course. She reports having been diagnosed with vertebral fracture, status post fall, but with no associated syncope.  From a cardiac standpoint, she denies symptoms of decompensated heart failure: She denies PND, orthopnea, LE edema, or significant DOE. She has history of PAF, and denies tachycardia palpitations. She remains in NSR by EKG, on low-dose amiodarone.  When last seen, she was referred for a gastric emptying study, by Dr. Andee Lineman, and this was normal. She continues to report diminished appetite, however.  Of note, she presents with documented heart rate readings at home in the 55-61 range. Systolic BP has been as high as 160.  No Known Allergies  Current Outpatient Prescriptions  Medication Sig Dispense Refill  . amiodarone (PACERONE) 200 MG tablet Take 0.5 tablets (100 mg total) by mouth every evening.      . Calcium Carbonate-Vitamin D (CALTRATE 600+D PO) Take 1 tablet by mouth daily.      Marland Kitchen diltiazem (CARDIZEM CD) 120 MG 24 hr capsule Take 120 mg by mouth at bedtime.       Marland Kitchen diltiazem (CARDIZEM CD) 240 MG 24 hr capsule Take 240 mg by mouth every morning.      Marland Kitchen glipiZIDE (GLUCOTROL XL) 2.5 MG 24 hr tablet Take 2.5 mg by mouth daily.       Marland Kitchen HYDROcodone-acetaminophen (VICODIN) 5-500 MG per tablet Take 1 tablet by mouth at bedtime as needed.       . labetalol (NORMODYNE) 200 MG tablet Take 1 tablet (200 mg total) by mouth 2 (two) times daily.  180 tablet  3  . lovastatin (MEVACOR) 20 MG tablet Take 20 mg by mouth at bedtime.        . metFORMIN (GLUCOPHAGE) 500 MG tablet Take 500 mg by mouth daily with breakfast.       . Multiple Vitamin (MULTIVITAMIN WITH MINERALS) TABS Take 1 tablet by mouth daily.      . prazosin (MINIPRESS) 2 MG capsule Take 2 mg by mouth 2 (two) times daily.      Marland Kitchen  warfarin (COUMADIN) 5 MG tablet Take 1 tablet (5 mg total) by mouth as directed. Use as directed  45 tablet  2    Past Medical History  Diagnosis Date  . Sinoatrial node dysfunction      SINUS BRADYCARDIA  . Hypertension     unspecified  . Atrial fibrillation   . Encounter for long-term (current) use of other medications     COUMADIN THERAPY  . Lower extremity weakness   . Bilateral renal artery stenosis     MODERATE by dopplers August 2012.  . Sinus bradycardia     Past Surgical History  Procedure Date  . Knee arthroscopy   . Cardioversion     History   Social History  . Marital Status: Married    Spouse Name: N/A    Number of Children: N/A  . Years of Education: N/A   Occupational History  . RETIRED    Social History Main Topics  . Smoking status: Never Smoker   . Smokeless tobacco: Never Used  . Alcohol Use: No  . Drug Use: No  . Sexually Active: Not on file   Other Topics Concern  . Not on file   Social History Narrative  . No narrative on file    Family History  Problem Relation Age of  Onset  . Cancer Other   . Heart failure Other   . Cirrhosis Other     ROS: no nausea, vomiting; no fever, chills; no melena, hematochezia; no claudication  PHYSICAL EXAM: BP 154/65  Pulse 56  Ht 5\' 4"  (1.626 m)  Wt 126 lb 12.8 oz (57.516 kg)  BMI 21.77 kg/m2 GENERAL: 78 year old female, sitting upright; NAD HEENT: NCAT, PERRLA, EOMI; sclera clear; no xanthelasma NECK: palpable bilateral carotid pulses, no bruits; no JVD; no TM LUNGS: CTA bilaterally CARDIAC: RRR (S1, S2); no significant murmurs; no rubs or gallops ABDOMEN: soft, non-tender; intact BS EXTREMETIES: intact distal pulses; no significant peripheral edema SKIN: warm/dry; no obvious rash/lesions MUSCULOSKELETAL: no joint deformity NEURO: no focal deficit; NL affect   EKG: reviewed and available in Electronic Records   ASSESSMENT & PLAN:  ATRIAL FIBRILLATION Maintaining NSR on very low-dose  amiodarone. Patient on chronic Coumadin, followed here in our clinic, and due for a followup INR today. She had an INR 3.0 in July 1. Of note, LFTs and TSH within normal limits, by recent blood work.  Dilated cardiomyopathy Recommendation is to substitute labetalol with carvedilol, given patient's history of NICM. Will taper labetalol to 100 twice a day (x1), and then stop. Concomitantly, we'll introduce carvedilol at 3.125 twice a day (x1 week), and then increase to 6.25 twice a day. Will schedule RN visit in 2 weeks for vitals check. For now, will continue current dose of Cardizem, given documented history of LVF normalization (EF 60-65%), by 2-D echo 2/12.  Acute renal insufficiency Since resolved, with most recent creatinine 0.8 by followup labs in June. Of note, patient has documented 60% bilateral RAS, by Doppler study 2012. Conservative management has been recommended.    Gene Juliyah Mergen, PAC

## 2012-02-18 NOTE — Assessment & Plan Note (Signed)
Recommendation is to substitute labetalol with carvedilol, given patient's history of NICM. Will taper labetalol to 100 twice a day (x1), and then stop. Concomitantly, we'll introduce carvedilol at 3.125 twice a day (x1 week), and then increase to 6.25 twice a day. Will schedule RN visit in 2 weeks for vitals check. For now, will continue current dose of Cardizem, given documented history of LVF normalization (EF 60-65%), by 2-D echo 2/12.

## 2012-02-19 ENCOUNTER — Ambulatory Visit (INDEPENDENT_AMBULATORY_CARE_PROVIDER_SITE_OTHER): Payer: Medicare Other | Admitting: *Deleted

## 2012-02-19 DIAGNOSIS — Z7901 Long term (current) use of anticoagulants: Secondary | ICD-10-CM

## 2012-02-19 DIAGNOSIS — I4891 Unspecified atrial fibrillation: Secondary | ICD-10-CM

## 2012-03-04 ENCOUNTER — Ambulatory Visit (INDEPENDENT_AMBULATORY_CARE_PROVIDER_SITE_OTHER): Payer: Medicare Other | Admitting: *Deleted

## 2012-03-04 ENCOUNTER — Other Ambulatory Visit: Payer: Self-pay | Admitting: *Deleted

## 2012-03-04 ENCOUNTER — Telehealth: Payer: Self-pay | Admitting: *Deleted

## 2012-03-04 VITALS — BP 172/69 | HR 51

## 2012-03-04 DIAGNOSIS — Z79899 Other long term (current) drug therapy: Secondary | ICD-10-CM

## 2012-03-04 DIAGNOSIS — I10 Essential (primary) hypertension: Secondary | ICD-10-CM

## 2012-03-04 DIAGNOSIS — I4891 Unspecified atrial fibrillation: Secondary | ICD-10-CM

## 2012-03-04 DIAGNOSIS — Z7901 Long term (current) use of anticoagulants: Secondary | ICD-10-CM

## 2012-03-04 MED ORDER — LISINOPRIL 10 MG PO TABS: 10.0000 mg | ORAL_TABLET | Freq: Two times a day (BID) | ORAL | Status: DC

## 2012-03-04 MED ORDER — LISINOPRIL 5 MG PO TABS: 5.0000 mg | ORAL_TABLET | Freq: Two times a day (BID) | ORAL | Status: DC

## 2012-03-04 NOTE — Progress Notes (Signed)
Patient notified of below.  Will fax order to [facility] now.  Will send new rx to [facility].

## 2012-03-04 NOTE — Progress Notes (Signed)
Here for blood pressure check this morning.  States she took am meds around 7:30.  Reviewed medications at this time.

## 2012-03-04 NOTE — Progress Notes (Signed)
Vitals reviewed. Start Lisinopril 5 bid, and check BMET 1 week

## 2012-03-04 NOTE — Telephone Encounter (Signed)
Patient notified of addition of Lisinopril 5mg  twice a day  & BMET in one week.  Also, asked patient to call with update on readings as well in one week.

## 2012-03-25 ENCOUNTER — Ambulatory Visit (INDEPENDENT_AMBULATORY_CARE_PROVIDER_SITE_OTHER): Payer: Medicare Other | Admitting: *Deleted

## 2012-03-25 DIAGNOSIS — Z7901 Long term (current) use of anticoagulants: Secondary | ICD-10-CM

## 2012-03-25 DIAGNOSIS — I4891 Unspecified atrial fibrillation: Secondary | ICD-10-CM

## 2012-04-15 ENCOUNTER — Encounter (INDEPENDENT_AMBULATORY_CARE_PROVIDER_SITE_OTHER): Payer: Medicare Other

## 2012-04-15 ENCOUNTER — Ambulatory Visit (INDEPENDENT_AMBULATORY_CARE_PROVIDER_SITE_OTHER): Payer: Medicare Other | Admitting: Cardiovascular Disease

## 2012-04-15 ENCOUNTER — Encounter: Payer: Self-pay | Admitting: Cardiovascular Disease

## 2012-04-15 VITALS — BP 143/64 | HR 51 | Ht 64.0 in | Wt 125.8 lb

## 2012-04-15 DIAGNOSIS — I701 Atherosclerosis of renal artery: Secondary | ICD-10-CM

## 2012-04-15 NOTE — Patient Instructions (Signed)
Your physician wants you to follow-up in:  12 months. You will receive a reminder letter in the mail two months in advance. If you don't receive a letter, please call our office to schedule the follow-up appointment.  Your physician has requested that you have a renal artery duplex. During this test an ultrasound is used to evaluate blood flow to the kidneys. Allow one hour for this exam. Do not eat after midnight the day before and avoid carbonated beverages. Take your medications as you usually do.To be done in September 2014 on day of appt with Dr. Clifton James

## 2012-04-15 NOTE — Progress Notes (Signed)
History of Present Illness: 78 yo female with history of HTN, atrial fibrillation on chronic coumadin therapy, renal dysfunction, PAD who is here today for PV follow up. I saw her In May 2012 for evaluation of possible renal artery stenosis. Her cardiac issues are followed by Dr. Andee Lineman. She had a CT angiogram at [facility] in January 2012 that suggested at least 50 % bilateral renal artery stenosis. She did have renal insufficiency in the hospital this spring. HTN has been well controlled on medical therapy. We elected to hold off on renal artery angiogram as she had multiple other issues at that time including ongoing nausea. Renal artery dopplers 04/09/11 with less than 59% bilateral renal artery stenosis based on velocities. Her most recent creatinine was normal per records in June 2013. Renal artery dopplers today are stable with 1-59% right RAS and normal left renal artery velocities.   She tells me today that she is feeling well. Her BP has been very well controlled at home with blood pressures in the 120s to 140s. She has had some lower ext edema but this gets better at night. No pain in legs.   Primary Care Physician: Dr. Sherril Croon in [place].    Past Medical History  Diagnosis Date  . Sinoatrial node dysfunction      SINUS BRADYCARDIA  . Hypertension     unspecified  . Atrial fibrillation   . Encounter for long-term (current) use of other medications     COUMADIN THERAPY  . Lower extremity weakness   . Bilateral renal artery stenosis     MODERATE by dopplers August 2012.  . Sinus bradycardia     Past Surgical History  Procedure Date  . Knee arthroscopy   . Cardioversion     Current Outpatient Prescriptions  Medication Sig Dispense Refill  . amiodarone (PACERONE) 200 MG tablet Take 0.5 tablets (100 mg total) by mouth every evening.      . Calcium Carbonate-Vitamin D (CALTRATE 600+D PO) Take 1 tablet by mouth daily.      . carvedilol (COREG) 6.25 MG tablet Take 1 tablet (6.25 mg  total) by mouth 2 (two) times daily.  60 tablet  6  . diltiazem (CARDIZEM CD) 120 MG 24 hr capsule Take 120 mg by mouth at bedtime.       Marland Kitchen diltiazem (CARDIZEM CD) 240 MG 24 hr capsule Take 240 mg by mouth every morning.      Marland Kitchen HYDROcodone-acetaminophen (VICODIN) 5-500 MG per tablet Take 1 tablet by mouth at bedtime as needed.       Marland Kitchen lisinopril (PRINIVIL,ZESTRIL) 5 MG tablet Take 1 tablet (5 mg total) by mouth 2 (two) times daily.  60 tablet  6  . lovastatin (MEVACOR) 20 MG tablet Take 20 mg by mouth at bedtime.        . metFORMIN (GLUCOPHAGE) 500 MG tablet Take 500 mg by mouth daily with breakfast.       . Multiple Vitamin (MULTIVITAMIN WITH MINERALS) TABS Take 1 tablet by mouth daily.      . Multiple Vitamins-Minerals (ELDERTONIC PO) Take 2 teaspoons daily      . prazosin (MINIPRESS) 2 MG capsule Take 2 mg by mouth 2 (two) times daily.      Marland Kitchen warfarin (COUMADIN) 5 MG tablet Take 1 tablet (5 mg total) by mouth as directed. Use as directed  45 tablet  2    No Known Allergies  History   Social History  . Marital Status: Married  Spouse Name: N/A    Number of Children: N/A  . Years of Education: N/A   Occupational History  . RETIRED    Social History Main Topics  . Smoking status: Never Smoker   . Smokeless tobacco: Never Used  . Alcohol Use: No  . Drug Use: No  . Sexually Active: Not on file   Other Topics Concern  . Not on file   Social History Narrative  . No narrative on file    Family History  Problem Relation Age of Onset  . Cancer Other   . Heart failure Other   . Cirrhosis Other     Review of Systems:  As stated in the HPI and otherwise negative.   BP 143/64  Pulse 51  Ht 5\' 4"  (1.626 m)  Wt 125 lb 12.8 oz (57.063 kg)  BMI 21.59 kg/m2  SpO2 95%  Physical Examination: General: Well developed, well nourished, NAD HEENT: OP clear, mucus membranes moist SKIN: warm, dry. No rashes. Neuro: No focal deficits Musculoskeletal: Muscle strength 5/5 all  ext Psychiatric: Mood and affect normal Neck: No JVD, no carotid bruits, no thyromegaly, no lymphadenopathy. Lungs:Clear bilaterally, no wheezes, rhonci, crackles Cardiovascular: Regular rate and rhythm. No murmurs, gallops or rubs. Abdomen:Soft. Bowel sounds present. Non-tender.  Extremities: No lower extremity edema. Pulses are 1+ in the left DP/PT and right DP. Trace right PT. Handheld doppler with good signal in all vessels with dampened signal in the right PT.   Renal artery dopplers 04/15/12:  Normal caliber aorta. Normal bilateral kidney size. STable 1-59% right renal artery stenosis. Normal velocities left renal artery.   Assessment and Plan:   1. Bilateral renal artery stenosis: Mild to moderate RAS on right with normal velocities on left.  Continue conservative management. Repeat renal artery dopplers in one year. I do not think this has a significant hemodynamic affect given low grade disease.

## 2012-04-26 ENCOUNTER — Ambulatory Visit (INDEPENDENT_AMBULATORY_CARE_PROVIDER_SITE_OTHER): Payer: Medicare Other | Admitting: Physician Assistant

## 2012-04-26 ENCOUNTER — Encounter: Payer: Self-pay | Admitting: Physician Assistant

## 2012-04-26 VITALS — BP 178/62 | HR 50 | Ht 64.0 in | Wt 124.0 lb

## 2012-04-26 DIAGNOSIS — I420 Dilated cardiomyopathy: Secondary | ICD-10-CM

## 2012-04-26 DIAGNOSIS — I428 Other cardiomyopathies: Secondary | ICD-10-CM

## 2012-04-26 DIAGNOSIS — I701 Atherosclerosis of renal artery: Secondary | ICD-10-CM

## 2012-04-26 DIAGNOSIS — I10 Essential (primary) hypertension: Secondary | ICD-10-CM

## 2012-04-26 DIAGNOSIS — I4891 Unspecified atrial fibrillation: Secondary | ICD-10-CM

## 2012-04-26 MED ORDER — LISINOPRIL 5 MG PO TABS: 10.0000 mg | ORAL_TABLET | Freq: Two times a day (BID) | ORAL | Status: DC

## 2012-04-26 NOTE — Assessment & Plan Note (Signed)
Followed by Dr. Clifton James in [place]. Conservative management has been recommended.

## 2012-04-26 NOTE — Assessment & Plan Note (Signed)
Continue current medication regimen, although unable to further uptitrate carvedilol, which I substituted for labetalol at time of last OV, secondary to resting bradycardia.

## 2012-04-26 NOTE — Patient Instructions (Addendum)
   Increase Lisinopril to 10mg  twice a day   Continue all other current medications. Nurse visit in 2 weeks for BP check Lab:  BMET - can do same day as nurse visit Office will contact with results Your physician wants you to follow up in: 6 months.  You will receive a reminder letter in the mail one-two months in advance.  If you don't receive a letter, please call our office to schedule the follow up appointment

## 2012-04-26 NOTE — Progress Notes (Signed)
Primary Cardiologist: Lewayne Bunting, MD   HPI: Schedule followup visit.  Patient denies any interim chest pain or tachycardia palpitations. Unfortunately, however, while visiting her relative in [place] about 2 weeks ago, she slipped off the bathroom commode, and injured her right rib cage. She did not seek immediate medical attention, but stated that she had x-rays done here at [facility], upon her return. She states she was diagnosed with a fractured rib, treated conservatively. She denied any syncope associated with this episode.  EKG today indicates sinus bradycardia 49 bpm.  No Known Allergies  Current Outpatient Prescriptions  Medication Sig Dispense Refill  . amiodarone (PACERONE) 200 MG tablet Take 0.5 tablets (100 mg total) by mouth every evening.      . Calcium Carbonate-Vitamin D (CALTRATE 600+D PO) Take 1 tablet by mouth daily.      . carvedilol (COREG) 6.25 MG tablet Take 1 tablet (6.25 mg total) by mouth 2 (two) times daily.  60 tablet  6  . diltiazem (CARDIZEM CD) 120 MG 24 hr capsule Take 120 mg by mouth at bedtime.       Marland Kitchen diltiazem (CARDIZEM CD) 240 MG 24 hr capsule Take 240 mg by mouth every morning.      Marland Kitchen HYDROcodone-acetaminophen (VICODIN) 5-500 MG per tablet Take 1 tablet by mouth at bedtime as needed.       Marland Kitchen lisinopril (PRINIVIL,ZESTRIL) 5 MG tablet Take 1 tablet (5 mg total) by mouth 2 (two) times daily.  60 tablet  6  . lovastatin (MEVACOR) 20 MG tablet Take 20 mg by mouth at bedtime.        . metFORMIN (GLUCOPHAGE) 500 MG tablet Take 500 mg by mouth daily with breakfast.       . Multiple Vitamin (MULTIVITAMIN WITH MINERALS) TABS Take 1 tablet by mouth daily.      . prazosin (MINIPRESS) 2 MG capsule Take 2 mg by mouth 2 (two) times daily.      Marland Kitchen warfarin (COUMADIN) 5 MG tablet Take 1 tablet (5 mg total) by mouth as directed. Use as directed  45 tablet  2    Past Medical History  Diagnosis Date  . Sinoatrial node dysfunction      SINUS BRADYCARDIA  . Hypertension       unspecified  . Atrial fibrillation   . Encounter for long-term (current) use of other medications     COUMADIN THERAPY  . Lower extremity weakness   . Bilateral renal artery stenosis     MODERATE by dopplers August 2012.  . Sinus bradycardia     Past Surgical History  Procedure Date  . Knee arthroscopy   . Cardioversion     History   Social History  . Marital Status: Married    Spouse Name: N/A    Number of Children: N/A  . Years of Education: N/A   Occupational History  . RETIRED    Social History Main Topics  . Smoking status: Never Smoker   . Smokeless tobacco: Never Used  . Alcohol Use: No  . Drug Use: No  . Sexually Active: Not on file   Other Topics Concern  . Not on file   Social History Narrative  . No narrative on file    Family History  Problem Relation Age of Onset  . Cancer Other   . Heart failure Other   . Cirrhosis Other     ROS: no nausea, vomiting; no fever, chills; no melena, hematochezia; no claudication  PHYSICAL EXAM: BP 184/70  Pulse 49  Ht 5\' 4"  (1.626 m)  Wt 124 lb (56.246 kg)  BMI 21.28 kg/m2 GENERAL: 78 year old female, sitting upright; NAD  HEENT: NCAT, PERRLA, EOMI; sclera clear; no xanthelasma  NECK: palpable bilateral carotid pulses, no bruits; no JVD; no TM  LUNGS: CTA bilaterally  CARDIAC: RRR (S1, S2); soft, 2/6 systolic ejection murmur; no rubs or gallops  ABDOMEN: soft, non-tender; intact BS  EXTREMETIES: intact distal pulses; no significant peripheral edema  SKIN: warm/dry; no obvious rash/lesions  MUSCULOSKELETAL: no joint deformity  NEURO: no focal deficit; NL affect    EKG: reviewed and available in Electronic Records   ASSESSMENT & PLAN:  ATRIAL FIBRILLATION Maintaining NSR on low-dose amiodarone. Patient on chronic Coumadin anticoagulation, followed here in our clinic, and has requested INR followup today.  Dilated cardiomyopathy Continue current medication regimen, although unable to further  uptitrate carvedilol, which I substituted for labetalol at time of last OV, secondary to resting bradycardia.  HYPERTENSION, UNSPECIFIED Repeat bilateral BPs remain elevated. Increase lisinopril to 10 mg twice a day. RN visit in 2 weeks for vitals/BMET  ATHEROSCLEROSIS OF RENAL ARTERY Followed by Dr. Clifton James in [place]. Conservative management has been recommended.    [name], PAC

## 2012-04-26 NOTE — Assessment & Plan Note (Signed)
Increase lisinopril to 10 mg twice a day. RN visit in 2 weeks for vitals/BMET

## 2012-04-26 NOTE — Assessment & Plan Note (Signed)
Maintaining NSR on low-dose amiodarone. Patient on chronic Coumadin anticoagulation, followed here in our clinic, and has requested INR followup today.

## 2012-05-06 ENCOUNTER — Ambulatory Visit (INDEPENDENT_AMBULATORY_CARE_PROVIDER_SITE_OTHER): Payer: Medicare Other | Admitting: *Deleted

## 2012-05-06 ENCOUNTER — Ambulatory Visit: Payer: Medicare Other | Admitting: Cardiology

## 2012-05-06 DIAGNOSIS — I4891 Unspecified atrial fibrillation: Secondary | ICD-10-CM

## 2012-05-06 DIAGNOSIS — Z7901 Long term (current) use of anticoagulants: Secondary | ICD-10-CM

## 2012-05-10 ENCOUNTER — Ambulatory Visit (INDEPENDENT_AMBULATORY_CARE_PROVIDER_SITE_OTHER): Payer: Medicare Other | Admitting: *Deleted

## 2012-05-10 ENCOUNTER — Encounter: Payer: Self-pay | Admitting: *Deleted

## 2012-05-10 VITALS — BP 194/65 | HR 53 | Ht 64.0 in | Wt 122.0 lb

## 2012-05-10 DIAGNOSIS — I10 Essential (primary) hypertension: Secondary | ICD-10-CM

## 2012-05-10 MED ORDER — LISINOPRIL 20 MG PO TABS: 20.0000 mg | ORAL_TABLET | Freq: Two times a day (BID) | ORAL | Status: DC

## 2012-05-10 NOTE — Progress Notes (Signed)
Patient pharmacy informed.

## 2012-05-10 NOTE — Progress Notes (Signed)
Patient presents to office for nurse BP check due to recent increase in lisinopril. Patient has taken all doses of medications and no side effects noted. No c/o dizziness, chest pain or shortness of breath. Patient did bring log of home blood pressure readings to office.

## 2012-05-10 NOTE — Progress Notes (Signed)
Prescott Parma, PA       Sent:  Tue May 10, 2012 9:31 AM   To:  Eustace Moore, LPN                 Message     BPs reviewed. Recommend increasing Lisinopril further to 20 bid. Unable to increase Coreg, secondary to resting SB 50s. Will reassess HTN at next OV.

## 2012-05-11 ENCOUNTER — Encounter: Payer: Self-pay | Admitting: *Deleted

## 2012-05-20 ENCOUNTER — Ambulatory Visit (INDEPENDENT_AMBULATORY_CARE_PROVIDER_SITE_OTHER): Payer: Medicare Other | Admitting: *Deleted

## 2012-05-20 DIAGNOSIS — Z7901 Long term (current) use of anticoagulants: Secondary | ICD-10-CM

## 2012-05-20 DIAGNOSIS — I4891 Unspecified atrial fibrillation: Secondary | ICD-10-CM

## 2012-05-20 LAB — POCT INR: INR: 2.6

## 2012-05-25 ENCOUNTER — Other Ambulatory Visit: Payer: Self-pay | Admitting: Cardiology

## 2012-06-10 ENCOUNTER — Ambulatory Visit (INDEPENDENT_AMBULATORY_CARE_PROVIDER_SITE_OTHER): Payer: Medicare Other | Admitting: *Deleted

## 2012-06-10 DIAGNOSIS — Z7901 Long term (current) use of anticoagulants: Secondary | ICD-10-CM

## 2012-06-10 DIAGNOSIS — I4891 Unspecified atrial fibrillation: Secondary | ICD-10-CM

## 2012-06-10 LAB — POCT INR: INR: 3.8

## 2012-08-02 ENCOUNTER — Other Ambulatory Visit: Payer: Self-pay | Admitting: Physician Assistant

## 2012-09-15 ENCOUNTER — Other Ambulatory Visit: Payer: Self-pay | Admitting: Physician Assistant

## 2012-09-26 ENCOUNTER — Other Ambulatory Visit: Payer: Self-pay | Admitting: Cardiology

## 2012-09-27 ENCOUNTER — Other Ambulatory Visit: Payer: Self-pay | Admitting: Cardiology

## 2012-10-04 ENCOUNTER — Other Ambulatory Visit: Payer: Self-pay | Admitting: Cardiology

## 2012-10-08 ENCOUNTER — Other Ambulatory Visit: Payer: Self-pay | Admitting: Cardiology

## 2012-10-22 ENCOUNTER — Other Ambulatory Visit: Payer: Self-pay | Admitting: Physician Assistant

## 2012-10-27 ENCOUNTER — Other Ambulatory Visit: Payer: Self-pay | Admitting: Physician Assistant

## 2012-10-27 ENCOUNTER — Other Ambulatory Visit: Payer: Self-pay | Admitting: Cardiology

## 2012-11-22 ENCOUNTER — Telehealth: Payer: Self-pay | Admitting: *Deleted

## 2012-11-22 NOTE — Telephone Encounter (Signed)
Appt made for INR check on 12/09/12 and Dr Antoine Poche on 01/06/13.  Pt in agreement.

## 2012-12-09 ENCOUNTER — Ambulatory Visit (INDEPENDENT_AMBULATORY_CARE_PROVIDER_SITE_OTHER): Payer: Medicare Other | Admitting: *Deleted

## 2012-12-09 DIAGNOSIS — Z7901 Long term (current) use of anticoagulants: Secondary | ICD-10-CM

## 2012-12-09 DIAGNOSIS — I4891 Unspecified atrial fibrillation: Secondary | ICD-10-CM

## 2012-12-09 LAB — POCT INR: INR: 1.7

## 2013-01-06 ENCOUNTER — Ambulatory Visit (INDEPENDENT_AMBULATORY_CARE_PROVIDER_SITE_OTHER): Payer: Medicare Other | Admitting: *Deleted

## 2013-01-06 ENCOUNTER — Encounter: Payer: Self-pay | Admitting: Cardiology

## 2013-01-06 ENCOUNTER — Ambulatory Visit (INDEPENDENT_AMBULATORY_CARE_PROVIDER_SITE_OTHER): Payer: Medicare Other | Admitting: Cardiology

## 2013-01-06 VITALS — BP 160/64 | HR 52 | Ht 64.0 in | Wt 131.8 lb

## 2013-01-06 DIAGNOSIS — I420 Dilated cardiomyopathy: Secondary | ICD-10-CM

## 2013-01-06 DIAGNOSIS — I059 Rheumatic mitral valve disease, unspecified: Secondary | ICD-10-CM

## 2013-01-06 DIAGNOSIS — I428 Other cardiomyopathies: Secondary | ICD-10-CM

## 2013-01-06 DIAGNOSIS — R079 Chest pain, unspecified: Secondary | ICD-10-CM

## 2013-01-06 DIAGNOSIS — I340 Nonrheumatic mitral (valve) insufficiency: Secondary | ICD-10-CM

## 2013-01-06 DIAGNOSIS — I4891 Unspecified atrial fibrillation: Secondary | ICD-10-CM

## 2013-01-06 DIAGNOSIS — Z7901 Long term (current) use of anticoagulants: Secondary | ICD-10-CM

## 2013-01-06 DIAGNOSIS — R609 Edema, unspecified: Secondary | ICD-10-CM

## 2013-01-06 LAB — POCT INR: INR: 2.5

## 2013-01-06 NOTE — Progress Notes (Signed)
HPI The patient presents for followup of atrial fibrillation. She has also had some blood pressure which has been difficult to control.  She is new to me. She has not had any recent tachypalpitations, presyncope or syncope. However, she does have some tendency to "wobble". She actually fell last year as a rib fracture. Recently she had recurrent pain in that area. This is pain to palpation and to certain movements. She's not had any new shortness of breath, PND or orthopnea. She's not noticed any substernal chest pressure, neck or arm discomfort. She has had some swelling in her feet. I reviewed her previous echocardiogram which was done in June which suggested a well preserved ejection fraction with some mild right ventricular dysfunction. There was some moderate mitral regurgitation. Of note she had increased Lasix recently without improvement in her swelling.  No Known Allergies  Current Outpatient Prescriptions  Medication Sig Dispense Refill  . amiodarone (PACERONE) 200 MG tablet Take 0.5 tablets (100 mg total) by mouth every evening.      . Calcium Carbonate-Vitamin D (CALTRATE 600+D PO) Take 1 tablet by mouth daily.      . carvedilol (COREG) 6.25 MG tablet TAKE 1 TABLET BY MOUTH TWICE A DAY  60 tablet  0  . diltiazem (CARDIZEM CD) 120 MG 24 hr capsule Take 120 mg by mouth at bedtime.       Marland Kitchen diltiazem (CARDIZEM CD) 240 MG 24 hr capsule Take 240 mg by mouth every morning.      . donepezil (ARICEPT) 5 MG tablet Take 5 mg by mouth at bedtime as needed.      . furosemide (LASIX) 40 MG tablet 40 mg. Take 40 mg by mouth daily.      Marland Kitchen HYDROcodone-acetaminophen (VICODIN) 5-500 MG per tablet Take 1 tablet by mouth at bedtime as needed.       Marland Kitchen lisinopril (PRINIVIL,ZESTRIL) 20 MG tablet TAKE 1 TABLET BY MOUTH TWICE A DAY  60 tablet  3  . lovastatin (MEVACOR) 20 MG tablet Take 20 mg by mouth at bedtime.        . Multiple Vitamin (MULTIVITAMIN WITH MINERALS) TABS Take 1 tablet by mouth daily.      Marland Kitchen  omeprazole (PRILOSEC) 40 MG capsule Take 1 capsule by mouth Daily.      . Potassium 75 MG TABS Take by mouth every other day.      . prazosin (MINIPRESS) 2 MG capsule Take 2 mg by mouth 2 (two) times daily.      Marland Kitchen warfarin (COUMADIN) 5 MG tablet TAKE 1 TABLET BY MOUTH EVERY DAY AS DIRECTED  45 tablet  0   No current facility-administered medications for this visit.    Past Medical History  Diagnosis Date  . Sinoatrial node dysfunction      SINUS BRADYCARDIA  . Hypertension     unspecified  . Atrial fibrillation   . Encounter for long-term (current) use of other medications     COUMADIN THERAPY  . Lower extremity weakness   . Bilateral renal artery stenosis     MODERATE by dopplers August 2012.  . Sinus bradycardia     Past Surgical History  Procedure Laterality Date  . Knee arthroscopy    . Cardioversion      ROS:  As stated in the HPI and negative for all other systems.  PHYSICAL EXAM BP 160/64  Pulse 52  Ht 5\' 4"  (1.626 m)  Wt 131 lb 12.8 oz (59.784 kg)  BMI 22.61  kg/m2 GENERAL:  Well appearing HEENT:  Pupils equal round and reactive, fundi not visualized, oral mucosa unremarkable NECK:  No jugular venous distention, waveform within normal limits, carotid upstroke brisk and symmetric, no bruits, no thyromegaly LYMPHATICS:  No cervical, inguinal adenopathy LUNGS:  Clear to auscultation bilaterally BACK:  No CVA tenderness CHEST:  Unremarkable, tenderness to palpation right anterior lower ribs HEART:  PMI not displaced or sustained,S1 and S2 within normal limits, no S3, no S4, no clicks, no rubs, no murmurs ABD:  Flat, positive bowel sounds normal in frequency in pitch, no bruits, no rebound, no guarding, no midline pulsatile mass, no hepatomegaly, no splenomegaly EXT:  2 plus pulses throughout, nonpitting mild lower extremity edema, no cyanosis no clubbing SKIN:  No rashes no nodules NEURO:  Cranial nerves II through XII grossly intact, motor grossly intact  throughout PSYCH:  Cognitively intact, oriented to person place and time   EKG:  Sinus bradycardia, rate 48,  Left axis deviation, intervals within normal limits, no acute ST-T wave changes.  01/06/2013  ASSESSMENT AND PLAN  ATRIAL FIBRILLATION: I will check with her primary provider to make sure that she has up-to-date TSH liver profile. She'll maintain her anticoagulation and has no interest in switching to a NOAC.    CHEST PAIN:  I will order a repeat PA and lateral chest x-ray given the fact that she's having this pain so many months after the fracture.  MR:  This was moderate by echo in 2012. I will repeat an echocardiogram. This will also evaluate the lower extremity swelling.  HTN:  Her blood pressure is not well controlled. However, heart rate will not allow titration of beta blocker or calcium blocker.  He was sedated I will try to suggest 4 or 5 pound weight loss which might bring her target.  EDEMA:  This might be related to medications. Less likely I think it's related to the elevated RV pressure. She will wear compression stockings.  BRADYCARDIA:  I don't think this is contributing to current symptoms. This has been baseline.

## 2013-01-06 NOTE — Patient Instructions (Addendum)
   Chest x-ray  Echo   Labs for BMET - do in 2 weeks (around June 13)  Compression stockings  Continue all current medications. Lose 5 pounds  Office will contact with results  Your physician wants you to follow up in: 6 months.  You will receive a reminder letter in the mail one-two months in advance.  If you don't receive a letter, please call our office to schedule the follow up appointment

## 2013-01-12 ENCOUNTER — Telehealth: Payer: Self-pay | Admitting: *Deleted

## 2013-01-12 NOTE — Telephone Encounter (Signed)
Notes Recorded by Lesle Chris, LPN on 4/0/9811 at 10:35 AM Patient notified and verbalized understanding.

## 2013-01-12 NOTE — Telephone Encounter (Signed)
Message copied by Lesle Chris on Thu Jan 12, 2013 10:35 AM ------      Message from: Eustace Moore      Created: Thu Jan 12, 2013 10:15 AM                   ----- Message -----         From: Rollene Rotunda, MD         Sent: 01/10/2013   9:45 PM           To: Eustace Moore, LPN            No acute findings.  Call Ms. Legere with the results and send results to VYAS,DHRUV B., MD       ------

## 2013-01-16 NOTE — Progress Notes (Signed)
DUE June 13

## 2013-01-18 ENCOUNTER — Other Ambulatory Visit: Payer: Medicare Other

## 2013-01-19 ENCOUNTER — Other Ambulatory Visit: Payer: Self-pay

## 2013-01-19 ENCOUNTER — Ambulatory Visit (INDEPENDENT_AMBULATORY_CARE_PROVIDER_SITE_OTHER): Payer: Medicare Other | Admitting: Cardiology

## 2013-01-19 DIAGNOSIS — I059 Rheumatic mitral valve disease, unspecified: Secondary | ICD-10-CM

## 2013-01-19 DIAGNOSIS — R609 Edema, unspecified: Secondary | ICD-10-CM

## 2013-01-19 DIAGNOSIS — I340 Nonrheumatic mitral (valve) insufficiency: Secondary | ICD-10-CM

## 2013-01-25 ENCOUNTER — Encounter: Payer: Self-pay | Admitting: *Deleted

## 2013-01-30 ENCOUNTER — Telehealth: Payer: Self-pay | Admitting: *Deleted

## 2013-01-30 ENCOUNTER — Telehealth: Payer: Self-pay

## 2013-01-30 DIAGNOSIS — I5033 Acute on chronic diastolic (congestive) heart failure: Secondary | ICD-10-CM

## 2013-01-30 DIAGNOSIS — N289 Disorder of kidney and ureter, unspecified: Secondary | ICD-10-CM

## 2013-01-30 NOTE — Telephone Encounter (Signed)
Message copied by Eustace Moore on Mon Jan 30, 2013  9:46 AM ------      Message from: Rollene Rotunda      Created: Thu Jan 26, 2013  9:07 PM       Only mild MR.  No pulmonary HTN.  No further work up. Call Ms. Perret with the results and send results to VYAS,DHRUV B., MD       ------

## 2013-01-30 NOTE — Telephone Encounter (Signed)
Patient informed. Lab order faxed to [facility] lab, patient is going this Wednesday 02/01/13.

## 2013-01-30 NOTE — Telephone Encounter (Signed)
Message copied by Eustace Moore on Mon Jan 30, 2013  9:48 AM ------      Message from: Rollene Rotunda      Created: Fri Jan 20, 2013  7:49 AM       Her creat is increased from previous.  She needs to have a BMET repeated in one week.  Call Ms. Loveless with the results ------

## 2013-01-30 NOTE — Telephone Encounter (Signed)
Patient calling about lab work results.  Updated patient phone number.  She is responding to letter received.

## 2013-01-30 NOTE — Telephone Encounter (Signed)
Patient informed and copy sent to PCP. 

## 2013-02-01 ENCOUNTER — Other Ambulatory Visit: Payer: Self-pay

## 2013-02-01 ENCOUNTER — Other Ambulatory Visit (INDEPENDENT_AMBULATORY_CARE_PROVIDER_SITE_OTHER): Payer: Medicare Other

## 2013-02-01 DIAGNOSIS — R0989 Other specified symptoms and signs involving the circulatory and respiratory systems: Secondary | ICD-10-CM

## 2013-02-01 DIAGNOSIS — R609 Edema, unspecified: Secondary | ICD-10-CM

## 2013-02-01 DIAGNOSIS — I340 Nonrheumatic mitral (valve) insufficiency: Secondary | ICD-10-CM

## 2013-02-03 ENCOUNTER — Ambulatory Visit (INDEPENDENT_AMBULATORY_CARE_PROVIDER_SITE_OTHER): Payer: Medicare Other | Admitting: Physician Assistant

## 2013-02-03 ENCOUNTER — Ambulatory Visit (INDEPENDENT_AMBULATORY_CARE_PROVIDER_SITE_OTHER): Payer: Medicare Other | Admitting: *Deleted

## 2013-02-03 VITALS — BP 168/69 | HR 50 | Ht 64.0 in | Wt 128.0 lb

## 2013-02-03 DIAGNOSIS — I495 Sick sinus syndrome: Secondary | ICD-10-CM

## 2013-02-03 DIAGNOSIS — I4891 Unspecified atrial fibrillation: Secondary | ICD-10-CM

## 2013-02-03 DIAGNOSIS — R600 Localized edema: Secondary | ICD-10-CM | POA: Insufficient documentation

## 2013-02-03 DIAGNOSIS — R0602 Shortness of breath: Secondary | ICD-10-CM

## 2013-02-03 DIAGNOSIS — I10 Essential (primary) hypertension: Secondary | ICD-10-CM

## 2013-02-03 DIAGNOSIS — Z7901 Long term (current) use of anticoagulants: Secondary | ICD-10-CM

## 2013-02-03 DIAGNOSIS — R609 Edema, unspecified: Secondary | ICD-10-CM

## 2013-02-03 MED ORDER — TORSEMIDE 20 MG PO TABS: 20.0000 mg | ORAL_TABLET | Freq: Every day | ORAL | Status: DC

## 2013-02-03 MED ORDER — DILTIAZEM HCL ER COATED BEADS 120 MG PO CP24: 120.0000 mg | ORAL_CAPSULE | Freq: Two times a day (BID) | ORAL | Status: DC

## 2013-02-03 NOTE — Assessment & Plan Note (Signed)
Maintaining SB on low-dose amiodarone. On Coumadin anticoagulation, followed by primary M.D.

## 2013-02-03 NOTE — Progress Notes (Signed)
Primary Cardiologist: Rollene Rotunda, MD   HPI: Patient seen as an add-on for evaluation of peripheral edema.  Although she did start wearing compression hose as advised, she does not do so every day. She tries to maintain her legs elevated, whenever possible. She suggests some recent worsening of her peripheral edema, although she states today that it is much better, particularly on the left. Of note, she denies symptoms suggestive of congestive heart failure. She denies tachycardia palpitations. She does report occasional weakness and lightheadedness, but denies syncope or any recent falls.  A recent followup echocardiogram, per Dr. Antoine Poche, indicated NL LVF (EF 60-65%), with mild MR, and no definite mitral stenosis; NL RVF. He also ordered a CXR, which showed no acute changes.  Recent laboratory data, per Dr. Sherril Croon, notable for BUN 36, creatinine 2.0, potassium 4.2, and NL TSH.  No Known Allergies  Current Outpatient Prescriptions  Medication Sig Dispense Refill  . amiodarone (PACERONE) 200 MG tablet Take 0.5 tablets (100 mg total) by mouth every evening.      . Biotin 1000 MCG tablet Take 1,000 mcg by mouth daily. Take by mouth.      . Calcium Carbonate-Vitamin D (CALTRATE 600+D PO) Take 1 tablet by mouth daily.      . carvedilol (COREG) 6.25 MG tablet TAKE 1 TABLET BY MOUTH TWICE A DAY  60 tablet  0  . diltiazem (CARDIZEM CD) 120 MG 24 hr capsule Take 1 capsule (120 mg total) by mouth 2 (two) times daily.  60 capsule  3  . donepezil (ARICEPT) 5 MG tablet Take 5 mg by mouth at bedtime as needed.      . Ergocalciferol 400 UNITS TABS Take 400 Units by mouth daily. Take by mouth daily.      Marland Kitchen HYDROcodone-acetaminophen (VICODIN) 5-500 MG per tablet Take 1 tablet by mouth at bedtime as needed.       Marland Kitchen lisinopril (PRINIVIL,ZESTRIL) 20 MG tablet TAKE 1 TABLET BY MOUTH TWICE A DAY  60 tablet  3  . lovastatin (MEVACOR) 20 MG tablet Take 20 mg by mouth at bedtime.        . Multiple Vitamin  (MULTIVITAMIN WITH MINERALS) TABS Take 1 tablet by mouth daily.      Marland Kitchen omeprazole (PRILOSEC) 40 MG capsule Take 1 capsule by mouth Daily.      . Potassium 75 MG TABS Take by mouth every other day.      . prazosin (MINIPRESS) 2 MG capsule Take 2 mg by mouth 2 (two) times daily.      Marland Kitchen warfarin (COUMADIN) 5 MG tablet TAKE 1 TABLET BY MOUTH EVERY DAY AS DIRECTED  45 tablet  0  . torsemide (DEMADEX) 20 MG tablet Take 1 tablet (20 mg total) by mouth daily.  30 tablet  3   No current facility-administered medications for this visit.    Past Medical History  Diagnosis Date  . Sinoatrial node dysfunction      SINUS BRADYCARDIA  . Hypertension     unspecified  . Atrial fibrillation   . Encounter for long-term (current) use of other medications     COUMADIN THERAPY  . Lower extremity weakness   . Bilateral renal artery stenosis     MODERATE by dopplers August 2012.  . Sinus bradycardia     Past Surgical History  Procedure Laterality Date  . Knee arthroscopy    . Cardioversion      History   Social History  . Marital Status: Married  Spouse Name: N/A    Number of Children: N/A  . Years of Education: N/A   Occupational History  . RETIRED    Social History Main Topics  . Smoking status: Never Smoker   . Smokeless tobacco: Never Used  . Alcohol Use: No  . Drug Use: No  . Sexually Active: Not on file   Other Topics Concern  . Not on file   Social History Narrative  . No narrative on file    Family History  Problem Relation Age of Onset  . Cancer Other   . Heart failure Other   . Cirrhosis Other     ROS: no nausea, vomiting; no fever, chills; no melena, hematochezia; no claudication  PHYSICAL EXAM: BP 168/69  Pulse 50  Ht 5\' 4"  (1.626 m)  Wt 128 lb (58.06 kg)  BMI 21.96 kg/m2  SpO2 96% GENERAL: 79 year-old female; NAD HEENT: NCAT, PERRLA, EOMI; sclera clear; no xanthelasma NECK: JVD noted on the right, 90; no TM LUNGS: CTA bilaterally CARDIAC: RRR (S1,  S2); no significant murmurs; no rubs or gallops ABDOMEN: soft, non-tender; intact BS EXTREMETIES: 1-2 + LLE peripheral edema, trace-1+ on the right SKIN: warm/dry; no obvious rash/lesions MUSCULOSKELETAL: no joint deformity NEURO: no focal deficit; NL affect   EKG:    ASSESSMENT & PLAN:  Peripheral edema In addition to reemphasizing the importance of wearing compression hose daily, maintaining her legs elevated whenever possible, and refraining from added salt in her diet, I will discontinue Lasix and substitute with Demadex 20 mg daily, for more brisk diuretic response. Will check labs today, including BNP level, and repeat BMET in one week. Will reassess clinical status in 2 weeks. Of note, recent echocardiogram indicated NL RVF.  SINUS BRADYCARDIA Patient presents with persistent sinus bradycardia in the low 50 bpm range, now on 2 successive occasions. She is also on low-dose amiodarone and carvedilol. Will decrease Cardizem to 120 twice a day, which should also help reduce the risk of associated peripheral edema. However, we will need to monitor for worsening of BP, which apparently has been difficult to control in the past. I even considered adding HCTZ for better BP control; however, patient has history of renal insufficiency, which may be exacerbated by combining this with her loop diuretic.  HYPERTENSION, UNSPECIFIED We'll reassess at time of next OV, following today decrease in Cardizem dose.  ATRIAL FIBRILLATION Maintaining SB on low-dose amiodarone. On Coumadin anticoagulation, followed by primary M.D.    Gene Tad Fancher, PAC

## 2013-02-03 NOTE — Patient Instructions (Addendum)
Your physician recommends that you schedule a follow-up appointment in: 2 weeks. Your physician has recommended you make the following change in your medication: Stop furosemide and Start torsemide (demadex) 20 mg daily. Decreased your diltiazem to 120 mg twice daily. Your new prescriptions have been sent to your pharmacy. All other medications will remain the same. Your physician recommends that you have lab work TODAY at [facility] for BMET/BNP. Your physician recommends that you return for lab work in 1 week also for follow up BMET at [facility].

## 2013-02-03 NOTE — Assessment & Plan Note (Signed)
In addition to reemphasizing the importance of wearing compression hose daily, maintaining her legs elevated whenever possible, and refraining from added salt in her diet, I will discontinue Lasix and substitute with Demadex 20 mg daily, for more brisk diuretic response. Will check labs today, including BNP level, and repeat BMET in one week. Will reassess clinical status in 2 weeks. Of note, recent echocardiogram indicated NL RVF.

## 2013-02-03 NOTE — Assessment & Plan Note (Signed)
We'll reassess at time of next OV, following today decrease in Cardizem dose.

## 2013-02-03 NOTE — Assessment & Plan Note (Signed)
Patient presents with persistent sinus bradycardia in the low 50 bpm range, now on 2 successive occasions. She is also on low-dose amiodarone and carvedilol. Will decrease Cardizem to 120 twice a day, which should also help reduce the risk of associated peripheral edema. However, we will need to monitor for worsening of BP, which apparently has been difficult to control in the past. I even considered adding HCTZ for better BP control; however, patient has history of renal insufficiency, which may be exacerbated by combining this with her loop diuretic.

## 2013-02-08 ENCOUNTER — Telehealth: Payer: Self-pay | Admitting: *Deleted

## 2013-02-08 NOTE — Telephone Encounter (Signed)
Message copied by Eustace Moore on Wed Feb 08, 2013 11:40 AM ------      Message from: Rollene Rotunda      Created: Sun Feb 05, 2013  2:37 PM       No new findings on this echo.  No change in therapy. Call Ms. Tarpley with the results and send results to VYAS,DHRUV B., MD       ------

## 2013-02-08 NOTE — Telephone Encounter (Signed)
Message copied by Eustace Moore on Wed Feb 08, 2013 11:39 AM ------      Message from: Rande Brunt      Created: Mon Feb 06, 2013  3:20 PM       Repeat BMET/BNP in 1 week ------

## 2013-02-08 NOTE — Telephone Encounter (Signed)
Patient informed and said she will get orders during office visit on the 11th July.

## 2013-02-08 NOTE — Telephone Encounter (Signed)
Patient informed. 

## 2013-02-17 ENCOUNTER — Ambulatory Visit (INDEPENDENT_AMBULATORY_CARE_PROVIDER_SITE_OTHER): Payer: Medicare Other | Admitting: Physician Assistant

## 2013-02-17 ENCOUNTER — Encounter: Payer: Self-pay | Admitting: Physician Assistant

## 2013-02-17 ENCOUNTER — Ambulatory Visit: Payer: Medicare Other | Admitting: Physician Assistant

## 2013-02-17 VITALS — BP 185/69 | HR 50 | Ht 64.0 in | Wt 129.0 lb

## 2013-02-17 DIAGNOSIS — I4891 Unspecified atrial fibrillation: Secondary | ICD-10-CM

## 2013-02-17 DIAGNOSIS — R0602 Shortness of breath: Secondary | ICD-10-CM

## 2013-02-17 DIAGNOSIS — N189 Chronic kidney disease, unspecified: Secondary | ICD-10-CM | POA: Insufficient documentation

## 2013-02-17 DIAGNOSIS — Z79899 Other long term (current) drug therapy: Secondary | ICD-10-CM

## 2013-02-17 DIAGNOSIS — R609 Edema, unspecified: Secondary | ICD-10-CM

## 2013-02-17 DIAGNOSIS — I5032 Chronic diastolic (congestive) heart failure: Secondary | ICD-10-CM

## 2013-02-17 DIAGNOSIS — I495 Sick sinus syndrome: Secondary | ICD-10-CM

## 2013-02-17 DIAGNOSIS — I10 Essential (primary) hypertension: Secondary | ICD-10-CM

## 2013-02-17 NOTE — Patient Instructions (Signed)
   Labs for BMET, BNP  Office will contact with results via phone or letter.   Keep blood pressure log x 2 weeks - three x per day.  Check first thing in the morning before morning meds and approx 12 hours later.   Continue all current medications.  Follow up as scheduled

## 2013-02-17 NOTE — Assessment & Plan Note (Signed)
Appears to be labile, based on her history. She is on multimodal therapy. She also has previously documented moderate bilateral renal artery stenosis, followed by Dr. Clifton James. She is due for a followup renal artery duplex scan in September. Will defer on any medication adjustments at present time, pending results of her followup ultrasound. Her RAS may have progressed, thus accounting for her elevated BP. In the meanwhile, I've asked her to keep a BP log over the next 2 weeks, with particular emphasis on taking a.m. readings prior to taking her medications. We will then review the data and may consider adjusting her antihypertensive regimen, at that time.

## 2013-02-17 NOTE — Assessment & Plan Note (Signed)
Maintaining SB on low-dose amiodarone. On Coumadin anticoagulation, followed by primary M.D. 

## 2013-02-17 NOTE — Assessment & Plan Note (Signed)
Unchanged since last OV, despite reducing Cardizem dose by one half. She remains on low-dose amiodarone and carvedilol, as well. However, I am hesitant to cut back on either carvedilol or diltiazem, due to probable worsening of her labile HTN. Will continue to monitor closely.

## 2013-02-17 NOTE — Assessment & Plan Note (Addendum)
Significantly improved following substitution of Lasix with Demadex. NL LVF and RVF, by recent echocardiogram. Continue current dose Demadex and check followup labs.

## 2013-02-17 NOTE — Progress Notes (Signed)
Primary Cardiologist: Rollene Rotunda, MD   HPI: Scheduled 2 week followup.  At last visit, I recommended substituting Lasix with Demadex 20 mg daily, for more aggressive diuretic treatment of refractory peripheral edema.    - Laboratory data, July 7: BUN 29, creatinine 1.7 (2.1), potassium 3.9. Initial BNP 330, on June 27  She returns today reporting marked improvement in her peripheral edema. Despite a 1 pound gain since last OV, she continues to deny symptoms suggestive of CHF.  No Known Allergies  Current Outpatient Prescriptions  Medication Sig Dispense Refill  . amiodarone (PACERONE) 200 MG tablet Take 0.5 tablets (100 mg total) by mouth every evening.      . Biotin 1000 MCG tablet Take 1,000 mcg by mouth daily. Take by mouth.      . Calcium Carbonate-Vitamin D (CALTRATE 600+D PO) Take 1 tablet by mouth daily.      . carvedilol (COREG) 6.25 MG tablet TAKE 1 TABLET BY MOUTH TWICE A DAY  60 tablet  0  . diltiazem (CARDIZEM CD) 120 MG 24 hr capsule Take 1 capsule (120 mg total) by mouth 2 (two) times daily.  60 capsule  3  . donepezil (ARICEPT) 5 MG tablet Take 5 mg by mouth at bedtime as needed.      . Ergocalciferol 400 UNITS TABS Take 400 Units by mouth daily. Take by mouth daily.      Marland Kitchen HYDROcodone-acetaminophen (VICODIN) 5-500 MG per tablet Take 1 tablet by mouth at bedtime as needed.       Marland Kitchen lisinopril (PRINIVIL,ZESTRIL) 20 MG tablet TAKE 1 TABLET BY MOUTH TWICE A DAY  60 tablet  3  . lovastatin (MEVACOR) 20 MG tablet Take 20 mg by mouth at bedtime.        . Multiple Vitamin (MULTIVITAMIN WITH MINERALS) TABS Take 1 tablet by mouth daily.      Marland Kitchen omeprazole (PRILOSEC) 40 MG capsule Take 1 capsule by mouth Daily.      . Potassium 75 MG TABS Take by mouth every other day.      . prazosin (MINIPRESS) 2 MG capsule Take 2 mg by mouth 2 (two) times daily.      Marland Kitchen torsemide (DEMADEX) 20 MG tablet Take 1 tablet (20 mg total) by mouth daily.  30 tablet  3  . warfarin (COUMADIN) 5 MG tablet  TAKE 1 TABLET BY MOUTH EVERY DAY AS DIRECTED  45 tablet  0   No current facility-administered medications for this visit.    Past Medical History  Diagnosis Date  . Sinoatrial node dysfunction      SINUS BRADYCARDIA  . Hypertension     unspecified  . Atrial fibrillation   . Encounter for long-term (current) use of other medications     COUMADIN THERAPY  . Lower extremity weakness   . Bilateral renal artery stenosis     MODERATE by dopplers August 2012.  . Sinus bradycardia   . Chronic renal insufficiency     Past Surgical History  Procedure Laterality Date  . Knee arthroscopy    . Cardioversion      History   Social History  . Marital Status: Married    Spouse Name: N/A    Number of Children: N/A  . Years of Education: N/A   Occupational History  . RETIRED    Social History Main Topics  . Smoking status: Never Smoker   . Smokeless tobacco: Never Used  . Alcohol Use: No  . Drug Use: No  . Sexually  Active: Not on file   Other Topics Concern  . Not on file   Social History Narrative  . No narrative on file    Family History  Problem Relation Age of Onset  . Cancer Other   . Heart failure Other   . Cirrhosis Other     ROS: no nausea, vomiting; no fever, chills; no melena, hematochezia; no claudication  PHYSICAL EXAM: BP 185/69  Pulse 50  Ht 5\' 4"  (1.626 m)  Wt 129 lb (58.514 kg)  BMI 22.13 kg/m2  SpO2 95% GENERAL: 79 year-old female; NAD  HEENT: NCAT, PERRLA, EOMI; sclera clear; no xanthelasma  NECK: JVD noted on the right, at 90 LUNGS: CTA bilaterally  CARDIAC: RRR (S1, S2); no significant murmurs; no rubs or gallops  ABDOMEN: soft, non-tender; intact BS  EXTREMETIES: Trace bilateral peripheral edema SKIN: warm/dry; no obvious rash/lesions  MUSCULOSKELETAL: no joint deformity  NEURO: no focal deficit; NL affect    EKG:    ASSESSMENT & PLAN:  Peripheral edema Significantly improved following substitution of Lasix with Demadex. NL LVF  and RVF, by recent echocardiogram. Continue current dose Demadex and check followup labs.  Chronic renal insufficiency Followed by nephrology. Recent followup labs indicated improved renal function, following substitution with Demadex. Will check followup labs today.  HYPERTENSION, UNSPECIFIED Appears to be labile, based on her history. She is on multimodal therapy. She also has previously documented moderate bilateral renal artery stenosis, followed by Dr. Clifton James. She is due for a followup renal artery duplex scan in September. Will defer on any medication adjustments at present time, pending results of her followup ultrasound. Her RAS may have progressed, thus accounting for her elevated BP. In the meanwhile, I've asked her to keep a BP log over the next 2 weeks, with particular emphasis on taking a.m. readings prior to taking her medications. We will then review the data and may consider adjusting her antihypertensive regimen, at that time.  Chronic diastolic heart failure Euvolemic by history and exam. Will check followup labs, including repeat BNP level. Continue current dose Demadex for now, pending review of laboratory data.  SINUS BRADYCARDIA Unchanged since last OV, despite reducing Cardizem dose by one half. She remains on low-dose amiodarone and carvedilol, as well. However, I am hesitant to cut back on either carvedilol or diltiazem, due to probable worsening of her labile HTN. Will continue to monitor closely.  Atrial fibrillation  Maintaining SB on low-dose amiodarone. On Coumadin anticoagulation, followed by primary M.D.    Gene Baruch Lewers, PAC

## 2013-02-17 NOTE — Assessment & Plan Note (Signed)
Followed by nephrology. Recent followup labs indicated improved renal function, following substitution with Demadex. Will check followup labs today.

## 2013-02-17 NOTE — Assessment & Plan Note (Signed)
Euvolemic by history and exam. Will check followup labs, including repeat BNP level. Continue current dose Demadex for now, pending review of laboratory data.

## 2013-02-21 ENCOUNTER — Telehealth: Payer: Self-pay | Admitting: Cardiology

## 2013-02-21 NOTE — Telephone Encounter (Signed)
Dr. Sherril Croon office sent in a refill for once daily instead of twice daily and she wants to know if its ok. Her blood pressure has been running high. This morning it was 175/75, yesterday it was 209/70 in AM and in PM it was 204/63. Patient has only taken one tablet on Monday and today. Patient has no other complaints. Patient also wants to know if she needs to continue taking her potassium.

## 2013-02-22 NOTE — Telephone Encounter (Signed)
Once daily of what, Britta Mccreedy?

## 2013-02-23 NOTE — Telephone Encounter (Signed)
Lisinopril 20mg.

## 2013-02-23 NOTE — Telephone Encounter (Signed)
Called pt and informed her to call Dr. Sherril Croon office in regards to her Lisinopril being reduced to once daily.

## 2013-02-23 NOTE — Telephone Encounter (Signed)
My recent OV note shows that she was on Lisinopril 20 bid. Therefore, I recommend she contact Dr Sherril Croon and ask him why her dose was reduced.

## 2013-03-03 ENCOUNTER — Telehealth: Payer: Self-pay | Admitting: *Deleted

## 2013-03-03 ENCOUNTER — Ambulatory Visit (INDEPENDENT_AMBULATORY_CARE_PROVIDER_SITE_OTHER): Payer: Medicare Other | Admitting: *Deleted

## 2013-03-03 DIAGNOSIS — I4891 Unspecified atrial fibrillation: Secondary | ICD-10-CM

## 2013-03-03 DIAGNOSIS — Z7901 Long term (current) use of anticoagulants: Secondary | ICD-10-CM

## 2013-03-03 MED ORDER — HYDROCHLOROTHIAZIDE 12.5 MG PO CAPS: 12.5000 mg | ORAL_CAPSULE | Freq: Two times a day (BID) | ORAL | Status: DC

## 2013-03-03 MED ORDER — TORSEMIDE 20 MG PO TABS: 20.0000 mg | ORAL_TABLET | Freq: Two times a day (BID) | ORAL | Status: DC

## 2013-03-03 NOTE — Addendum Note (Signed)
Addended by: Eustace Moore on: 03/03/2013 03:28 PM   Modules accepted: Orders

## 2013-03-03 NOTE — Telephone Encounter (Signed)
Spoke with patient and informed her that her BP results were reviewed by Gene and he has requested that she start HCTZ 12.5 mg BID. Patient verbalized understanding of plan. Please see BP results for details.

## 2013-03-10 ENCOUNTER — Telehealth: Payer: Self-pay | Admitting: *Deleted

## 2013-03-10 ENCOUNTER — Ambulatory Visit (INDEPENDENT_AMBULATORY_CARE_PROVIDER_SITE_OTHER): Payer: Medicare Other | Admitting: *Deleted

## 2013-03-10 DIAGNOSIS — Z7901 Long term (current) use of anticoagulants: Secondary | ICD-10-CM

## 2013-03-10 DIAGNOSIS — Z79899 Other long term (current) drug therapy: Secondary | ICD-10-CM

## 2013-03-10 DIAGNOSIS — R7989 Other specified abnormal findings of blood chemistry: Secondary | ICD-10-CM

## 2013-03-10 DIAGNOSIS — I4891 Unspecified atrial fibrillation: Secondary | ICD-10-CM

## 2013-03-10 LAB — POCT INR: INR: 3.6

## 2013-03-10 NOTE — Telephone Encounter (Signed)
Message copied by Lesle Chris on Fri Mar 10, 2013  4:35 PM ------      Message from: Rande Brunt      Created: Fri Mar 10, 2013 11:43 AM       Creatinine up to 3.3 (2.0). Notify pt and instruct to D/C the following meds: Demadex, HCTZ, Lisinopril, and K. Liberalize fluid intake. BMET on Monday, 03/13/2013, with results called to [place] before pt leaves lab. ------

## 2013-03-10 NOTE — Telephone Encounter (Signed)
Notes Recorded by Lesle Chris, LPN on 08/15/1094 at 4:35 PM Patient notified. Will fax lab order to [facility] now.

## 2013-03-15 ENCOUNTER — Telehealth: Payer: Self-pay | Admitting: *Deleted

## 2013-03-15 DIAGNOSIS — R7989 Other specified abnormal findings of blood chemistry: Secondary | ICD-10-CM

## 2013-03-15 DIAGNOSIS — Z79899 Other long term (current) drug therapy: Secondary | ICD-10-CM

## 2013-03-15 NOTE — Telephone Encounter (Signed)
Notes Recorded by Lesle Chris, LPN on 0/04/8118 at 4:43 PM Patient notified and verbalized understanding. Lab order faxed to [facility] now.

## 2013-03-15 NOTE — Telephone Encounter (Signed)
Message copied by Lesle Chris on Wed Mar 15, 2013  4:43 PM ------      Message from: Prescott Parma C      Created: Wed Mar 15, 2013  3:17 PM       Creatinine down to 2.3 (3.3). Continue current medication regimen and repeat BMET in am (8/7).             ------

## 2013-03-21 ENCOUNTER — Telehealth: Payer: Self-pay | Admitting: *Deleted

## 2013-03-21 MED ORDER — LISINOPRIL 10 MG PO TABS: 10.0000 mg | ORAL_TABLET | Freq: Every day | ORAL | Status: DC

## 2013-03-21 NOTE — Telephone Encounter (Signed)
Notes Recorded by Lesle Chris, LPN on 1/61/0960 at 11:00 AM Patient notified. Already has OV scheduled for 8/20 with Gene Serpe, PA.

## 2013-03-21 NOTE — Telephone Encounter (Signed)
Message copied by Lesle Chris on Tue Mar 21, 2013 11:00 AM ------      Message from: Rande Brunt      Created: Fri Mar 17, 2013 11:55 AM       Creatinine continues to improve. Resume Lisinopril Rx, but at lower dose of 10 daily. Continue holding Demadex, HCTZ, and KDUR. Will need f/u BMET at next OV ------

## 2013-03-24 ENCOUNTER — Ambulatory Visit (INDEPENDENT_AMBULATORY_CARE_PROVIDER_SITE_OTHER): Payer: Medicare Other | Admitting: *Deleted

## 2013-03-24 DIAGNOSIS — Z7901 Long term (current) use of anticoagulants: Secondary | ICD-10-CM

## 2013-03-24 DIAGNOSIS — I4891 Unspecified atrial fibrillation: Secondary | ICD-10-CM

## 2013-03-28 ENCOUNTER — Telehealth: Payer: Self-pay | Admitting: *Deleted

## 2013-03-28 NOTE — Telephone Encounter (Signed)
Gene, please see below & advise.  Sent to Dr. Kirtland Bouchard because you were out of office.

## 2013-03-28 NOTE — Telephone Encounter (Signed)
Started Lisinopril back on 03/21/13 at 10mg  daily.  After being back on med, she increased herself to twice a day due to her BP staying elevated.  Informed pt that she should not make changes on her medications without MD approval because of her recent elevated kidney function.  8/16 - 197/63 - before am meds.  Recheck after was 179/60  8/17 - 196/66 - before am meds.  Recheck after was 153/63 (9:20 am) & 202/65 (5:00 pm)  8/18 - 183/66 - before am meds.  Did not recheck again till 6:30 pm - 167/70  8/19 - 186/69 - before am meds.    Has OV with Gene Serpe, PA on Monday, 04/03/2013.

## 2013-03-28 NOTE — Telephone Encounter (Signed)
She can f/u with Gene and he can adjust appropriately. Suspect she will need a minimum of 20 mg daily based on those BP readings, and will need a BMP recheck as well.

## 2013-03-30 NOTE — Telephone Encounter (Signed)
Continue current Rx with ACE at 10 bid. Will reassess at f/u OV on 8/25. Of note, pt has h/o RAS and has RI; therefore, I am reluctant to push ACE Rx at present, pending results of scheduled f/u Renal dopplers with Dr Clifton James.

## 2013-03-31 NOTE — Telephone Encounter (Signed)
Patient notified

## 2013-04-03 ENCOUNTER — Encounter: Payer: Self-pay | Admitting: Physician Assistant

## 2013-04-03 ENCOUNTER — Ambulatory Visit (INDEPENDENT_AMBULATORY_CARE_PROVIDER_SITE_OTHER): Payer: Medicare Other | Admitting: Physician Assistant

## 2013-04-03 VITALS — BP 163/49 | HR 45 | Ht 64.0 in | Wt 132.8 lb

## 2013-04-03 DIAGNOSIS — I4891 Unspecified atrial fibrillation: Secondary | ICD-10-CM

## 2013-04-03 DIAGNOSIS — I495 Sick sinus syndrome: Secondary | ICD-10-CM

## 2013-04-03 DIAGNOSIS — I5032 Chronic diastolic (congestive) heart failure: Secondary | ICD-10-CM

## 2013-04-03 DIAGNOSIS — R609 Edema, unspecified: Secondary | ICD-10-CM

## 2013-04-03 DIAGNOSIS — N189 Chronic kidney disease, unspecified: Secondary | ICD-10-CM

## 2013-04-03 DIAGNOSIS — I10 Essential (primary) hypertension: Secondary | ICD-10-CM

## 2013-04-03 MED ORDER — DILTIAZEM HCL ER COATED BEADS 120 MG PO CP24: ORAL_CAPSULE | ORAL | Status: DC

## 2013-04-03 MED ORDER — AMLODIPINE BESYLATE 10 MG PO TABS: 10.0000 mg | ORAL_TABLET | Freq: Every day | ORAL | Status: DC

## 2013-04-03 NOTE — Assessment & Plan Note (Signed)
Will check followup labs. Most recent assessment indicated improvement in her creatinine level, down to 1.9 (peak 3.3)

## 2013-04-03 NOTE — Assessment & Plan Note (Signed)
Based on recent evidence of exacerbation of chronic kidney disease, with known bilateral RAS, I have elected to not resume diuretic treatment for her chronic peripheral edema. Moreover, she has documented evidence of NL LVF and RVF, by recent echocardiography. Therefore, recommend conservative management employing use of compression hose (which she has used in the past), and elevating her legs whenever possible.

## 2013-04-03 NOTE — Assessment & Plan Note (Signed)
I believe that this remains a contributing factor in her complaint of dizziness, fatigue, and gait instability. She has now had documented marked sinus bradycardia on several occasions. She remains on low-dose amiodarone and carvedilol, but in addition to Cardizem, which I decreased in the past for this problem. Therefore, I recommend cutting her Cardizem dose in half for 1 week, then stopping it altogether. She is to continue on low-dose amiodarone, for maintenance of NSR, as well as carvedilol. Will reassess her vitals later this week, at which time we should see an improvement in her resting heart rate.

## 2013-04-03 NOTE — Patient Instructions (Addendum)
   Increase leg elevation while sitting, wear compression hose  Lab today for BMET Office will contact with results via phone or letter.   Decrease Cardizem CD 120mg  to DAILY x 1 week, then STOP. Begin Norvasc 10mg  daily - new sent to pharm  Nurse visit 04/06/2013, Thursday for blood pressure & heart rate check Continue all other medications.   Follow up in  1 month

## 2013-04-03 NOTE — Assessment & Plan Note (Signed)
Maintaining SB on low-dose amiodarone. On Coumadin anticoagulation, followed here in our clinic.

## 2013-04-03 NOTE — Assessment & Plan Note (Signed)
Compensated by history and exam. Do not recommend resuming diuretic therapy, for reasons as outlined above.

## 2013-04-03 NOTE — Progress Notes (Addendum)
Primary Cardiologist: Rollene Rotunda, MD   HPI: Patient returns for early followup for further evaluation of dizziness with associated fatigue.   Since her last OV, I had to stop all diuretics secondary to worsening renal function (peak creatinine 3.3). I had added HCTZ 12.5 bid for added treatment of refractory HTN. She was instructed to liberalize her fluid intake, and followup blood work revealed improved renal function, with most recent results as follows:   - Laboratory data, August 7: BUN 52, creatinine 1.9 (peak 3.3, 7/31), potassium 4.0  She also returns today to demonstrate her BP log, as she had been instructed. This continues to demonstrate uncontrolled HTN, with systolic readings as low as 140 and as high as 209.  Clinically, she denies CP, SOB, or tachypalpitations. However, she continues to report gait instability, but denies frank syncope or falls.   12-lead EKG today, reviewed by me, indicates marked SB 45 bpm; no definite ischemic changes  No Known Allergies  Current Outpatient Prescriptions  Medication Sig Dispense Refill  . amiodarone (PACERONE) 200 MG tablet Take 0.5 tablets (100 mg total) by mouth every evening.      . Biotin 1000 MCG tablet Take 5,000 mcg by mouth daily. Take by mouth.      . Calcium Carbonate-Vitamin D (CALTRATE 600+D PO) Take 1 tablet by mouth daily.      . carvedilol (COREG) 6.25 MG tablet TAKE 1 TABLET BY MOUTH TWICE A DAY  60 tablet  0  . diltiazem (CARDIZEM CD) 120 MG 24 hr capsule Decrease to daily x 1 week, then stop.      Marland Kitchen donepezil (ARICEPT) 5 MG tablet Take 5 mg by mouth at bedtime as needed.      Marland Kitchen HYDROcodone-acetaminophen (VICODIN) 5-500 MG per tablet Take 1 tablet by mouth at bedtime as needed.       Marland Kitchen lisinopril (PRINIVIL,ZESTRIL) 10 MG tablet Take 10 mg by mouth 2 (two) times daily.      Marland Kitchen lovastatin (MEVACOR) 20 MG tablet Take 20 mg by mouth at bedtime.        Marland Kitchen omeprazole (PRILOSEC) 40 MG capsule Take 1 capsule by mouth Daily.       . prazosin (MINIPRESS) 2 MG capsule Take 2 mg by mouth 2 (two) times daily.      Marland Kitchen warfarin (COUMADIN) 5 MG tablet TAKE 1 TABLET BY MOUTH EVERY DAY AS DIRECTED  45 tablet  0  . amLODipine (NORVASC) 10 MG tablet Take 1 tablet (10 mg total) by mouth daily.  30 tablet  6   No current facility-administered medications for this visit.    Past Medical History  Diagnosis Date  . Sinoatrial node dysfunction      SINUS BRADYCARDIA  . Hypertension     unspecified  . Atrial fibrillation   . Encounter for long-term (current) use of other medications     COUMADIN THERAPY  . Lower extremity weakness   . Bilateral renal artery stenosis     MODERATE by dopplers August 2012.  . Sinus bradycardia   . Chronic renal insufficiency     Past Surgical History  Procedure Laterality Date  . Knee arthroscopy    . Cardioversion      History   Social History  . Marital Status: Married    Spouse Name: N/A    Number of Children: N/A  . Years of Education: N/A   Occupational History  . RETIRED    Social History Main Topics  . Smoking status: Never  Smoker   . Smokeless tobacco: Never Used  . Alcohol Use: No  . Drug Use: No  . Sexual Activity: Not on file   Other Topics Concern  . Not on file   Social History Narrative  . No narrative on file    Family History  Problem Relation Age of Onset  . Cancer Other   . Heart failure Other   . Cirrhosis Other     ROS: no nausea, vomiting; no fever, chills; no melena, hematochezia; no claudication  PHYSICAL EXAM: BP 163/49  Pulse 45  Ht 5\' 4"  (1.626 m)  Wt 132 lb 12.8 oz (60.238 kg)  BMI 22.78 kg/m2 GENERAL: 79 year-old female; NAD  HEENT: NCAT, PERRLA, EOMI; sclera clear; no xanthelasma  NECK: JVD noted on the right, at 90  LUNGS: CTA bilaterally  CARDIAC:  Regular rhythm, decreased rate (S1, S2); no significant murmurs; no rubs or gallops  ABDOMEN: soft, non-tender; intact BS  EXTREMETIES: Trace bilateral peripheral edema  SKIN:  warm/dry; no obvious rash/lesions  MUSCULOSKELETAL: no joint deformity  NEURO: no focal deficit; NL affect    EKG: reviewed and available in Electronic Records   ASSESSMENT & PLAN:  HYPERTENSION, UNSPECIFIED This has become her most salient issue, and remains quite difficult to manage. Following her last OV, I added HCTZ 12.5 twice a day in an attempt to better control her HTN. However, she developed worsening renal function (peak creatinine 3.3), necessitating complete cessation of Demadex and HCTZ, with subsequent resumption of lisinopril at a lower dose, following improvement in her creatinine level. This may have been due to her known bilateral RAS, followed by Dr. Clifton James, with whom she is scheduled to follow in the next several weeks. In fact, she is scheduled for a renal artery duplex scan next month. Given this, I recommend she not resume HCTZ, but rather start Norvasc 10 mg daily and return for BP check here in our clinic, later this week.  Peripheral edema Based on recent evidence of exacerbation of chronic kidney disease, with known bilateral RAS, I have elected to not resume diuretic treatment for her chronic peripheral edema. Moreover, she has documented evidence of NL LVF and RVF, by recent echocardiography. Therefore, recommend conservative management employing use of compression hose (which she has used in the past), and elevating her legs whenever possible.  Chronic renal insufficiency Will check followup labs. Most recent assessment indicated improvement in her creatinine level, down to 1.9 (peak 3.3)  SINUS BRADYCARDIA I believe that this remains a contributing factor in her complaint of dizziness, fatigue, and gait instability. She has now had documented marked sinus bradycardia on several occasions. She remains on low-dose amiodarone and carvedilol, but in addition to Cardizem, which I decreased in the past for this problem. Therefore, I recommend cutting her Cardizem dose  in half for 1 week, then stopping it altogether. She is to continue on low-dose amiodarone, for maintenance of NSR, as well as carvedilol. Will reassess her vitals later this week, at which time we should see an improvement in her resting heart rate.   Atrial fibrillation Maintaining SB on low-dose amiodarone. On Coumadin anticoagulation, followed here in our clinic.  Chronic diastolic heart failure Compensated, by history and exam. Do not recommend resuming diuretic therapy, for reasons as outlined above.    Gene Tamaya Pun, PAC

## 2013-04-03 NOTE — Assessment & Plan Note (Signed)
This has become her most salient issue, and remains quite difficult to manage. Following her last OV, I added HCTZ 12.5 twice a day in an attempt to better control her HTN. However, she developed worsening renal function (peak creatinine 3.3), necessitating complete cessation of Demadex and HCTZ, with subsequent resumption of lisinopril at a lower dose, following improvement in her creatinine level. This may have been due to her known bilateral RAS, followed by Dr. Clifton James, with whom she is scheduled to follow in the next several weeks. In fact, she is scheduled for a renal artery duplex scan next month. Given this, I recommend she not resume HCTZ, but rather start Norvasc 10 mg daily, and return for BP check here in our clinic later this week.

## 2013-04-06 ENCOUNTER — Telehealth: Payer: Self-pay | Admitting: *Deleted

## 2013-04-06 ENCOUNTER — Ambulatory Visit (INDEPENDENT_AMBULATORY_CARE_PROVIDER_SITE_OTHER): Payer: Medicare Other | Admitting: *Deleted

## 2013-04-06 VITALS — BP 162/61 | HR 46 | Ht 64.0 in | Wt 130.0 lb

## 2013-04-06 DIAGNOSIS — I10 Essential (primary) hypertension: Secondary | ICD-10-CM

## 2013-04-06 DIAGNOSIS — I4891 Unspecified atrial fibrillation: Secondary | ICD-10-CM

## 2013-04-06 NOTE — Progress Notes (Signed)
The issue now is persistent bradycardia. I had hoped the BP would have improved somewhat, with addition of Norvasc 10 daily. This will have to be monitored closely in future. In meanwhile, we will go ahead and stop Cardizem now, given persistently low HRs in the 40 bpm range.

## 2013-04-06 NOTE — Telephone Encounter (Signed)
Patient notified of stable labs.

## 2013-04-06 NOTE — Progress Notes (Signed)
Patient presents to office today for nurse visit requested at last office visit for BP and HR. Patient also wanted to have her home monitor checked with office machine to check for accuracy. Patient denies dizziness, chest pain or sob. Patient has taken all medications without missing any doses and no side effects noted.

## 2013-04-06 NOTE — Progress Notes (Signed)
Patient notified and verbalized understanding of stable labs.    Also, notified of below.  She will d/c her Cardizem.    1 month follow up appointment scheduled for 05/12/2013 at 1:40 with Dr. Purvis Sheffield.

## 2013-04-07 ENCOUNTER — Other Ambulatory Visit: Payer: Self-pay | Admitting: *Deleted

## 2013-04-07 MED ORDER — LISINOPRIL 10 MG PO TABS: 10.0000 mg | ORAL_TABLET | Freq: Two times a day (BID) | ORAL | Status: DC

## 2013-04-14 ENCOUNTER — Ambulatory Visit (INDEPENDENT_AMBULATORY_CARE_PROVIDER_SITE_OTHER): Payer: Medicare Other | Admitting: *Deleted

## 2013-04-14 DIAGNOSIS — Z7901 Long term (current) use of anticoagulants: Secondary | ICD-10-CM

## 2013-04-14 DIAGNOSIS — I4891 Unspecified atrial fibrillation: Secondary | ICD-10-CM

## 2013-04-14 LAB — POCT INR: INR: 3.4

## 2013-04-17 ENCOUNTER — Encounter: Payer: Self-pay | Admitting: Cardiovascular Disease

## 2013-04-17 ENCOUNTER — Ambulatory Visit (INDEPENDENT_AMBULATORY_CARE_PROVIDER_SITE_OTHER): Payer: Medicare Other | Admitting: Cardiovascular Disease

## 2013-04-17 ENCOUNTER — Encounter (INDEPENDENT_AMBULATORY_CARE_PROVIDER_SITE_OTHER): Payer: Medicare Other

## 2013-04-17 VITALS — BP 142/60 | HR 54 | Ht 64.0 in | Wt 129.0 lb

## 2013-04-17 DIAGNOSIS — I701 Atherosclerosis of renal artery: Secondary | ICD-10-CM

## 2013-04-17 DIAGNOSIS — I10 Essential (primary) hypertension: Secondary | ICD-10-CM

## 2013-04-17 NOTE — Patient Instructions (Addendum)
Your physician recommends that you schedule a follow-up appointment as needed with Dr. Clifton James  Your physician has requested that you have a renal artery duplex. During this test, an ultrasound is used to evaluate blood flow to the kidneys. Allow one hour for this exam. Do not eat after midnight the day before and avoid carbonated beverages. Take your medications as you usually do.To be done in 12 months in [place] office.

## 2013-04-17 NOTE — Progress Notes (Signed)
History of Present Illness: 79 yo female with history of HTN, atrial fibrillation on chronic coumadin therapy, renal dysfunction, PAD who is here today for PV follow up. I saw her In May 2012 for evaluation of possible renal artery stenosis. Her cardiac issues are followed in the [place] office. She had a CT angiogram at [facility] in January 2012 that suggested at least 50 % bilateral renal artery stenosis. She did have renal insufficiency in the hospital this spring. HTN has been well controlled on medical therapy. We elected to hold off on renal artery angiogram as she had multiple other issues at that time including ongoing nausea. Renal artery dopplers 04/09/11 with less than 59% bilateral renal artery stenosis based on velocities. Renal artery dopplers today are stable with 1-59% right RAS and normal left renal artery velocities.   She tells me today that she is feeling well. Her BP has been elevated over last month. Her diuretics were stopped in [place] office due to bump in creatinine and she was started on Norvasc and Lisinopril increased. Blood pressures in the 130s to 140s systolic over last few days. Only c/o back pain.   Primary Care Physician: Dr. Sherril Croon in [place].    Past Medical History  Diagnosis Date  . Sinoatrial node dysfunction      SINUS BRADYCARDIA  . Hypertension     unspecified  . Atrial fibrillation   . Encounter for long-term (current) use of other medications     COUMADIN THERAPY  . Lower extremity weakness   . Bilateral renal artery stenosis     MODERATE by dopplers August 2012.  . Sinus bradycardia   . Chronic renal insufficiency     Past Surgical History  Procedure Laterality Date  . Knee arthroscopy    . Cardioversion      Current Outpatient Prescriptions  Medication Sig Dispense Refill  . amiodarone (PACERONE) 200 MG tablet Take 0.5 tablets (100 mg total) by mouth every evening.      Marland Kitchen amLODipine (NORVASC) 10 MG tablet Take 1 tablet (10 mg total) by mouth  daily.  30 tablet  6  . Biotin 5000 MCG TABS Take by mouth daily.      . Calcium Carbonate-Vitamin D (CALTRATE 600+D PO) Take 1 tablet by mouth daily.      . carvedilol (COREG) 6.25 MG tablet TAKE 1 TABLET BY MOUTH TWICE A DAY  60 tablet  0  . donepezil (ARICEPT) 5 MG tablet Take 5 mg by mouth at bedtime as needed.      Marland Kitchen HYDROcodone-acetaminophen (VICODIN) 5-500 MG per tablet Take 1 tablet by mouth at bedtime as needed.       Marland Kitchen lisinopril (PRINIVIL,ZESTRIL) 10 MG tablet Take 1 tablet (10 mg total) by mouth 2 (two) times daily.  60 tablet  6  . lovastatin (MEVACOR) 20 MG tablet Take 20 mg by mouth at bedtime.        Marland Kitchen omeprazole (PRILOSEC) 40 MG capsule Take 1 capsule by mouth Daily.      . prazosin (MINIPRESS) 2 MG capsule Take 2 mg by mouth 2 (two) times daily.      Marland Kitchen warfarin (COUMADIN) 5 MG tablet TAKE 1 TABLET BY MOUTH EVERY DAY AS DIRECTED  45 tablet  0   No current facility-administered medications for this visit.    No Known Allergies  History   Social History  . Marital Status: Married    Spouse Name: N/A    Number of Children: N/A  .  Years of Education: N/A   Occupational History  . RETIRED    Social History Main Topics  . Smoking status: Never Smoker   . Smokeless tobacco: Never Used  . Alcohol Use: No  . Drug Use: No  . Sexual Activity: Not on file   Other Topics Concern  . Not on file   Social History Narrative  . No narrative on file    Family History  Problem Relation Age of Onset  . Cancer Other   . Heart failure Other   . Cirrhosis Other     Review of Systems:  As stated in the HPI and otherwise negative.   BP 142/60  Pulse 54  Ht 5\' 4"  (1.626 m)  Wt 129 lb (58.514 kg)  BMI 22.13 kg/m2  SpO2 99%  Physical Examination: General: Well developed, well nourished, NAD HEENT: OP clear, mucus membranes moist SKIN: warm, dry. No rashes. Neuro: No focal deficits Musculoskeletal: Muscle strength 5/5 all ext Psychiatric: Mood and affect  normal Neck: No JVD, no carotid bruits, no thyromegaly, no lymphadenopathy. Lungs:Clear bilaterally, no wheezes, rhonci, crackles Cardiovascular: Regular rate and rhythm. No murmurs, gallops or rubs. Abdomen:Soft. Bowel sounds present. Non-tender.  Extremities: No lower extremity edema. Pulses are 1+ in the left DP/PT and right DP. Trace right PT. Handheld doppler with good signal in all vessels with dampened signal in the right PT.   Renal artery dopplers 04/17/13:  Normal caliber aorta. Normal bilateral kidney size. Stable 1-59% right renal artery stenosis. Normal velocities left renal artery.   Assessment and Plan:   1. Bilateral renal artery stenosis: Mild to moderate RAS on right with normal velocities on left.  Continue conservative management. Repeat renal artery dopplers in one year in [place] office. I do not think this has a significant hemodynamic affect given low grade disease. Her BP is well controlled now after recent addition of Lisinopril and Norvasc. Renal function stable per [facility] office notes. She will be seen back in [facility] clinic as needed. She can follow with yearly renal artery dopplers and I will be glad to see her back as needed.

## 2013-05-05 ENCOUNTER — Ambulatory Visit (INDEPENDENT_AMBULATORY_CARE_PROVIDER_SITE_OTHER): Payer: Medicare Other | Admitting: *Deleted

## 2013-05-05 DIAGNOSIS — Z7901 Long term (current) use of anticoagulants: Secondary | ICD-10-CM

## 2013-05-05 DIAGNOSIS — I4891 Unspecified atrial fibrillation: Secondary | ICD-10-CM

## 2013-05-05 LAB — POCT INR: INR: 4.3

## 2013-05-05 MED ORDER — WARFARIN SODIUM 2 MG PO TABS: 2.0000 mg | ORAL_TABLET | Freq: Every day | ORAL | Status: DC

## 2013-05-12 ENCOUNTER — Ambulatory Visit (INDEPENDENT_AMBULATORY_CARE_PROVIDER_SITE_OTHER): Payer: Medicare Other | Admitting: *Deleted

## 2013-05-12 ENCOUNTER — Encounter: Payer: Self-pay | Admitting: Cardiovascular Disease

## 2013-05-12 ENCOUNTER — Ambulatory Visit (INDEPENDENT_AMBULATORY_CARE_PROVIDER_SITE_OTHER): Payer: Medicare Other | Admitting: Cardiovascular Disease

## 2013-05-12 ENCOUNTER — Ambulatory Visit: Payer: Medicare Other | Admitting: Cardiovascular Disease

## 2013-05-12 VITALS — BP 175/65 | HR 56 | Ht 64.0 in | Wt 134.8 lb

## 2013-05-12 DIAGNOSIS — R2681 Unsteadiness on feet: Secondary | ICD-10-CM

## 2013-05-12 DIAGNOSIS — R609 Edema, unspecified: Secondary | ICD-10-CM

## 2013-05-12 DIAGNOSIS — R269 Unspecified abnormalities of gait and mobility: Secondary | ICD-10-CM

## 2013-05-12 DIAGNOSIS — I10 Essential (primary) hypertension: Secondary | ICD-10-CM

## 2013-05-12 DIAGNOSIS — I4891 Unspecified atrial fibrillation: Secondary | ICD-10-CM

## 2013-05-12 DIAGNOSIS — Z7901 Long term (current) use of anticoagulants: Secondary | ICD-10-CM

## 2013-05-12 DIAGNOSIS — R2689 Other abnormalities of gait and mobility: Secondary | ICD-10-CM

## 2013-05-12 DIAGNOSIS — I701 Atherosclerosis of renal artery: Secondary | ICD-10-CM

## 2013-05-12 DIAGNOSIS — I340 Nonrheumatic mitral (valve) insufficiency: Secondary | ICD-10-CM

## 2013-05-12 DIAGNOSIS — I059 Rheumatic mitral valve disease, unspecified: Secondary | ICD-10-CM

## 2013-05-12 DIAGNOSIS — N189 Chronic kidney disease, unspecified: Secondary | ICD-10-CM

## 2013-05-12 DIAGNOSIS — I5032 Chronic diastolic (congestive) heart failure: Secondary | ICD-10-CM

## 2013-05-12 LAB — POCT INR: INR: 3.6

## 2013-05-12 NOTE — Progress Notes (Signed)
Patient ID: [name], female   DOB: 09-14-1928, 79 y.o.   MRN: [id number]      SUBJECTIVE: [name] is an 79 yo female with a history of HTN, atrial fibrillation on chronic coumadin therapy, renal dysfunction, PAD, right renal artery stenosis (1-59% in 04/2013), and chronic diastolic heart failure.  She's been having back problems but otherwise feels well. She's had some leg edema primarily around the ankles for which she's wearing compression stockings. She denies chest pain and shortness of breath.  She checks her BP at home regularly and her SBP runs in the 130 mmHg range, and very seldom gets over 140 mmHg. She has brought her monitor into the office in the past to be certain it was calibrated appropriately.  Echocardiography done on 02-01-13 showed normal LV systolic function, EF 60-65%, with mild to perhaps moderate mitral regurgitation, with no evidence of stenosis.     No Known Allergies  Current Outpatient Prescriptions  Medication Sig Dispense Refill  . amiodarone (PACERONE) 200 MG tablet Take 0.5 tablets (100 mg total) by mouth every evening.      Marland Kitchen amLODipine (NORVASC) 10 MG tablet Take 1 tablet (10 mg total) by mouth daily.  30 tablet  6  . Biotin 5000 MCG TABS Take by mouth daily.      . Calcium Carbonate-Vitamin D (CALTRATE 600+D PO) Take 1 tablet by mouth daily.      . carvedilol (COREG) 6.25 MG tablet TAKE 1 TABLET BY MOUTH TWICE A DAY  60 tablet  0  . donepezil (ARICEPT) 5 MG tablet Take 5 mg by mouth at bedtime as needed.      Marland Kitchen HYDROcodone-acetaminophen (VICODIN) 5-500 MG per tablet Take 1 tablet by mouth at bedtime as needed.       Marland Kitchen lisinopril (PRINIVIL,ZESTRIL) 10 MG tablet Take 1 tablet (10 mg total) by mouth 2 (two) times daily.  60 tablet  6  . lovastatin (MEVACOR) 20 MG tablet Take 20 mg by mouth at bedtime.        Marland Kitchen omeprazole (PRILOSEC) 40 MG capsule Take 1 capsule by mouth Daily.      . prazosin (MINIPRESS) 2 MG capsule Take 2 mg by mouth 2 (two)  times daily.      Marland Kitchen warfarin (COUMADIN) 2 MG tablet Take 1 tablet (2 mg total) by mouth daily.  30 tablet  3   No current facility-administered medications for this visit.    Past Medical History  Diagnosis Date  . Sinoatrial node dysfunction      SINUS BRADYCARDIA  . Hypertension     unspecified  . Atrial fibrillation   . Encounter for long-term (current) use of other medications     COUMADIN THERAPY  . Lower extremity weakness   . Bilateral renal artery stenosis     MODERATE by dopplers August 2012.  . Sinus bradycardia   . Chronic renal insufficiency     Past Surgical History  Procedure Laterality Date  . Knee arthroscopy    . Cardioversion      History   Social History  . Marital Status: Married    Spouse Name: N/A    Number of Children: N/A  . Years of Education: N/A   Occupational History  . RETIRED    Social History Main Topics  . Smoking status: Never Smoker   . Smokeless tobacco: Never Used  . Alcohol Use: No  . Drug Use: No  . Sexual Activity: Not on file   Other  Topics Concern  . Not on file   Social History Narrative  . No narrative on file     Filed Vitals:   05/12/13 1455  BP: 175/65  Pulse: 56  Height: 5\' 4"  (1.626 m)  Weight: 134 lb 12.8 oz (61.145 kg)    PHYSICAL EXAM General: NAD Neck: No JVD, no thyromegaly or thyroid nodule.  Lungs: Clear to auscultation bilaterally with normal respiratory effort. CV: Nondisplaced PMI.  Heart regular S1/S2, no S3/S4, II/VI holosystolic murmur.  No peripheral edema.  No carotid bruit.  Normal pedal pulses.  Abdomen: Soft, nontender, no hepatosplenomegaly, no distention.  Neurologic: Alert and oriented x 3.  Psych: Normal affect. Extremities: No clubbing or cyanosis.   ECG: reviewed and available in electronic records.      ASSESSMENT AND PLAN:  HYPERTENSION, UNSPECIFIED  While it is elevated in the office today, it has been normotensive over the last several weeks. I have asked the  patient to check blood pressure readings 4-5 times per week, at different times throughout the day, in order to get a better approximation of mean BP values. These results will be provided to me at the end of that period so that I can determine if antihypertensive medication titration is indicated.  Peripheral edema  Based on recent evidence of exacerbation of chronic kidney disease, with known right RAS, I have elected to not resume diuretic treatment for her chronic peripheral edema. Moreover, she has documented evidence of NL LVF and RVF, by recent echocardiography. Therefore, recommend conservative management employing continued use of compression hose, and elevating her legs whenever possible.   Chronic renal insufficiency  Stable at present. Continue to monitor.  SINUS BRADYCARDIA  I'm not certain she has symptomatic sinus bradycardia at this time. She is Coreg and amiodarone at present, and I will continue current doses.  Atrial fibrillation  Maintaining SB on low-dose amiodarone. On Coumadin anticoagulation, followed here in our clinic.   Chronic diastolic heart failure  Compensated, by history and exam. Do not recommend resuming diuretic therapy, for reasons as outlined above.   Gait instability I will make a referral to neurology.      Prentice Docker, M.D., F.A.C.C.

## 2013-05-12 NOTE — Patient Instructions (Addendum)
Your physician recommends that you schedule a follow-up appointment in: 6 months. You will receive a reminder letter in the mail in about 4 months reminding you to call and schedule your appointment. If you don't receive this letter, please contact our office. Your physician recommends that you continue on your current medications as directed. Please refer to the Current Medication list given to you today. You are being referred to [facility]. You will be contacted about this appointment.

## 2013-05-18 ENCOUNTER — Ambulatory Visit (INDEPENDENT_AMBULATORY_CARE_PROVIDER_SITE_OTHER): Payer: Medicare Other | Admitting: Neurology

## 2013-05-18 ENCOUNTER — Encounter: Payer: Self-pay | Admitting: Neurology

## 2013-05-18 VITALS — BP 138/55 | HR 55 | Ht 63.0 in | Wt 134.0 lb

## 2013-05-18 DIAGNOSIS — R609 Edema, unspecified: Secondary | ICD-10-CM

## 2013-05-18 DIAGNOSIS — I428 Other cardiomyopathies: Secondary | ICD-10-CM

## 2013-05-18 DIAGNOSIS — I420 Dilated cardiomyopathy: Secondary | ICD-10-CM

## 2013-05-18 DIAGNOSIS — I10 Essential (primary) hypertension: Secondary | ICD-10-CM

## 2013-05-18 DIAGNOSIS — R269 Unspecified abnormalities of gait and mobility: Secondary | ICD-10-CM

## 2013-05-18 DIAGNOSIS — Z7901 Long term (current) use of anticoagulants: Secondary | ICD-10-CM

## 2013-05-18 NOTE — Progress Notes (Signed)
[facility]  PATIENT: [name] DOB: 12/29/1928  HISTORICAL Mrs. Ilg is a 79 years old right-handed Caucasian female, accompanied by her husband, referred by her primary care physician Dr. Sherril Croon for evaluation of gait difficulty  She had past medical history of coronary artery disease, congestive heart failure, atrial fibrillation, on chronic Coumadin treatment, hypertension, anxiety, compression fracture of vertebra body, chronic low back pain  She presented with 2 years history of rapid onset gait difficulty, she also complains of neck pain, limited range of motion of her neck, she denies significant bilateral lower extremity weakness, but complains bilateral feet numbness, she complains of feeling unbalanced while walking, she denies upper extremity numbness, or weakness, she denies bowel or bladder incontinence.  She has chronic low back pain, but denies shooting pain to her lower extremities, she denies visual loss.  REVIEW OF SYSTEMS: Full 14 system review of systems performed and notable only for  sweating Imovax, diarrhea, easy bruising, feeling cold, increased thirst, memory loss, confusion, headaches, tremor, depression, decreased energy, racing thoughts,  ALLERGIES: No Known Allergies  HOME MEDICATIONS: Outpatient Prescriptions Prior to Visit  Medication Sig Dispense Refill  . amiodarone (PACERONE) 200 MG tablet Take 0.5 tablets (100 mg total) by mouth every evening.      Marland Kitchen amLODipine (NORVASC) 10 MG tablet Take 1 tablet (10 mg total) by mouth daily.  30 tablet  6  . Biotin 5000 MCG TABS Take by mouth daily.      . Calcium Carbonate-Vitamin D (CALTRATE 600+D PO) Take 1 tablet by mouth daily.      . carvedilol (COREG) 6.25 MG tablet TAKE 1 TABLET BY MOUTH TWICE A DAY  60 tablet  0  . donepezil (ARICEPT) 5 MG tablet Take 5 mg by mouth at bedtime as needed.      Marland Kitchen HYDROcodone-acetaminophen (VICODIN) 5-500 MG per tablet Take 1 tablet by mouth at  bedtime as needed.       Marland Kitchen lisinopril (PRINIVIL,ZESTRIL) 10 MG tablet Take 1 tablet (10 mg total) by mouth 2 (two) times daily.  60 tablet  6  . lovastatin (MEVACOR) 20 MG tablet Take 20 mg by mouth at bedtime.        Marland Kitchen omeprazole (PRILOSEC) 40 MG capsule Take 1 capsule by mouth Daily.      . prazosin (MINIPRESS) 2 MG capsule Take 2 mg by mouth 2 (two) times daily.      Marland Kitchen warfarin (COUMADIN) 2 MG tablet Take 1 tablet (2 mg total) by mouth daily.  30 tablet  3   No facility-administered medications prior to visit.    PAST MEDICAL HISTORY: Past Medical History  Diagnosis Date  . Sinoatrial node dysfunction      SINUS BRADYCARDIA  . Hypertension     unspecified  . Atrial fibrillation   . Encounter for long-term (current) use of other medications     COUMADIN THERAPY  . Lower extremity weakness   . Bilateral renal artery stenosis     MODERATE by dopplers August 2012.  . Sinus bradycardia   . Chronic renal insufficiency     PAST SURGICAL HISTORY: Past Surgical History  Procedure Laterality Date  . Knee arthroscopy    . Cardioversion      FAMILY HISTORY: Family History  Problem Relation Age of Onset  . Cancer Brother   . Heart failure Mother   . Cirrhosis Other   . High blood pressure Mother     SOCIAL HISTORY:  History  Social History  . Marital Status: Married    Spouse Name: [name]    Number of Children: 0  . Years of Education: 12   Occupational History  . RETIRED   .     Social History Main Topics  . Smoking status: Never Smoker   . Smokeless tobacco: Never Used  . Alcohol Use: No  . Drug Use: No  . Sexual Activity: Not on file   Other Topics Concern  . Not on file   Social History Narrative   Patient lives at home with her husband [name]) Patient is retired. Patient has 12 th grade education.   Right handed.   Caffeine- two three cups of coffee daily.     PHYSICAL EXAM   Filed Vitals:   05/18/13 1036  BP: 138/55  Pulse: 55  Height: 5'  3" (1.6 m)  Weight: 134 lb (60.782 kg)   Body mass index is 23.74 kg/(m^2).   Generalized: In no acute distress  Neck: Supple, no carotid bruits   Cardiac: Regular rate rhythm  Pulmonary: Clear to auscultation bilaterally  Musculoskeletal: No deformity  Neurological examination  Mentation: Alert oriented to time, place, history taking, and causual conversation  Cranial nerve II-XII: Pupils were equal round reactive to light extraocular movements were full, visual field were full on confrontational test. facial sensation and strength were normal. hearing was intact to finger rubbing bilaterally. Uvula tongue midline.  head turning and shoulder shrug and were normal and symmetric.Tongue protrusion into cheek strength was normal.  Motor: normal tone, bulk and strength.  Sensory: Intact to fine touch, pinprick, preserved vibratory sensation, and proprioception at toes.  Coordination: Normal finger to nose, heel-to-shin bilaterally there was no truncal ataxia  Gait: Rising up from seated position without assistance, normal stance, without trunk ataxia, moderate stride, good arm swing, smooth turning, able to perform tiptoe, and heel walking without difficulty.   Romberg signs: Negative  Deep tendon reflexes: Brachioradialis 2+/2+, biceps 2+/2+, triceps 2/2, patellar 3/3, Achilles 2/2, plantar responses were extensor bilaterally.   DIAGNOSTIC DATA (LABS, IMAGING, TESTING) - I reviewed patient records, labs, notes, testing and imaging myself where available.  ASSESSMENT AND PLAN   79 years old right-handed Caucasian female, with 2 years history of rapid onset gait difficulty, she has history of compression fracture, hyperreflexia, bilateral Babinski signs,  Need to rule out cervical spondylitic myelopathy, proceed with MRI of cervical spine, outpatient physical therapy, return to clinic in 2 months      Levert Feinstein, M.D. Ph.D.  [facility] [address],  [address] [place], [place] [address] [phone]

## 2013-05-26 ENCOUNTER — Telehealth: Payer: Self-pay | Admitting: *Deleted

## 2013-05-26 NOTE — Telephone Encounter (Signed)
Pt scheduled to have upper back surgery 10/21 by Dr Channing Mutters.  He had her stop coumadin on 10/15.  Appt made to check INR 10 days after surgery.

## 2013-05-29 ENCOUNTER — Telehealth: Payer: Self-pay | Admitting: *Deleted

## 2013-05-30 ENCOUNTER — Telehealth: Payer: Self-pay | Admitting: Cardiovascular Disease

## 2013-05-30 NOTE — Telephone Encounter (Signed)
Patient informed that per coumadin nurse this morning that it is okay to take these two medications together.

## 2013-05-30 NOTE — Telephone Encounter (Signed)
[name] is calling today about being PLACED ON CIPRO . She states that [facility] gave her a paper saying that Taking Cipro may interfere with her Coumdin. Please advise patient if it is ok for her to continue taking Coumdin  And Cipro.

## 2013-05-30 NOTE — Telephone Encounter (Signed)
Was off coumadin 5 days for back surgery yesterday, but surgery was cancelled due to UTI.  Pt is to start cipro today and she restarted coumadin yesterday.  Will will come for INR check ON 10/31.  Surgery will be rescheduled at a later date.

## 2013-06-03 ENCOUNTER — Other Ambulatory Visit: Payer: Self-pay | Admitting: Cardiology

## 2013-06-09 ENCOUNTER — Ambulatory Visit (INDEPENDENT_AMBULATORY_CARE_PROVIDER_SITE_OTHER): Payer: Medicare Other | Admitting: *Deleted

## 2013-06-09 DIAGNOSIS — I4891 Unspecified atrial fibrillation: Secondary | ICD-10-CM

## 2013-06-09 DIAGNOSIS — Z7901 Long term (current) use of anticoagulants: Secondary | ICD-10-CM

## 2013-06-14 ENCOUNTER — Telehealth: Payer: Self-pay | Admitting: *Deleted

## 2013-06-14 NOTE — Telephone Encounter (Signed)
Correct.  Has already been cleared.  OK to stop coumadin tonight and restart night of procedure at regular dose.  Needs INR appt 10 days after surgery.

## 2013-06-14 NOTE — Telephone Encounter (Signed)
Wanting the okay for patient to stop her Coumadin today.  Dr. Channing Mutters wants to schedule her back surgery for November 12 if possible.  Had already been given cardiac clearance previously.  Was initially scheduled, but had to put on hold due to UTI.   Do not think she needs bridging.  Please advise.

## 2013-06-14 NOTE — Telephone Encounter (Signed)
Dr. Temple Pacini office notified.  Next INR check is scheduled for Friday, 06/30/2013 at 10:50.

## 2013-06-19 ENCOUNTER — Other Ambulatory Visit: Payer: Medicare Other

## 2013-06-22 ENCOUNTER — Other Ambulatory Visit: Payer: Medicare Other

## 2013-06-27 ENCOUNTER — Ambulatory Visit (INDEPENDENT_AMBULATORY_CARE_PROVIDER_SITE_OTHER): Payer: Medicare Other | Admitting: *Deleted

## 2013-06-27 DIAGNOSIS — Z7901 Long term (current) use of anticoagulants: Secondary | ICD-10-CM

## 2013-06-27 DIAGNOSIS — I4891 Unspecified atrial fibrillation: Secondary | ICD-10-CM

## 2013-07-14 ENCOUNTER — Ambulatory Visit (INDEPENDENT_AMBULATORY_CARE_PROVIDER_SITE_OTHER): Payer: Medicare Other | Admitting: *Deleted

## 2013-07-14 DIAGNOSIS — I4891 Unspecified atrial fibrillation: Secondary | ICD-10-CM

## 2013-07-14 DIAGNOSIS — Z7901 Long term (current) use of anticoagulants: Secondary | ICD-10-CM

## 2013-08-11 ENCOUNTER — Ambulatory Visit (INDEPENDENT_AMBULATORY_CARE_PROVIDER_SITE_OTHER): Payer: Medicare Other | Admitting: *Deleted

## 2013-08-11 DIAGNOSIS — I4891 Unspecified atrial fibrillation: Secondary | ICD-10-CM

## 2013-08-11 DIAGNOSIS — Z7901 Long term (current) use of anticoagulants: Secondary | ICD-10-CM

## 2013-08-11 LAB — POCT INR: INR: 4.1

## 2013-08-15 ENCOUNTER — Ambulatory Visit: Payer: No Typology Code available for payment source | Admitting: Neurology

## 2013-08-16 ENCOUNTER — Ambulatory Visit (INDEPENDENT_AMBULATORY_CARE_PROVIDER_SITE_OTHER): Payer: Medicare Other | Admitting: Cardiovascular Disease

## 2013-08-16 ENCOUNTER — Encounter: Payer: Self-pay | Admitting: Cardiovascular Disease

## 2013-08-16 VITALS — BP 111/63 | HR 50 | Ht 64.0 in | Wt 130.0 lb

## 2013-08-16 DIAGNOSIS — I5032 Chronic diastolic (congestive) heart failure: Secondary | ICD-10-CM

## 2013-08-16 DIAGNOSIS — I701 Atherosclerosis of renal artery: Secondary | ICD-10-CM

## 2013-08-16 DIAGNOSIS — R269 Unspecified abnormalities of gait and mobility: Secondary | ICD-10-CM

## 2013-08-16 DIAGNOSIS — I4891 Unspecified atrial fibrillation: Secondary | ICD-10-CM

## 2013-08-16 DIAGNOSIS — I495 Sick sinus syndrome: Secondary | ICD-10-CM

## 2013-08-16 DIAGNOSIS — R600 Localized edema: Secondary | ICD-10-CM

## 2013-08-16 DIAGNOSIS — R609 Edema, unspecified: Secondary | ICD-10-CM

## 2013-08-16 DIAGNOSIS — R2681 Unsteadiness on feet: Secondary | ICD-10-CM

## 2013-08-16 DIAGNOSIS — N184 Chronic kidney disease, stage 4 (severe): Secondary | ICD-10-CM

## 2013-08-16 DIAGNOSIS — I10 Essential (primary) hypertension: Secondary | ICD-10-CM

## 2013-08-16 MED ORDER — CARVEDILOL 6.25 MG PO TABS: 3.1250 mg | ORAL_TABLET | Freq: Two times a day (BID) | ORAL | Status: DC

## 2013-08-16 NOTE — Patient Instructions (Signed)
   Decrease Coreg (Carvedilol) to 3.125mg  twice a day  (may take 1/2 tablet of your 6.25mg  tabs) Continue all other medications.   Compression stockings - new order given today Follow up in  6 weeks

## 2013-08-16 NOTE — Progress Notes (Signed)
Patient ID: [name], female   DOB: 12/17/1928, 80 y.o.   MRN: [id number]      SUBJECTIVE: [name] is an 80 yo female with a history of HTN, atrial fibrillation on chronic coumadin therapy, renal dysfunction, PAD, right renal artery stenosis (1-59% in 04/2013), and chronic diastolic heart failure.  Echocardiography done on 02-01-13 showed normal LV systolic function, EF 60-65%, with mild to perhaps moderate mitral regurgitation, with no evidence of stenosis. She continues to experience leg swelling and has done so for the past 2 months. She has been taking Lasix every other day as prescribed by her primary care physician and while this is given her some relief, it is not completely resolved. She is wearing knee-high compression stockings but tells me that they are very old. She feels that her left leg is more swollen than her right leg. She has a mild degree of pain when putting on her compression stockings, but denies severe calf pain and cramping. She has had recalcitrant hypertension and use of diuretics in the past has led to worsening of her chronic kidney disease. Her blood pressure has been well controlled at home.  She denies lightheadedness and dizziness.    No Known Allergies  Current Outpatient Prescriptions  Medication Sig Dispense Refill  . amiodarone (PACERONE) 200 MG tablet Take 0.5 tablets (100 mg total) by mouth every evening.      Marland Kitchen. amLODipine (NORVASC) 10 MG tablet Take 1 tablet (10 mg total) by mouth daily.  30 tablet  6  . Biotin 5000 MCG TABS Take by mouth daily.      . Calcium Carbonate-Vitamin D (CALTRATE 600+D PO) Take 1 tablet by mouth daily.      . carvedilol (COREG) 6.25 MG tablet TAKE 1 TABLET BY MOUTH TWICE A DAY  60 tablet  0  . donepezil (ARICEPT) 5 MG tablet Take 5 mg by mouth at bedtime as needed.      Marland Kitchen. HYDROcodone-acetaminophen (VICODIN) 5-500 MG per tablet Take 1 tablet by mouth at bedtime as needed.       Marland Kitchen. lisinopril (PRINIVIL,ZESTRIL) 10 MG  tablet Take 1 tablet (10 mg total) by mouth 2 (two) times daily.  60 tablet  6  . lovastatin (MEVACOR) 20 MG tablet Take 20 mg by mouth at bedtime.        Marland Kitchen. omeprazole (PRILOSEC) 40 MG capsule Take 1 capsule by mouth Daily.      . prazosin (MINIPRESS) 2 MG capsule Take 2 mg by mouth 2 (two) times daily.      Marland Kitchen. warfarin (COUMADIN) 2 MG tablet TAKE 1 TABLET BY MOUTH ONCE DAILY  90 tablet  3   No current facility-administered medications for this visit.    Past Medical History  Diagnosis Date  . Sinoatrial node dysfunction      SINUS BRADYCARDIA  . Hypertension     unspecified  . Atrial fibrillation   . Encounter for long-term (current) use of other medications     COUMADIN THERAPY  . Lower extremity weakness   . Bilateral renal artery stenosis     MODERATE by dopplers August 2012.  . Sinus bradycardia   . Chronic renal insufficiency     Past Surgical History  Procedure Laterality Date  . Knee arthroscopy    . Cardioversion      History   Social History  . Marital Status: Married    Spouse Name: [name]    Number of Children: 0  . Years of Education:  12   Occupational History  . RETIRED   .     Social History Main Topics  . Smoking status: Never Smoker   . Smokeless tobacco: Never Used  . Alcohol Use: No  . Drug Use: No  . Sexual Activity: Not on file   Other Topics Concern  . Not on file   Social History Narrative   Patient lives at home with her husband [name]) Patient is retired. Patient has 12 th grade education.   Right handed.   Caffeine- two three cups of coffee daily.     Filed Vitals:   08/16/13 0858  Height: 5\' 4"  (1.626 m)  Weight: 130 lb (58.968 kg)   BP 111/63  Pulse 50    PHYSICAL EXAM General: NAD  Neck: No JVD, no thyromegaly or thyroid nodule.  Lungs: Clear to auscultation bilaterally with normal respiratory effort.  CV: Nondisplaced PMI. Heart regular S1/S2, no S3/S4, II/VI holosystolic murmur. 1+ peripheral edema left greater  than right. No carotid bruit. Normal pedal pulses.  Abdomen: Soft, nontender, no hepatosplenomegaly, no distention.  Neurologic: Alert and oriented x 3.  Psych: Normal affect.  Extremities: No clubbing or cyanosis.   ECG: reviewed and available in electronic records.      ASSESSMENT AND PLAN: HYPERTENSION, UNSPECIFIED  Well controlled on present therapy.  Peripheral edema  Based on evidence of exacerbation of chronic kidney disease in the past, with known right renal artery stenosis, I have elected to not resume diuretic treatment for her chronic peripheral edema. Moreover, she has documented evidence of NL LVF and RVF by echocardiography. Therefore, I recommend conservative management employing continued use of compression hose, and elevating her legs whenever possible.  Given that her stockings are old, I will prescribe new knee-high 20-30 mmHg stockings.  Chronic renal insufficiency  Stable at present. Continue to monitor.   SINUS BRADYCARDIA  I'm not certain she has symptomatic sinus bradycardia at this time. She is on Coreg and amiodarone at present, and I will reduce her Coreg to 3.125 mg bid (HR 50 bpm today) while monitoring her BP.   Atrial fibrillation  Maintaining SB on low-dose amiodarone. On Coumadin anticoagulation, followed here in our clinic.   Chronic diastolic heart failure  Only leg edema and without pulmonary edema. Again, I do not recommend resuming diuretic therapy, for reasons as outlined above. Compression stockings as stated above.  Gait instability  I previously made a referral to neurology, and she is now being managed by Dr. Terrace Arabia.    Prentice Docker, M.D., F.A.C.C.

## 2013-08-25 ENCOUNTER — Ambulatory Visit (INDEPENDENT_AMBULATORY_CARE_PROVIDER_SITE_OTHER): Payer: Medicare Other | Admitting: *Deleted

## 2013-08-25 DIAGNOSIS — Z7901 Long term (current) use of anticoagulants: Secondary | ICD-10-CM

## 2013-08-25 DIAGNOSIS — I4891 Unspecified atrial fibrillation: Secondary | ICD-10-CM

## 2013-08-25 LAB — POCT INR: INR: 2.1

## 2013-09-15 ENCOUNTER — Ambulatory Visit (INDEPENDENT_AMBULATORY_CARE_PROVIDER_SITE_OTHER): Payer: Medicare Other | Admitting: *Deleted

## 2013-09-15 DIAGNOSIS — Z5181 Encounter for therapeutic drug level monitoring: Secondary | ICD-10-CM

## 2013-09-15 DIAGNOSIS — I4891 Unspecified atrial fibrillation: Secondary | ICD-10-CM

## 2013-09-15 DIAGNOSIS — Z7901 Long term (current) use of anticoagulants: Secondary | ICD-10-CM

## 2013-09-15 LAB — POCT INR: INR: 2

## 2013-09-21 ENCOUNTER — Encounter: Payer: Self-pay | Admitting: Cardiovascular Disease

## 2013-09-21 ENCOUNTER — Telehealth: Payer: Self-pay | Admitting: Cardiology

## 2013-09-21 ENCOUNTER — Ambulatory Visit (INDEPENDENT_AMBULATORY_CARE_PROVIDER_SITE_OTHER): Payer: Medicare Other | Admitting: Cardiovascular Disease

## 2013-09-21 VITALS — BP 145/71 | HR 55 | Ht 64.0 in | Wt 127.0 lb

## 2013-09-21 DIAGNOSIS — I701 Atherosclerosis of renal artery: Secondary | ICD-10-CM

## 2013-09-21 DIAGNOSIS — I4891 Unspecified atrial fibrillation: Secondary | ICD-10-CM

## 2013-09-21 DIAGNOSIS — I495 Sick sinus syndrome: Secondary | ICD-10-CM

## 2013-09-21 DIAGNOSIS — R609 Edema, unspecified: Secondary | ICD-10-CM

## 2013-09-21 DIAGNOSIS — N184 Chronic kidney disease, stage 4 (severe): Secondary | ICD-10-CM

## 2013-09-21 DIAGNOSIS — Z79899 Other long term (current) drug therapy: Secondary | ICD-10-CM

## 2013-09-21 DIAGNOSIS — I5032 Chronic diastolic (congestive) heart failure: Secondary | ICD-10-CM

## 2013-09-21 DIAGNOSIS — I10 Essential (primary) hypertension: Secondary | ICD-10-CM

## 2013-09-21 NOTE — Patient Instructions (Addendum)
Your physician recommends that you schedule a follow-up appointment in: 6 months with Dr. Purvis Sheffield. You should receive a letter in the mail in 4 months. If you do not receive this letter by June 2015 call our office to schedule this appointment.   Your physician has recommended you make the following change in your medication:  Stop: Furosemide (Lasix) Stop: Potassium (Klor Con)  Continue all other medications the same.   Call our office today at [phone] and tell us the dose of your lasix and potassium.

## 2013-09-21 NOTE — Progress Notes (Signed)
Patient ID: [name], female   DOB: 01/25/1929, 80 y.o.   MRN: [id number]      SUBJECTIVE: [name] is an 80 yo female with a history of HTN, atrial fibrillation on chronic coumadin therapy, renal dysfunction, PAD, right renal artery stenosis (1-59% in 04/2013), and chronic diastolic heart failure.  Echocardiography done on 02-01-13 showed normal LV systolic function, EF 60-65%, with mild to perhaps moderate mitral regurgitation, with no evidence of stenosis.  Since using the knee-high compression stockings I prescribed at her last visit, she has been doing very well and no longer has leg swelling. She has been exercising more as well. She continues to takes Lasix and potassium supplements every other day. She denies shortness of breath.     No Known Allergies  Current Outpatient Prescriptions  Medication Sig Dispense Refill  . ALPRAZolam (XANAX) 0.5 MG tablet Take 0.25 mg by mouth at bedtime.      Marland Kitchen. amiodarone (PACERONE) 200 MG tablet Take 0.5 tablets (100 mg total) by mouth every evening.      Marland Kitchen. amLODipine (NORVASC) 10 MG tablet Take 1 tablet (10 mg total) by mouth daily.  30 tablet  6  . Biotin 5000 MCG TABS Take 1 tablet by mouth daily.       . Calcium Carbonate-Vitamin D (CALTRATE 600+D PO) Take 1 tablet by mouth 2 (two) times daily.       . carvedilol (COREG) 6.25 MG tablet Take 0.5 tablets (3.125 mg total) by mouth 2 (two) times daily.      Marland Kitchen. donepezil (ARICEPT) 5 MG tablet Take 5 mg by mouth at bedtime.       Marland Kitchen. lisinopril (PRINIVIL,ZESTRIL) 10 MG tablet Take 1 tablet (10 mg total) by mouth 2 (two) times daily.  60 tablet  6  . lovastatin (MEVACOR) 20 MG tablet Take 20 mg by mouth at bedtime.        Marland Kitchen. omeprazole (PRILOSEC) 40 MG capsule Take 1 capsule by mouth Daily.      . prazosin (MINIPRESS) 2 MG capsule Take 2 mg by mouth 2 (two) times daily.      Marland Kitchen. warfarin (COUMADIN) 2 MG tablet TAKE 1 TABLET BY MOUTH ONCE DAILY  90 tablet  3  . FUROSEMIDE PO Take 1 tablet by mouth  every other day.      . Potassium Chloride CRYS 1 tablet by Does not apply route every other day.       No current facility-administered medications for this visit.    Past Medical History  Diagnosis Date  . Sinoatrial node dysfunction      SINUS BRADYCARDIA  . Hypertension     unspecified  . Atrial fibrillation   . Encounter for long-term (current) use of other medications     COUMADIN THERAPY  . Lower extremity weakness   . Bilateral renal artery stenosis     MODERATE by dopplers August 2012.  . Sinus bradycardia   . Chronic renal insufficiency     Past Surgical History  Procedure Laterality Date  . Knee arthroscopy    . Cardioversion      History   Social History  . Marital Status: Married    Spouse Name: [name]    Number of Children: 0  . Years of Education: 12   Occupational History  . RETIRED   .     Social History Main Topics  . Smoking status: Never Smoker   . Smokeless tobacco: Never Used  . Alcohol Use: No  .  Drug Use: No  . Sexual Activity: Not on file   Other Topics Concern  . Not on file   Social History Narrative   Patient lives at home with her husband [name]) Patient is retired. Patient has 12 th grade education.   Right handed.   Caffeine- two three cups of coffee daily.     Filed Vitals:   09/21/13 1047  BP: 145/71  Pulse: 55  Height: 5\' 4"  (1.626 m)  Weight: 127 lb (57.607 kg)  SpO2: 99%    PHYSICAL EXAM General: NAD  Neck: No JVD, no thyromegaly or thyroid nodule.  Lungs: Clear to auscultation bilaterally with normal respiratory effort.  CV: Nondisplaced PMI. Heart regular S1/S2, no S3/S4, II/VI holosystolic murmur. No pretibial edema. No carotid bruit. Normal pedal pulses.  Abdomen: Soft, nontender, no hepatosplenomegaly, no distention.  Neurologic: Alert and oriented x 3.  Psych: Normal affect.  Extremities: No clubbing or cyanosis.    ECG: reviewed and available in electronic records.      ASSESSMENT AND  PLAN:  HYPERTENSION, UNSPECIFIED  Well controlled on present therapy.   Peripheral edema  Based on evidence of exacerbation of chronic kidney disease in the past, with known right renal artery stenosis, I have elected to not resume diuretic treatment for her chronic peripheral edema. Moreover, she has documented evidence of NL LVF and RVF by echocardiography. Therefore, I recommend conservative management employing continued use of compression hose, knee-high 20-30 mmHg, and elevating her legs whenever possible.  This has appeared to have alleviated her swelling altogether.  Chronic renal insufficiency  Stable at present. Continue to monitor.   SINUS BRADYCARDIA  She is currently asymptomatic. She is on Coreg and amiodarone at present, and I reduced Coreg to 3.125 mg bid (HR 55 bpm today) at her last visit. Continue to monitor.  Atrial fibrillation  Maintaining sinus bradycardia on low-dose amiodarone. On Coumadin for anticoagulation, followed here in our clinic.   Chronic diastolic heart failure  Again, I do not recommend resuming diuretic therapy, for reasons as outlined above. Compression stockings as stated above.   Gait instability  I previously made a referral to neurology, and she is now being managed by Dr. Terrace Arabia.   Dispo: f/u 6 months.  Prentice Docker, M.D., F.A.C.C.

## 2013-09-21 NOTE — Telephone Encounter (Signed)
Pt called and said she was taking lasix 20 MG one tablet once daily and potassium 10 MEG 1 tablet every other day.

## 2013-10-03 ENCOUNTER — Other Ambulatory Visit: Payer: Self-pay | Admitting: Physician Assistant

## 2013-10-06 ENCOUNTER — Ambulatory Visit (INDEPENDENT_AMBULATORY_CARE_PROVIDER_SITE_OTHER): Payer: Medicare Other | Admitting: *Deleted

## 2013-10-06 DIAGNOSIS — I4891 Unspecified atrial fibrillation: Secondary | ICD-10-CM

## 2013-10-06 DIAGNOSIS — Z7901 Long term (current) use of anticoagulants: Secondary | ICD-10-CM

## 2013-10-06 DIAGNOSIS — Z5181 Encounter for therapeutic drug level monitoring: Secondary | ICD-10-CM

## 2013-10-06 LAB — POCT INR: INR: 1.4

## 2013-10-20 ENCOUNTER — Ambulatory Visit (INDEPENDENT_AMBULATORY_CARE_PROVIDER_SITE_OTHER): Payer: Medicare Other | Admitting: *Deleted

## 2013-10-20 DIAGNOSIS — I4891 Unspecified atrial fibrillation: Secondary | ICD-10-CM

## 2013-10-20 DIAGNOSIS — Z5181 Encounter for therapeutic drug level monitoring: Secondary | ICD-10-CM

## 2013-10-20 DIAGNOSIS — Z7901 Long term (current) use of anticoagulants: Secondary | ICD-10-CM

## 2013-10-20 LAB — POCT INR: INR: 2.7

## 2013-11-01 ENCOUNTER — Other Ambulatory Visit: Payer: Self-pay | Admitting: Physician Assistant

## 2013-11-06 ENCOUNTER — Other Ambulatory Visit: Payer: Self-pay | Admitting: Physician Assistant

## 2013-11-07 ENCOUNTER — Encounter: Payer: Self-pay | Admitting: Cardiovascular Disease

## 2013-11-07 ENCOUNTER — Ambulatory Visit (INDEPENDENT_AMBULATORY_CARE_PROVIDER_SITE_OTHER): Payer: Medicare Other | Admitting: Cardiovascular Disease

## 2013-11-07 VITALS — BP 167/60 | HR 57 | Ht 64.0 in | Wt 127.0 lb

## 2013-11-07 DIAGNOSIS — I701 Atherosclerosis of renal artery: Secondary | ICD-10-CM

## 2013-11-07 DIAGNOSIS — I10 Essential (primary) hypertension: Secondary | ICD-10-CM

## 2013-11-07 DIAGNOSIS — I5032 Chronic diastolic (congestive) heart failure: Secondary | ICD-10-CM

## 2013-11-07 DIAGNOSIS — Z7901 Long term (current) use of anticoagulants: Secondary | ICD-10-CM

## 2013-11-07 DIAGNOSIS — I4891 Unspecified atrial fibrillation: Secondary | ICD-10-CM

## 2013-11-07 DIAGNOSIS — R609 Edema, unspecified: Secondary | ICD-10-CM

## 2013-11-07 DIAGNOSIS — Z5181 Encounter for therapeutic drug level monitoring: Secondary | ICD-10-CM

## 2013-11-07 DIAGNOSIS — Z79899 Other long term (current) drug therapy: Secondary | ICD-10-CM

## 2013-11-07 NOTE — Progress Notes (Signed)
Patient ID: TANISHI LAMBO, female   DOB: 1929/04/12, 80 y.o.   MRN: [id number]      SUBJECTIVE: Mrs. Grissett is an 80 yo female with a history of HTN, atrial fibrillation on chronic coumadin therapy, renal dysfunction, PAD, right renal artery stenosis (1-59% in 04/2013), and chronic diastolic heart failure.  Echocardiography done on 02-01-13 showed normal LV systolic function, EF 60-65%, with mild to perhaps moderate mitral regurgitation, with no evidence of stenosis.  She uses knee-high compression stockings to attenuate leg swelling, and has not taken Lasix for 3-4 months. Weight 127 lbs today, and 127 lbs on 2/12.  She has had more ankle and leg swelling over the past month, but says it is less so today. She denies chest pain, shortness of breath, orthopnea, palpitations, and PND.   No Known Allergies  Current Outpatient Prescriptions  Medication Sig Dispense Refill  . ALPRAZolam (XANAX) 0.5 MG tablet Take 0.25 mg by mouth at bedtime.      Marland Kitchen amiodarone (PACERONE) 200 MG tablet Take 0.5 tablets (100 mg total) by mouth every evening.      Marland Kitchen amLODipine (NORVASC) 10 MG tablet TAKE 1 TABLET BY MOUTH EVERY DAY  30 tablet  6  . Biotin 5000 MCG TABS Take 1 tablet by mouth daily.       . Calcium Carbonate-Vitamin D (CALTRATE 600+D PO) Take 1 tablet by mouth 2 (two) times daily.       . carvedilol (COREG) 6.25 MG tablet Take 0.5 tablets (3.125 mg total) by mouth 2 (two) times daily.      Marland Kitchen donepezil (ARICEPT) 5 MG tablet Take 5 mg by mouth at bedtime.       Marland Kitchen lisinopril (PRINIVIL,ZESTRIL) 10 MG tablet TAKE 1 TABLET BY MOUTH TWICE DAILY  60 tablet  6  . lovastatin (MEVACOR) 20 MG tablet Take 20 mg by mouth at bedtime.        Marland Kitchen omeprazole (PRILOSEC) 40 MG capsule Take 1 capsule by mouth Daily.      . prazosin (MINIPRESS) 2 MG capsule Take 2 mg by mouth 2 (two) times daily.      Marland Kitchen warfarin (COUMADIN) 2 MG tablet TAKE 1 TABLET BY MOUTH ONCE DAILY  90 tablet  3   No current facility-administered  medications for this visit.    Past Medical History  Diagnosis Date  . Sinoatrial node dysfunction      SINUS BRADYCARDIA  . Hypertension     unspecified  . Atrial fibrillation   . Encounter for long-term (current) use of other medications     COUMADIN THERAPY  . Lower extremity weakness   . Bilateral renal artery stenosis     MODERATE by dopplers August 2012.  . Sinus bradycardia   . Chronic renal insufficiency     Past Surgical History  Procedure Laterality Date  . Knee arthroscopy    . Cardioversion      History   Social History  . Marital Status: Married    Spouse Name: [name]    Number of Children: 0  . Years of Education: 12   Occupational History  . RETIRED   .     Social History Main Topics  . Smoking status: Never Smoker   . Smokeless tobacco: Never Used  . Alcohol Use: No  . Drug Use: No  . Sexual Activity: Not on file   Other Topics Concern  . Not on file   Social History Narrative   Patient lives at home with  her husband [name] Patient is retired. Patient has 12 th grade education.   Right handed.   Caffeine- two three cups of coffee daily.     Filed Vitals:   11/07/13 1529  Height: 5\' 4"  (1.626 m)  Weight: 127 lb (57.607 kg)   BP 167/60  Pulse 57   PHYSICAL EXAM General: NAD Neck: No JVD, no thyromegaly. Lungs: Clear to auscultation bilaterally with normal respiratory effort. CV: Nondisplaced PMI.  Regular rate and rhythm, normal S1/S2, no S3/S4, II/VI pansystolic murmur along left sternal border. Trace pretibial and periankle edema b/l.  No carotid bruit.   Abdomen: Soft, nontender, no hepatosplenomegaly, no distention.  Neurologic: Alert and oriented x 3.  Psych: Normal affect. Extremities: No clubbing or cyanosis.   ECG: reviewed and available in electronic records.      ASSESSMENT AND PLAN:  HYPERTENSION Elevated today. She has unilateral renal artery stenosis. I have asked the patient to check blood pressure readings  3-4 times per week for the next few weeks, at different times throughout the day, in order to get a better approximation of mean BP values. These results will be provided to me at the end of that period so that I can determine if antihypertensive medication titration is indicated. If BP remains persistently elevated, I will increase lisinopril and check a BMET afterwards.  Peripheral edema  She has had exacerbation of chronic kidney disease in the past, and has right renal artery stenosis. Weight is stable at 127 lbs. I will aim to optimize BP control. I have elected to not resume diuretic treatment for her chronic peripheral edema. Moreover, she has documented evidence of NL LVF and RVF by echocardiography. Therefore, I recommend conservative management employing continued use of compression hose, knee-high 20-30 mmHg, and elevating her legs whenever possible.    Chronic renal insufficiency/Right renal artery stenosis Continue to monitor. Repeat renal Dopplers in 6 months.  SINUS BRADYCARDIA  She is currently asymptomatic. She is on Coreg and amiodarone at present, and I had previously reduced Coreg to 3.125 mg bid (HR 57 bpm today). Continue to monitor.   Atrial fibrillation  Maintaining sinus bradycardia on low-dose amiodarone. On Coumadin for anticoagulation, followed here in our clinic. I will check TFT's and LFT's due to amiodarone use.  Chronic diastolic heart failure  Again, I do not recommend resuming diuretic therapy, for reasons as outlined above. Compression stockings as stated above. Aim to control BP.  Gait instability  I previously made a referral to neurology, and she is now being managed by Dr. Terrace ArabiaYan.   Dispo: f/u 6 months.   Prentice DockerSuresh Olina Melfi, M.D., F.A.C.C.

## 2013-11-07 NOTE — Patient Instructions (Signed)
Labs for TSH, freeT4, liver function - orders provided today Office will contact with results via phone or letter.   Continue all current medications. Please keep a log of your blood pressure readings 3-4 x per week over the next 2-3 weeks.  Return to office for MD review. Your physician wants you to follow up in: 6 months.  You will receive a reminder letter in the mail one-two months in advance.  If you don't receive a letter, please call our office to schedule the follow up appointment

## 2013-11-10 ENCOUNTER — Ambulatory Visit (INDEPENDENT_AMBULATORY_CARE_PROVIDER_SITE_OTHER): Payer: Medicare Other | Admitting: *Deleted

## 2013-11-10 DIAGNOSIS — I4891 Unspecified atrial fibrillation: Secondary | ICD-10-CM

## 2013-11-10 DIAGNOSIS — Z7901 Long term (current) use of anticoagulants: Secondary | ICD-10-CM

## 2013-11-10 DIAGNOSIS — Z5181 Encounter for therapeutic drug level monitoring: Secondary | ICD-10-CM

## 2013-11-10 LAB — POCT INR: INR: 2.3

## 2013-11-14 ENCOUNTER — Telehealth: Payer: Self-pay | Admitting: *Deleted

## 2013-11-14 NOTE — Telephone Encounter (Signed)
Notes Recorded by Lesle Chris, LPN on 09/13/5359 at 5:03 PM Left message to return call.

## 2013-11-14 NOTE — Telephone Encounter (Signed)
Message copied by Lesle Chris on Tue Nov 14, 2013  5:03 PM ------      Message from: Prentice Docker A      Created: Thu Nov 09, 2013 10:45 AM       TSH minimally elevated, free T4 elevated, normal LFT's. D/c amiodarone. ------

## 2013-11-15 NOTE — Telephone Encounter (Signed)
Returned your call.

## 2013-11-15 NOTE — Telephone Encounter (Signed)
Notes Recorded by Lesle Chris, LPN on 12/15/8500 at 3:42 PM Patient notified and verbalized understanding.

## 2013-12-08 ENCOUNTER — Ambulatory Visit (INDEPENDENT_AMBULATORY_CARE_PROVIDER_SITE_OTHER): Payer: Medicare Other | Admitting: *Deleted

## 2013-12-08 DIAGNOSIS — Z7901 Long term (current) use of anticoagulants: Secondary | ICD-10-CM

## 2013-12-08 DIAGNOSIS — I4891 Unspecified atrial fibrillation: Secondary | ICD-10-CM

## 2013-12-08 DIAGNOSIS — Z5181 Encounter for therapeutic drug level monitoring: Secondary | ICD-10-CM

## 2013-12-08 LAB — POCT INR: INR: 2.2

## 2013-12-21 ENCOUNTER — Telehealth: Payer: Self-pay | Admitting: *Deleted

## 2013-12-21 NOTE — Telephone Encounter (Signed)
See BP log scanned into EPIC.  Readings okay per Dr. Purvis Sheffield.  Patient aware.

## 2013-12-29 ENCOUNTER — Telehealth: Payer: Self-pay | Admitting: Cardiovascular Disease

## 2013-12-29 MED ORDER — FUROSEMIDE 20 MG PO TABS: ORAL_TABLET | ORAL | Status: DC

## 2013-12-29 MED ORDER — POTASSIUM CHLORIDE ER 10 MEQ PO TBCR: EXTENDED_RELEASE_TABLET | ORAL | Status: DC

## 2013-12-29 NOTE — Telephone Encounter (Signed)
Leaving at 225   Called to complain of feet swollen

## 2013-12-29 NOTE — Telephone Encounter (Signed)
[name] called back to see if anyone has called her back cause she left her phone outside.

## 2013-12-29 NOTE — Telephone Encounter (Signed)
Can take Lasix 20 mg daily for 3 days with KCl 10 meq daily for 3 days as well.

## 2013-12-29 NOTE — Telephone Encounter (Signed)
Feet swelling off/on, but seems to be more over the last 3-4 days.  No c/o chest pain, SOB, or dizziness.  Stated that she has been going to physical therapy on back at [facility].  Today is her second visit .  Advised patient that message will be sent to provider for advice.

## 2013-12-29 NOTE — Telephone Encounter (Signed)
Patient notified.  New rx sent to pharm.  Advised to call back if no improvement.  Patient verbalized understanding.

## 2014-01-05 ENCOUNTER — Ambulatory Visit (INDEPENDENT_AMBULATORY_CARE_PROVIDER_SITE_OTHER): Payer: Medicare Other | Admitting: *Deleted

## 2014-01-05 DIAGNOSIS — I4891 Unspecified atrial fibrillation: Secondary | ICD-10-CM

## 2014-01-05 DIAGNOSIS — Z7901 Long term (current) use of anticoagulants: Secondary | ICD-10-CM

## 2014-01-05 DIAGNOSIS — Z5181 Encounter for therapeutic drug level monitoring: Secondary | ICD-10-CM

## 2014-01-05 LAB — POCT INR: INR: 3

## 2014-01-19 ENCOUNTER — Encounter: Payer: Self-pay | Admitting: Cardiovascular Disease

## 2014-01-19 ENCOUNTER — Ambulatory Visit (INDEPENDENT_AMBULATORY_CARE_PROVIDER_SITE_OTHER): Payer: Medicare Other | Admitting: Cardiovascular Disease

## 2014-01-19 VITALS — BP 113/74 | HR 57 | Ht 64.0 in | Wt 137.0 lb

## 2014-01-19 DIAGNOSIS — N184 Chronic kidney disease, stage 4 (severe): Secondary | ICD-10-CM

## 2014-01-19 DIAGNOSIS — R609 Edema, unspecified: Secondary | ICD-10-CM

## 2014-01-19 DIAGNOSIS — K529 Noninfective gastroenteritis and colitis, unspecified: Secondary | ICD-10-CM

## 2014-01-19 DIAGNOSIS — I5033 Acute on chronic diastolic (congestive) heart failure: Secondary | ICD-10-CM

## 2014-01-19 DIAGNOSIS — I4891 Unspecified atrial fibrillation: Secondary | ICD-10-CM

## 2014-01-19 DIAGNOSIS — I701 Atherosclerosis of renal artery: Secondary | ICD-10-CM

## 2014-01-19 DIAGNOSIS — I10 Essential (primary) hypertension: Secondary | ICD-10-CM

## 2014-01-19 DIAGNOSIS — Z7901 Long term (current) use of anticoagulants: Secondary | ICD-10-CM

## 2014-01-19 DIAGNOSIS — R600 Localized edema: Secondary | ICD-10-CM

## 2014-01-19 DIAGNOSIS — R2681 Unsteadiness on feet: Secondary | ICD-10-CM

## 2014-01-19 DIAGNOSIS — R197 Diarrhea, unspecified: Secondary | ICD-10-CM

## 2014-01-19 DIAGNOSIS — R269 Unspecified abnormalities of gait and mobility: Secondary | ICD-10-CM

## 2014-01-19 MED ORDER — POTASSIUM CHLORIDE ER 10 MEQ PO TBCR: 10.0000 meq | EXTENDED_RELEASE_TABLET | Freq: Every day | ORAL | Status: DC

## 2014-01-19 MED ORDER — FUROSEMIDE 20 MG PO TABS: 20.0000 mg | ORAL_TABLET | Freq: Every day | ORAL | Status: DC

## 2014-01-19 NOTE — Progress Notes (Signed)
Patient ID: [name], female   DOB: 04/14/1929, 80 y.o.   MRN: [id number]      SUBJECTIVE: [name] is an 80 yo female with a history of HTN, atrial fibrillation on chronic coumadin therapy, renal dysfunction, PAD, right renal artery stenosis (1-59% in 04/2013), and chronic diastolic heart failure.  Echocardiography done on 02-01-13 showed normal LV systolic function, EF 60-65%, with mild to perhaps moderate mitral regurgitation, with no evidence of stenosis.   She has been having more problems with leg and ankle swelling lately, for which I prescribed Lasix 20 mg daily for 3 days with potassium supplementation. [name] then had her take 40 mg twice a week. Unfortunately, she has not noticed much improvement. She seldom gets short of breath. She also tells me she has been having diarrhea almost on a daily basis for the past 3-4 months. She denies chest pain. She requests a new pair of knee-high compression stockings.  Wt 137 lbs today, had been 127 lbs at last visit in March.     No Known Allergies  Current Outpatient Prescriptions  Medication Sig Dispense Refill  . ALPRAZolam (XANAX) 0.5 MG tablet Take 0.25 mg by mouth at bedtime as needed for anxiety.      Marland Kitchen. amLODipine (NORVASC) 10 MG tablet TAKE 1 TABLET BY MOUTH EVERY DAY  30 tablet  6  . Biotin 5000 MCG TABS Take 1 tablet by mouth daily.       . Calcium Carbonate-Vitamin D (CALTRATE 600+D PO) Take 1 tablet by mouth 2 (two) times daily.       . carvedilol (COREG) 6.25 MG tablet Take 0.5 tablets (3.125 mg total) by mouth 2 (two) times daily.      . Cyanocobalamin 1000 MCG/15ML LIQD Take 15 mLs by mouth 2 (two) times a week.      . donepezil (ARICEPT) 5 MG tablet Take 5 mg by mouth at bedtime.       . furosemide (LASIX) 40 MG tablet Take 40 mg by mouth 2 (two) times a week.      Marland Kitchen. HYDROcodone-acetaminophen (NORCO) 7.5-325 MG per tablet Take 1 tablet by mouth every 8 (eight) hours as needed for moderate pain.      Marland Kitchen.  lisinopril (PRINIVIL,ZESTRIL) 10 MG tablet TAKE 1 TABLET BY MOUTH TWICE DAILY  60 tablet  6  . lovastatin (MEVACOR) 20 MG tablet Take 20 mg by mouth at bedtime.        . Multiple Vitamin (MULTIVITAMIN) tablet Take 1 tablet by mouth daily.      . potassium chloride (K-DUR) 10 MEQ tablet Take 10 mEq by mouth as needed.      . prazosin (MINIPRESS) 2 MG capsule Take 2 mg by mouth 2 (two) times daily.      Marland Kitchen. warfarin (COUMADIN) 2 MG tablet TAKE 1 TABLET BY MOUTH ONCE DAILY  90 tablet  3   No current facility-administered medications for this visit.    Past Medical History  Diagnosis Date  . Sinoatrial node dysfunction      SINUS BRADYCARDIA  . Hypertension     unspecified  . Atrial fibrillation   . Encounter for long-term (current) use of other medications     COUMADIN THERAPY  . Lower extremity weakness   . Bilateral renal artery stenosis     MODERATE by dopplers August 2012.  . Sinus bradycardia   . Chronic renal insufficiency     Past Surgical History  Procedure Laterality Date  . Knee  arthroscopy    . Cardioversion      History   Social History  . Marital Status: Married    Spouse Name: [name]    Number of Children: 0  . Years of Education: 12   Occupational History  . RETIRED   .     Social History Main Topics  . Smoking status: Never Smoker   . Smokeless tobacco: Never Used  . Alcohol Use: No  . Drug Use: No  . Sexual Activity: Not on file   Other Topics Concern  . Not on file   Social History Narrative   Patient lives at home with her husband [name]) Patient is retired. Patient has 12 th grade education.   Right handed.   Caffeine- two three cups of coffee daily.     Filed Vitals:   01/19/14 0939  BP: 113/74  Pulse: 57  Height: 5\' 4"  (1.626 m)  Weight: 137 lb (62.143 kg)  SpO2: 96%    PHYSICAL EXAM General: NAD  Neck: No JVD, no thyromegaly.  Lungs: Clear to auscultation bilaterally with normal respiratory effort.  CV: Nondisplaced PMI.  Irregular rhythm, normal rate, normal S1/S2, no S3, II/VI pansystolic murmur along left sternal border. 1+ pretibial and periankle edema b/l.  Abdomen: Soft, nontender, no hepatosplenomegaly, no distention.  Neurologic: Alert and oriented x 3.  Psych: Normal affect.  Extremities: No clubbing or cyanosis.    ECG: reviewed and available in electronic records.      ASSESSMENT AND PLAN:  HYPERTENSION  Normal today. She has unilateral renal artery stenosis.  Peripheral edema  She has had exacerbation of chronic kidney disease in the past, and has right renal artery stenosis. Weight is increased at 137 lbs. I will start Lasix 40 mg daily for the next 7 days, followed by 20 mg daily. I will check a basic metabolic panel next week. She has been instructed to take potassium supplementation along with her Lasix. I will prescribe her a new pair of knee-high compression stockings, 20-30 mm mercury.  Chronic renal insufficiency/Right renal artery stenosis  BMET next week given diuretic use. Repeat renal Dopplers in 3 months.   SINUS BRADYCARDIA  She is in atrial fibrillation. She is on Coreg, and I had previously reduced it to 3.125 mg bid (HR 57 bpm today). Continue to monitor.  Resting HR in 80 bpm range today. If it increases, I would consider short-acting diltiazem.  Atrial fibrillation  Resting HR in 80 bpm range on low-dose Coreg, and back in atrial fibrillation. On Coumadin for anticoagulation, followed here in our clinic. Given abnormal TFT's, I have since discontinued amiodarone. Will initiate diltiazem if HR increases in future.  Acute on chronic diastolic heart failure  Weight is increased at 137 lbs. I will start Lasix 40 mg daily for the next 7 days, followed by 20 mg daily. I will check a basic metabolic panel next week. She has been instructed to take potassium supplementation along with her Lasix. I will prescribe her a new pair of knee-high compression stockings, 20-30 mm  mercury.  Gait instability  I previously made a referral to neurology, and she is now being managed by Dr. Terrace Arabia.   Chronic diarrhea Will make a referral to GI.  Dispo: f/u 3-4 weeks.  Prentice Docker, M.D., F.A.C.C.

## 2014-01-19 NOTE — Addendum Note (Signed)
Addended by: Lesle Chris on: 01/19/2014 10:26 AM   Modules accepted: Orders

## 2014-01-19 NOTE — Patient Instructions (Signed)
   Lasix 40mg  daily x 1 week, then decrease to 20mg  daily thereafter - new sent to pharm  Change Potassium to daily - new sent to pharm Continue all other medications.   Lab for BMET - due middle of next week Office will contact with results via phone or letter.   Referral to GI for diarrhea Knee high compression stockings - order given today Follow up in  3-4 weeks

## 2014-01-23 ENCOUNTER — Telehealth: Payer: Self-pay | Admitting: Cardiovascular Disease

## 2014-01-23 NOTE — Telephone Encounter (Signed)
Patient walked into clinic and wanted to know what medication is she suppose to start that is a liquid?

## 2014-01-24 NOTE — Telephone Encounter (Signed)
Discussed below with patient.  She was questioning the liquid B12 that was on her medication list.  Patient stated that she has not been taking anything like this.  Informed patient that med was added to list at her last OV.  Would have been added from a list patient may have provided or patient / family member telling us in the office.  Asked her to confirm with her PMD if he has her on this or not as this is not something we would prescribe for her.  Patient verbalized understanding.

## 2014-01-24 NOTE — Telephone Encounter (Signed)
Left message to return call 

## 2014-01-31 ENCOUNTER — Telehealth: Payer: Self-pay | Admitting: *Deleted

## 2014-01-31 ENCOUNTER — Encounter: Payer: Self-pay | Admitting: Internal Medicine

## 2014-01-31 NOTE — Telephone Encounter (Signed)
Notes Recorded by Lesle Chris, LPN on 5/70/1779 at 10:00 AM Left message to return call.  Notes Recorded by Laqueta Linden, MD on 01/29/2014 at 10:38 AM Agree with Dr. Verna Czech assessment. No change to current plan.  Notes Recorded by Antoine Poche, MD on 01/26/2014 at 4:48 PM Labs show renal function is not normal but is improved from last labs check

## 2014-02-02 ENCOUNTER — Ambulatory Visit (INDEPENDENT_AMBULATORY_CARE_PROVIDER_SITE_OTHER): Payer: Medicare Other | Admitting: *Deleted

## 2014-02-02 DIAGNOSIS — Z5181 Encounter for therapeutic drug level monitoring: Secondary | ICD-10-CM

## 2014-02-02 DIAGNOSIS — I4891 Unspecified atrial fibrillation: Secondary | ICD-10-CM

## 2014-02-02 DIAGNOSIS — Z7901 Long term (current) use of anticoagulants: Secondary | ICD-10-CM

## 2014-02-02 LAB — POCT INR: INR: 2.6

## 2014-02-02 NOTE — Telephone Encounter (Signed)
Patient notified

## 2014-02-21 ENCOUNTER — Ambulatory Visit (INDEPENDENT_AMBULATORY_CARE_PROVIDER_SITE_OTHER): Payer: Medicare Other | Admitting: Cardiovascular Disease

## 2014-02-21 ENCOUNTER — Encounter: Payer: Self-pay | Admitting: Cardiovascular Disease

## 2014-02-21 VITALS — BP 101/64 | HR 109 | Ht 63.0 in | Wt 140.0 lb

## 2014-02-21 DIAGNOSIS — R2689 Other abnormalities of gait and mobility: Secondary | ICD-10-CM

## 2014-02-21 DIAGNOSIS — Z5181 Encounter for therapeutic drug level monitoring: Secondary | ICD-10-CM

## 2014-02-21 DIAGNOSIS — R609 Edema, unspecified: Secondary | ICD-10-CM

## 2014-02-21 DIAGNOSIS — I5033 Acute on chronic diastolic (congestive) heart failure: Secondary | ICD-10-CM

## 2014-02-21 DIAGNOSIS — R269 Unspecified abnormalities of gait and mobility: Secondary | ICD-10-CM

## 2014-02-21 DIAGNOSIS — I10 Essential (primary) hypertension: Secondary | ICD-10-CM

## 2014-02-21 DIAGNOSIS — I4891 Unspecified atrial fibrillation: Secondary | ICD-10-CM

## 2014-02-21 DIAGNOSIS — I701 Atherosclerosis of renal artery: Secondary | ICD-10-CM

## 2014-02-21 DIAGNOSIS — N184 Chronic kidney disease, stage 4 (severe): Secondary | ICD-10-CM

## 2014-02-21 MED ORDER — DILTIAZEM HCL 30 MG PO TABS: 30.0000 mg | ORAL_TABLET | Freq: Two times a day (BID) | ORAL | Status: DC

## 2014-02-21 NOTE — Patient Instructions (Addendum)
   Stop Lovastatin (Mevacor)  Stop Prazosin (Minipress)  Begin Diltiazem 30mg  twice a day  - new sent to pharm Continue all other medications.   Follow up in  3 weeks

## 2014-02-21 NOTE — Progress Notes (Signed)
Patient ID: [name], female   DOB: 08/19/1928, 80 y.o.   MRN: [id number]      SUBJECTIVE: [name] is an 80 yo female with a history of HTN, atrial fibrillation on chronic coumadin therapy, renal dysfunction, PAD, right renal artery stenosis (1-59% in 04/2013), and chronic diastolic heart failure.  Echocardiography done on 02-01-13 showed normal LV systolic function, EF 60-65%, with mild to perhaps moderate mitral regurgitation, with no evidence of stenosis.   Wt today 140 lbs (137 at last visit).  She has been feeling fatigued. She does not feel like the increased dose of Lasix nor the compression stockings alleviated her leg swelling. Her heart rate is 109 beats per minute and her blood pressure is 101/64. She denies chest pain, shortness of breath and palpitations. She said her mammogram was abnormal. She has been having problems with balance and walking. She would like to stop some of her medications to see if this helps.    No Known Allergies  Current Outpatient Prescriptions  Medication Sig Dispense Refill  . ALPRAZolam (XANAX) 0.5 MG tablet Take 0.25 mg by mouth at bedtime as needed for anxiety.      Marland Kitchen. amLODipine (NORVASC) 10 MG tablet TAKE 1 TABLET BY MOUTH EVERY DAY  30 tablet  6  . Biotin 5000 MCG TABS Take 1 tablet by mouth daily.       . Calcium Carbonate-Vitamin D (CALTRATE 600+D PO) Take 1 tablet by mouth 2 (two) times daily.       . carvedilol (COREG) 6.25 MG tablet Take 0.5 tablets (3.125 mg total) by mouth 2 (two) times daily.      . Cyanocobalamin (VITAMIN B 12 PO) Take by mouth daily.      Marland Kitchen. donepezil (ARICEPT) 5 MG tablet Take 5 mg by mouth at bedtime.       . furosemide (LASIX) 20 MG tablet Take 1 tablet (20 mg total) by mouth daily.  30 tablet  6  . HYDROcodone-acetaminophen (NORCO) 7.5-325 MG per tablet Take 1 tablet by mouth every 8 (eight) hours as needed for moderate pain.      Marland Kitchen. lisinopril (PRINIVIL,ZESTRIL) 10 MG tablet TAKE 1 TABLET BY MOUTH TWICE  DAILY  60 tablet  6  . lovastatin (MEVACOR) 20 MG tablet Take 20 mg by mouth at bedtime.        . Multiple Vitamin (MULTIVITAMIN) tablet Take 1 tablet by mouth daily.      . potassium chloride (K-DUR) 10 MEQ tablet Take 1 tablet (10 mEq total) by mouth daily.  30 tablet  6  . prazosin (MINIPRESS) 2 MG capsule Take 2 mg by mouth at bedtime.       Marland Kitchen. warfarin (COUMADIN) 2 MG tablet TAKE 1 TABLET BY MOUTH ONCE DAILY  90 tablet  3   No current facility-administered medications for this visit.    Past Medical History  Diagnosis Date  . Sinoatrial node dysfunction      SINUS BRADYCARDIA  . Hypertension     unspecified  . Atrial fibrillation   . Encounter for long-term (current) use of other medications     COUMADIN THERAPY  . Lower extremity weakness   . Bilateral renal artery stenosis     MODERATE by dopplers August 2012.  . Sinus bradycardia   . Chronic renal insufficiency     Past Surgical History  Procedure Laterality Date  . Knee arthroscopy    . Cardioversion      History   Social History  .  Marital Status: Married    Spouse Name: [name]    Number of Children: 0  . Years of Education: 12   Occupational History  . RETIRED   .     Social History Main Topics  . Smoking status: Never Smoker   . Smokeless tobacco: Never Used  . Alcohol Use: No  . Drug Use: No  . Sexual Activity: Not on file   Other Topics Concern  . Not on file   Social History Narrative   Patient lives at home with her husband [name]) Patient is retired. Patient has 12 th grade education.   Right handed.   Caffeine- two three cups of coffee daily.     Filed Vitals:   02/21/14 1313  BP: 101/64  Pulse: 109  Height: 5\' 3"  (1.6 m)  Weight: 140 lb (63.504 kg)    PHYSICAL EXAM General: NAD  Neck: No JVD, no thyromegaly.  Lungs: Clear to auscultation bilaterally with normal respiratory effort.  CV: Nondisplaced PMI. Irregular rhythm, tachycardic, normal S1/S2, no S3, II/VI pansystolic  murmur along left sternal border. 1+ pretibial and periankle edema b/l.  Abdomen: Soft, nontender, no hepatosplenomegaly, no distention.  Neurologic: Alert and oriented x 3.  Psych: Normal affect.  Extremities: No clubbing or cyanosis.    ECG: reviewed and available in electronic records.      ASSESSMENT AND PLAN:  HYPERTENSION  Low normal today. She has unilateral renal artery stenosis. Will d/c prazosin and closely monitor given her propensity for high BP.  Peripheral edema  She has had exacerbation of chronic kidney disease in the past, and has right renal artery stenosis. Weight is increased at 140 lbs.  I will continue Lasix 20 mg daily. She has been instructed to take potassium supplementation along with her Lasix. She will continue to wear knee-high compression stockings,  20-30 mm mercury.  I will attempt to control HR to see if this improves leg swelling, as this may be due to diastolic decompensation given HR 109 bpm.  Chronic renal insufficiency/Right renal artery stenosis  Repeat renal Dopplers in 2 months.   SINUS BRADYCARDIA  She is in atrial fibrillation. She is on Coreg, and I had previously reduced it to 3.125 mg bid (HR 109 bpm today). EF 60-65% in 01/2013. I will start short-acting diltiazem 30 mg bid.  Atrial fibrillation  She is in atrial fibrillation. She is on Coreg, and I had previously reduced it to 3.125 mg bid (HR 109 bpm today). EF 60-65% in 01/2013. I will start short-acting diltiazem 30 mg bid.. On Coumadin for anticoagulation, followed here in our clinic. Given abnormal TFT's, I have since discontinued amiodarone.   Acute on chronic diastolic heart failure  Weight is increased at 140 lbs.  I will make the aforementioned changes by adding diltiazem to control HR. Continue Lasix 20 mg daily and compression stockings.  Gait instability  I previously made a referral to neurology, and she is now being managed by Dr. Terrace Conway.   Chronic diarrhea  Will  previously made a referral to GI.   Dispo: f/u 3-4 weeks. Will also d/c lovastatin in an effort to decrease meds as per pt and husband's request.   Karen Docker, M.D., F.A.C.C.

## 2014-03-05 ENCOUNTER — Encounter: Payer: Self-pay | Admitting: Gastroenterology

## 2014-03-05 ENCOUNTER — Ambulatory Visit (INDEPENDENT_AMBULATORY_CARE_PROVIDER_SITE_OTHER): Payer: Medicare Other | Admitting: Gastroenterology

## 2014-03-05 VITALS — BP 126/73 | HR 101 | Temp 96.6°F | Ht 63.0 in | Wt 142.4 lb

## 2014-03-05 DIAGNOSIS — R197 Diarrhea, unspecified: Secondary | ICD-10-CM

## 2014-03-05 DIAGNOSIS — K529 Noninfective gastroenteritis and colitis, unspecified: Secondary | ICD-10-CM

## 2014-03-05 DIAGNOSIS — I701 Atherosclerosis of renal artery: Secondary | ICD-10-CM

## 2014-03-05 NOTE — Assessment & Plan Note (Signed)
80 y/o female with 6 month h/o chronic diarrhea. Reports unremarkable colonoscopy a couple years ago. No obvious medication changes to explain symptoms. No recent antibiotic use. No travel. Differential diagnosis includes microscopic colitis, high on the list. We will check stool studies, CBC, I FOBT. Low likelihood of mesenteric ischemia without abdominal pain or weight loss. If initial work up negative, she may require repeat colonoscopy with random colon biopsies. Recommended she try imodium 2mg  TID prn for now. Further recommendations to follow.

## 2014-03-05 NOTE — Progress Notes (Signed)
Primary Care Physician:  Ignatius Specking., MD  Primary Gastroenterologist:  Roetta Sessions, MD   Chief Complaint  Patient presents with  . Diarrhea    for several months    HPI:  [name] is a 80 y.o. female here at the request of Dr. Purvis Sheffield for further evaluation of six months of diarrhea. Prior to this one daily BM. Now all stools loose, last all morning and then tapers off. No BM if does not eat. No nocturnal symptoms. No abdominal pain, cramps. No melena, brbpr. Weight up about 10 pounds in the last year. No recent antiobiotics. No travel abroad. Last colonoscopy about two years ago, Dr. Allena Katz or Samuella Cota. Just started trying some imodium. Recent TSH slightly elevated. States her medications are switched around all the time.   Current Outpatient Prescriptions  Medication Sig Dispense Refill  . ALPRAZolam (XANAX) 0.5 MG tablet Take 0.25 mg by mouth at bedtime as needed for anxiety.      Marland Kitchen amLODipine (NORVASC) 10 MG tablet TAKE 1 TABLET BY MOUTH EVERY DAY  30 tablet  6  . Biotin 5000 MCG TABS Take 1 tablet by mouth daily.       . Calcium Carbonate-Vitamin D (CALTRATE 600+D PO) Take 1 tablet by mouth 2 (two) times daily.       . carvedilol (COREG) 6.25 MG tablet Take 0.5 tablets (3.125 mg total) by mouth 2 (two) times daily.      . Cyanocobalamin (VITAMIN B 12 PO) Take by mouth daily.      Marland Kitchen diltiazem (CARDIZEM) 30 MG tablet Take 1 tablet (30 mg total) by mouth 2 (two) times daily.  60 tablet  6  . donepezil (ARICEPT) 5 MG tablet Take 5 mg by mouth at bedtime.       . furosemide (LASIX) 20 MG tablet Take 1 tablet (20 mg total) by mouth daily.  30 tablet  6  . HYDROcodone-acetaminophen (NORCO) 7.5-325 MG per tablet Take 1 tablet by mouth every 8 (eight) hours as needed for moderate pain.      Marland Kitchen lisinopril (PRINIVIL,ZESTRIL) 10 MG tablet TAKE 1 TABLET BY MOUTH TWICE DAILY  60 tablet  6  . Multiple Vitamin (MULTIVITAMIN) tablet Take 1 tablet by mouth daily.      . potassium chloride  (K-DUR) 10 MEQ tablet Take 1 tablet (10 mEq total) by mouth daily.  30 tablet  6  . warfarin (COUMADIN) 2 MG tablet TAKE 1 TABLET BY MOUTH ONCE DAILY  90 tablet  3   No current facility-administered medications for this visit.    Allergies as of 03/05/2014  . (No Known Allergies)    Past Medical History  Diagnosis Date  . Sinoatrial node dysfunction      SINUS BRADYCARDIA  . Hypertension     unspecified  . Atrial fibrillation   . Encounter for long-term (current) use of other medications     COUMADIN THERAPY  . Lower extremity weakness   . Bilateral renal artery stenosis     MODERATE by dopplers August 2012.  . Sinus bradycardia   . Chronic renal insufficiency     Past Surgical History  Procedure Laterality Date  . Knee arthroscopy    . Cardioversion      Family History  Problem Relation Age of Onset  . Cancer Brother   . Heart failure Mother   . Cirrhosis Other   . High blood pressure Mother     History   Social History  . Marital Status:  Married    Spouse Name: [name]    Number of Children: 0  . Years of Education: 12   Occupational History  . RETIRED   .     Social History Main Topics  . Smoking status: Never Smoker   . Smokeless tobacco: Never Used     Comment: Never smoked  . Alcohol Use: No  . Drug Use: No  . Sexual Activity: Not on file   Other Topics Concern  . Not on file   Social History Narrative   Patient lives at home with her husband [name] Patient is retired. Patient has 12 th grade education.   Right handed.   Caffeine- two three cups of coffee daily.      ROS:  General: Negative for anorexia, weight loss, fever, chills, fatigue, weakness. Eyes: Negative for vision changes.  ENT: Negative for hoarseness, difficulty swallowing , nasal congestion. CV: Negative for chest pain, angina, palpitations, dyspnea on exertion, peripheral edema.  Respiratory: Negative for dyspnea at rest, dyspnea on exertion, cough, sputum, wheezing.   GI: See history of present illness. GU:  Negative for dysuria, hematuria, urinary incontinence, urinary frequency, nocturnal urination.  MS: Negative for joint pain, low back pain.  Derm: Negative for rash or itching.  Neuro: Negative for weakness, abnormal sensation, seizure, frequent headaches, memory loss, confusion.  Psych: Negative for anxiety, depression, suicidal ideation, hallucinations.  Endo: Negative for unusual weight change.  Heme: Negative for bruising or bleeding. Allergy: Negative for rash or hives.    Physical Examination:  BP 126/73  Pulse 101  Temp(Src) 96.6 F (35.9 C) (Oral)  Ht 5\' 3"  (1.6 m)  Wt 142 lb 6.4 oz (64.592 kg)  BMI 25.23 kg/m2   General: Well-nourished, well-developed in no acute distress.  Head: Normocephalic, atraumatic.   Eyes: Conjunctiva pink, no icterus. Mouth: Oropharyngeal mucosa moist and pink , no lesions erythema or exudate. Neck: Supple without thyromegaly, masses, or lymphadenopathy.  Lungs: Clear to auscultation bilaterally.  Heart: Regular rate and rhythm, no murmurs rubs or gallops.  Abdomen: Bowel sounds are normal, nontender, nondistended, no hepatosplenomegaly or masses, no abdominal bruits or    hernia , no rebound or guarding.   Rectal: Not performed Extremities: No lower extremity edema. No clubbing or deformities.  Neuro: Alert and oriented x 4 , grossly normal neurologically.  Skin: Warm and dry, no rash or jaundice.   Psych: Alert and cooperative, normal mood and affect.  Labs: Lab Results  Component Value Date   INR 2.6 02/02/2014   INR 3.0 01/05/2014   INR 2.2 12/08/2013   11/2013:  TSH 6.55H, Free T4 2.54H LFTs normal

## 2014-03-05 NOTE — Patient Instructions (Signed)
1. Please have your labs and stool tests done. We will call you with results.  2. I will review copy of your last colonoscopy done at [facility]. 3. Take imodium 2mg  up to three times daily for diarrhea.

## 2014-03-06 ENCOUNTER — Ambulatory Visit (INDEPENDENT_AMBULATORY_CARE_PROVIDER_SITE_OTHER): Payer: Medicare Other | Admitting: *Deleted

## 2014-03-06 DIAGNOSIS — Z5181 Encounter for therapeutic drug level monitoring: Secondary | ICD-10-CM

## 2014-03-06 DIAGNOSIS — Z7901 Long term (current) use of anticoagulants: Secondary | ICD-10-CM

## 2014-03-06 DIAGNOSIS — I4891 Unspecified atrial fibrillation: Secondary | ICD-10-CM

## 2014-03-06 LAB — POCT INR: INR: 3.1

## 2014-03-06 NOTE — Progress Notes (Signed)
cc'd to pcp 

## 2014-03-08 LAB — CBC WITH DIFFERENTIAL/PLATELET
Basophils Absolute: 0.1 10*3/uL (ref 0.0–0.1)
Basophils Relative: 1 % (ref 0–1)
Eosinophils Absolute: 0.2 10*3/uL (ref 0.0–0.7)
Eosinophils Relative: 2 % (ref 0–5)
HCT: 39.1 % (ref 36.0–46.0)
Hemoglobin: 12.7 g/dL (ref 12.0–15.0)
LYMPHS ABS: 1.3 10*3/uL (ref 0.7–4.0)
LYMPHS PCT: 17 % (ref 12–46)
MCH: 25.6 pg — ABNORMAL LOW (ref 26.0–34.0)
MCHC: 32.5 g/dL (ref 30.0–36.0)
MCV: 78.8 fL (ref 78.0–100.0)
Monocytes Absolute: 0.7 10*3/uL (ref 0.1–1.0)
Monocytes Relative: 9 % (ref 3–12)
NEUTROS PCT: 71 % (ref 43–77)
Neutro Abs: 5.5 10*3/uL (ref 1.7–7.7)
PLATELETS: 271 10*3/uL (ref 150–400)
RBC: 4.96 MIL/uL (ref 3.87–5.11)
RDW: 16.6 % — ABNORMAL HIGH (ref 11.5–15.5)
WBC: 7.8 10*3/uL (ref 4.0–10.5)

## 2014-03-09 ENCOUNTER — Encounter: Payer: Self-pay | Admitting: Gastroenterology

## 2014-03-09 LAB — TISSUE TRANSGLUTAMINASE, IGA: TISSUE TRANSGLUTAMINASE AB, IGA: 5.6 U/mL (ref <20)

## 2014-03-09 LAB — IGA: IgA: 146 mg/dL (ref 69–380)

## 2014-03-09 NOTE — Progress Notes (Signed)
Quick Note:  Labs ok. Await stools. ______

## 2014-03-09 NOTE — Progress Notes (Signed)
Colonoscopy dated 05/28/2011, Dr. Allena Katz Normal colonoscopy. No biopsies taken.

## 2014-03-12 ENCOUNTER — Other Ambulatory Visit: Payer: Self-pay | Admitting: Gastroenterology

## 2014-03-15 LAB — CLOSTRIDIUM DIFFICILE BY PCR: Toxigenic C. Difficile by PCR: DETECTED — CR

## 2014-03-15 LAB — GIARDIA/CRYPTOSPORIDIUM (EIA)
Cryptosporidium Screen (EIA): NEGATIVE
Giardia Screen (EIA): NEGATIVE

## 2014-03-16 ENCOUNTER — Telehealth: Payer: Self-pay | Admitting: *Deleted

## 2014-03-16 MED ORDER — METRONIDAZOLE 500 MG PO TABS: 500.0000 mg | ORAL_TABLET | Freq: Three times a day (TID) | ORAL | Status: DC

## 2014-03-16 NOTE — Progress Notes (Addendum)
Cdiff PCR positive.  Flagyl 500 mg TID for 14 days sent to pharmacy.  DUE TO COUMADIN, will need closer monitoring of INR, as Flagyl and Coumadin together can precipitate increased INR.   Contacted Vashti Hey, RN, with [facility]. Instructions as follows:  Starting today, 1mg  Coumadin every day EXCEPT for every Monday and Thursday take 2 mg. I reviewed this with patient. She was able to repeat this back to me several times.   She will see the coumadin clinic in [place] on the 14th at 2:50.   Nira Retort, ANP-BC [facility]

## 2014-03-16 NOTE — Telephone Encounter (Signed)
Pt tested positive for C-Diff.  She is starting pt on 14 days of Flagyl.  Wants to know what to do about coumadin dosing.  Told her to have pt decrease coumadin to 1/2 tablet daily (1mg ) except 1 tablet (2mg ) on Mondays and Thursdays till INR check on 03/23/14.

## 2014-03-16 NOTE — Addendum Note (Signed)
Addended by: Nira Retort on: 03/16/2014 01:09 PM   Modules accepted: Orders

## 2014-03-18 LAB — STOOL CULTURE

## 2014-03-23 LAB — PANCREATIC ELASTASE, FECAL: Pancreatic Elastase-1, Stool: 331 mcg/g

## 2014-03-26 ENCOUNTER — Ambulatory Visit (INDEPENDENT_AMBULATORY_CARE_PROVIDER_SITE_OTHER): Payer: Medicare Other | Admitting: *Deleted

## 2014-03-26 ENCOUNTER — Ambulatory Visit (INDEPENDENT_AMBULATORY_CARE_PROVIDER_SITE_OTHER): Payer: Medicare Other | Admitting: Cardiovascular Disease

## 2014-03-26 ENCOUNTER — Encounter: Payer: Self-pay | Admitting: Cardiovascular Disease

## 2014-03-26 VITALS — BP 108/66 | HR 96 | Ht 63.0 in | Wt 140.0 lb

## 2014-03-26 DIAGNOSIS — A0472 Enterocolitis due to Clostridium difficile, not specified as recurrent: Secondary | ICD-10-CM

## 2014-03-26 DIAGNOSIS — R609 Edema, unspecified: Secondary | ICD-10-CM

## 2014-03-26 DIAGNOSIS — N184 Chronic kidney disease, stage 4 (severe): Secondary | ICD-10-CM

## 2014-03-26 DIAGNOSIS — I482 Chronic atrial fibrillation, unspecified: Secondary | ICD-10-CM

## 2014-03-26 DIAGNOSIS — I4891 Unspecified atrial fibrillation: Secondary | ICD-10-CM

## 2014-03-26 DIAGNOSIS — I5032 Chronic diastolic (congestive) heart failure: Secondary | ICD-10-CM

## 2014-03-26 DIAGNOSIS — Z5181 Encounter for therapeutic drug level monitoring: Secondary | ICD-10-CM

## 2014-03-26 DIAGNOSIS — Z7901 Long term (current) use of anticoagulants: Secondary | ICD-10-CM

## 2014-03-26 DIAGNOSIS — I701 Atherosclerosis of renal artery: Secondary | ICD-10-CM

## 2014-03-26 DIAGNOSIS — I10 Essential (primary) hypertension: Secondary | ICD-10-CM

## 2014-03-26 LAB — POCT INR: INR: 2.8

## 2014-03-26 MED ORDER — FUROSEMIDE 20 MG PO TABS: 20.0000 mg | ORAL_TABLET | ORAL | Status: DC

## 2014-03-26 NOTE — Progress Notes (Signed)
Patient ID: [name], female   DOB: 09-21-1928, 81 y.o.   MRN: [id number]      SUBJECTIVE: [name] is an 80 yo female with a history of essential HTN, atrial fibrillation on chronic coumadin therapy, renal dysfunction, PAD, right renal artery stenosis (1-59% in 04/2013), and chronic diastolic heart failure.  Echocardiography done on 02-01-13 showed normal LV systolic function, EF 60-65%, with mild to perhaps moderate mitral regurgitation, with no evidence of stenosis.  Wt today 140 lbs (140 at last visit). She has since been diagnosed with C. Diff and is taking Flagyl.  Her diarrhea has started tapering off over the past 2-3 days. However, she continues to feel very weak. She does not drink fluids very often.  ECG performed in the office today demonstrates atrial fibrillation, heart rate 96 beats per minute.      Review of Systems: As per "subjective", otherwise negative.  No Known Allergies  Current Outpatient Prescriptions  Medication Sig Dispense Refill  . ALPRAZolam (XANAX) 0.5 MG tablet Take 0.25 mg by mouth at bedtime as needed for anxiety.      Marland Kitchen amLODipine (NORVASC) 10 MG tablet TAKE 1 TABLET BY MOUTH EVERY DAY  30 tablet  6  . Biotin 5000 MCG TABS Take 1 tablet by mouth daily.       . Calcium Carbonate-Vitamin D (CALTRATE 600+D PO) Take 1 tablet by mouth 2 (two) times daily.       . carvedilol (COREG) 6.25 MG tablet Take 0.5 tablets (3.125 mg total) by mouth 2 (two) times daily.      . Cyanocobalamin (VITAMIN B 12 PO) Take by mouth daily.      Marland Kitchen diltiazem (CARDIZEM) 30 MG tablet Take 1 tablet (30 mg total) by mouth 2 (two) times daily.  60 tablet  6  . donepezil (ARICEPT) 5 MG tablet Take 5 mg by mouth at bedtime.       . furosemide (LASIX) 20 MG tablet Take 1 tablet (20 mg total) by mouth daily.  30 tablet  6  . HYDROcodone-acetaminophen (NORCO) 7.5-325 MG per tablet Take 1 tablet by mouth every 8 (eight) hours as needed for moderate pain.      . metroNIDAZOLE  (FLAGYL) 500 MG tablet Take 1 tablet (500 mg total) by mouth 3 (three) times daily. For 14 days  42 tablet  0  . Multiple Vitamin (MULTIVITAMIN) tablet Take 1 tablet by mouth daily.      . potassium chloride (K-DUR) 10 MEQ tablet Take 1 tablet (10 mEq total) by mouth daily.  30 tablet  6  . warfarin (COUMADIN) 2 MG tablet TAKE 1 TABLET BY MOUTH ONCE DAILY  90 tablet  3  . lisinopril (PRINIVIL,ZESTRIL) 10 MG tablet TAKE 1 TABLET BY MOUTH TWICE DAILY  60 tablet  6   No current facility-administered medications for this visit.    Past Medical History  Diagnosis Date  . Sinoatrial node dysfunction      SINUS BRADYCARDIA  . Hypertension     unspecified  . Atrial fibrillation   . Encounter for long-term (current) use of other medications     COUMADIN THERAPY  . Lower extremity weakness   . Bilateral renal artery stenosis     MODERATE by dopplers August 2012.  . Sinus bradycardia   . Chronic renal insufficiency     Past Surgical History  Procedure Laterality Date  . Knee arthroscopy    . Cardioversion    . Colonoscopy  05/2011  Dr. Allena KatzPatel: normal. no bx.    History   Social History  . Marital Status: Married    Spouse Name: [name]    Number of Children: 0  . Years of Education: 12   Occupational History  . RETIRED   .     Social History Main Topics  . Smoking status: Never Smoker   . Smokeless tobacco: Never Used     Comment: Never smoked  . Alcohol Use: No  . Drug Use: No  . Sexual Activity: Not on file   Other Topics Concern  . Not on file   Social History Narrative   Patient lives at home with her husband [name]([name]) Patient is retired. Patient has 12 th grade education.   Right handed.   Caffeine- two three cups of coffee daily.     Filed Vitals:   03/26/14 1317  BP: 108/66  Pulse: 96  Height: 5\' 3"  (1.6 m)  Weight: 140 lb (63.504 kg)    PHYSICAL EXAM General: NAD  Neck: No JVD, no thyromegaly.  Lungs: Clear to auscultation bilaterally with normal  respiratory effort.  CV: Nondisplaced PMI. Irregular rhythm, normal rate, normal S1/S2, no S3, II/VI pansystolic murmur along left sternal border. 1+ pretibial and periankle edema b/l.  Abdomen: Soft, nontender, no hepatosplenomegaly, no distention.  Neurologic: Alert and oriented x 3.  Psych: Normal affect.  Extremities: No clubbing or cyanosis.   ECG: reviewed and available in electronic records.      ASSESSMENT AND PLAN:  HYPERTENSION  Normal today. She has unilateral renal artery stenosis. Previously d/c prazosin. Will closely monitor given her propensity for high BP.   Peripheral edema  She has had exacerbation of chronic kidney disease in the past, and has right renal artery stenosis. Weight is stable at 140 lbs.  I will hold Lasix for the next week as she appears to be dehydrated, and then continue Lasix 20 mg every other day. She has been instructed to take potassium supplementation along with her Lasix. She will continue to wear knee-high compression stockings,  20-30 mm mercury.   Chronic renal insufficiency/Right renal artery stenosis  Repeat renal Dopplers in 2 months.   SINUS BRADYCARDIA  She is in atrial fibrillation. She is on Coreg, and I had previously reduced it to 3.125 mg bid (HR 96 bpm today). EF 60-65% in 01/2013. I will continue short-acting diltiazem 30 mg bid. If after adequate fluid repletion her HR is elevated, I will increase diltiazem.  Atrial fibrillation  She is in atrial fibrillation. She is on Coreg, and I had previously reduced it to 3.125 mg bid (HR 96 bpm today). EF 60-65% in 01/2013. I will continue short-acting diltiazem 30 mg bid. On Coumadin for anticoagulation, followed here in our clinic. Given abnormal TFT's, I have since discontinued amiodarone.   Chronic diastolic heart failure  Weight is stable at 140 lbs.  I will hold Lasix for the next week as she appears to be dehydrated, and then continue Lasix 20 mg every other day. She has been  instructed to take potassium supplementation along with her Lasix. She will continue to wear knee-high compression stockings,  20-30 mm mercury.   Gait instability  I previously made a referral to neurology, and she is now being managed by Dr. Terrace ArabiaYan.   Chronic diarrhea  Diagnosed with C. Diff colitis. Now on Flagyl. I emphasized the importance of adequate fluid repletion with an electrolyte mixture.  Dispo: f/u 3 months.  Prentice DockerSuresh Koneswaran, M.D., F.A.C.C.

## 2014-03-26 NOTE — Patient Instructions (Addendum)
   Hold Lasix for one week. Then resume taking Lasix once every other day. Continue all other medications.   Nurse visit in 2 weeks. Your physician wants you to follow up in:  3 months.

## 2014-04-03 ENCOUNTER — Emergency Department (HOSPITAL_COMMUNITY): Payer: Medicare Other

## 2014-04-03 ENCOUNTER — Telehealth: Payer: Self-pay | Admitting: *Deleted

## 2014-04-03 ENCOUNTER — Emergency Department (HOSPITAL_COMMUNITY)
Admission: EM | Admit: 2014-04-03 | Discharge: 2014-04-03 | Disposition: A | Discharge status: Home / Self Care | Payer: Medicare Other | Attending: Emergency Medicine | Admitting: Emergency Medicine

## 2014-04-03 ENCOUNTER — Encounter (HOSPITAL_COMMUNITY): Payer: Self-pay | Admitting: Emergency Medicine

## 2014-04-03 DIAGNOSIS — Z7901 Long term (current) use of anticoagulants: Secondary | ICD-10-CM | POA: Diagnosis not present

## 2014-04-03 DIAGNOSIS — R5381 Other malaise: Secondary | ICD-10-CM | POA: Insufficient documentation

## 2014-04-03 DIAGNOSIS — N390 Urinary tract infection, site not specified: Secondary | ICD-10-CM | POA: Diagnosis not present

## 2014-04-03 DIAGNOSIS — Z79899 Other long term (current) drug therapy: Secondary | ICD-10-CM | POA: Diagnosis not present

## 2014-04-03 DIAGNOSIS — R51 Headache: Secondary | ICD-10-CM | POA: Insufficient documentation

## 2014-04-03 DIAGNOSIS — R197 Diarrhea, unspecified: Secondary | ICD-10-CM | POA: Insufficient documentation

## 2014-04-03 DIAGNOSIS — R5383 Other fatigue: Secondary | ICD-10-CM

## 2014-04-03 DIAGNOSIS — I4891 Unspecified atrial fibrillation: Secondary | ICD-10-CM | POA: Insufficient documentation

## 2014-04-03 DIAGNOSIS — Z9889 Other specified postprocedural states: Secondary | ICD-10-CM | POA: Insufficient documentation

## 2014-04-03 DIAGNOSIS — I10 Essential (primary) hypertension: Secondary | ICD-10-CM | POA: Diagnosis not present

## 2014-04-03 LAB — COMPREHENSIVE METABOLIC PANEL
ALT: 13 U/L (ref 0–35)
AST: 30 U/L (ref 0–37)
Albumin: 3.8 g/dL (ref 3.5–5.2)
Alkaline Phosphatase: 62 U/L (ref 39–117)
Anion gap: 16 — ABNORMAL HIGH (ref 5–15)
BUN: 33 mg/dL — ABNORMAL HIGH (ref 6–23)
CALCIUM: 9.3 mg/dL (ref 8.4–10.5)
CO2: 22 mEq/L (ref 19–32)
CREATININE: 1.28 mg/dL — AB (ref 0.50–1.10)
Chloride: 99 mEq/L (ref 96–112)
GFR calc non Af Amer: 38 mL/min — ABNORMAL LOW (ref >90)
GFR, EST AFRICAN AMERICAN: 44 mL/min — AB (ref >90)
Glucose, Bld: 161 mg/dL — ABNORMAL HIGH (ref 70–99)
Potassium: 4.7 mEq/L (ref 3.7–5.3)
Sodium: 137 mEq/L (ref 137–147)
TOTAL PROTEIN: 6.5 g/dL (ref 6.0–8.3)
Total Bilirubin: 0.4 mg/dL (ref 0.3–1.2)

## 2014-04-03 LAB — URINALYSIS, ROUTINE W REFLEX MICROSCOPIC
Glucose, UA: NEGATIVE mg/dL
HGB URINE DIPSTICK: NEGATIVE
Ketones, ur: NEGATIVE mg/dL
Nitrite: POSITIVE — AB
SPECIFIC GRAVITY, URINE: 1.025 (ref 1.005–1.030)
UROBILINOGEN UA: 0.2 mg/dL (ref 0.0–1.0)
pH: 5 (ref 5.0–8.0)

## 2014-04-03 LAB — CBC WITH DIFFERENTIAL/PLATELET
BASOS ABS: 0.1 10*3/uL (ref 0.0–0.1)
Basophils Relative: 1 % (ref 0–1)
EOS ABS: 0.1 10*3/uL (ref 0.0–0.7)
EOS PCT: 1 % (ref 0–5)
HCT: 37.9 % (ref 36.0–46.0)
Hemoglobin: 12.3 g/dL (ref 12.0–15.0)
Lymphocytes Relative: 15 % (ref 12–46)
Lymphs Abs: 1 10*3/uL (ref 0.7–4.0)
MCH: 25.7 pg — AB (ref 26.0–34.0)
MCHC: 32.5 g/dL (ref 30.0–36.0)
MCV: 79.1 fL (ref 78.0–100.0)
Monocytes Absolute: 0.5 10*3/uL (ref 0.1–1.0)
Monocytes Relative: 8 % (ref 3–12)
Neutro Abs: 4.8 10*3/uL (ref 1.7–7.7)
Neutrophils Relative %: 75 % (ref 43–77)
Platelets: 284 10*3/uL (ref 150–400)
RBC: 4.79 MIL/uL (ref 3.87–5.11)
RDW: 17.4 % — AB (ref 11.5–15.5)
WBC: 6.4 10*3/uL (ref 4.0–10.5)

## 2014-04-03 LAB — URINE MICROSCOPIC-ADD ON

## 2014-04-03 LAB — PROTIME-INR
INR: 1.81 — AB (ref 0.00–1.49)
PROTHROMBIN TIME: 21 s — AB (ref 11.6–15.2)

## 2014-04-03 MED ORDER — SODIUM CHLORIDE 0.9 % IV BOLUS (SEPSIS)
1000.0000 mL | Freq: Once | INTRAVENOUS | Status: AC
  Administered 2014-04-03: 1000 mL via INTRAVENOUS

## 2014-04-03 MED ORDER — CEPHALEXIN 500 MG PO CAPS: 500.0000 mg | ORAL_CAPSULE | Freq: Four times a day (QID) | ORAL | Status: DC

## 2014-04-03 MED ORDER — CEFTRIAXONE SODIUM 1 G IJ SOLR
1.0000 g | Freq: Once | INTRAMUSCULAR | Status: AC
  Administered 2014-04-03: 1 g via INTRAVENOUS
  Filled 2014-04-03: qty 10

## 2014-04-03 NOTE — ED Provider Notes (Signed)
CSN: [id number]     Arrival date & time 04/03/14  1512 History  This chart was scribed for Benny Lennert, MD by Luisa Dago, ED Scribe. This patient was seen in room APA14/APA14 and the patient's care was started at 3:30 PM.    Chief Complaint  Patient presents with  . Fatigue   Patient is a 80 y.o. female presenting with diarrhea. The history is provided by the patient. No language interpreter was used.  Diarrhea Quality:  Watery Severity:  Moderate Onset quality:  Gradual Duration:  4 weeks Timing:  Intermittent Progression:  Partially resolved Relieved by:  Nothing Worsened by:  Nothing tried Associated symptoms: no abdominal pain, no chills, no recent cough and no headaches    HPI Comments: [name] is a 80 y.o. female who presents to the Emergency Department complaining of fatigue that started 1 week ago. Pt states that he was told by his PCP to report to the ED for evaluation. Husband says that she was recently treated for intermittent diarrhea, which she was prescribed medicine for (four weeks ago). He states that she hasn't been able to keep fluids down. Pt states that she feels like she has no energy. She is also complaining of associated intermittent back pain. Pt states that she has Denies any fever, chills, diaphoresis, or abdominal pain.   Past Medical History  Diagnosis Date  . Sinoatrial node dysfunction      SINUS BRADYCARDIA  . Hypertension     unspecified  . Atrial fibrillation   . Encounter for long-term (current) use of other medications     COUMADIN THERAPY  . Lower extremity weakness   . Bilateral renal artery stenosis     MODERATE by dopplers August 2012.  . Sinus bradycardia   . Chronic renal insufficiency    Past Surgical History  Procedure Laterality Date  . Knee arthroscopy    . Cardioversion    . Colonoscopy  05/2011    Dr. Allena Katz: normal. no bx.   Family History  Problem Relation Age of Onset  . Cancer Brother   . Heart failure  Mother   . Cirrhosis Other   . High blood pressure Mother    History  Substance Use Topics  . Smoking status: Never Smoker   . Smokeless tobacco: Never Used     Comment: Never smoked  . Alcohol Use: No   OB History   Grav Para Term Preterm Abortions TAB SAB Ect Mult Living                 Review of Systems  Constitutional: Positive for fatigue. Negative for chills and appetite change.  HENT: Negative for congestion, ear discharge and sinus pressure.   Eyes: Negative for discharge.  Respiratory: Negative for cough.   Cardiovascular: Negative for chest pain.  Gastrointestinal: Positive for diarrhea. Negative for abdominal pain.  Genitourinary: Negative for frequency and hematuria.  Musculoskeletal: Negative for back pain.  Skin: Negative for rash.  Neurological: Negative for seizures and headaches.  Psychiatric/Behavioral: Negative for hallucinations.      Allergies  Review of patient's allergies indicates no known allergies.  Home Medications   Prior to Admission medications   Medication Sig Start Date End Date Taking? Authorizing Provider  ALPRAZolam Prudy Feeler) 0.5 MG tablet Take 0.25 mg by mouth at bedtime as needed for anxiety.    Historical Provider, MD  amLODipine (NORVASC) 10 MG tablet TAKE 1 TABLET BY MOUTH EVERY DAY    Laqueta Linden,  MD  Biotin 5000 MCG TABS Take 1 tablet by mouth daily.     Historical Provider, MD  Calcium Carbonate-Vitamin D (CALTRATE 600+D PO) Take 1 tablet by mouth 2 (two) times daily.     Historical Provider, MD  carvedilol (COREG) 6.25 MG tablet Take 0.5 tablets (3.125 mg total) by mouth 2 (two) times daily. 08/16/13   Laqueta Linden, MD  Cyanocobalamin (VITAMIN B 12 PO) Take by mouth daily.    Historical Provider, MD  diltiazem (CARDIZEM) 30 MG tablet Take 1 tablet (30 mg total) by mouth 2 (two) times daily. 02/21/14   Laqueta Linden, MD  donepezil (ARICEPT) 5 MG tablet Take 5 mg by mouth at bedtime.     Historical Provider, MD   furosemide (LASIX) 20 MG tablet Take 1 tablet (20 mg total) by mouth every other day. 03/26/14   Laqueta Linden, MD  HYDROcodone-acetaminophen (NORCO) 7.5-325 MG per tablet Take 1 tablet by mouth every 8 (eight) hours as needed for moderate pain.    Historical Provider, MD  lisinopril (PRINIVIL,ZESTRIL) 10 MG tablet TAKE 1 TABLET BY MOUTH TWICE DAILY 11/06/13   Laqueta Linden, MD  metroNIDAZOLE (FLAGYL) 500 MG tablet Take 1 tablet (500 mg total) by mouth 3 (three) times daily. For 14 days 03/16/14   Nira Retort, NP  Multiple Vitamin (MULTIVITAMIN) tablet Take 1 tablet by mouth daily.    Historical Provider, MD  potassium chloride (K-DUR) 10 MEQ tablet Take 1 tablet (10 mEq total) by mouth daily. 01/19/14   Laqueta Linden, MD  warfarin (COUMADIN) 2 MG tablet TAKE 1 TABLET BY MOUTH ONCE DAILY 06/03/13   Laqueta Linden, MD   BP 150/76  Pulse 88  Temp(Src) 97.7 F (36.5 C) (Oral)  Resp 17  Ht  (1.626 m)  Wt 140 lb (63.504 kg)  BMI 24.02 kg/m2  SpO2 94%  Physical Exam  Constitutional: She is oriented to person, place, and time. She appears well-developed.  Mild to moderate generalized weakness.   HENT:  Head: Normocephalic.  Mouth/Throat: Mucous membranes are dry.  Eyes: Conjunctivae and EOM are normal. No scleral icterus.  Neck: Neck supple. No thyromegaly present.  Cardiovascular: Normal rate and regular rhythm.  Exam reveals no gallop and no friction rub.   No murmur heard. Pulmonary/Chest: No stridor. She has no wheezes. She has no rales. She exhibits no tenderness.  Abdominal: She exhibits no distension. There is no tenderness. There is no rebound.  Musculoskeletal: Normal range of motion. She exhibits no edema.  One plus pitting edema to lower extremities.  Lymphadenopathy:    She has no cervical adenopathy.  Neurological: She is oriented to person, place, and time. She exhibits normal muscle tone. Coordination normal.  Skin: No rash noted. No erythema.   Psychiatric: She has a normal mood and affect. Her behavior is normal.    ED Course  Procedures (including critical care time)  DIAGNOSTIC STUDIES: Oxygen Saturation is 94% on RA, low by my interpretation.    COORDINATION OF CARE: 3:39 PM- Pt advised of plan for treatment and pt agrees.  Labs Review Labs Reviewed - No data to display  Imaging Review No results found.   EKG Interpretation   Date/Time:  Tuesday April 03 2014 15:27:46 EDT Ventricular Rate:  103 PR Interval:    QRS Duration: 87 QT Interval:  404 QTC Calculation: 529 R Axis:   -30 Text Interpretation:  Atrial fibrillation Ventricular premature complex  Anterior infarct, old Nonspecific T  abnormalities, lateral leads Prolonged  QT interval Confirmed by Davona Kinoshita  MD, Melea Prezioso [id number]) on 04/03/2014 6:25:04  PM      MDM   Final diagnoses:  None    uti tx with keflex I personally performed the services described in this documentation, which was scribed in my presence. The recorded information has been reviewed and is accurate.    Benny Lennert, MD 04/03/14 [phone]

## 2014-04-03 NOTE — ED Notes (Signed)
Pt states she has been feeling week for quite some time, pt's PCP told pt to come to the ER. Pt treated recently for diarrhea.

## 2014-04-03 NOTE — Discharge Instructions (Signed)
Drink plenty of fluids.  Follow up with your md Thursday or friday

## 2014-04-03 NOTE — Telephone Encounter (Signed)
Pt's husband called stating pt has had diarrhea and she is feeling weak. Per husband he thinks pt needs to go into the hospital, I made him aware I will send a message to the nurse to see if there is anything they can do for the diarrhea. Please advise [phone]

## 2014-04-03 NOTE — Telephone Encounter (Signed)
I spoke with the pts husband- he stated patient was extremely weak, the more she drinks liquids , the more she vomits. She is also still having diarrhea and she has finished the flagyl. Diarrhea is not quite as bad as before. He said he took her to see her cardiologist and he told her she was dehydrated and needed to drink more fluids. He said her kidney doctor told her the same thing. I advised him that if she was feeling weak and unable to keep down fluids and may be dehydrated, that he needed to take her to the ED to be evaluated and possibly get IV fluids. He said he is going to take her to [facility] because they are closer to them.   Verlon Au, does pt need to be tested again for cdiff since she continues to have diarrhea?

## 2014-04-03 NOTE — Telephone Encounter (Signed)
Noted  

## 2014-04-04 ENCOUNTER — Telehealth: Payer: Self-pay | Admitting: *Deleted

## 2014-04-04 NOTE — Progress Notes (Signed)
Quick Note:    Normal  ______

## 2014-04-04 NOTE — Telephone Encounter (Addendum)
Appears patient went to [facility] ER yesterday. Dx with UTI.    Lets get another cdiff pcr if still having diarrhea.   Please schedule nonurgent follow up.

## 2014-04-04 NOTE — Telephone Encounter (Signed)
Pharmacy is calling to make sure patient is on both diltiazem and amlodipine. Pt is there wanting refill on amlodipine.

## 2014-04-04 NOTE — Telephone Encounter (Signed)
Confirmed with pharmacy.

## 2014-04-05 ENCOUNTER — Telehealth: Payer: Self-pay | Admitting: *Deleted

## 2014-04-05 ENCOUNTER — Other Ambulatory Visit: Payer: Self-pay

## 2014-04-05 DIAGNOSIS — R197 Diarrhea, unspecified: Secondary | ICD-10-CM

## 2014-04-05 NOTE — Telephone Encounter (Signed)
See other phone note. Pt going to do a cdiff and needs an ov please. Container and lab orders at the front desk.

## 2014-04-05 NOTE — Telephone Encounter (Signed)
Pt's husband called and pt is having really bad diarrhea and she went to the ER last night they gave her a antibiotic per husband she is still having diarrhea bad. I made pt a appt 05/07/14 with [facility] and pt is aware of this appt. Pt's husband would like to talk to a nurse to see what they can do please advise [phone]

## 2014-04-05 NOTE — Telephone Encounter (Signed)
pts husband is aware. They will come by and pick up container. It is at the front desk.  Please schedule ov

## 2014-04-05 NOTE — Addendum Note (Signed)
Addended by: Myra Rude on: 04/05/2014 04:42 PM   Modules accepted: Orders

## 2014-04-06 NOTE — Telephone Encounter (Signed)
Patient coming 05/07/14

## 2014-04-06 NOTE — Telephone Encounter (Signed)
PATIENT HAS APPOINTMENT 05/07/14

## 2014-04-09 ENCOUNTER — Encounter: Payer: Self-pay | Admitting: Cardiovascular Disease

## 2014-04-09 ENCOUNTER — Ambulatory Visit (INDEPENDENT_AMBULATORY_CARE_PROVIDER_SITE_OTHER): Payer: Medicare Other | Admitting: *Deleted

## 2014-04-09 VITALS — BP 121/69 | HR 70

## 2014-04-09 DIAGNOSIS — I10 Essential (primary) hypertension: Secondary | ICD-10-CM

## 2014-04-09 NOTE — Progress Notes (Signed)
Patient in office for BP & HR check.  Last seen 03/26/14 & was told to hold Lasix x 1 week, then resume every other day.  Since then, she continues to have issue with diarrhea and recent ED visit for UTI.  Not feeling well this morning as she has audible wheezing without stethoscope.  States she has had this x [redacted] week along with cough.  Ankles swelling also.  Stated she had visit with PMD (Vyas) on Friday, 04/06/14 & was told to do her Lasix BID x 2 days, then back to previous.  Also, stated that he told her to let him know if no improvement today.  Advised her to got to his office to request OV for today.  Patient verbalized understanding.

## 2014-04-10 ENCOUNTER — Telehealth: Payer: Self-pay | Admitting: Gastroenterology

## 2014-04-10 LAB — CLOSTRIDIUM DIFFICILE BY PCR: Toxigenic C. Difficile by PCR: DETECTED — CR

## 2014-04-10 MED ORDER — SACCHAROMYCES BOULARDII 250 MG PO CAPS: 250.0000 mg | ORAL_CAPSULE | Freq: Two times a day (BID) | ORAL | Status: DC

## 2014-04-10 MED ORDER — VANCOMYCIN HCL 125 MG PO CAPS: 125.0000 mg | ORAL_CAPSULE | Freq: Four times a day (QID) | ORAL | Status: DC

## 2014-04-10 NOTE — Progress Notes (Signed)
Quick Note:  See separate telephone note. ______ 

## 2014-04-10 NOTE — Telephone Encounter (Signed)
Spoke with Dr. Sherril Croon. Notified him of recent events of failure on Flagyl. He reports patient is on Vancomycin at this time.   She has already scheduled appointment later this month, she should keep for f/u.

## 2014-04-10 NOTE — Telephone Encounter (Signed)
Patient's CDiff is positive again.  She needs vancomycin 125mg  po QID for 2 weeks. I sent in RX but please make sure she gets it today. If needed pharmacy can give her liquid if cheaper.  Please find out how she is doing. She needs to drink plenty of fluids to stay hydrated. Consume low fat/low dairy/low fiber diet while being treated for C.Diff.

## 2014-04-10 NOTE — Telephone Encounter (Signed)
[facility] and cancelled the order for the Vancomycin. I called pt's husband and told him that we cancelled the prescription and Tana Coast, PA has spoke to Dr. Sherril Croon and she is being treated with that medication for the C-Diff. Reminded him to keep her appt on 05/07/2014 at 2:30 PM with Gerrit Halls, NP for follow up. He asked for reminder and I am sending him a letter so he will have on fridge.

## 2014-04-10 NOTE — Telephone Encounter (Signed)
I have called and informed pt. However, she is inpatient at [facility]. Per her husband she has some CHF and fluid problems. Per Tana Coast, PA , I called pt's nurse at [facility] , Halford Decamp, LPN. She said they are aware pt has C-Diff and have her on precautions And are treating her with Flagyl. I told her that this is her second time diagnosed with C-Diff and we are not sure if she ever recovered from the first time on Flagyl. She said that is nice to know and it would be nice if Tana Coast, PA could call Dr. Sherril Croon at [phone] and inform him.

## 2014-04-13 ENCOUNTER — Emergency Department (HOSPITAL_COMMUNITY): Payer: Medicare Other

## 2014-04-13 ENCOUNTER — Inpatient Hospital Stay (HOSPITAL_COMMUNITY)
Admission: EM | Admit: 2014-04-13 | Discharge: 2014-04-19 | DRG: 291 | Disposition: A | Discharge status: Home Health | Payer: Medicare Other | Attending: Internal Medicine | Admitting: Internal Medicine

## 2014-04-13 ENCOUNTER — Encounter (HOSPITAL_COMMUNITY): Payer: Self-pay | Admitting: Emergency Medicine

## 2014-04-13 DIAGNOSIS — I428 Other cardiomyopathies: Secondary | ICD-10-CM | POA: Diagnosis present

## 2014-04-13 DIAGNOSIS — J45909 Unspecified asthma, uncomplicated: Secondary | ICD-10-CM | POA: Diagnosis present

## 2014-04-13 DIAGNOSIS — I10 Essential (primary) hypertension: Secondary | ICD-10-CM

## 2014-04-13 DIAGNOSIS — I4891 Unspecified atrial fibrillation: Secondary | ICD-10-CM | POA: Diagnosis present

## 2014-04-13 DIAGNOSIS — N179 Acute kidney failure, unspecified: Secondary | ICD-10-CM

## 2014-04-13 DIAGNOSIS — J9601 Acute respiratory failure with hypoxia: Secondary | ICD-10-CM | POA: Diagnosis present

## 2014-04-13 DIAGNOSIS — I5032 Chronic diastolic (congestive) heart failure: Secondary | ICD-10-CM

## 2014-04-13 DIAGNOSIS — A0472 Enterocolitis due to Clostridium difficile, not specified as recurrent: Secondary | ICD-10-CM

## 2014-04-13 DIAGNOSIS — I5033 Acute on chronic diastolic (congestive) heart failure: Secondary | ICD-10-CM | POA: Diagnosis not present

## 2014-04-13 DIAGNOSIS — Z8249 Family history of ischemic heart disease and other diseases of the circulatory system: Secondary | ICD-10-CM | POA: Diagnosis not present

## 2014-04-13 DIAGNOSIS — I2510 Atherosclerotic heart disease of native coronary artery without angina pectoris: Secondary | ICD-10-CM | POA: Diagnosis present

## 2014-04-13 DIAGNOSIS — J40 Bronchitis, not specified as acute or chronic: Secondary | ICD-10-CM | POA: Diagnosis present

## 2014-04-13 DIAGNOSIS — E119 Type 2 diabetes mellitus without complications: Secondary | ICD-10-CM

## 2014-04-13 DIAGNOSIS — I319 Disease of pericardium, unspecified: Secondary | ICD-10-CM | POA: Diagnosis present

## 2014-04-13 DIAGNOSIS — Z79899 Other long term (current) drug therapy: Secondary | ICD-10-CM | POA: Diagnosis not present

## 2014-04-13 DIAGNOSIS — I509 Heart failure, unspecified: Secondary | ICD-10-CM | POA: Diagnosis present

## 2014-04-13 DIAGNOSIS — E785 Hyperlipidemia, unspecified: Secondary | ICD-10-CM | POA: Diagnosis present

## 2014-04-13 DIAGNOSIS — J9600 Acute respiratory failure, unspecified whether with hypoxia or hypercapnia: Secondary | ICD-10-CM

## 2014-04-13 DIAGNOSIS — I3139 Other pericardial effusion (noninflammatory): Secondary | ICD-10-CM

## 2014-04-13 DIAGNOSIS — I129 Hypertensive chronic kidney disease with stage 1 through stage 4 chronic kidney disease, or unspecified chronic kidney disease: Secondary | ICD-10-CM | POA: Diagnosis present

## 2014-04-13 DIAGNOSIS — E871 Hypo-osmolality and hyponatremia: Secondary | ICD-10-CM | POA: Diagnosis present

## 2014-04-13 DIAGNOSIS — J9620 Acute and chronic respiratory failure, unspecified whether with hypoxia or hypercapnia: Secondary | ICD-10-CM | POA: Diagnosis present

## 2014-04-13 DIAGNOSIS — A498 Other bacterial infections of unspecified site: Secondary | ICD-10-CM

## 2014-04-13 DIAGNOSIS — N183 Chronic kidney disease, stage 3 unspecified: Secondary | ICD-10-CM | POA: Diagnosis present

## 2014-04-13 DIAGNOSIS — I420 Dilated cardiomyopathy: Secondary | ICD-10-CM | POA: Diagnosis present

## 2014-04-13 DIAGNOSIS — I482 Chronic atrial fibrillation, unspecified: Secondary | ICD-10-CM

## 2014-04-13 DIAGNOSIS — I313 Pericardial effusion (noninflammatory): Secondary | ICD-10-CM

## 2014-04-13 DIAGNOSIS — E873 Alkalosis: Secondary | ICD-10-CM | POA: Diagnosis present

## 2014-04-13 DIAGNOSIS — R0602 Shortness of breath: Secondary | ICD-10-CM | POA: Diagnosis not present

## 2014-04-13 DIAGNOSIS — Z7901 Long term (current) use of anticoagulants: Secondary | ICD-10-CM

## 2014-04-13 DIAGNOSIS — J90 Pleural effusion, not elsewhere classified: Secondary | ICD-10-CM

## 2014-04-13 DIAGNOSIS — J9611 Chronic respiratory failure with hypoxia: Secondary | ICD-10-CM | POA: Diagnosis present

## 2014-04-13 DIAGNOSIS — I340 Nonrheumatic mitral (valve) insufficiency: Secondary | ICD-10-CM | POA: Diagnosis present

## 2014-04-13 DIAGNOSIS — R0902 Hypoxemia: Secondary | ICD-10-CM

## 2014-04-13 HISTORY — DX: Atherosclerotic heart disease of native coronary artery without angina pectoris: I25.10

## 2014-04-13 LAB — CBC WITH DIFFERENTIAL/PLATELET
BASOS PCT: 0 % (ref 0–1)
Basophils Absolute: 0 10*3/uL (ref 0.0–0.1)
Eosinophils Absolute: 0 10*3/uL (ref 0.0–0.7)
Eosinophils Relative: 0 % (ref 0–5)
HEMATOCRIT: 35 % — AB (ref 36.0–46.0)
Hemoglobin: 11.3 g/dL — ABNORMAL LOW (ref 12.0–15.0)
Lymphocytes Relative: 3 % — ABNORMAL LOW (ref 12–46)
Lymphs Abs: 0.2 10*3/uL — ABNORMAL LOW (ref 0.7–4.0)
MCH: 25.3 pg — ABNORMAL LOW (ref 26.0–34.0)
MCHC: 32.3 g/dL (ref 30.0–36.0)
MCV: 78.3 fL (ref 78.0–100.0)
MONOS PCT: 3 % (ref 3–12)
Monocytes Absolute: 0.2 10*3/uL (ref 0.1–1.0)
NEUTROS PCT: 94 % — AB (ref 43–77)
Neutro Abs: 7.4 10*3/uL (ref 1.7–7.7)
Platelets: 208 10*3/uL (ref 150–400)
RBC: 4.47 MIL/uL (ref 3.87–5.11)
RDW: 17.9 % — ABNORMAL HIGH (ref 11.5–15.5)
WBC: 7.9 10*3/uL (ref 4.0–10.5)

## 2014-04-13 LAB — BASIC METABOLIC PANEL
Anion gap: 17 — ABNORMAL HIGH (ref 5–15)
BUN: 65 mg/dL — AB (ref 6–23)
CO2: 20 mEq/L (ref 19–32)
CREATININE: 1.88 mg/dL — AB (ref 0.50–1.10)
Calcium: 9.2 mg/dL (ref 8.4–10.5)
Chloride: 92 mEq/L — ABNORMAL LOW (ref 96–112)
GFR calc Af Amer: 28 mL/min — ABNORMAL LOW (ref >90)
GFR, EST NON AFRICAN AMERICAN: 24 mL/min — AB (ref >90)
Glucose, Bld: 195 mg/dL — ABNORMAL HIGH (ref 70–99)
Potassium: 5.2 mEq/L (ref 3.7–5.3)
Sodium: 129 mEq/L — ABNORMAL LOW (ref 137–147)

## 2014-04-13 LAB — TROPONIN I: Troponin I: <0.3 ng/mL (ref <0.30)

## 2014-04-13 LAB — PRO B NATRIURETIC PEPTIDE: Pro B Natriuretic peptide (BNP): 13309 pg/mL — ABNORMAL HIGH (ref 0–450)

## 2014-04-13 LAB — PROTIME-INR
INR: 1.97 — ABNORMAL HIGH (ref 0.00–1.49)
Prothrombin Time: 22.4 seconds — ABNORMAL HIGH (ref 11.6–15.2)

## 2014-04-13 MED ORDER — VANCOMYCIN 50 MG/ML ORAL SOLUTION
ORAL | Status: AC
  Filled 2014-04-13: qty 5

## 2014-04-13 MED ORDER — VANCOMYCIN HCL 125 MG PO CAPS: 125.0000 mg | ORAL_CAPSULE | Freq: Four times a day (QID) | ORAL | Status: DC

## 2014-04-13 MED ORDER — ADULT MULTIVITAMIN W/MINERALS CH
1.0000 | ORAL_TABLET | Freq: Every day | ORAL | Status: DC
  Administered 2014-04-14 – 2014-04-19 (×6): 1 via ORAL
  Filled 2014-04-13 (×6): qty 1

## 2014-04-13 MED ORDER — DILTIAZEM HCL 30 MG PO TABS
30.0000 mg | ORAL_TABLET | Freq: Two times a day (BID) | ORAL | Status: DC
  Administered 2014-04-13 – 2014-04-19 (×12): 30 mg via ORAL
  Filled 2014-04-13 (×12): qty 1

## 2014-04-13 MED ORDER — PREDNISONE 20 MG PO TABS
40.0000 mg | ORAL_TABLET | Freq: Every day | ORAL | Status: AC
  Administered 2014-04-14 – 2014-04-16 (×3): 40 mg via ORAL
  Filled 2014-04-13 (×3): qty 2

## 2014-04-13 MED ORDER — WARFARIN SODIUM 1 MG PO TABS: 1.0000 mg | ORAL_TABLET | ORAL | Status: DC

## 2014-04-13 MED ORDER — GUAIFENESIN-CODEINE 100-10 MG/5ML PO SOLN
5.0000 mL | ORAL | Status: DC | PRN
  Administered 2014-04-13 – 2014-04-15 (×8): 5 mL via ORAL
  Filled 2014-04-13 (×10): qty 5

## 2014-04-13 MED ORDER — HYDROCODONE-ACETAMINOPHEN 7.5-325 MG PO TABS: 1.0000 | ORAL_TABLET | Freq: Three times a day (TID) | ORAL | Status: DC | PRN

## 2014-04-13 MED ORDER — CARVEDILOL 3.125 MG PO TABS
3.1250 mg | ORAL_TABLET | Freq: Two times a day (BID) | ORAL | Status: DC
  Administered 2014-04-13 – 2014-04-19 (×13): 3.125 mg via ORAL
  Filled 2014-04-13 (×13): qty 1

## 2014-04-13 MED ORDER — FUROSEMIDE 10 MG/ML IJ SOLN
40.0000 mg | Freq: Every day | INTRAMUSCULAR | Status: DC
Start: 2014-04-13 — End: 2014-04-14
  Administered 2014-04-13 – 2014-04-14 (×2): 40 mg via INTRAVENOUS
  Filled 2014-04-13 (×2): qty 4

## 2014-04-13 MED ORDER — DONEPEZIL HCL 5 MG PO TABS
5.0000 mg | ORAL_TABLET | Freq: Every day | ORAL | Status: DC
  Administered 2014-04-13 – 2014-04-18 (×6): 5 mg via ORAL
  Filled 2014-04-13 (×6): qty 1

## 2014-04-13 MED ORDER — VANCOMYCIN 50 MG/ML ORAL SOLUTION
125.0000 mg | Freq: Four times a day (QID) | ORAL | Status: DC
  Administered 2014-04-13 – 2014-04-19 (×24): 125 mg via ORAL
  Filled 2014-04-13 (×27): qty 2.5

## 2014-04-13 MED ORDER — BIOTIN 5000 MCG PO TABS: 1.0000 | ORAL_TABLET | Freq: Every day | ORAL | Status: DC

## 2014-04-13 MED ORDER — ALBUTEROL SULFATE (2.5 MG/3ML) 0.083% IN NEBU
2.5000 mg | INHALATION_SOLUTION | RESPIRATORY_TRACT | Status: DC
  Administered 2014-04-13 – 2014-04-14 (×7): 2.5 mg via RESPIRATORY_TRACT
  Filled 2014-04-13 (×7): qty 3

## 2014-04-13 MED ORDER — WARFARIN - PHYSICIAN DOSING INPATIENT
Freq: Every day | Status: DC
  Administered 2014-04-14 – 2014-04-15 (×2)

## 2014-04-13 MED ORDER — WARFARIN SODIUM 2 MG PO TABS
2.0000 mg | ORAL_TABLET | ORAL | Status: DC
  Administered 2014-04-13 – 2014-04-15 (×3): 2 mg via ORAL
  Filled 2014-04-13 (×3): qty 1

## 2014-04-13 MED ORDER — ALPRAZOLAM 0.25 MG PO TABS: 0.2500 mg | ORAL_TABLET | Freq: Every evening | ORAL | Status: DC | PRN

## 2014-04-13 MED ORDER — WARFARIN SODIUM 1 MG PO TABS: 1.0000 mg | ORAL_TABLET | Freq: Every evening | ORAL | Status: DC

## 2014-04-13 MED ORDER — SACCHAROMYCES BOULARDII 250 MG PO CAPS
250.0000 mg | ORAL_CAPSULE | Freq: Two times a day (BID) | ORAL | Status: DC
  Administered 2014-04-13 – 2014-04-19 (×12): 250 mg via ORAL
  Filled 2014-04-13 (×12): qty 1

## 2014-04-13 MED ORDER — IPRATROPIUM-ALBUTEROL 0.5-2.5 (3) MG/3ML IN SOLN
3.0000 mL | Freq: Once | RESPIRATORY_TRACT | Status: AC
  Administered 2014-04-13: 3 mL via RESPIRATORY_TRACT
  Filled 2014-04-13: qty 3

## 2014-04-13 MED ORDER — POTASSIUM CHLORIDE CRYS ER 10 MEQ PO TBCR
10.0000 meq | EXTENDED_RELEASE_TABLET | Freq: Every day | ORAL | Status: DC
  Administered 2014-04-14 – 2014-04-19 (×6): 10 meq via ORAL
  Filled 2014-04-13 (×9): qty 1

## 2014-04-13 MED ORDER — AMLODIPINE BESYLATE 5 MG PO TABS
10.0000 mg | ORAL_TABLET | Freq: Every day | ORAL | Status: DC
  Administered 2014-04-14 – 2014-04-19 (×6): 10 mg via ORAL
  Filled 2014-04-13 (×6): qty 2

## 2014-04-13 NOTE — ED Notes (Signed)
EKG given to Dr. Zackowski  

## 2014-04-13 NOTE — ED Notes (Signed)
Pt states she was just discharged from [facility] and came straight here. States she is here because her feet and legs are swollen

## 2014-04-13 NOTE — ED Notes (Signed)
Pt ambulated with SpO2 monitor.  When sitting before walking, pt SpO2 94%.  After a few steps, pt's SpO2 dropped to 88%.  Sensor was not able make a reading throughout ambulation.  88% was lowest reading.

## 2014-04-13 NOTE — ED Notes (Signed)
Pt c/o of leg swelling bilaterally x 2-3 weeks.  Pt reports hx of CHF, renal disease.  Pt was just at [facility] since Monday for leg swelling.  Husband states she received lasix and swelling went down but came right back at discharge.  Pt reports some intermittent SOB and cough.  NAD, respirations equal and unlabored, skin warm and dry.

## 2014-04-13 NOTE — ED Notes (Signed)
Pt. Placed on 2L [place]

## 2014-04-13 NOTE — H&P (Signed)
[facility] History and Physical  [name] VHQ:[id number] DOB: 01-11-1929    PCP:   Ignatius Specking., MD   Chief Complaint: Shortness of breath and coughing.  She was discharged from [facility] after a 5-day stay.  HPI: [name] is an 80 y.o. female with hx of afib on chronic anticoagulation, chroinc diastolic CHF, MR, HTN, CAD, bilateral renal stenosis with CKD, admitted to [facility] for CHF, discharged today, presents to the ER as she was having persistent coughs and shortness of breath.  She had developed C diff, and has been on oral vancomycin.  She no longer has diarrhea.  She has no orthopnea, but was having DOE, and frequent coughs.  Evalatuion in the ER included a chest CT which showed bilateral pleural effusions with compressive atelectasis, and moderate pericardial effusion.  Her Cr is 1.88, and her troponin was negative.  Her EKG showed afib with rate control, and some electrical alternans. She has been on prednisone for about a week from her PCP, but was never diagnosed with COPD or asthma.   Rewiew of Systems:  Constitutional: Negative for malaise, fever and chills. No significant weight loss or weight gain Eyes: Negative for eye pain, redness and discharge, diplopia, visual changes, or flashes of light. ENMT: Negative for ear pain, hoarseness, nasal congestion, sinus pressure and sore throat. No headaches; tinnitus, drooling, or problem swallowing. Cardiovascular: Negative for chest pain, palpitations, diaphoresis. Respiratory: Negative for cough, hemoptysis, wheezing and stridor. No pleuritic chestpain. Gastrointestinal: Negative for nausea, vomiting, diarrhea, constipation, abdominal pain, melena, blood in stool, hematemesis, jaundice and rectal bleeding.    Genitourinary: Negative for frequency, dysuria, incontinence,flank pain and hematuria; Musculoskeletal: Negative for back pain and neck pain. Negative for swelling and trauma.;  Skin: .  Negative for pruritus, rash, abrasions, bruising and skin lesion.; ulcerations Neuro: Negative for headache, lightheadedness and neck stiffness. Negative for weakness, altered level of consciousness , altered mental status, extremity weakness, burning feet, involuntary movement, seizure and syncope.  Psych: negative for anxiety, depression, insomnia, tearfulness, panic attacks, hallucinations, paranoia, suicidal or homicidal ideation   Past Medical History  Diagnosis Date  . Sinoatrial node dysfunction      SINUS BRADYCARDIA  . Hypertension     unspecified  . Atrial fibrillation   . Encounter for long-term (current) use of other medications     COUMADIN THERAPY  . Lower extremity weakness   . Bilateral renal artery stenosis     MODERATE by dopplers August 2012.  . Sinus bradycardia   . Chronic renal insufficiency   . Coronary artery disease     Past Surgical History  Procedure Laterality Date  . Knee arthroscopy    . Cardioversion    . Colonoscopy  05/2011    Dr. Allena Katz: normal. no bx.    Medications:  HOME MEDS: Prior to Admission medications   Medication Sig Start Date End Date Taking? Authorizing Provider  ALPRAZolam Prudy Feeler) 0.5 MG tablet Take 0.25 mg by mouth at bedtime as needed for anxiety.   Yes Historical Provider, MD  amLODipine (NORVASC) 10 MG tablet Take 10 mg by mouth daily.   Yes Historical Provider, MD  Biotin 5000 MCG TABS Take 1 tablet by mouth daily.    Yes Historical Provider, MD  Calcium Carbonate-Vitamin D (CALTRATE 600+D PO) Take 1 tablet by mouth 2 (two) times daily.    Yes Historical Provider, MD  carvedilol (COREG) 6.25 MG tablet Take 0.5 tablets (3.125 mg total) by mouth 2 (two) times  daily. 08/16/13  Yes Laqueta Linden, MD  Cyanocobalamin (VITAMIN B 12 PO) Take 1 tablet by mouth daily.    Yes Historical Provider, MD  diltiazem (CARDIZEM) 30 MG tablet Take 1 tablet (30 mg total) by mouth 2 (two) times daily. 02/21/14  Yes Laqueta Linden, MD   donepezil (ARICEPT) 5 MG tablet Take 5 mg by mouth at bedtime.    Yes Historical Provider, MD  furosemide (LASIX) 20 MG tablet Take 1 tablet (20 mg total) by mouth every other day. 03/26/14  Yes Laqueta Linden, MD  HYDROcodone-acetaminophen (NORCO) 7.5-325 MG per tablet Take 1 tablet by mouth every 8 (eight) hours as needed for moderate pain or severe pain.   Yes Historical Provider, MD  Multiple Vitamin (MULTIVITAMIN) tablet Take 1 tablet by mouth 2 (two) times daily.    Yes Historical Provider, MD  potassium chloride (K-DUR) 10 MEQ tablet Take 1 tablet (10 mEq total) by mouth daily. 01/19/14  Yes Laqueta Linden, MD  saccharomyces boulardii (FLORASTOR) 250 MG capsule Take 1 capsule (250 mg total) by mouth 2 (two) times daily. 04/10/14  Yes Tiffany Kocher, PA-C  cephALEXin (KEFLEX) 500 MG capsule Take 1 capsule (500 mg total) by mouth 4 (four) times daily. 04/03/14   Benny Lennert, MD  vancomycin (VANCOCIN) 125 MG capsule Take 1 capsule (125 mg total) by mouth 4 (four) times daily. 04/10/14   Tiffany Kocher, PA-C  warfarin (COUMADIN) 2 MG tablet Take 1-2 mg by mouth every evening. Takes one half tablet (  total) on Tuesdays and Thursdays, then takes one tablet (  total) on all other days    Historical Provider, MD     Allergies:  No Known Allergies  Social History:   reports that she has never smoked. She has never used smokeless tobacco. She reports that she does not drink alcohol or use illicit drugs.  Family History: Family History  Problem Relation Age of Onset  . Cancer Brother   . Heart failure Mother   . Cirrhosis Other   . High blood pressure Mother      Physical Exam: Filed Vitals:   04/13/14 1830 04/13/14 1900 04/13/14 1930 04/13/14 1956  BP: 113/82 118/94 127/68   Pulse:    73  Temp:      TempSrc:      Resp: Height:      Weight:      SpO2:    96%   Blood pressure 127/68, pulse 73, temperature 97.6 F (36.4 C), temperature source Oral, resp.  rate 25, height  (1.6 m), weight 68.04 kg (150 lb), SpO2 96.00%.  GEN:  Pleasant patient lying in the stretcher in no acute distress; cooperative with exam. PSYCH:  alert and oriented x4; does not appear anxious or depressed; affect is appropriate. HEENT: Mucous membranes pink and anicteric; PERRLA; EOM intact; no cervical lymphadenopathy nor thyromegaly or carotid bruit; no JVD; There were no stridor. Neck is very supple. Breasts:: Not examined CHEST WALL: No tenderness CHEST: Normal respiration, clear to auscultation bilaterally.  HEART: Irregular rate and rhythm.  There are no murmur, rub, or gallops.   BACK: No kyphosis or scoliosis; no CVA tenderness ABDOMEN: soft and non-tender; no masses, no organomegaly, normal abdominal bowel sounds; no pannus; no intertriginous candida. There is no rebound and no distention. Rectal Exam: Not done EXTREMITIES: No bone or joint deformity; age-appropriate arthropathy of the hands and knees; 2+edema.  no ulcerations.  There is no  calf tenderness. Genitalia: not examined PULSES: 2+ and symmetric SKIN: Normal hydration no rash or ulceration CNS: Cranial nerves 2-12 grossly intact no focal lateralizing neurologic deficit.  Speech is fluent; uvula elevated with phonation, facial symmetry and tongue midline. DTR are normal bilaterally, cerebella exam is intact, barbinski is negative and strengths are equaled bilaterally.  No sensory loss.   Labs on Admission:  Basic Metabolic Panel:  Recent Labs Lab 04/13/14 1611  NA 129*  K 5.2  CL 92*  CO2 20  GLUCOSE 195*  BUN 65*  CREATININE 1.88*  CALCIUM 9.2  CBC:  Recent Labs Lab 04/13/14 1611  WBC 7.9  NEUTROABS 7.4  HGB 11.3*  HCT 35.0*  MCV 78.3  PLT 208   Cardiac Enzymes: No results found for this basename: CKTOTAL, CKMB, CKMBINDEX, TROPONINI,  in the last 168 hours  CBG: No results found for this basename: GLUCAP,  in the last 168 hours   Radiological Exams on Admission: Dg Chest  2 View  04/13/2014   CLINICAL DATA:  Lower leg edema, shortness of breath cough and weakness  EXAM: CHEST  2 VIEW  COMPARISON:  PA and lateral chest x-ray of April 09, 2014  FINDINGS: There is persistent volume loss at the left lung base with further obscuration of the hemidiaphragm. There is a small right pleural effusion slightly more conspicuous than on the previous study. The lungs are adequately inflated. There is no focal infiltrate. The cardiopericardial silhouette is mildly enlarged. The pulmonary vascularity is not clearly engorged. The bony thorax is unremarkable.  IMPRESSION: Persistently increased density at the left lung base is consistent with atelectasis or pneumonia as well as small pleural effusion. There is no definite pulmonary edema.   Electronically Signed   By: David  [place]   On: 04/13/2014 16:53   Ct Chest Wo Contrast  04/13/2014   CLINICAL DATA:  Reported left lung abnormality on prior exam, leg swelling, atrial fibrillation, dilated cardiomyopathy  EXAM: CT CHEST WITHOUT CONTRAST  TECHNIQUE: Multidetector CT imaging of the chest was performed following the standard protocol without IV contrast.  COMPARISON:  04/13/2014, 04/09/2014  FINDINGS: Bilateral small pleural effusions are noted with associated compressive atelectasis, left greater than right lower lobe. Mild central bronchial wall thickening is noted. Central airways are patent. No pulmonary nodule or mass.  Heart size is mildly enlarged. Moderate pericardial effusion is present. Moderate atheromatous aortic calcification without aneurysm. Small mediastinal nodes are identified, largest in the pretracheal region measuring 0.6 cm image 18. Imaging of the upper abdomen is degraded by respiratory motion. Great vessels are normal in caliber. Remote left lateral second, third, fourth, fifth, sixth rib fractures are identified. No acute osseous abnormality. T4 compression deformity reidentified.  IMPRESSION: Small bilateral pleural  effusions and associated compressive atelectasis, left greater than right, accounting for the findings on prior CT. Early pneumonia could appear similar, however.  Mild cardiomegaly with moderate pericardial effusion.   Electronically Signed   By: Christiana Pellant M.D.   On: 04/13/2014 18:03    EKG: Independently reviewed. Afib with rate control.  ? Electrical alternans.   Assessment/Plan Present on Admission:  . Acute respiratory failure with hypoxia . Mitral regurgitation . HYPERTENSION, UNSPECIFIED . Atrial fibrillation . Dilated cardiomyopathy . Chronic diastolic heart failure . C. difficile colitis . Chronic respiratory failure with hypoxia  PLAN:  I think she may be a little volume overload, but not too much, and with CKD, will only diurese her slightly with Lasix  IV once daily.  I think she has a pulmonary component to her shortness of breath as well, and will increase her Prednisone to  per day, add neb to her regimen, and she likely will need home oxygen, since she desat to 88 percent and lower with ambulation.  For her afib, will continue with her anticoagulation and her rate control medications including cardiazem and coreg.  For her C diff, she no longer has diarrhea, and will finish off her oral vancomycin.  Will send her for another PCR to see if it is now negative.  She is stable, full code, and will be admitted to [facility] service under telemetry.  Thank you for allowing me to participate in her care.  Other plans as per orders.  Code Status: FULL Unk Lightning, MD. [facility] Pager [phone] 7pm to 7am.  04/13/2014, 8:12 PM

## 2014-04-13 NOTE — ED Provider Notes (Addendum)
CSN: [id number]     Arrival date & time 04/13/14  1407 History   First MD Initiated Contact with Patient 04/13/14 1510     Chief Complaint  Patient presents with  . Leg Swelling     (Consider location/radiation/quality/duration/timing/severity/associated sxs/prior Treatment) The history is provided by the patient.   80 year old female. Discharge today from [facility] after a five-day stay for what appears to be exacerbation of congestive heart failure. In addition patient was noted to be C. difficile positive. Was discharged home on oral vancomycin. Patient did not feel that she improved. Patient still feeling short of breath patient still is leg swelling. They were brought here directly by family following the discharge. Patient does not have home oxygen. Patient is followed by a [facility]. Patient denies any chest pain. Main complaint is bilateral leg swelling and shortness of breath with walking.  Past Medical History  Diagnosis Date  . Sinoatrial node dysfunction      SINUS BRADYCARDIA  . Hypertension     unspecified  . Atrial fibrillation   . Encounter for long-term (current) use of other medications     COUMADIN THERAPY  . Lower extremity weakness   . Bilateral renal artery stenosis     MODERATE by dopplers August 2012.  . Sinus bradycardia   . Chronic renal insufficiency   . Coronary artery disease    Past Surgical History  Procedure Laterality Date  . Knee arthroscopy    . Cardioversion    . Colonoscopy  05/2011    Dr. Allena Katz: normal. no bx.   Family History  Problem Relation Age of Onset  . Cancer Brother   . Heart failure Mother   . Cirrhosis Other   . High blood pressure Mother    History  Substance Use Topics  . Smoking status: Never Smoker   . Smokeless tobacco: Never Used     Comment: Never smoked  . Alcohol Use: No   OB History   Grav Para Term Preterm Abortions TAB SAB Ect Mult Living                 Review of Systems  Constitutional:  Positive for fatigue. Negative for fever.  HENT: Negative for congestion.   Eyes: Negative for visual disturbance.  Respiratory: Positive for shortness of breath.   Cardiovascular: Positive for leg swelling. Negative for chest pain.  Gastrointestinal: Negative for nausea, vomiting and abdominal pain.  Genitourinary: Negative for dysuria.  Musculoskeletal: Negative for back pain.  Skin: Negative for rash.  Neurological: Negative for headaches.  Hematological: Bruises/bleeds easily.  Psychiatric/Behavioral: Negative for confusion.      Allergies  Review of patient's allergies indicates no known allergies.  Home Medications   Prior to Admission medications   Medication Sig Start Date End Date Taking? Authorizing Provider  ALPRAZolam Prudy Feeler) 0.5 MG tablet Take 0.25 mg by mouth at bedtime as needed for anxiety.   Yes Historical Provider, MD  amLODipine (NORVASC) 10 MG tablet Take 10 mg by mouth daily.   Yes Historical Provider, MD  Biotin 5000 MCG TABS Take 1 tablet by mouth daily.    Yes Historical Provider, MD  Calcium Carbonate-Vitamin D (CALTRATE 600+D PO) Take 1 tablet by mouth 2 (two) times daily.    Yes Historical Provider, MD  carvedilol (COREG) 6.25 MG tablet Take 0.5 tablets (3.125 mg total) by mouth 2 (two) times daily. 08/16/13  Yes Laqueta Linden, MD  Cyanocobalamin (VITAMIN B 12 PO) Take 1 tablet by mouth daily.  Yes Historical Provider, MD  diltiazem (CARDIZEM) 30 MG tablet Take 1 tablet (30 mg total) by mouth 2 (two) times daily. 02/21/14  Yes Laqueta Linden, MD  donepezil (ARICEPT) 5 MG tablet Take 5 mg by mouth at bedtime.    Yes Historical Provider, MD  furosemide (LASIX) 20 MG tablet Take 1 tablet (20 mg total) by mouth every other day. 03/26/14  Yes Laqueta Linden, MD  HYDROcodone-acetaminophen (NORCO) 7.5-325 MG per tablet Take 1 tablet by mouth every 8 (eight) hours as needed for moderate pain or severe pain.   Yes Historical Provider, MD  Multiple  Vitamin (MULTIVITAMIN) tablet Take 1 tablet by mouth 2 (two) times daily.    Yes Historical Provider, MD  potassium chloride (K-DUR) 10 MEQ tablet Take 1 tablet (10 mEq total) by mouth daily. 01/19/14  Yes Laqueta Linden, MD  saccharomyces boulardii (FLORASTOR) 250 MG capsule Take 1 capsule (250 mg total) by mouth 2 (two) times daily. 04/10/14  Yes Tiffany Kocher, PA-C  cephALEXin (KEFLEX) 500 MG capsule Take 1 capsule (500 mg total) by mouth 4 (four) times daily. 04/03/14   Benny Lennert, MD  vancomycin (VANCOCIN) 125 MG capsule Take 1 capsule (125 mg total) by mouth 4 (four) times daily. 04/10/14   Tiffany Kocher, PA-C  warfarin (COUMADIN) 2 MG tablet Take 1-2 mg by mouth every evening. Takes one half tablet (  total) on Tuesdays and Thursdays, then takes one tablet (  total) on all other days    Historical Provider, MD   BP 113/82  Pulse 77  Temp(Src) 97.6 F (36.4 C) (Oral)  Resp 20  Ht  (1.6 m)  Wt 150 lb (68.04 kg)  BMI 26.58 kg/m2  SpO2 89% Physical Exam  Nursing note and vitals reviewed. Constitutional: She is oriented to person, place, and time. She appears well-developed and well-nourished. No distress.  HENT:  Head: Normocephalic and atraumatic.  Mouth/Throat: Oropharynx is clear and moist.  Eyes: Conjunctivae and EOM are normal. Pupils are equal, round, and reactive to light.  Neck: Normal range of motion. Neck supple.  Cardiovascular: Normal rate, regular rhythm and normal heart sounds.   No murmur heard. Pulmonary/Chest: Effort normal and breath sounds normal. No respiratory distress.  Abdominal: Soft. Bowel sounds are normal.  Musculoskeletal: Normal range of motion. She exhibits edema.  Neurological: She is alert and oriented to person, place, and time. No cranial nerve deficit. She exhibits normal muscle tone. Coordination normal.  Skin: Skin is warm. No erythema.    ED Course  Procedures (including critical care time) Labs Review Labs Reviewed  CBC  WITH DIFFERENTIAL - Abnormal; Notable for the following:    Hemoglobin 11.3 (*)    HCT 35.0 (*)    MCH 25.3 (*)    RDW 17.9 (*)    Neutrophils Relative % 94 (*)    Lymphocytes Relative 3 (*)    Lymphs Abs 0.2 (*)    All other components within normal limits  BASIC METABOLIC PANEL - Abnormal; Notable for the following:    Sodium 129 (*)    Chloride 92 (*)    Glucose, Bld 195 (*)    BUN 65 (*)    Creatinine, Ser 1.88 (*)    GFR calc non Af Amer 24 (*)    GFR calc Af Amer 28 (*)    Anion gap 17 (*)    All other components within normal limits  PROTIME-INR - Abnormal; Notable for the following:  Prothrombin Time 22.4 (*)    INR 1.97 (*)    All other components within normal limits  PRO B NATRIURETIC PEPTIDE - Abnormal; Notable for the following:    Pro B Natriuretic peptide (BNP) 13309.0 (*)    All other components within normal limits  CLOSTRIDIUM DIFFICILE BY PCR  TROPONIN I    Imaging Review Dg Chest 2 View  04/13/2014   CLINICAL DATA:  Lower leg edema, shortness of breath cough and weakness  EXAM: CHEST  2 VIEW  COMPARISON:  PA and lateral chest x-ray of April 09, 2014  FINDINGS: There is persistent volume loss at the left lung base with further obscuration of the hemidiaphragm. There is a small right pleural effusion slightly more conspicuous than on the previous study. The lungs are adequately inflated. There is no focal infiltrate. The cardiopericardial silhouette is mildly enlarged. The pulmonary vascularity is not clearly engorged. The bony thorax is unremarkable.  IMPRESSION: Persistently increased density at the left lung base is consistent with atelectasis or pneumonia as well as small pleural effusion. There is no definite pulmonary edema.   Electronically Signed   By: David  [place]   On: 04/13/2014 16:53   Ct Chest Wo Contrast  04/13/2014   CLINICAL DATA:  Reported left lung abnormality on prior exam, leg swelling, atrial fibrillation, dilated cardiomyopathy  EXAM: CT  CHEST WITHOUT CONTRAST  TECHNIQUE: Multidetector CT imaging of the chest was performed following the standard protocol without IV contrast.  COMPARISON:  04/13/2014, 04/09/2014  FINDINGS: Bilateral small pleural effusions are noted with associated compressive atelectasis, left greater than right lower lobe. Mild central bronchial wall thickening is noted. Central airways are patent. No pulmonary nodule or mass.  Heart size is mildly enlarged. Moderate pericardial effusion is present. Moderate atheromatous aortic calcification without aneurysm. Small mediastinal nodes are identified, largest in the pretracheal region measuring 0.6 cm image 18. Imaging of the upper abdomen is degraded by respiratory motion. Great vessels are normal in caliber. Remote left lateral second, third, fourth, fifth, sixth rib fractures are identified. No acute osseous abnormality. T4 compression deformity reidentified.  IMPRESSION: Small bilateral pleural effusions and associated compressive atelectasis, left greater than right, accounting for the findings on prior CT. Early pneumonia could appear similar, however.  Mild cardiomegaly with moderate pericardial effusion.   Electronically Signed   By: Christiana Pellant M.D.   On: 04/13/2014 18:03     EKG Interpretation   Date/Time:  Friday April 13 2014 15:11:50 EDT Ventricular Rate:  65 PR Interval:    QRS Duration: 93 QT Interval:  349 QTC Calculation: 363 R Axis:   -19 Text Interpretation:  Atrial fibrillation Low voltage, extremity leads  Baseline wander in lead(s) III No significant change since last tracing  Confirmed by Kinlee Garrison  MD, Marylee Belzer [phone]) on 04/13/2014 4:16:21 PM      MDM   Final diagnoses:  Hypoxia  Pleural effusion, bilateral  Clostridium difficile infection  Pericardial effusion    Patient is discharged after 5 days patient's stay at [facility]. Patient still feeling short of breath and was swelling in her legs. Patient when she ambulates  desaturates below 90% on room air. Patient does not have home oxygen. Patient denies any chest pain just the swelling in both legs and shortness of breath is the only complaint.  Patient's records from [place] show that she was treated for congestive heart failure. Troponin was negative. That was on August 31. We'll do a repeat here today. Patient's BNP was 1247  that was on September 3. Today's BUN he is increased at 13,000. This may be due to do a small read differences. We'll discuss with the hospitalist about admission. CT scan raises no concern for pneumonia there was some concern for pneumonia raised on chest x-ray. Patient's cardiologist is a [facility]. Patient has not had any chest pain.  In addition patient is C. difficile positive on the stool according to her stay at [facility]. She was discharged home on oral vancomycin. Patient had a diarrheal illness earlier at the end of August.      Vanetta Mulders, MD 04/13/14 6045  Vanetta Mulders, MD 04/13/14 1924  Vanetta Mulders, MD 04/13/14 1925  Vanetta Mulders, MD 04/13/14 4098

## 2014-04-14 DIAGNOSIS — N179 Acute kidney failure, unspecified: Secondary | ICD-10-CM

## 2014-04-14 LAB — CBC
HEMATOCRIT: 34 % — AB (ref 36.0–46.0)
Hemoglobin: 11 g/dL — ABNORMAL LOW (ref 12.0–15.0)
MCH: 25.1 pg — ABNORMAL LOW (ref 26.0–34.0)
MCHC: 32.4 g/dL (ref 30.0–36.0)
MCV: 77.6 fL — ABNORMAL LOW (ref 78.0–100.0)
Platelets: 181 10*3/uL (ref 150–400)
RBC: 4.38 MIL/uL (ref 3.87–5.11)
RDW: 17.6 % — ABNORMAL HIGH (ref 11.5–15.5)
WBC: 5.3 10*3/uL (ref 4.0–10.5)

## 2014-04-14 LAB — BASIC METABOLIC PANEL
Anion gap: 16 — ABNORMAL HIGH (ref 5–15)
BUN: 66 mg/dL — ABNORMAL HIGH (ref 6–23)
CALCIUM: 9.1 mg/dL (ref 8.4–10.5)
CO2: 24 meq/L (ref 19–32)
CREATININE: 1.84 mg/dL — AB (ref 0.50–1.10)
Chloride: 94 mEq/L — ABNORMAL LOW (ref 96–112)
GFR calc Af Amer: 28 mL/min — ABNORMAL LOW (ref >90)
GFR calc non Af Amer: 24 mL/min — ABNORMAL LOW (ref >90)
GLUCOSE: 178 mg/dL — AB (ref 70–99)
Potassium: 4.5 mEq/L (ref 3.7–5.3)
Sodium: 134 mEq/L — ABNORMAL LOW (ref 137–147)

## 2014-04-14 LAB — CLOSTRIDIUM DIFFICILE BY PCR: Toxigenic C. Difficile by PCR: POSITIVE — AB

## 2014-04-14 LAB — PROTIME-INR
INR: 2.09 — ABNORMAL HIGH (ref 0.00–1.49)
INR: 2.09 — AB (ref 0.00–1.49)
PROTHROMBIN TIME: 23.5 s — AB (ref 11.6–15.2)
Prothrombin Time: 23.5 seconds — ABNORMAL HIGH (ref 11.6–15.2)

## 2014-04-14 MED ORDER — SODIUM CHLORIDE 0.9 % IJ SOLN
3.0000 mL | INTRAMUSCULAR | Status: DC | PRN
  Administered 2014-04-14: 3 mL via INTRAVENOUS

## 2014-04-14 MED ORDER — BENZONATATE 100 MG PO CAPS
200.0000 mg | ORAL_CAPSULE | Freq: Three times a day (TID) | ORAL | Status: DC
  Administered 2014-04-14 – 2014-04-19 (×16): 200 mg via ORAL
  Filled 2014-04-14 (×16): qty 2

## 2014-04-14 MED ORDER — SODIUM CHLORIDE 0.9 % IJ SOLN
3.0000 mL | Freq: Two times a day (BID) | INTRAMUSCULAR | Status: DC
  Administered 2014-04-14 – 2014-04-19 (×10): 3 mL via INTRAVENOUS

## 2014-04-14 MED ORDER — SODIUM CHLORIDE 0.9 % IV SOLN: 250.0000 mL | INTRAVENOUS | Status: DC | PRN

## 2014-04-14 MED ORDER — FUROSEMIDE 10 MG/ML IJ SOLN
40.0000 mg | Freq: Two times a day (BID) | INTRAMUSCULAR | Status: DC
  Administered 2014-04-14 – 2014-04-17 (×7): 40 mg via INTRAVENOUS
  Filled 2014-04-14 (×8): qty 4

## 2014-04-14 NOTE — Progress Notes (Signed)
PROGRESS NOTE  [name] XNA:[id number] DOB: 10-28-1928 DOA: 04/13/2014 PCP: Ignatius Specking., MD Discharge today from [facility] after a five-day stay for what appears to be exacerbation of congestive heart failure. In addition patient was noted to be C. difficile positive. Was discharged home on oral vancomycin. Patient did not feel that she improved. Patient still feeling short of breath patient still is leg swelling  Summary: 80 year old woman with history of atrial fibrillation, chronic diastolic heart failure, hospitalized at [facility], discharged 9/4 who then came to ED [facility] same day for coughing for one week, shortness of breath and lower extremity edema. She was admitted for acute respiratory failure with hypoxia, acute on chronic diastolic heart failure, possible acute bronchitis.  Assessment/Plan: 1. Acute hypoxic respiratory failure likely secondary to diastolic heart failure or bronchitis. Imaging could not exclude developing pneumonia however clinically this is felt to be less likely. She has evidence of volume overload and is approximately 15 pounds above weight recorded one month ago. Given ongoing C. difficile colitis will hold off on other antibiotics. 2. Acute diastolic congestive heart failure. Plan as below. Echocardiogram on file. 3. Possible acute bronchitis. Continue steroids. 4. C. difficile colitis present on admission. Continue vancomycin. 5. Chronic diastolic congestive heart failure. Weight 8/17 at office 140 lb. 6. Acute renal failure superimposed on chronic kidney disease stage III. Suspect relative hypoperfusion of the kidneys. Trial of diuresis. 7. Atrial fibrillation, maintained on warfarin 8. hypertension with history of unilateral renal artery stenosis 9. Pericardial effusion, small was seen on echo from [facility] hospitalization   She appears stable with evidence of volume overload and apparent approximate 15 pound weight gain in the last month. Likely related  to dietary indiscretion. Plan IV diuresis with close monitoring of kidney function.  Continue vancomycin for C. difficile colitis  Code Status: full code DVT prophylaxis: warfarin Family Communication: sister at bedside Disposition Plan: home  Brendia Sacks, MD  [facility]  Pager [phone] If 7PM-7AM, please contact night-coverage at [email or web address], password [facility] 04/14/2014, 11:11 AM  LOS: 78 day   Consultants:    Procedures:  2-D echocardiogram scanned 04/09/2014 from [place], normal LVEF, no comment on diastolic fxn. Small pericardial effusion seen  Antibiotics:    HPI/Subjective: She was coughing which has been going on for a week or more. Still has lower extremity edema which she had when she left [place]. No chest pain. Mild SOB.  Objective: Filed Vitals:   04/13/14 2330 04/14/14 0420 04/14/14 0459 04/14/14 1016  BP:  134/63    Pulse:  82    Temp:  97.7 F (36.5 C)    TempSrc:  Oral    Resp:  20    Height:      Weight:      SpO2: 91% 98% 93% 97%    Intake/Output Summary (Last 24 hours) at 04/14/14 1111 Last data filed at 04/14/14 1005  Gross per 24 hour  Intake    200 ml  Output      7 ml  Net    193 ml     Filed Weights   04/13/14 1452 04/13/14 2128  Weight: 68.04 kg (150 lb) 70.353 kg (155 lb 1.6 oz)    Exam:     Afebrile, stable hypoxia. Gen. Appears calm and comfortable.  Psych. Alert. Speech fluent and clear.  Cardiovascular. Regular rate and rhythm. No murmur, rub or gallop. 2+ right lower extremity edema, 1+ left lower extremity. Telemetry atrial fibrillation.  Respiratory. Few wheezes. No rhonchi rales. Fair  air movement. Normal respiratory effort.  Data Reviewed: I/O: multiple voids  Chemistry: Hyponatremia has nearly resolved. Chloride has improved. BUN and creatinine without significant change. ProBNP was elevated on admission. INR therapeutic.  Heme: CBC stable  ID: C. difficile PCR positive.  Imaging: Admission CT  of the chest showed a moderate pericardial effusion. Compressive atelectasis.  Scheduled Meds: . albuterol  2.5 mg Nebulization Q4H  . amLODipine  10 mg Oral Daily  . carvedilol  3.125 mg Oral BID WC  . diltiazem  30 mg Oral BID  . donepezil  5 mg Oral QHS  . furosemide  40 mg Intravenous Daily  . multivitamin with minerals  1 tablet Oral Daily  . potassium chloride  10 mEq Oral Daily  . predniSONE  40 mg Oral QAC breakfast  . saccharomyces boulardii  250 mg Oral BID  . vancomycin  125 mg Oral 4 times per day  . [START ON 04/17/2014] warfarin  1 mg Oral Once per day on Tue Thu  . warfarin  2 mg Oral Once per day on Sun Mon Wed Fri Sat  . Warfarin - Physician Dosing Inpatient   Does not apply q1800   Continuous Infusions:   Principal Problem:   Acute respiratory failure with hypoxia Active Problems:   HYPERTENSION, UNSPECIFIED   Atrial fibrillation   Dilated cardiomyopathy   Mitral regurgitation   Chronic anticoagulation   Acute on chronic diastolic heart failure   Chronic diastolic heart failure   C. difficile colitis   Chronic respiratory failure with hypoxia   Time spent 25 minutes

## 2014-04-14 NOTE — Progress Notes (Signed)
Pt excessively coughing with 60 mins of relief after guafensin prn.  Dr Irene Limbo notified, order received for tesselon pearls at 1600.  Advised pt of new medication.  Will continue to monitor.

## 2014-04-14 NOTE — Progress Notes (Signed)
CRITICAL VALUE ALERT  Critical value received:  Positive c-diff PCR  Date of notification:  04/14/14  Time of notification:  0055  Critical value read back:Yes.    Nurse who received alert:  Jinny Sanders, RN  MD notified (1st page):  P. Le  Time of first page:  0055  MD notified (2nd page):  Time of second page:  Responding MD:  P. Le  Time MD responded:  0100

## 2014-04-14 NOTE — Progress Notes (Signed)
Utilization review Completed Jullian Clayson RN BSN   

## 2014-04-15 LAB — BASIC METABOLIC PANEL
ANION GAP: 13 (ref 5–15)
BUN: 62 mg/dL — AB (ref 6–23)
CHLORIDE: 98 meq/L (ref 96–112)
CO2: 26 mEq/L (ref 19–32)
Calcium: 8.8 mg/dL (ref 8.4–10.5)
Creatinine, Ser: 1.69 mg/dL — ABNORMAL HIGH (ref 0.50–1.10)
GFR, EST AFRICAN AMERICAN: 31 mL/min — AB (ref >90)
GFR, EST NON AFRICAN AMERICAN: 27 mL/min — AB (ref >90)
Glucose, Bld: 240 mg/dL — ABNORMAL HIGH (ref 70–99)
POTASSIUM: 4.3 meq/L (ref 3.7–5.3)
SODIUM: 137 meq/L (ref 137–147)

## 2014-04-15 LAB — PROTIME-INR
INR: 2.68 — ABNORMAL HIGH (ref 0.00–1.49)
PROTHROMBIN TIME: 28.5 s — AB (ref 11.6–15.2)

## 2014-04-15 MED ORDER — ALBUTEROL SULFATE (2.5 MG/3ML) 0.083% IN NEBU
2.5000 mg | INHALATION_SOLUTION | Freq: Four times a day (QID) | RESPIRATORY_TRACT | Status: DC
  Administered 2014-04-15 – 2014-04-16 (×6): 2.5 mg via RESPIRATORY_TRACT
  Filled 2014-04-15 (×6): qty 3

## 2014-04-15 NOTE — Progress Notes (Signed)
PROGRESS NOTE  [name] JQZ:[id number] DOB: 1929/01/19 DOA: 04/13/2014 PCP: Ignatius Specking., MD Discharge today from [facility] after a five-day stay for what appears to be exacerbation of congestive heart failure. In addition patient was noted to be C. difficile positive. Was discharged home on oral vancomycin. Patient did not feel that she improved. Patient still feeling short of breath patient still is leg swelling  Summary: 80 year old woman with history of atrial fibrillation, chronic diastolic heart failure, hospitalized at [facility], discharged 9/4 who then came to ED [facility] same day for coughing for one week, shortness of breath and lower extremity edema. She was admitted for acute respiratory failure with hypoxia, acute on chronic diastolic heart failure, possible acute bronchitis.  Assessment/Plan: 1. Acute hypoxic respiratory failure likely secondary to diastolic heart failure or bronchitis. Imaging could not exclude developing pneumonia however clinically this is felt to be less likely. She has evidence of volume overload and is approximately 15 pounds above weight recorded one month ago. Given ongoing C. difficile colitis will hold off on other antibiotics. 2. Acute diastolic congestive heart failure. Plan as below. Echocardiogram on file. 3. Possible acute bronchitis. Continue steroids. 4. C. difficile colitis present on admission. Continue vancomycin. 5. Chronic diastolic congestive heart failure. Weight 8/17 at office 140 lb. 6. Acute renal failure superimposed on chronic kidney disease stage III. Suspect relative hypoperfusion of the kidneys. Improve with diuresis. 7. Atrial fibrillation, maintained on warfarin 8. Hypertension with history of unilateral renal artery stenosis 9. Pericardial effusion, small was seen on echo from [facility] hospitalization   Somewhat improved today. Continue IV diuresis. Follow kidney function.  Continue oral vancomycin.  Code Status: full  code DVT prophylaxis: warfarin Family Communication: sister at bedside Tigan 9/6 Disposition Plan: home  Brendia Sacks, MD  [facility]  Pager [phone] If 7PM-7AM, please contact night-coverage at [email or web address], password [facility] 04/15/2014, 3:47 PM  LOS: 2 days   Consultants:    Procedures:  2-D echocardiogram scanned 04/09/2014 from [place], normal LVEF, no comment on diastolic fxn. Small pericardial effusion seen  Antibiotics:    HPI/Subjective: She still coughing but feeling somewhat better. Lower extremity edema has improved somewhat.  Objective: Filed Vitals:   04/14/14 2349 04/15/14 0656 04/15/14 0901 04/15/14 1349  BP:  119/63  124/50  Pulse: 72 82  58  Temp:  97.9 F (36.6 C)  97.9 F (36.6 C)  TempSrc:  Oral  Oral  Resp: 20 20  20   Height:      Weight:  65.953 kg (145 lb 6.4 oz)    SpO2: 95% 98% 98% 97%    Intake/Output Summary (Last 24 hours) at 04/15/14 1547 Last data filed at 04/15/14 1300  Gross per 24 hour  Intake    443 ml  Output   1550 ml  Net  -1107 ml     Filed Weights   04/13/14 1452 04/13/14 2128 04/15/14 0656  Weight: 68.04 kg (150 lb) 70.353 kg (155 lb 1.6 oz) 65.953 kg (145 lb 6.4 oz)    Exam:  Remains afebrile, vitals are stable. Stable hypoxia.  Respiratory clear to auscultation bilaterally. No wheezes, rales or rhonchi. Normal respiratory effort.  Cardiovascular regular rate and rhythm. No murmur, gallop. 2+ bilateral lower extremity edema, mild improvement.  Gen. Appears calm and comfortable.  Psychiatric grossly normal mood and affect. Speech fluent and appropriate.  Data Reviewed:  Urine output 1200. -577 since admission.  BUN and creatinine have improved with diuresis.  INR is therapeutic.  Scheduled Meds: .  albuterol  2.5 mg Nebulization QID  . amLODipine  10 mg Oral Daily  . benzonatate  200 mg Oral TID  . carvedilol  3.125 mg Oral BID WC  . diltiazem  30 mg Oral BID  . donepezil  5 mg Oral QHS  .  furosemide  40 mg Intravenous BID  . multivitamin with minerals  1 tablet Oral Daily  . potassium chloride  10 mEq Oral Daily  . predniSONE  40 mg Oral QAC breakfast  . saccharomyces boulardii  250 mg Oral BID  . sodium chloride  3 mL Intravenous Q12H  . vancomycin  125 mg Oral 4 times per day  . [START ON 04/17/2014] warfarin  1 mg Oral Once per day on Tue Thu  . warfarin  2 mg Oral Once per day on Sun Mon Wed Fri Sat  . Warfarin - Physician Dosing Inpatient   Does not apply q1800   Continuous Infusions:   Principal Problem:   Acute respiratory failure with hypoxia Active Problems:   HYPERTENSION, UNSPECIFIED   Atrial fibrillation   Dilated cardiomyopathy   Mitral regurgitation   Chronic anticoagulation   Acute on chronic diastolic heart failure   Chronic diastolic heart failure   C. difficile colitis   Chronic respiratory failure with hypoxia   Acute renal failure   Time spent 15 minutes

## 2014-04-16 LAB — PROTIME-INR
INR: 3.12 — ABNORMAL HIGH (ref 0.00–1.49)
PROTHROMBIN TIME: 32.1 s — AB (ref 11.6–15.2)

## 2014-04-16 LAB — BASIC METABOLIC PANEL
Anion gap: 13 (ref 5–15)
BUN: 59 mg/dL — ABNORMAL HIGH (ref 6–23)
CALCIUM: 8.9 mg/dL (ref 8.4–10.5)
CO2: 29 meq/L (ref 19–32)
CREATININE: 1.66 mg/dL — AB (ref 0.50–1.10)
Chloride: 98 mEq/L (ref 96–112)
GFR, EST AFRICAN AMERICAN: 32 mL/min — AB (ref >90)
GFR, EST NON AFRICAN AMERICAN: 28 mL/min — AB (ref >90)
Glucose, Bld: 278 mg/dL — ABNORMAL HIGH (ref 70–99)
Potassium: 4.3 mEq/L (ref 3.7–5.3)
Sodium: 140 mEq/L (ref 137–147)

## 2014-04-16 MED ORDER — ALBUTEROL SULFATE (2.5 MG/3ML) 0.083% IN NEBU
2.5000 mg | INHALATION_SOLUTION | Freq: Three times a day (TID) | RESPIRATORY_TRACT | Status: DC
  Administered 2014-04-16 – 2014-04-19 (×8): 2.5 mg via RESPIRATORY_TRACT
  Filled 2014-04-16 (×9): qty 3

## 2014-04-16 MED ORDER — PREDNISONE 20 MG PO TABS
40.0000 mg | ORAL_TABLET | Freq: Every day | ORAL | Status: AC
  Administered 2014-04-17 – 2014-04-18 (×2): 40 mg via ORAL
  Filled 2014-04-16 (×2): qty 2

## 2014-04-16 NOTE — Progress Notes (Signed)
PROGRESS NOTE  [name] TKP:[id number] DOB: 1928-09-10 DOA: 04/13/2014 PCP: Ignatius Specking., MD Summary: 80 year old woman with history of atrial fibrillation, chronic diastolic heart failure, hospitalized at [facility], discharged 9/4 who then came to ED [facility] same day for coughing for one week, shortness of breath and lower extremity edema. She was admitted for acute respiratory failure with hypoxia, acute on chronic diastolic heart failure, acute renal failure, possible acute bronchitis. She has gradually improved with diuresis and oral steroids. Plan to continue the diuresis to closer to 140 lbs. anticipate discharge in the next 2-3 days.  Assessment/Plan: 1. Acute hypoxic respiratory failure likely secondary to diastolic heart failure or bronchitis. Resolved. Imaging could not exclude developing pneumonia however clinically this is felt to be unlikely. There is evidence of significant volume overload. Given ongoing C. difficile colitis will hold off on other antibiotics. 2. Acute diastolic congestive heart failure. Plan as below. Echocardiogram on file. 3. Possible acute bronchitis. Stable. Continue steroids. 4. C. difficile colitis present on admission. Stable. Continue vancomycin. 5. Chronic diastolic congestive heart failure. Weight 8/17 at office 140 lb. 6. Acute renal failure superimposed on chronic kidney disease stage III. Suspect relative hypoperfusion of the kidneys. Continues to improve with diuresis 7. Atrial fibrillation, on warfarin, Coreg, diltiazem per cardiology, stable. 8. Hypertension with history of unilateral renal artery stenosis, stable. 9. Small pericardial effusion, asymptomatic, was seen on echo from [facility] hospitalization.   Continues to improve with decreasing BUN/creatinine, weight loss, negative fluid balance and decreasing lower extremity edema. Still has significant volume overload. Plan to continue IV diuresis until close to 140 pounds.  Code Status: full  code DVT prophylaxis: warfarin Family Communication: sister at bedside Tigan 9/6 Disposition Plan: home  Brendia Sacks, MD  [facility]  Pager [phone] If 7PM-7AM, please contact night-coverage at [email or web address], password [facility] 04/16/2014, 12:24 PM  LOS: 3 days   Consultants:    Procedures:  2-D echocardiogram scanned 04/09/2014 from [place], normal LVEF, no comment on diastolic fxn. Small pericardial effusion seen  Antibiotics:    HPI/Subjective: Perhaps feeling a little bit better. Moving feet in his ear, somewhat decreased lower extremity swelling. Continues to have diarrhea, approximately 3 stools yesterday, 3 today. Continues to have cough.  Objective: Filed Vitals:   04/15/14 1919 04/15/14 2228 04/16/14 0552 04/16/14 1153  BP:  111/57 121/60 121/55  Pulse:  90 103   Temp:  99 F (37.2 C) 98.4 F (36.9 C)   TempSrc:  Oral Oral   Resp:  20 20   Height:      Weight:   64.728 kg (142 lb 11.2 oz)   SpO2: 96% 98% 99%     Intake/Output Summary (Last 24 hours) at 04/16/14 1224 Last data filed at 04/16/14 0800  Gross per 24 hour  Intake      0 ml  Output   1600 ml  Net  -1600 ml     Filed Weights   04/13/14 2128 04/15/14 0656 04/16/14 0552  Weight: 70.353 kg (155 lb 1.6 oz) 65.953 kg (145 lb 6.4 oz) 64.728 kg (142 lb 11.2 oz)    Exam:  Afebrile, vitals stable. No hypoxia.  Respiratory. Clear to auscultation bilaterally. No wheezes, rales or rhonchi. Normal respiratory effort.  Cardiovascular regular rate and rhythm. No murmur, rub or gallop. 2+ pitting bilateral lower extremity edema, slightly improved.  Gen. Appears calm, comfortable. Eating lunch.  Psychiatric. Grossly normal mood and affect. Speech fluent and appropriate.  Data Reviewed:  Urine output 1100. -1474 since  admission. Down approximately 8 pounds since admission.  BUN and creatinine continue to improve with diuresis.  INR 3.12.  Scheduled Meds: . albuterol  2.5 mg  Nebulization QID  . amLODipine  10 mg Oral Daily  . benzonatate  200 mg Oral TID  . carvedilol  3.125 mg Oral BID WC  . diltiazem  30 mg Oral BID  . donepezil  5 mg Oral QHS  . furosemide  40 mg Intravenous BID  . multivitamin with minerals  1 tablet Oral Daily  . potassium chloride  10 mEq Oral Daily  . saccharomyces boulardii  250 mg Oral BID  . sodium chloride  3 mL Intravenous Q12H  . vancomycin  125 mg Oral 4 times per day   Continuous Infusions:   Principal Problem:   Acute respiratory failure with hypoxia Active Problems:   HYPERTENSION, UNSPECIFIED   Atrial fibrillation   Dilated cardiomyopathy   Mitral regurgitation   Chronic anticoagulation   Acute on chronic diastolic heart failure   Chronic diastolic heart failure   C. difficile colitis   Chronic respiratory failure with hypoxia   Acute renal failure   Time spent 15 minutes

## 2014-04-16 NOTE — Progress Notes (Signed)
ANTICOAGULATION CONSULT NOTE - Initial Consult  Pharmacy Consult for Warfarin Indication: atrial fibrillation  No Known Allergies  Patient Measurements: Height: 5\' 3"  (160 cm) Weight: 142 lb 11.2 oz (64.728 kg) IBW/kg (Calculated) : 52.4 Heparin Dosing Weight:   Vital Signs: Temp: 98.4 F (36.9 C) (09/07 0552) Temp src: Oral (09/07 0552) BP: 121/55 mmHg (09/07 1153) Pulse Rate: 103 (09/07 0552)  Labs:  Recent Labs  04/13/14 1611 04/13/14 1938 04/14/14 0530  04/14/14 1727 04/15/14 0551 04/16/14 0521  HGB 11.3*  --  11.0*  --   --   --   --   HCT 35.0*  --  34.0*  --   --   --   --   PLT 208  --  181  --   --   --   --   LABPROT 22.4*  --   --   < > 23.5* 28.5* 32.1*  INR 1.97*  --   --   < > 2.09* 2.68* 3.12*  CREATININE 1.88*  --  1.84*  --   --  1.69* 1.66*  TROPONINI  --  <0.30  --   --   --   --   --   < > = values in this interval not displayed.  Estimated Creatinine Clearance: 23.6 ml/min (by C-G formula based on Cr of 1.66).   Medical History: Past Medical History  Diagnosis Date  . Sinoatrial node dysfunction      SINUS BRADYCARDIA  . Hypertension     unspecified  . Atrial fibrillation   . Encounter for long-term (current) use of other medications     COUMADIN THERAPY  . Lower extremity weakness   . Bilateral renal artery stenosis     MODERATE by dopplers August 2012.  . Sinus bradycardia   . Chronic renal insufficiency   . Coronary artery disease     Medications:  Scheduled:  . albuterol  2.5 mg Nebulization QID  . amLODipine  10 mg Oral Daily  . benzonatate  200 mg Oral TID  . carvedilol  3.125 mg Oral BID WC  . diltiazem  30 mg Oral BID  . donepezil  5 mg Oral QHS  . furosemide  40 mg Intravenous BID  . multivitamin with minerals  1 tablet Oral Daily  . potassium chloride  10 mEq Oral Daily  . saccharomyces boulardii  250 mg Oral BID  . sodium chloride  3 mL Intravenous Q12H  . vancomycin  125 mg Oral 4 times per day     Assessment: Continuation PTA warfarin for AFIB MD continued home regiment on admission INR unexpectedly now elevated  Goal of Therapy:  INR 2-3 Monitor platelets by anticoagulation protocol: Yes   Plan:  No warfarin today due to elevated INR PT/INR daily   Raquel James, Kemonte Ullman Bennett 04/16/2014,12:10 PM

## 2014-04-16 NOTE — Care Management Note (Signed)
    Page 1 of 1   04/20/2014     9:00:38 AM CARE MANAGEMENT NOTE 04/20/2014  Patient:  [name], [name]   Account Number:  [id number]  Date Initiated:  04/16/2014  Documentation initiated by:  Anibal Henderson  Subjective/Objective Assessment:   admitted with  respiratoey failure, hypoxia. She is from home with spouse, and will return home at D/C     Action/Plan:   Will follow for needs   Anticipated DC Date:  04/19/2014   Anticipated DC Plan:  HOME W HOME HEALTH SERVICES      DC Planning Services  CM consult      [facility] Choice  HOME HEALTH   Choice offered to / List presented to:  C-1 Patient        HH arranged  HH-1 RN  HH-2 PT  HH-10 DISEASE MANAGEMENT      HH agency  [facility]   Status of service:  Completed, signed off Medicare Important Message given?  YES (If response is "NO", the following Medicare IM given date fields will be blank) Date Medicare IM given:  04/19/2014 Medicare IM given by:  Anibal Henderson Date Additional Medicare IM given:   Additional Medicare IM given by:    Discharge Disposition:  HOME W HOME HEALTH SERVICES  Per UR Regulation:  Reviewed for med. necessity/level of care/duration of stay  If discussed at Long Length of Stay Meetings, dates discussed:    Comments:  04/19/14 1500 Anibal Henderson RN/CM Pt going home with [facility] and uses [facility] in [place]. HH set up with them, and orders and information sent. They will see the pt over the weekend if possible, and definately by Monday. 04/16/14 1530 Anibal Henderson RN/CM

## 2014-04-17 DIAGNOSIS — N179 Acute kidney failure, unspecified: Secondary | ICD-10-CM

## 2014-04-17 DIAGNOSIS — A0472 Enterocolitis due to Clostridium difficile, not specified as recurrent: Secondary | ICD-10-CM

## 2014-04-17 DIAGNOSIS — I5033 Acute on chronic diastolic (congestive) heart failure: Principal | ICD-10-CM

## 2014-04-17 LAB — HEMOGLOBIN A1C
HEMOGLOBIN A1C: 7.1 % — AB (ref <5.7)
Mean Plasma Glucose: 157 mg/dL — ABNORMAL HIGH (ref <117)

## 2014-04-17 LAB — BASIC METABOLIC PANEL
ANION GAP: 8 (ref 5–15)
BUN: 46 mg/dL — ABNORMAL HIGH (ref 6–23)
CALCIUM: 8.7 mg/dL (ref 8.4–10.5)
CO2: 34 meq/L — AB (ref 19–32)
CREATININE: 1.27 mg/dL — AB (ref 0.50–1.10)
Chloride: 98 mEq/L (ref 96–112)
GFR calc Af Amer: 44 mL/min — ABNORMAL LOW (ref >90)
GFR, EST NON AFRICAN AMERICAN: 38 mL/min — AB (ref >90)
Glucose, Bld: 245 mg/dL — ABNORMAL HIGH (ref 70–99)
Potassium: 4 mEq/L (ref 3.7–5.3)
SODIUM: 140 meq/L (ref 137–147)

## 2014-04-17 LAB — CBC
HCT: 35.8 % — ABNORMAL LOW (ref 36.0–46.0)
Hemoglobin: 11.5 g/dL — ABNORMAL LOW (ref 12.0–15.0)
MCH: 25.4 pg — ABNORMAL LOW (ref 26.0–34.0)
MCHC: 32.1 g/dL (ref 30.0–36.0)
MCV: 79 fL (ref 78.0–100.0)
PLATELETS: 178 10*3/uL (ref 150–400)
RBC: 4.53 MIL/uL (ref 3.87–5.11)
RDW: 17.7 % — AB (ref 11.5–15.5)
WBC: 10.5 10*3/uL (ref 4.0–10.5)

## 2014-04-17 LAB — PROTIME-INR
INR: 2.65 — ABNORMAL HIGH (ref 0.00–1.49)
PROTHROMBIN TIME: 28.3 s — AB (ref 11.6–15.2)

## 2014-04-17 LAB — GLUCOSE, CAPILLARY
Glucose-Capillary: 369 mg/dL — ABNORMAL HIGH (ref 70–99)
Glucose-Capillary: 231 mg/dL — ABNORMAL HIGH (ref 70–99)
Glucose-Capillary: 135 mg/dL — ABNORMAL HIGH (ref 70–99)

## 2014-04-17 MED ORDER — INSULIN ASPART 100 UNIT/ML ~~LOC~~ SOLN
0.0000 [IU] | Freq: Three times a day (TID) | SUBCUTANEOUS | Status: DC
  Administered 2014-04-17: 3 [IU] via SUBCUTANEOUS
  Administered 2014-04-17: 9 [IU] via SUBCUTANEOUS
  Administered 2014-04-18: 2 [IU] via SUBCUTANEOUS
  Administered 2014-04-18: 5 [IU] via SUBCUTANEOUS
  Administered 2014-04-18 – 2014-04-19 (×2): 2 [IU] via SUBCUTANEOUS
  Administered 2014-04-19 (×2): 1 [IU] via SUBCUTANEOUS

## 2014-04-17 MED ORDER — FAMOTIDINE 20 MG PO TABS
40.0000 mg | ORAL_TABLET | Freq: Every day | ORAL | Status: DC
  Administered 2014-04-17 – 2014-04-19 (×3): 40 mg via ORAL
  Filled 2014-04-17 (×3): qty 2

## 2014-04-17 MED ORDER — INSULIN ASPART 100 UNIT/ML ~~LOC~~ SOLN
0.0000 [IU] | Freq: Every day | SUBCUTANEOUS | Status: DC
  Administered 2014-04-18: 2 [IU] via SUBCUTANEOUS

## 2014-04-17 MED ORDER — WARFARIN SODIUM 1 MG PO TABS
1.0000 mg | ORAL_TABLET | Freq: Once | ORAL | Status: AC
  Administered 2014-04-17: 1 mg via ORAL
  Filled 2014-04-17: qty 1

## 2014-04-17 MED ORDER — WARFARIN - PHARMACIST DOSING INPATIENT: Status: DC

## 2014-04-17 MED ORDER — HYDROCODONE-HOMATROPINE 5-1.5 MG/5ML PO SYRP
5.0000 mL | ORAL_SOLUTION | Freq: Three times a day (TID) | ORAL | Status: DC | PRN
  Administered 2014-04-17: 5 mL via ORAL
  Filled 2014-04-17: qty 5

## 2014-04-17 NOTE — Progress Notes (Signed)
Inpatient Diabetes Program Recommendations  AACE/ADA: New Consensus Statement on Inpatient Glycemic Control (2013)  Target Ranges:  Prepandial:   less than 140 mg/dL      Peak postprandial:   less than 180 mg/dL (1-2 hours)      Critically ill patients:  140 - 180 mg/dL   Results for [name], [name] (MRN [id number]) as of 04/17/2014 08:58  Ref. Range 04/13/2014 16:11 04/14/2014 05:30 04/15/2014 05:51 04/16/2014 05:21 04/17/2014 05:19  Glucose Latest Range: 70-99 mg/dL 009 (H) 233 (H) 007 (H) 278 (H) 245 (H)   Diabetes history: No Outpatient Diabetes medications: NA Current orders for Inpatient glycemic control: None  Inpatient Diabetes Program Recommendations Correction (SSI): While inpatient and ordered steroids, please consider ordering CBGs with Novolog correction scale ACHS. HgbA1C: No documented history of diabetes. Please consider ordering an A1C to evaluate glycemic control over the past 2-3 months.  Thanks, Orlando Penner, RN, MSN, CCRN Diabetes Coordinator Inpatient Diabetes Program [phone] (Team Pager) [phone] ([facility] office) [phone] [facility] office)

## 2014-04-17 NOTE — Progress Notes (Signed)
ANTICOAGULATION CONSULT NOTE - follow up  Pharmacy Consult for Warfarin Indication: atrial fibrillation  No Known Allergies  Patient Measurements: Height: 5\' 3"  (160 cm) Weight: 133 lb 3.2 oz (60.419 kg) IBW/kg (Calculated) : 52.4  Vital Signs: Temp: 99.2 F (37.3 C) (09/08 0658) Temp src: Oral (09/08 0658) BP: 131/61 mmHg (09/08 0658) Pulse Rate: 88 (09/08 0658)  Labs:  Recent Labs  04/15/14 0551 04/16/14 0521 04/17/14 0519  HGB  --   --  11.5*  HCT  --   --  35.8*  PLT  --   --  178  LABPROT 28.5* 32.1* 28.3*  INR 2.68* 3.12* 2.65*  CREATININE 1.69* 1.66* 1.27*   Estimated Creatinine Clearance: 28.3 ml/min (by C-G formula based on Cr of 1.27).  Medical History: Past Medical History  Diagnosis Date  . Sinoatrial node dysfunction      SINUS BRADYCARDIA  . Hypertension     unspecified  . Atrial fibrillation   . Encounter for long-term (current) use of other medications     COUMADIN THERAPY  . Lower extremity weakness   . Bilateral renal artery stenosis     MODERATE by dopplers August 2012.  . Sinus bradycardia   . Chronic renal insufficiency   . Coronary artery disease    Medications:  Scheduled:  . albuterol  2.5 mg Nebulization TID  . amLODipine  10 mg Oral Daily  . benzonatate  200 mg Oral TID  . carvedilol  3.125 mg Oral BID WC  . diltiazem  30 mg Oral BID  . donepezil  5 mg Oral QHS  . furosemide  40 mg Intravenous BID  . multivitamin with minerals  1 tablet Oral Daily  . potassium chloride  10 mEq Oral Daily  . predniSONE  40 mg Oral QAC breakfast  . saccharomyces boulardii  250 mg Oral BID  . sodium chloride  3 mL Intravenous Q12H  . vancomycin  125 mg Oral 4 times per day   Assessment: Continuation PTA warfarin for AFIB Home dose reportedly 1mg  on Tu-Th and 2mg  all other days INR is currently at goal.  Goal of Therapy:  INR 2-3 Monitor platelets by anticoagulation protocol: Yes   Plan:  Coumadin 1mg  po today x 1 dose PT/INR  daily   Aarib Pulido A 04/17/2014,8:48 AM

## 2014-04-17 NOTE — Progress Notes (Signed)
[facility] PROGRESS NOTE Interim History: 80 year old woman with history of atrial fibrillation, chronic diastolic heart failure, hospitalized at [facility], discharged 9/4 who then came to ED [facility] same day for coughing for one week, shortness of breath and lower extremity edema. She was admitted for acute respiratory failure with hypoxia, acute on chronic diastolic heart failure, acute renal failure, possible acute bronchitis. She has gradually improved with diuresis and oral steroids.    Filed Weights   04/15/14 0656 04/16/14 0552 04/17/14 0500  Weight: 65.953 kg (145 lb 6.4 oz) 64.728 kg (142 lb 11.2 oz) 60.419 kg (133 lb 3.2 oz)        Intake/Output Summary (Last 24 hours) at 04/17/14 1127 Last data filed at 04/17/14 0830  Gross per 24 hour  Intake    360 ml  Output    400 ml  Net    -40 ml     Assessment/Plan: 1-acute resp failure with hypoxia: due to acute reactive airway disease and acute on chronic diastolic CHF -improving. -no fever and breathing better -will continue low sodium diet, daily weight and strict intake and output -continue IV lasix X 1 more day -follow renal function closely -apply TED hoses -will repeat BNP in am -continue steroids for reactive airway disease and add hycodan for cough control -start flutter valve and wean oxygen off  2-Acute renal failure: secondary to decrease perfusion with CHF -improving with diuresis -will follow renal function closely  3-C. difficile colitis: continue vancomycin orally -continue florastor -no nausea, vomiting, fever or abd pain -diarrhea slowly improving  4-Mitral regurgitation: unchanged. Will need outpatient follow up with cardiology  5-atrial fibrillation: rate controlled -continue coreg, diltiazem and coumadin  6-HLD: continue statins  7-HTN: stable and well controlled -continue current antihypertensive regimen  8-hyperglycemia: w/o hx of diabetes. -will start SSI and check A1C -could be  secondary to steroids use  9-hypernatremia: due to fluid overload with CHF -improved and back to WNL with diuresis   Code Status: Full Family Communication: husband at bedside Disposition Plan: to be determine    Consultants:  None   Procedures: ECHO: 04/09/2014 No wall motion abnormalities, EF 55-60%. Moderate mitral regurgitation and small pericardial effusion.  Antibiotics:  Vancomycin   HPI/Subjective: Feeling better. Still with non-productive cough and afebrile. Breathing has improved and weight is almost at goal. Diarrhea continues and patient is bothered by dry cough.  Objective: Filed Vitals:   04/17/14 0500 04/17/14 0658 04/17/14 0724 04/17/14 1018  BP:  131/61  133/75  Pulse:  88  99  Temp:  99.2 F (37.3 C)    TempSrc:  Oral    Resp:  18    Height:      Weight: 60.419 kg (133 lb 3.2 oz)     SpO2:  98% 92% 95%     Exam:  General: Alert, awake, oriented x 2 (baseline for her due to dementia); in no acute distress. Afebrile.reports feeling better. HEENT: No bruits, no goiter. No JVD Heart: irregular, no rubs or gallops. Positive 2++ lower extremity edema Lungs: fair air movement, scattered rhonchi, no wheezing. Positive bibasilar crackles Abdomen: Soft, nontender, nondistended, positive bowel sounds.  Neuro: Grossly intact, nonfocal.   Data Reviewed: Basic Metabolic Panel:  Recent Labs Lab 04/13/14 1611 04/14/14 0530 04/15/14 0551 04/16/14 0521 04/17/14 0519  NA 129* 134* 137 140 140  K 5.2 4.5 4.3 4.3 4.0  CL 92* 94* 98 98 98  CO2 20 24 26 29  34*  GLUCOSE 195* 178* 240* 278*  245*  BUN 65* 66* 62* 59* 46*  CREATININE 1.88* 1.84* 1.69* 1.66* 1.27*  CALCIUM 9.2 9.1 8.8 8.9 8.7   CBC:  Recent Labs Lab 04/13/14 1611 04/14/14 0530 04/17/14 0519  WBC 7.9 5.3 10.5  NEUTROABS 7.4  --   --   HGB 11.3* 11.0* 11.5*  HCT 35.0* 34.0* 35.8*  MCV 78.3 77.6* 79.0  PLT 208 181 178   Cardiac Enzymes:  Recent Labs Lab 04/13/14 1938    TROPONINI <0.30   BNP (last 3 results)  Recent Labs  04/13/14 1611  PROBNP 13309.0*    Recent Results (from the past 240 hour(s))  CLOSTRIDIUM DIFFICILE BY PCR     Status: Abnormal   Collection Time    04/13/14 11:47 PM      Result Value Ref Range Status   C difficile by pcr POSITIVE (*) NEGATIVE Final   Comment: CRITICAL RESULT CALLED TO, READ BACK BY AND VERIFIED WITH:     BULLOCK,T @ 0055 ON 04/14/14 BY WOODIE,J     Studies: No results found.  Scheduled Meds: . albuterol  2.5 mg Nebulization TID  . amLODipine  10 mg Oral Daily  . benzonatate  200 mg Oral TID  . carvedilol  3.125 mg Oral BID WC  . diltiazem  30 mg Oral BID  . donepezil  5 mg Oral QHS  . furosemide  40 mg Intravenous BID  . insulin aspart  0-5 Units Subcutaneous QHS  . insulin aspart  0-9 Units Subcutaneous TID WC  . multivitamin with minerals  1 tablet Oral Daily  . potassium chloride  10 mEq Oral Daily  . predniSONE  40 mg Oral QAC breakfast  . saccharomyces boulardii  250 mg Oral BID  . sodium chloride  3 mL Intravenous Q12H  . vancomycin  125 mg Oral 4 times per day  . warfarin  1 mg Oral Once  . Warfarin - Pharmacist Dosing Inpatient   Does not apply Q24H   Continuous Infusions:   Time < 30 minutes  Vassie Loll  [facility] Pager [phone]. If 8PM-8AM, please contact night-coverage at [email or web address], password [facility] 04/17/2014, 11:27 AM  LOS: 4 days     **Disclaimer: This note may have been dictated with voice recognition software. Similar sounding words can inadvertently be transcribed and this note may contain transcription errors which may not have been corrected upon publication of note.**

## 2014-04-18 DIAGNOSIS — E119 Type 2 diabetes mellitus without complications: Secondary | ICD-10-CM

## 2014-04-18 DIAGNOSIS — I10 Essential (primary) hypertension: Secondary | ICD-10-CM

## 2014-04-18 LAB — BASIC METABOLIC PANEL
Anion gap: 9 (ref 5–15)
BUN: 37 mg/dL — ABNORMAL HIGH (ref 6–23)
CO2: 38 meq/L — AB (ref 19–32)
Calcium: 8.6 mg/dL (ref 8.4–10.5)
Chloride: 96 mEq/L (ref 96–112)
Creatinine, Ser: 1.12 mg/dL — ABNORMAL HIGH (ref 0.50–1.10)
GFR calc Af Amer: 52 mL/min — ABNORMAL LOW (ref >90)
GFR, EST NON AFRICAN AMERICAN: 44 mL/min — AB (ref >90)
Glucose, Bld: 149 mg/dL — ABNORMAL HIGH (ref 70–99)
POTASSIUM: 4.2 meq/L (ref 3.7–5.3)
SODIUM: 143 meq/L (ref 137–147)

## 2014-04-18 LAB — PRO B NATRIURETIC PEPTIDE: Pro B Natriuretic peptide (BNP): 6636 pg/mL — ABNORMAL HIGH (ref 0–450)

## 2014-04-18 LAB — PROTIME-INR
INR: 2.05 — AB (ref 0.00–1.49)
Prothrombin Time: 23.1 seconds — ABNORMAL HIGH (ref 11.6–15.2)

## 2014-04-18 LAB — GLUCOSE, CAPILLARY
GLUCOSE-CAPILLARY: 153 mg/dL — AB (ref 70–99)
GLUCOSE-CAPILLARY: 248 mg/dL — AB (ref 70–99)
Glucose-Capillary: 184 mg/dL — ABNORMAL HIGH (ref 70–99)
Glucose-Capillary: 261 mg/dL — ABNORMAL HIGH (ref 70–99)

## 2014-04-18 MED ORDER — FUROSEMIDE 40 MG PO TABS
40.0000 mg | ORAL_TABLET | Freq: Every day | ORAL | Status: DC
  Administered 2014-04-18 – 2014-04-19 (×2): 40 mg via ORAL
  Filled 2014-04-18 (×2): qty 1

## 2014-04-18 MED ORDER — WARFARIN SODIUM 2 MG PO TABS
2.0000 mg | ORAL_TABLET | Freq: Once | ORAL | Status: AC
  Administered 2014-04-18: 2 mg via ORAL
  Filled 2014-04-18: qty 1

## 2014-04-18 NOTE — Progress Notes (Signed)
Guafensin/cough medicine(narcotic) was placed in pixis by Driscilla Moats, pharmacy tech witnessed by Rayetta Pigg, RN.  [facility] would not allow nurse to waste or return med.

## 2014-04-18 NOTE — Progress Notes (Signed)
[facility] PROGRESS NOTE Interim History: 80 year old woman with history of atrial fibrillation, chronic diastolic heart failure, hospitalized at [facility], discharged 9/4 who then came to ED [facility] same day for coughing for one week, shortness of breath and lower extremity edema. She was admitted for acute respiratory failure with hypoxia, acute on chronic diastolic heart failure, acute renal failure, possible acute bronchitis. She has gradually improved with diuresis and oral steroids.    Filed Weights   04/16/14 0552 04/17/14 0500 04/18/14 0500  Weight: 64.728 kg (142 lb 11.2 oz) 60.419 kg (133 lb 3.2 oz) 60.328 kg (133 lb)        Intake/Output Summary (Last 24 hours) at 04/18/14 1639 Last data filed at 04/18/14 1551  Gross per 24 hour  Intake    483 ml  Output   2301 ml  Net  -1818 ml     Assessment/Plan: 1-acute resp failure with hypoxia: due to acute reactive airway disease and acute on chronic diastolic CHF -improving. -no fever and breathing better -will continue low sodium diet, daily weight and strict intake and output -will change to lasix 40mg  daily; follow urine output and renal function -follow renal function closely -continue TED hoses -BNP down 6000 -today is last dose of steroids for reactive airway disease; will continue PRN hycodan for cough control -continue flutter valve and continue to wean oxygen off  2-Acute renal failure: secondary to decrease perfusion with CHF -improving with diuresis; Cr 1.12 -will follow renal function closely  3-C. difficile colitis: continue vancomycin orally -continue florastor -no nausea, vomiting, fever or abd pain -diarrhea to improve slowly  4-Mitral regurgitation: unchanged. Will need outpatient follow up with cardiology for further rec's  5-atrial fibrillation: rate controlled -continue coreg, diltiazem and coumadin  6-HLD: continue statins  7-HTN: stable and well controlled -continue current  antihypertensive regimen  8-hyperglycemia: wit A1C of 7.1; positive for type diabetes mellitus -patient will required to be on oral hypoglycemic therapy at discharge -will use amaryl -low carb diet has been recommended -while inpatient will continue SSI  9-hypernatremia: due to fluid overload with CHF -improved and back to WNL with diuresis   Code Status: Full Family Communication: husband at bedside Disposition Plan: to be determine    Consultants:  None   Procedures: ECHO: 04/09/2014 No wall motion abnormalities, EF 55-60%. Moderate mitral regurgitation and small pericardial effusion.  Antibiotics:  Vancomycin orally   HPI/Subjective: Feeling better and breathing easier. LE swelling significantly improved. Patient is coughing less and denies CP. No fever.  Objective: Filed Vitals:   04/18/14 0834 04/18/14 0838 04/18/14 1409 04/18/14 1506  BP:    122/58  Pulse:  90  82  Temp:    99.2 F (37.3 C)  TempSrc:    Oral  Resp:  18  18  Height:      Weight:      SpO2: 90%  90% 100%     Exam:  General: Alert, awake, oriented x 2 (baseline for her due to dementia); in no acute distress. Afebrile.reports feeling better. HEENT: No bruits, no goiter. No JVD Heart: irregular, no rubs or gallops. Positive 1+ lower extremity edema Lungs: improved air movement, scattered rhonchi, no wheezing. Mild bibasilar crackles Abdomen: Soft, nontender, nondistended, positive bowel sounds.  Neuro: Grossly intact, nonfocal.   Data Reviewed: Basic Metabolic Panel:  Recent Labs Lab 04/14/14 0530 04/15/14 0551 04/16/14 0521 04/17/14 0519 04/18/14 0519  NA 134* 137 140 140 143  K 4.5 4.3 4.3 4.0 4.2  CL 94*  98 98 98 96  CO2 34* 38*  GLUCOSE 178* 240* 278* 245* 149*  BUN 66* 62* 59* 46* 37*  CREATININE 1.84* 1.69* 1.66* 1.27* 1.12*  CALCIUM 9.1 8.8 8.9 8.7 8.6   CBC:  Recent Labs Lab 04/13/14 1611 04/14/14 0530 04/17/14 0519  WBC 7.9 5.3 10.5  NEUTROABS 7.4   --   --   HGB 11.3* 11.0* 11.5*  HCT 35.0* 34.0* 35.8*  MCV 78.3 77.6* 79.0  PLT 208 181 178   Cardiac Enzymes:  Recent Labs Lab 04/13/14 1938  TROPONINI <0.30   BNP (last 3 results)  Recent Labs  04/13/14 1611 04/18/14 0519  PROBNP 13309.0* 6636.0*    Recent Results (from the past 240 hour(s))  CLOSTRIDIUM DIFFICILE BY PCR     Status: Abnormal   Collection Time    04/13/14 11:47 PM      Result Value Ref Range Status   C difficile by pcr POSITIVE (*) NEGATIVE Final   Comment: CRITICAL RESULT CALLED TO, READ BACK BY AND VERIFIED WITH:     BULLOCK,T @ 0055 ON 04/14/14 BY WOODIE,J     Studies: No results found.  Scheduled Meds: . albuterol  2.5 mg Nebulization TID  . amLODipine  10 mg Oral Daily  . benzonatate  200 mg Oral TID  . carvedilol  3.125 mg Oral BID WC  . diltiazem  30 mg Oral BID  . donepezil  5 mg Oral QHS  . famotidine  40 mg Oral Daily  . furosemide  40 mg Oral Daily  . insulin aspart  0-5 Units Subcutaneous QHS  . insulin aspart  0-9 Units Subcutaneous TID WC  . multivitamin with minerals  1 tablet Oral Daily  . potassium chloride  10 mEq Oral Daily  . saccharomyces boulardii  250 mg Oral BID  . sodium chloride  3 mL Intravenous Q12H  . vancomycin  125 mg Oral 4 times per day  . Warfarin - Pharmacist Dosing Inpatient   Does not apply Q24H   Continuous Infusions:   Time < 30 minutes  Vassie Loll  [facility] Pager [phone]. If 8PM-8AM, please contact night-coverage at [email or web address], password [facility] 04/18/2014, 4:39 PM  LOS: 5 days     **Disclaimer: This note may have been dictated with voice recognition software. Similar sounding words can inadvertently be transcribed and this note may contain transcription errors which may not have been corrected upon publication of note.**

## 2014-04-18 NOTE — Progress Notes (Signed)
Inpatient Diabetes Program Recommendations  AACE/ADA: New Consensus Statement on Inpatient Glycemic Control (2013)  Target Ranges:  Prepandial:   less than 140 mg/dL      Peak postprandial:   less than 180 mg/dL (1-2 hours)      Critically ill patients:  140 - 180 mg/dL   Results for [name], [name] (MRN [id number]) as of 04/18/2014 10:52  Ref. Range 04/17/2014 05:15  Hemoglobin A1C Latest Range: <5.7 % 7.1 (H)   Diabetes history: No Outpatient Diabetes medications: NA Current orders for Inpatient glycemic control: Novolog 0-9 units AC, Novolog 0-5 units HS  Inpatient Diabetes Program Recommendations HgbA1C: A1C 7.1% on 04/17/14 which meets ADA criteria for DM dx. MD, please make notation if patient will be diagnosed with diabetes at this time so patient can be educated prior to discharge.  Thanks, Orlando Penner, RN, MSN, CCRN Diabetes Coordinator Inpatient Diabetes Program [phone] (Team Pager) [phone] ([place] office) [phone] [facility] office)

## 2014-04-18 NOTE — Progress Notes (Signed)
ANTICOAGULATION CONSULT NOTE - follow up  Pharmacy Consult for Warfarin Indication: atrial fibrillation  No Known Allergies  Patient Measurements: Height: 5\' 3"  (160 cm) Weight: 133 lb (60.328 kg) IBW/kg (Calculated) : 52.4  Vital Signs: Temp: 98.5 F (36.9 C) (09/09 0619) Temp src: Oral (09/09 0619) BP: 132/73 mmHg (09/09 0619) Pulse Rate: 90 (09/09 0838)  Labs:  Recent Labs  04/16/14 0521 04/17/14 0519 04/18/14 0519  HGB  --  11.5*  --   HCT  --  35.8*  --   PLT  --  178  --   LABPROT 32.1* 28.3* 23.1*  INR 3.12* 2.65* 2.05*  CREATININE 1.66* 1.27* 1.12*   Estimated Creatinine Clearance: 32 ml/min (by C-G formula based on Cr of 1.12).  Medical History: Past Medical History  Diagnosis Date  . Sinoatrial node dysfunction      SINUS BRADYCARDIA  . Hypertension     unspecified  . Atrial fibrillation   . Encounter for long-term (current) use of other medications     COUMADIN THERAPY  . Lower extremity weakness   . Bilateral renal artery stenosis     MODERATE by dopplers August 2012.  . Sinus bradycardia   . Chronic renal insufficiency   . Coronary artery disease    Medications:  Medications Prior to Admission  Medication Sig Dispense Refill  . ALPRAZolam (XANAX) 0.5 MG tablet Take 0.25 mg by mouth at bedtime as needed for anxiety.      Marland Kitchen amLODipine (NORVASC) 10 MG tablet Take 10 mg by mouth daily.      . Biotin 5000 MCG TABS Take 1 tablet by mouth daily.       . Calcium Carbonate-Vitamin D (CALTRATE 600+D PO) Take 1 tablet by mouth 2 (two) times daily.       . carvedilol (COREG) 6.25 MG tablet Take 0.5 tablets (3.125 mg total) by mouth 2 (two) times daily.      . Cyanocobalamin (VITAMIN B 12 PO) Take 1 tablet by mouth daily.       Marland Kitchen diltiazem (CARDIZEM) 30 MG tablet Take 1 tablet (30 mg total) by mouth 2 (two) times daily.  60 tablet  6  . donepezil (ARICEPT) 5 MG tablet Take 5 mg by mouth at bedtime.       . furosemide (LASIX) 20 MG tablet Take 1 tablet  (20 mg total) by mouth every other day.      Marland Kitchen HYDROcodone-acetaminophen (NORCO) 7.5-325 MG per tablet Take 1 tablet by mouth every 8 (eight) hours as needed for moderate pain or severe pain.      . Multiple Vitamin (MULTIVITAMIN) tablet Take 1 tablet by mouth 2 (two) times daily.       . potassium chloride (K-DUR) 10 MEQ tablet Take 1 tablet (10 mEq total) by mouth daily.  30 tablet  6  . saccharomyces boulardii (FLORASTOR) 250 MG capsule Take 1 capsule (250 mg total) by mouth 2 (two) times daily.  60 capsule  1  . vancomycin (VANCOCIN) 125 MG capsule Take 1 capsule (125 mg total) by mouth 4 (four) times daily.  56 capsule  0  . warfarin (COUMADIN) 2 MG tablet Take 1-2 mg by mouth every evening. Takes one half tablet (1mg  total) on Tuesdays and Thursdays, then takes one tablet (2mg  total) on all other days      . cephALEXin (KEFLEX) 500 MG capsule Take 1 capsule (500 mg total) by mouth 4 (four) times daily.  28 capsule  0   Assessment:  84 yoF on chronic Coumadin for Afib.  Home dose listed above.  INR therapeutic on admission.  No bleeding noted.   Goal of Therapy:  INR 2-3   Plan:  Coumadin  po today per home regimen PT/INR daily   Elson Clan 04/18/2014,9:18 AM

## 2014-04-19 LAB — BASIC METABOLIC PANEL
ANION GAP: 6 (ref 5–15)
BUN: 33 mg/dL — AB (ref 6–23)
CO2: 40 mEq/L (ref 19–32)
Calcium: 8.4 mg/dL (ref 8.4–10.5)
Chloride: 95 mEq/L — ABNORMAL LOW (ref 96–112)
Creatinine, Ser: 1.08 mg/dL (ref 0.50–1.10)
GFR, EST AFRICAN AMERICAN: 54 mL/min — AB (ref >90)
GFR, EST NON AFRICAN AMERICAN: 46 mL/min — AB (ref >90)
Glucose, Bld: 119 mg/dL — ABNORMAL HIGH (ref 70–99)
POTASSIUM: 4.1 meq/L (ref 3.7–5.3)
Sodium: 141 mEq/L (ref 137–147)

## 2014-04-19 LAB — GLUCOSE, CAPILLARY
GLUCOSE-CAPILLARY: 122 mg/dL — AB (ref 70–99)
Glucose-Capillary: 176 mg/dL — ABNORMAL HIGH (ref 70–99)
Glucose-Capillary: 127 mg/dL — ABNORMAL HIGH (ref 70–99)

## 2014-04-19 LAB — CBC
HCT: 35.8 % — ABNORMAL LOW (ref 36.0–46.0)
HEMOGLOBIN: 11.2 g/dL — AB (ref 12.0–15.0)
MCH: 25.2 pg — ABNORMAL LOW (ref 26.0–34.0)
MCHC: 31.3 g/dL (ref 30.0–36.0)
MCV: 80.6 fL (ref 78.0–100.0)
Platelets: 155 10*3/uL (ref 150–400)
RBC: 4.44 MIL/uL (ref 3.87–5.11)
RDW: 17.6 % — ABNORMAL HIGH (ref 11.5–15.5)
WBC: 13.4 10*3/uL — AB (ref 4.0–10.5)

## 2014-04-19 LAB — PROTIME-INR
INR: 1.67 — AB (ref 0.00–1.49)
PROTHROMBIN TIME: 19.7 s — AB (ref 11.6–15.2)

## 2014-04-19 MED ORDER — GLIMEPIRIDE 1 MG PO TABS: 1.0000 mg | ORAL_TABLET | Freq: Every day | ORAL | Status: DC

## 2014-04-19 MED ORDER — WARFARIN SODIUM 2 MG PO TABS
2.0000 mg | ORAL_TABLET | Freq: Once | ORAL | Status: AC
  Administered 2014-04-19: 2 mg via ORAL
  Filled 2014-04-19: qty 1

## 2014-04-19 MED ORDER — ACETAZOLAMIDE 250 MG PO TABS
500.0000 mg | ORAL_TABLET | Freq: Two times a day (BID) | ORAL | Status: DC
  Administered 2014-04-19: 500 mg via ORAL
  Filled 2014-04-19 (×3): qty 2

## 2014-04-19 MED ORDER — ACETAZOLAMIDE 250 MG PO TABS: 500.0000 mg | ORAL_TABLET | Freq: Two times a day (BID) | ORAL | Status: DC

## 2014-04-19 MED ORDER — VANCOMYCIN 50 MG/ML ORAL SOLUTION: 125.0000 mg | Freq: Four times a day (QID) | ORAL | Status: AC

## 2014-04-19 MED ORDER — FAMOTIDINE 40 MG PO TABS: 40.0000 mg | ORAL_TABLET | Freq: Every day | ORAL | Status: DC

## 2014-04-19 MED ORDER — FUROSEMIDE 40 MG PO TABS
40.0000 mg | ORAL_TABLET | Freq: Every day | ORAL | Status: DC
Start: 2014-04-19 — End: 2014-05-10

## 2014-04-19 MED ORDER — HYDROCODONE-HOMATROPINE 5-1.5 MG/5ML PO SYRP: 5.0000 mL | ORAL_SOLUTION | Freq: Four times a day (QID) | ORAL | Status: DC | PRN

## 2014-04-19 MED ORDER — IPRATROPIUM-ALBUTEROL 18-103 MCG/ACT IN AERO: 2.0000 | INHALATION_SPRAY | Freq: Four times a day (QID) | RESPIRATORY_TRACT | Status: DC | PRN

## 2014-04-19 MED ORDER — POTASSIUM CHLORIDE ER 10 MEQ PO TBCR: 20.0000 meq | EXTENDED_RELEASE_TABLET | Freq: Every day | ORAL | Status: DC

## 2014-04-19 MED ORDER — LIVING WELL WITH DIABETES BOOK
Freq: Once | Status: AC
  Administered 2014-04-19: 09:00:00
  Filled 2014-04-19: qty 1

## 2014-04-19 NOTE — Progress Notes (Signed)
ANTICOAGULATION CONSULT NOTE - follow up  Pharmacy Consult for Warfarin Indication: atrial fibrillation  No Known Allergies  Patient Measurements: Height:  (160 cm) Weight: 134 lb 11.2 oz (61.1 kg) IBW/kg (Calculated) : 52.4  Vital Signs: Temp: 98.6 F (37 C) (09/10 0600) Temp src: Oral (09/10 0600) BP: 119/58 mmHg (09/10 0600) Pulse Rate: 96 (09/10 0600)  Labs:  Recent Labs  04/17/14 0519 04/18/14 0519 04/19/14 0540  HGB 11.5*  --  11.2*  HCT 35.8*  --  35.8*  PLT 178  --  155  LABPROT 28.3* 23.1* 19.7*  INR 2.65* 2.05* 1.67*  CREATININE 1.27* 1.12*  --    Estimated Creatinine Clearance: 32 ml/min (by C-G formula based on Cr of 1.12).  Medical History: Past Medical History  Diagnosis Date  . Sinoatrial node dysfunction      SINUS BRADYCARDIA  . Hypertension     unspecified  . Atrial fibrillation   . Encounter for long-term (current) use of other medications     COUMADIN THERAPY  . Lower extremity weakness   . Bilateral renal artery stenosis     MODERATE by dopplers August 2012.  . Sinus bradycardia   . Chronic renal insufficiency   . Coronary artery disease    Medications:  Medications Prior to Admission  Medication Sig Dispense Refill  . ALPRAZolam (XANAX) 0.5 MG tablet Take 0.25 mg by mouth at bedtime as needed for anxiety.      Marland Kitchen amLODipine (NORVASC) 10 MG tablet Take 10 mg by mouth daily.      . Biotin 5000 MCG TABS Take 1 tablet by mouth daily.       . Calcium Carbonate-Vitamin D (CALTRATE 600+D PO) Take 1 tablet by mouth 2 (two) times daily.       . carvedilol (COREG) 6.25 MG tablet Take 0.5 tablets (3.125 mg total) by mouth 2 (two) times daily.      . Cyanocobalamin (VITAMIN B 12 PO) Take 1 tablet by mouth daily.       Marland Kitchen diltiazem (CARDIZEM) 30 MG tablet Take 1 tablet (30 mg total) by mouth 2 (two) times daily.  60 tablet  6  . donepezil (ARICEPT) 5 MG tablet Take 5 mg by mouth at bedtime.       . furosemide (LASIX) 20 MG tablet Take 1  tablet (20 mg total) by mouth every other day.      Marland Kitchen HYDROcodone-acetaminophen (NORCO) 7.5-325 MG per tablet Take 1 tablet by mouth every 8 (eight) hours as needed for moderate pain or severe pain.      . Multiple Vitamin (MULTIVITAMIN) tablet Take 1 tablet by mouth 2 (two) times daily.       . potassium chloride (K-DUR) 10 MEQ tablet Take 1 tablet (10 mEq total) by mouth daily.  30 tablet  6  . saccharomyces boulardii (FLORASTOR) 250 MG capsule Take 1 capsule (250 mg total) by mouth 2 (two) times daily.  60 capsule  1  . vancomycin (VANCOCIN) 125 MG capsule Take 1 capsule (125 mg total) by mouth 4 (four) times daily.  56 capsule  0  . warfarin (COUMADIN) 2 MG tablet Take 1-2 mg by mouth every evening. Takes one half tablet (  total) on Tuesdays and Thursdays, then takes one tablet (  total) on all other days      . cephALEXin (KEFLEX) 500 MG capsule Take 1 capsule (500 mg total) by mouth 4 (four) times daily.  28 capsule  0   Assessment: 84 yoF  on chronic Coumadin for Afib.  Home dose listed above.   INR therapeutic on admission, but is now below desired goal range on home regimen.  Dose was omitted 9/7 for INR>3.   No bleeding noted.   Goal of Therapy:  INR 2-3   Plan:  Coumadin 2mg  po today x1 PT/INR daily   Angie Piercey, [facility] 04/19/2014,7:47 AM

## 2014-04-19 NOTE — Discharge Instructions (Signed)

## 2014-04-19 NOTE — Progress Notes (Signed)
[name] was on room air sitting in a chair. O2 sat 95% at rest ambulated briefly in halls and returned to room O2 sat was 94% with HR 104 RR 24. After additional 5 minutes O2 sat 96% at rest Hr 90 B/P 130/59  RR 20

## 2014-04-19 NOTE — Progress Notes (Signed)
Spoke with patient and her sister about new diabetes diagnosis. Discussed A1C results (7.1% on 04/17/14) and explained what an A1C is, basic pathophysiology of DM Type 2, basic home care, importance of checking CBGs and maintaining good CBG control to prevent long-term and short-term complications. Patient reports that she is followed by Dr. Sherril Croon and that she was on Metformin at one time and was instructed to stop the Metformin due to her A1C being improved at the time. According to the patient "it has been quite a while" since she was on Metformin. Reviewed Living Well with Diabetes booklet with the patient in detail. Dr. Ottie Glazier is planning to discharge patient on Amaryl so discussed medication with the patient and informed about potential for hypoglycemia with the medication.  Reviewed signs and symptoms of hyperglycemia and hypoglycemia along with treatment for both. Patient will need a prescription for a glucometer and testing supplies at time of discharge. Encouraged patient to check her glucose at least 2 times per day, before driving, and anytime she felt symptomatic for high or low blood glucose. Discussed impact of nutrition, exercise, stress, sickness, and medications on diabetes control.  Discussed carbohydrates, carbohydrate goals per day and meal, along with portion sizes.   RNs to provide ongoing basic DM education at bedside with this patient and engage patient to actively check blood glucose. Have ordered educational booklet, RD consult, and DM videos.  Patient verbalized understanding of information discussed and she states that she has no further questions at this time related to diabetes.   Thanks, Orlando Penner, RN, MSN, CCRN Diabetes Coordinator Inpatient Diabetes Program [phone] (Team Pager) [phone] ([place] office) [phone] [facility] office)

## 2014-04-19 NOTE — Discharge Summary (Signed)
Physician Discharge Summary  [name] ZOX:[id number] DOB: 1929/02/14 DOA: 04/13/2014  PCP: Ignatius Specking., MD  Admit date: 04/13/2014 Discharge date: 04/19/2014  Time spent: >30 minutes  Recommendations for Outpatient Follow-up:  1. Reassess volume status and weight fluctuation; adjust diuretic regimen accordingly 2. Check BMET to follow electrolytes and renal function 3. Follow up with cardiology for further evaluation and recommendations on heart failure and mitral regurgitation, if needed 4. Close follow up to CBG's and diabetes. Started on Amaryl; A1C 7.1; will need further adjustments to hypoglycemic regimen. BNP    Component Value Date/Time   PROBNP 6636.0* 04/18/2014 0519   Filed Weights   04/17/14 0500 04/18/14 0500 04/19/14 0700  Weight: 60.419 kg (133 lb 3.2 oz) 60.328 kg (133 lb) 61.1 kg (134 lb 11.2 oz)     Discharge Diagnoses:  Principal Problem:   Acute respiratory failure with hypoxia Active Problems:   HYPERTENSION, UNSPECIFIED   Atrial fibrillation   Dilated cardiomyopathy   Mitral regurgitation   Chronic anticoagulation   Acute on chronic diastolic heart failure   Chronic diastolic heart failure   C. difficile colitis   Chronic respiratory failure with hypoxia   Acute renal failure   Discharge Condition: stable and improved, discharge with HH-RN as part of HF pathway protocol.   Diet recommendation: heart healthy (low sodium diet) and low carbohydrates.  History of present illness:  80 y.o. female with hx of afib on chronic anticoagulation, chroinc diastolic CHF, MR, HTN, CAD, bilateral renal stenosis with CKD, admitted to [facility] for CHF, discharged today, presents to the ER as she was having persistent coughs and shortness of breath. She had developed C diff, and has been on oral vancomycin. She no longer has diarrhea. She has no orthopnea, but was having DOE, and frequent coughs. Evalatuion in the ER included a chest CT which showed  bilateral pleural effusions with compressive atelectasis, and moderate pericardial effusion. Her Cr is 1.88, and her troponin was negative. Her EKG showed afib with rate control, and some electrical alternans. She has been on prednisone for about a week from her PCP, but was never diagnosed with COPD or asthma.   Hospital Course:  1-acute resp failure with hypoxia: due to acute reactive airway disease and acute on chronic diastolic CHF  -no fever and breathing a lot better  -will continue low sodium diet and daily weight  -will change to lasix  daily; follow electrolytes and weight fluctuation  -follow renal function closely  -will recommend PRN TED hoses use as an outpatient, especially standing for long periods of time -BNP down 6000 range at discharge. -patient completed last dose of steroids for reactive airway disease while inpatient; will continue PRN hycodan for cough control and PRN combivent -encourage to continue using flutter valve -no oxygen supplementation required   2-Acute renal failure: secondary to decrease perfusion with CHF  -improved with diuresis; Cr 1.08 at discharge -recommend close follow of renal function especially with higher lasix dose.  3-C. difficile colitis: continue vancomycin orally. Plan is to treat for 14 days. At discharge 9 more days pending. -continue florastor  -no nausea, vomiting, fever or abd pain. Able p -diarrhea continue to improve slowly  -avoiding PPI's while treating C. Diff.  4-Mitral regurgitation: stable and unchanged. Will need outpatient follow up with cardiology for further rec's   5-atrial fibrillation: rate controlled  -continue coreg, diltiazem and coumadin   6-HLD: continue statins   7-HTN: stable and well controlled  -continue current antihypertensive  regimen   8-hyperglycemia: wit A1C of 7.1; positive for type diabetes mellitus type 2. Controlled.  -patient will required to be on oral hypoglycemic therapy at discharge    -will use amaryl  -low carb diet has been recommended  -close follow up and further adjustments to be done by PCP  9-hyponatremia: due to fluid overload with CHF  -improved and back to WNL with diuresis   10-elevated CO2: due to volume contraction and alkalosis with diuresis. -will treat with 3 days of diamox. -follow BMET and electrolytes in 1 week  Procedures: 2-D echo: 04/09/14 No wall motion abnormalities, EF 55-60%. Moderate mitral regurgitation and small pericardial effusion.  Consultations:  None   Discharge Exam: Filed Vitals:   04/19/14 1406  BP: 133/62  Pulse: 95  Temp: 98.1 F (36.7 C)  Resp: 18   General: Alert, awake, oriented x 2 (baseline for her due to dementia); in no acute distress. Afebrile. Reports feeling better and ready to go home. HEENT: No bruits, no goiter. No JVD  Heart: irregular, no rubs or gallops. Positive trace to 1+ lower extremity edema bilaterally Lungs: improved air movement, scattered rhonchi, no wheezing and no crackles  Abdomen: Soft, nontender, nondistended, positive bowel sounds.  Neuro: Grossly intact, nonfocal.  Discharge Instructions  Discharge Instructions   Diet - low sodium heart healthy    Complete by:  As directed      Discharge instructions    Complete by:  As directed   Take medications as prescribed Check your weight on daily basis (contact PCP for increase weight > 3 pounds overnight or > 5 pounds in 1 week) Please follow a low sodium diet (less than 2 grams daily) Please follow a low carbohydrates diet Arrange follow up with PCP in 7-10 days            Medication List    STOP taking these medications       cephALEXin 500 MG capsule  Commonly known as:  KEFLEX      TAKE these medications       acetaZOLAMIDE 250 MG tablet  Commonly known as:  DIAMOX  Take 2 tablets (500 mg total) by mouth 2 (two) times daily.     albuterol-ipratropium 18-103 MCG/ACT inhaler  Commonly known as:  COMBIVENT  Inhale 2  puffs into the lungs every 6 (six) hours as needed for wheezing or shortness of breath.     ALPRAZolam 0.5 MG tablet  Commonly known as:  XANAX  Take 0.25 mg by mouth at bedtime as needed for anxiety.     amLODipine 10 MG tablet  Commonly known as:  NORVASC  Take 10 mg by mouth daily.     Biotin 5000 MCG Tabs  Take 1 tablet by mouth daily.     CALTRATE 600+D PO  Take 1 tablet by mouth 2 (two) times daily.     carvedilol 6.25 MG tablet  Commonly known as:  COREG  Take 0.5 tablets (3.125 mg total) by mouth 2 (two) times daily.     diltiazem 30 MG tablet  Commonly known as:  CARDIZEM  Take 1 tablet (30 mg total) by mouth 2 (two) times daily.     donepezil 5 MG tablet  Commonly known as:  ARICEPT  Take 5 mg by mouth at bedtime.     famotidine 40 MG tablet  Commonly known as:  PEPCID  Take 1 tablet (40 mg total) by mouth daily.     furosemide 40 MG tablet  Commonly known as:  LASIX  Take 1 tablet (40 mg total) by mouth daily.     glimepiride 1 MG tablet  Commonly known as:  AMARYL  Take 1 tablet (1 mg total) by mouth daily with breakfast.     HYDROcodone-acetaminophen 7.5-325 MG per tablet  Commonly known as:  NORCO  Take 1 tablet by mouth every 8 (eight) hours as needed for moderate pain or severe pain.     HYDROcodone-homatropine 5-1.5 MG/5ML syrup  Commonly known as:  HYCODAN  Take 5 mLs by mouth every 6 (six) hours as needed for cough.     multivitamin tablet  Take 1 tablet by mouth 2 (two) times daily.     potassium chloride 10 MEQ tablet  Commonly known as:  K-DUR  Take 2 tablets (20 mEq total) by mouth daily.     saccharomyces boulardii 250 MG capsule  Commonly known as:  FLORASTOR  Take 1 capsule (250 mg total) by mouth 2 (two) times daily.     vancomycin 50 mg/mL oral solution  Commonly known as:  VANCOCIN  Take 2.5 mLs (125 mg total) by mouth every 6 (six) hours.     VITAMIN B 12 PO  Take 1 tablet by mouth daily.     warfarin 2 MG tablet    Commonly known as:  COUMADIN  Take 1-2 mg by mouth every evening. Takes one half tablet (  total) on Tuesdays and Thursdays, then takes one tablet (  total) on all other days       No Known Allergies     Follow-up Information   Follow up with VYAS,DHRUV B., MD. Schedule an appointment as soon as possible for a visit in 1 week.   Specialty:  Internal Medicine   Contact information:   [address] [place] [address] [phone]       The results of significant diagnostics from this hospitalization (including imaging, microbiology, ancillary and laboratory) are listed below for reference.    Significant Diagnostic Studies: Dg Chest 2 View  04/13/2014   CLINICAL DATA:  Lower leg edema, shortness of breath cough and weakness  EXAM: CHEST  2 VIEW  COMPARISON:  PA and lateral chest x-ray of April 09, 2014  FINDINGS: There is persistent volume loss at the left lung base with further obscuration of the hemidiaphragm. There is a small right pleural effusion slightly more conspicuous than on the previous study. The lungs are adequately inflated. There is no focal infiltrate. The cardiopericardial silhouette is mildly enlarged. The pulmonary vascularity is not clearly engorged. The bony thorax is unremarkable.  IMPRESSION: Persistently increased density at the left lung base is consistent with atelectasis or pneumonia as well as small pleural effusion. There is no definite pulmonary edema.   Electronically Signed   By: David  [place]   On: 04/13/2014 16:53   Ct Head Wo Contrast  04/03/2014   CLINICAL DATA:  Fatigue, headache, bilateral leg weakness  EXAM: CT HEAD WITHOUT CONTRAST  TECHNIQUE: Contiguous axial images were obtained from the base of the skull through the vertex without intravenous contrast.  COMPARISON:  06/09/2012  FINDINGS: No evidence of parenchymal hemorrhage or extra-axial fluid collection. No mass lesion, mass effect, or midline shift.  No CT evidence of acute infarction.   Subcortical white matter and periventricular small vessel ischemic changes. Intracranial atherosclerosis.  Global cortical atrophy.  No ventriculomegaly.  The visualized paranasal sinuses are essentially clear. The mastoid air cells are unopacified.  No evidence of calvarial fracture.  IMPRESSION: No evidence of acute intracranial abnormality.  Atrophy with small vessel ischemic changes and intracranial atherosclerosis.   Electronically Signed   By: Charline Bills M.D.   On: 04/03/2014 16:46   Ct Chest Wo Contrast  04/13/2014   CLINICAL DATA:  Reported left lung abnormality on prior exam, leg swelling, atrial fibrillation, dilated cardiomyopathy  EXAM: CT CHEST WITHOUT CONTRAST  TECHNIQUE: Multidetector CT imaging of the chest was performed following the standard protocol without IV contrast.  COMPARISON:  04/13/2014, 04/09/2014  FINDINGS: Bilateral small pleural effusions are noted with associated compressive atelectasis, left greater than right lower lobe. Mild central bronchial wall thickening is noted. Central airways are patent. No pulmonary nodule or mass.  Heart size is mildly enlarged. Moderate pericardial effusion is present. Moderate atheromatous aortic calcification without aneurysm. Small mediastinal nodes are identified, largest in the pretracheal region measuring 0.6 cm image 18. Imaging of the upper abdomen is degraded by respiratory motion. Great vessels are normal in caliber. Remote left lateral second, third, fourth, fifth, sixth rib fractures are identified. No acute osseous abnormality. T4 compression deformity reidentified.  IMPRESSION: Small bilateral pleural effusions and associated compressive atelectasis, left greater than right, accounting for the findings on prior CT. Early pneumonia could appear similar, however.  Mild cardiomegaly with moderate pericardial effusion.   Electronically Signed   By: Christiana Pellant M.D.   On: 04/13/2014 18:03   Dg Chest Portable 1 View  04/03/2014    CLINICAL DATA:  Difficulty breathing and fatigue  EXAM: PORTABLE CHEST - 1 VIEW  COMPARISON:  Jan 06, 2013  FINDINGS: There is left base consolidation. There is mild right base atelectatic change. Lungs elsewhere clear. Heart is upper normal in size with pulmonary vascularity within normal limits. There is atherosclerotic change in the aorta. No adenopathy.  IMPRESSION: Left base consolidation.  Mild right base atelectatic change.   Electronically Signed   By: Bretta Bang M.D.   On: 04/03/2014 16:03    Microbiology: Recent Results (from the past 240 hour(s))  CLOSTRIDIUM DIFFICILE BY PCR     Status: Abnormal   Collection Time    04/13/14 11:47 PM      Result Value Ref Range Status   C difficile by pcr POSITIVE (*) NEGATIVE Final   Comment: CRITICAL RESULT CALLED TO, READ BACK BY AND VERIFIED WITH:     BULLOCK,T @ 0055 ON 04/14/14 BY WOODIE,J     Labs: Basic Metabolic Panel:  Recent Labs Lab 04/15/14 0551 04/16/14 0521 04/17/14 0519 04/18/14 0519 04/19/14 0536  NA 137 140 140 143 141  K 4.3 4.3 4.0 4.2 4.1  CL 98 98 98 96 95*  CO2 26 29 34* 38* 40*  GLUCOSE 240* 278* 245* 149* 119*  BUN 62* 59* 46* 37* 33*  CREATININE 1.69* 1.66* 1.27* 1.12* 1.08  CALCIUM 8.8 8.9 8.7 8.6 8.4   CBC:  Recent Labs Lab 04/13/14 1611 04/14/14 0530 04/17/14 0519 04/19/14 0540  WBC 7.9 5.3 10.5 13.4*  NEUTROABS 7.4  --   --   --   HGB 11.3* 11.0* 11.5* 11.2*  HCT 35.0* 34.0* 35.8* 35.8*  MCV 78.3 77.6* 79.0 80.6  PLT 208 181 178 155   Cardiac Enzymes:  Recent Labs Lab 04/13/14 1938  TROPONINI <0.30   BNP: BNP (last 3 results)  Recent Labs  04/13/14 1611 04/18/14 0519  PROBNP 13309.0* 6636.0*   CBG:  Recent Labs Lab 04/18/14 1138 04/18/14 1625 04/18/14 2121 04/19/14 0737 04/19/14 1137  GLUCAP 184* 261*  248* 122* 176*    Signed:  Vassie Loll  [facility] 04/19/2014, 3:05 PM   **Disclaimer: This note may have been dictated with voice recognition  software. Similar sounding words can inadvertently be transcribed and this note may contain transcription errors which may not have been corrected upon publication of note.**

## 2014-05-03 ENCOUNTER — Encounter: Payer: Self-pay | Admitting: Cardiovascular Disease

## 2014-05-03 ENCOUNTER — Ambulatory Visit (INDEPENDENT_AMBULATORY_CARE_PROVIDER_SITE_OTHER): Payer: Medicare Other | Admitting: Cardiovascular Disease

## 2014-05-03 VITALS — BP 126/72 | HR 99 | Ht 63.0 in | Wt 122.0 lb

## 2014-05-03 DIAGNOSIS — I4891 Unspecified atrial fibrillation: Secondary | ICD-10-CM

## 2014-05-03 DIAGNOSIS — E1165 Type 2 diabetes mellitus with hyperglycemia: Secondary | ICD-10-CM

## 2014-05-03 DIAGNOSIS — Z5181 Encounter for therapeutic drug level monitoring: Secondary | ICD-10-CM

## 2014-05-03 DIAGNOSIS — I340 Nonrheumatic mitral (valve) insufficiency: Secondary | ICD-10-CM

## 2014-05-03 DIAGNOSIS — I10 Essential (primary) hypertension: Secondary | ICD-10-CM

## 2014-05-03 DIAGNOSIS — A0472 Enterocolitis due to Clostridium difficile, not specified as recurrent: Secondary | ICD-10-CM

## 2014-05-03 DIAGNOSIS — N184 Chronic kidney disease, stage 4 (severe): Secondary | ICD-10-CM

## 2014-05-03 DIAGNOSIS — Z7901 Long term (current) use of anticoagulants: Secondary | ICD-10-CM

## 2014-05-03 DIAGNOSIS — E119 Type 2 diabetes mellitus without complications: Secondary | ICD-10-CM

## 2014-05-03 DIAGNOSIS — R2681 Unsteadiness on feet: Secondary | ICD-10-CM

## 2014-05-03 DIAGNOSIS — I5032 Chronic diastolic (congestive) heart failure: Secondary | ICD-10-CM

## 2014-05-03 DIAGNOSIS — R609 Edema, unspecified: Secondary | ICD-10-CM

## 2014-05-03 DIAGNOSIS — I482 Chronic atrial fibrillation, unspecified: Secondary | ICD-10-CM

## 2014-05-03 DIAGNOSIS — R269 Unspecified abnormalities of gait and mobility: Secondary | ICD-10-CM

## 2014-05-03 DIAGNOSIS — Z9189 Other specified personal risk factors, not elsewhere classified: Secondary | ICD-10-CM

## 2014-05-03 DIAGNOSIS — I059 Rheumatic mitral valve disease, unspecified: Secondary | ICD-10-CM

## 2014-05-03 DIAGNOSIS — I701 Atherosclerosis of renal artery: Secondary | ICD-10-CM

## 2014-05-03 DIAGNOSIS — Z9289 Personal history of other medical treatment: Secondary | ICD-10-CM

## 2014-05-03 MED ORDER — AMLODIPINE BESYLATE 10 MG PO TABS: 10.0000 mg | ORAL_TABLET | Freq: Every day | ORAL | Status: DC

## 2014-05-03 MED ORDER — DILTIAZEM HCL 30 MG PO TABS: 30.0000 mg | ORAL_TABLET | Freq: Two times a day (BID) | ORAL | Status: DC

## 2014-05-03 MED ORDER — OMEPRAZOLE 40 MG PO CPDR: 40.0000 mg | DELAYED_RELEASE_CAPSULE | Freq: Every day | ORAL | Status: DC

## 2014-05-03 NOTE — Progress Notes (Signed)
Patient ID: [name], female   DOB: Aug 03, 1929, 80 y.o.   MRN: [id number]      SUBJECTIVE: The patient was recently hospitalized for acute hypoxic respiratory failure secondary to reactive airway disease and acute on chronic diastolic heart failure. Echocardiogram on 04/09/2014 demonstrated normal left ventricular systolic function, EF 55-60%, moderate mitral regurgitation, mild tricuspid regurgitation, mild left atrial enlargement and a small pericardial effusion. Chest CT 04/13/14: Small bilateral pleural effusions and associated compressive atelectasis. She also has a history of essential HTN, atrial fibrillation on chronic warfarin therapy, CKD, PAD, and right renal artery stenosis (1-59% in 04/2013). She says, "This is actually the best I've felt in a long time". Her leg swelling has markedly improved and she denies palpitations, chest pain and shortness of breath. She has been out of diltiazem for the past week. She hasn't had her INR checked recently.    Review of Systems: As per "subjective", otherwise negative.  No Known Allergies  Current Outpatient Prescriptions  Medication Sig Dispense Refill  . albuterol-ipratropium (COMBIVENT) 18-103 MCG/ACT inhaler Inhale 2 puffs into the lungs every 6 (six) hours as needed for wheezing or shortness of breath.  1 Inhaler  2  . ALPRAZolam (XANAX) 0.5 MG tablet Take 0.25 mg by mouth at bedtime as needed for anxiety.      Marland Kitchen amLODipine (NORVASC) 10 MG tablet Take 10 mg by mouth daily.      . Biotin 5000 MCG TABS Take 1 tablet by mouth daily.       . Calcium Carbonate-Vitamin D (CALTRATE 600+D PO) Take 1 tablet by mouth 2 (two) times daily.       . carvedilol (COREG) 6.25 MG tablet Take 0.5 tablets (3.125 mg total) by mouth 2 (two) times daily.      . Cyanocobalamin (VITAMIN B 12 PO) Take 1 tablet by mouth daily.       Marland Kitchen diltiazem (CARDIZEM) 30 MG tablet Take 1 tablet (30 mg total) by mouth 2 (two) times daily.  60 tablet  6  . donepezil  (ARICEPT) 5 MG tablet Take 5 mg by mouth at bedtime.       . famotidine (PEPCID) 40 MG tablet Take 1 tablet (40 mg total) by mouth daily.  30 tablet  1  . furosemide (LASIX) 40 MG tablet Take 1 tablet (40 mg total) by mouth daily.  30 tablet  1  . glimepiride (AMARYL) 1 MG tablet Take 1 tablet (1 mg total) by mouth daily with breakfast.  30 tablet  1  . HYDROcodone-acetaminophen (NORCO) 7.5-325 MG per tablet Take 1 tablet by mouth every 8 (eight) hours as needed for moderate pain or severe pain.      Marland Kitchen HYDROcodone-homatropine (HYCODAN) 5-1.5 MG/5ML syrup Take 5 mLs by mouth every 6 (six) hours as needed for cough.  180 mL  0  . Multiple Vitamin (MULTIVITAMIN) tablet Take 1 tablet by mouth 2 (two) times daily.       . potassium chloride (K-DUR) 10 MEQ tablet Take 2 tablets (20 mEq total) by mouth daily.  60 tablet  2  . saccharomyces boulardii (FLORASTOR) 250 MG capsule Take 1 capsule (250 mg total) by mouth 2 (two) times daily.  60 capsule  1  . warfarin (COUMADIN) 2 MG tablet Take 1-2 mg by mouth every evening. Takes one half tablet (  total) on Tuesdays and Thursdays, then takes one tablet (  total) on all other days      . acetaZOLAMIDE (DIAMOX) 250 MG tablet Take  2 tablets (500 mg total) by mouth 2 (two) times daily.  6 tablet  0   No current facility-administered medications for this visit.    Past Medical History  Diagnosis Date  . Sinoatrial node dysfunction      SINUS BRADYCARDIA  . Hypertension     unspecified  . Atrial fibrillation   . Encounter for long-term (current) use of other medications     COUMADIN THERAPY  . Lower extremity weakness   . Bilateral renal artery stenosis     MODERATE by dopplers August 2012.  . Sinus bradycardia   . Chronic renal insufficiency   . Coronary artery disease     Past Surgical History  Procedure Laterality Date  . Knee arthroscopy    . Cardioversion    . Colonoscopy  05/2011    Dr. Allena Katz: normal. no bx.    History   Social  History  . Marital Status: Married    Spouse Name: [name]    Number of Children: 0  . Years of Education: 12   Occupational History  . RETIRED   .     Social History Main Topics  . Smoking status: Never Smoker   . Smokeless tobacco: Never Used     Comment: Never smoked  . Alcohol Use: No  . Drug Use: No  . Sexual Activity: Not on file   Other Topics Concern  . Not on file   Social History Narrative   Patient lives at home with her husband [name]) Patient is retired. Patient has 12 th grade education.   Right handed.   Caffeine- two three cups of coffee daily.     Filed Vitals:   05/03/14 1055  BP: 126/72  Pulse: 99  Height: 5\' 3"  (1.6 m)  Weight: 122 lb (55.339 kg)  SpO2: 99%    PHYSICAL EXAM General: NAD  Neck: No JVD, no thyromegaly.  Lungs: Clear to auscultation bilaterally with normal respiratory effort.  CV: Nondisplaced PMI. Irregular rhythm, high normal rate, normal S1/S2, no S3, II/VI pansystolic murmur along left sternal border. Trace pretibial and periankle edema b/l. Wearing compression stockings. Abdomen: Soft, nontender, no hepatosplenomegaly, no distention.  Neurologic: Alert and oriented x 3.  Psych: Normal affect.  Skin: Normal. Musculoskeletal: Normal range of motion, no gross deformities. Extremities: No clubbing or cyanosis.   ECG: Most recent ECG reviewed.      ASSESSMENT AND PLAN:  Essential HTN Normal today. She has unilateral renal artery stenosis. Previously d/c prazosin. Will closely monitor given her propensity for high BP.   Peripheral edema  She has had exacerbations of chronic kidney disease in the past, and has right renal artery stenosis. Weight is stable at 122 lbs. On Lasix 40 mg daily and acetazolamide. Will check BMET.  Chronic renal insufficiency/Right renal artery stenosis  Repeat renal Dopplers at next visit. Cr 1.08 on 9/10, BUN 33.  Atrial fibrillation  She is in atrial fibrillation. She is on Coreg, and I had  previously reduced it to 3.125 mg bid (HR 99 bpm today) and has been off of diltiazem as it ran out. EF 55-60% in 03/2014. I will fill prescription for short-acting diltiazem 30 mg bid. On Coumadin for anticoagulation, followed here in our clinic. Given abnormal TFT's, I have since discontinued amiodarone. Will check INR today.  Chronic diastolic heart failure  Weight is down to 122 lbs. On Lasix 40 mg daily. BNP 6636 on 04/18/2014.  Gait instability  I previously made a referral to neurology,  and she is now being managed by Dr. Terrace Arabia.   Chronic diarrhea  Diagnosed with C. Diff colitis and taking Flagyl.  Type 2 diabetes On glimepiride. HbA1c 7.1% on 04/17/2014.  Dispo: f/u 3 months.   Time spent: 40 minutes, of which >50% spent reviewing hospital testing and counseling patient with respect to heart failure and atrial fibrillation.   Prentice Docker, M.D., F.A.C.C.

## 2014-05-03 NOTE — Patient Instructions (Signed)
   Lab for BMET - due in 1 week  Office will contact with results via phone or letter.   Remain on the Diltiazem & Amlodipine Continue all other medications.   [facility] primary MD list provided Follow up in  3 months

## 2014-05-04 ENCOUNTER — Telehealth: Payer: Self-pay | Admitting: *Deleted

## 2014-05-04 ENCOUNTER — Ambulatory Visit (INDEPENDENT_AMBULATORY_CARE_PROVIDER_SITE_OTHER): Payer: Medicare Other | Admitting: *Deleted

## 2014-05-04 DIAGNOSIS — Z5181 Encounter for therapeutic drug level monitoring: Secondary | ICD-10-CM

## 2014-05-04 DIAGNOSIS — I4891 Unspecified atrial fibrillation: Secondary | ICD-10-CM

## 2014-05-04 DIAGNOSIS — Z7901 Long term (current) use of anticoagulants: Secondary | ICD-10-CM

## 2014-05-04 LAB — POCT INR: INR: 1.5

## 2014-05-04 NOTE — Telephone Encounter (Signed)
See coumadin note. 

## 2014-05-04 NOTE — Telephone Encounter (Signed)
Pt is calling for coumadin directions

## 2014-05-07 ENCOUNTER — Ambulatory Visit (INDEPENDENT_AMBULATORY_CARE_PROVIDER_SITE_OTHER): Payer: Medicare Other | Admitting: Gastroenterology

## 2014-05-07 ENCOUNTER — Encounter: Payer: Self-pay | Admitting: Gastroenterology

## 2014-05-07 ENCOUNTER — Other Ambulatory Visit: Payer: Self-pay | Admitting: Cardiovascular Disease

## 2014-05-07 VITALS — BP 118/68 | HR 76 | Temp 97.9°F | Ht 64.0 in | Wt 123.0 lb

## 2014-05-07 DIAGNOSIS — I701 Atherosclerosis of renal artery: Secondary | ICD-10-CM

## 2014-05-07 DIAGNOSIS — A0472 Enterocolitis due to Clostridium difficile, not specified as recurrent: Secondary | ICD-10-CM

## 2014-05-07 NOTE — Progress Notes (Signed)
Referring Provider: Ignatius Specking., MD Primary Care Physician:  Ignatius Specking., MD  Chief Complaint  Patient presents with  . Diarrhea    x a couple of months    HPI:   [name] presents today with history of Cdiff, +PCR in Aug 2015 and treated with Flagyl. However, no complete abatement of symptoms on Flagyl. Started on Vancomycin. Completed vancomycin yesterday. Stool still loose. Down to 2 loose stools per day. Frequency actually improved. Was having at least 3 loose stools previously.. No rectal bleeding. No abdominal pain.   Past Medical History  Diagnosis Date  . Sinoatrial node dysfunction      SINUS BRADYCARDIA  . Hypertension     unspecified  . Atrial fibrillation   . Encounter for long-term (current) use of other medications     COUMADIN THERAPY  . Lower extremity weakness   . Bilateral renal artery stenosis     MODERATE by dopplers August 2012.  . Sinus bradycardia   . Chronic renal insufficiency   . Coronary artery disease     Past Surgical History  Procedure Laterality Date  . Knee arthroscopy    . Cardioversion    . Colonoscopy  05/2011    Dr. Allena Katz: normal. no bx.    Current Outpatient Prescriptions  Medication Sig Dispense Refill  . albuterol-ipratropium (COMBIVENT) 18-103 MCG/ACT inhaler Inhale 2 puffs into the lungs every 6 (six) hours as needed for wheezing or shortness of breath.  1 Inhaler  2  . ALPRAZolam (XANAX) 0.5 MG tablet Take 0.25 mg by mouth at bedtime as needed for anxiety.      Marland Kitchen amLODipine (NORVASC) 10 MG tablet Take 1 tablet (10 mg total) by mouth daily.  30 tablet  6  . Biotin 5000 MCG TABS Take 1 tablet by mouth daily.       . Calcium Carbonate-Vitamin D (CALTRATE 600+D PO) Take 1 tablet by mouth 2 (two) times daily.       . carvedilol (COREG) 6.25 MG tablet Take 0.5 tablets (3.125 mg total) by mouth 2 (two) times daily.      Marland Kitchen diltiazem (CARDIZEM) 30 MG tablet Take 1 tablet (30 mg total) by mouth 2 (two) times daily.   60 tablet  6  . donepezil (ARICEPT) 5 MG tablet Take 5 mg by mouth at bedtime.       Marland Kitchen glimepiride (AMARYL) 1 MG tablet Take 1 tablet (1 mg total) by mouth daily with breakfast.  30 tablet  1  . HYDROcodone-acetaminophen (NORCO) 7.5-325 MG per tablet Take 1 tablet by mouth every 8 (eight) hours as needed for moderate pain or severe pain.      Marland Kitchen HYDROcodone-homatropine (HYCODAN) 5-1.5 MG/5ML syrup Take 5 mLs by mouth every 6 (six) hours as needed for cough.  180 mL  0  . Multiple Vitamin (MULTIVITAMIN) tablet Take 1 tablet by mouth 2 (two) times daily.       Marland Kitchen omeprazole (PRILOSEC) 40 MG capsule Take 1 capsule (40 mg total) by mouth daily.  30 capsule  3  . potassium chloride (K-DUR) 10 MEQ tablet Take 2 tablets (20 mEq total) by mouth daily.  60 tablet  2  . saccharomyces boulardii (FLORASTOR) 250 MG capsule Take 1 capsule (250 mg total) by mouth 2 (two) times daily.  60 capsule  1  . warfarin (COUMADIN) 2 MG tablet Take 1-2 mg by mouth every evening. Takes one half tablet (1mg  total) on Tuesdays and Thursdays, then  takes one tablet (  total) on all other days      . furosemide (LASIX) 20 MG tablet Take 1 tablet (20 mg total) by mouth daily.  90 tablet  3   No current facility-administered medications for this visit.    Allergies as of 05/07/2014  . (No Known Allergies)    Family History  Problem Relation Age of Onset  . Cancer Brother   . Heart failure Mother   . Cirrhosis Other   . High blood pressure Mother     History   Social History  . Marital Status: Married    Spouse Name: [name]    Number of Children: 0  . Years of Education: 12   Occupational History  . RETIRED   .     Social History Main Topics  . Smoking status: Never Smoker   . Smokeless tobacco: Never Used     Comment: Never smoked  . Alcohol Use: No  . Drug Use: No  . Sexual Activity: None   Other Topics Concern  . None   Social History Narrative   Patient lives at home with her husband [name])  Patient is retired. Patient has 12 th grade education.   Right handed.   Caffeine- two three cups of coffee daily.    Review of Systems: As mentioned in HPI.  Physical Exam: BP 118/68  Pulse 76  Temp(Src) 97.9 F (36.6 C) (Oral)  Ht  (1.626 m)  Wt 123 lb (55.792 kg)  BMI 21.10 kg/m2 General:   Alert and oriented. No distress noted. Pleasant and cooperative.  Head:  Normocephalic and atraumatic. Eyes:  Conjuctiva clear without scleral icterus. Mouth:  Oral mucosa pink and moist. Good dentition. No lesions. Abdomen:  +BS, soft, non-tender and non-distended. No rebound or guarding. No HSM or masses noted. Msk:  Symmetrical without gross deformities. Normal posture. Extremities:  Without edema. Neurologic:  Alert and  oriented x4;  grossly normal neurologically. Psych:  Alert and cooperative. Normal mood and affect.

## 2014-05-07 NOTE — Patient Instructions (Signed)
Stop Omeprazole (Prilosec) until we get the results from the stool sample.  Please complete the stool sample as soon as possible.  Use Boost, Ensure, or similar protein supplement for mid-morning and late-afternoon snack.  Further recommendations to follow.

## 2014-05-08 LAB — BASIC METABOLIC PANEL
BUN: 28 mg/dL — ABNORMAL HIGH (ref 6–23)
CO2: 26 mEq/L (ref 19–32)
Calcium: 9.5 mg/dL (ref 8.4–10.5)
Chloride: 107 mEq/L (ref 96–112)
Creat: 1.64 mg/dL — ABNORMAL HIGH (ref 0.50–1.10)
Glucose, Bld: 58 mg/dL — ABNORMAL LOW (ref 70–99)
POTASSIUM: 5.2 meq/L (ref 3.5–5.3)
Sodium: 143 mEq/L (ref 135–145)

## 2014-05-09 ENCOUNTER — Telehealth: Payer: Self-pay | Admitting: *Deleted

## 2014-05-09 ENCOUNTER — Ambulatory Visit (INDEPENDENT_AMBULATORY_CARE_PROVIDER_SITE_OTHER): Payer: Medicare Other | Admitting: *Deleted

## 2014-05-09 DIAGNOSIS — N289 Disorder of kidney and ureter, unspecified: Secondary | ICD-10-CM

## 2014-05-09 DIAGNOSIS — Z5181 Encounter for therapeutic drug level monitoring: Secondary | ICD-10-CM

## 2014-05-09 DIAGNOSIS — I4891 Unspecified atrial fibrillation: Secondary | ICD-10-CM

## 2014-05-09 LAB — POCT INR: INR: 1.8

## 2014-05-09 LAB — CLOSTRIDIUM DIFFICILE BY PCR: CDIFFPCR: NOT DETECTED

## 2014-05-09 NOTE — Telephone Encounter (Signed)
See coumadin note. 

## 2014-05-09 NOTE — Telephone Encounter (Signed)
Kendal Hymen called to give PT INR Results PT= 1.8 INR= 21.8  Kendal Hymen will like for a call back regarding next steps for patient.

## 2014-05-09 NOTE — Telephone Encounter (Signed)
Message copied by Eustace Moore on Wed May 09, 2014  8:56 AM ------      Message from: Eustace Moore      Created: Wed May 09, 2014  7:03 AM                   ----- Message -----         From: Nori Riis, RN         Sent: 05/08/2014   5:05 PM           To: Eustace Moore, LPN            [facility] pt       ----- Message -----         From: Laqueta Linden, MD         Sent: 05/08/2014   4:50 PM           To: Nori Riis, RN            Given renal dysfunction, would reduce Lasix to 20 mg daily and repeat BMET in 10 days.       ------

## 2014-05-10 MED ORDER — FUROSEMIDE 20 MG PO TABS: 20.0000 mg | ORAL_TABLET | Freq: Every day | ORAL | Status: DC

## 2014-05-10 NOTE — Telephone Encounter (Signed)
Patient informed. 

## 2014-05-10 NOTE — Progress Notes (Signed)
Quick Note:  Good news!  Cdiff negative. I would like her to continue a probiotic daily. Monitor stools. IF any worsening, contact us immediately. ______

## 2014-05-10 NOTE — Telephone Encounter (Signed)
Message copied by Eustace Moore on Thu May 10, 2014 10:42 AM ------      Message from: Eustace Moore      Created: Wed May 09, 2014  7:03 AM                   ----- Message -----         From: Nori Riis, RN         Sent: 05/08/2014   5:05 PM           To: Eustace Moore, LPN            [facility] pt       ----- Message -----         From: Laqueta Linden, MD         Sent: 05/08/2014   4:50 PM           To: Nori Riis, RN            Given renal dysfunction, would reduce Lasix to 20 mg daily and repeat BMET in 10 days.       ------

## 2014-05-11 ENCOUNTER — Telehealth: Payer: Self-pay | Admitting: *Deleted

## 2014-05-11 DIAGNOSIS — A0472 Enterocolitis due to Clostridium difficile, not specified as recurrent: Secondary | ICD-10-CM | POA: Insufficient documentation

## 2014-05-11 NOTE — Telephone Encounter (Signed)
When was this message from.  Looks like this is the INR result I took care of on 05/09/14.  Next INR is not due till 05/16/14.  Please let me know.  Misty Stanley

## 2014-05-11 NOTE — Telephone Encounter (Signed)
INR 1.8 PT 21.8

## 2014-05-11 NOTE — Assessment & Plan Note (Signed)
Initial onset with positive PCR in Aug 2015 with Flagyl failure, improvement with Vancomycin thereafter. Just complete vanc, with improved frequency of stools but still loose. Need to check Cdiff PCR now. If positive, needs higher dose of vanc with tapering. As of note, last colonoscopy in 2012 at outside facility. If persistent diarrhea but negative Cdiff PCR, consider updated colonoscopy with random colonic biopsies.

## 2014-05-14 NOTE — Progress Notes (Signed)
cc'ed to pcp °

## 2014-05-15 ENCOUNTER — Ambulatory Visit (INDEPENDENT_AMBULATORY_CARE_PROVIDER_SITE_OTHER): Payer: Medicare Other | Admitting: Pharmacist Clinician (PhC)/ Clinical Pharmacy Specialist

## 2014-05-15 DIAGNOSIS — Z7901 Long term (current) use of anticoagulants: Secondary | ICD-10-CM

## 2014-05-15 DIAGNOSIS — I4891 Unspecified atrial fibrillation: Secondary | ICD-10-CM

## 2014-05-15 DIAGNOSIS — Z5181 Encounter for therapeutic drug level monitoring: Secondary | ICD-10-CM

## 2014-05-15 LAB — POCT INR: INR: 1.8

## 2014-05-16 ENCOUNTER — Other Ambulatory Visit: Payer: Self-pay | Admitting: Cardiovascular Disease

## 2014-05-17 ENCOUNTER — Telehealth: Payer: Self-pay | Admitting: *Deleted

## 2014-05-17 ENCOUNTER — Telehealth: Payer: Self-pay | Admitting: Cardiovascular Disease

## 2014-05-17 DIAGNOSIS — N289 Disorder of kidney and ureter, unspecified: Secondary | ICD-10-CM

## 2014-05-17 DIAGNOSIS — I10 Essential (primary) hypertension: Secondary | ICD-10-CM

## 2014-05-17 DIAGNOSIS — N189 Chronic kidney disease, unspecified: Secondary | ICD-10-CM

## 2014-05-17 LAB — BASIC METABOLIC PANEL
BUN: 30 mg/dL — ABNORMAL HIGH (ref 6–23)
CHLORIDE: 103 meq/L (ref 96–112)
CO2: 22 meq/L (ref 19–32)
Calcium: 8.9 mg/dL (ref 8.4–10.5)
Creat: 1.47 mg/dL — ABNORMAL HIGH (ref 0.50–1.10)
Glucose, Bld: 151 mg/dL — ABNORMAL HIGH (ref 70–99)
Potassium: 4.4 mEq/L (ref 3.5–5.3)
SODIUM: 137 meq/L (ref 135–145)

## 2014-05-17 MED ORDER — FUROSEMIDE 20 MG PO TABS: ORAL_TABLET | ORAL | Status: DC

## 2014-05-17 NOTE — Telephone Encounter (Signed)
Notes Recorded by Lesle Chris, LPN on 83/10/3830 at 9:59 AM Husband [name]) notified. Will mail lab order to home.

## 2014-05-17 NOTE — Telephone Encounter (Signed)
Message copied by Lesle Chris on Thu May 17, 2014 10:01 AM ------      Message from: Prentice Docker A      Created: Thu May 17, 2014  8:26 AM       Would recommend taking Lasix 40 mg and 20 mg on alternate days, and repeating BMET in 3-4 weeks. ------

## 2014-05-18 NOTE — Telephone Encounter (Signed)
Spoke with Kendal Hymen. She was unaware pt's INR was checked on 10/6 and dose changed.  Updated pt's information with her and gave orders to recheck on 10/20.

## 2014-06-05 ENCOUNTER — Other Ambulatory Visit: Payer: Self-pay | Admitting: Cardiovascular Disease

## 2014-06-06 ENCOUNTER — Telehealth: Payer: Self-pay | Admitting: *Deleted

## 2014-06-06 LAB — BASIC METABOLIC PANEL
BUN: 28 mg/dL — AB (ref 6–23)
CHLORIDE: 104 meq/L (ref 96–112)
CO2: 24 mEq/L (ref 19–32)
CREATININE: 1.37 mg/dL — AB (ref 0.50–1.10)
Calcium: 9.4 mg/dL (ref 8.4–10.5)
Glucose, Bld: 209 mg/dL — ABNORMAL HIGH (ref 70–99)
Potassium: 4.8 mEq/L (ref 3.5–5.3)
Sodium: 140 mEq/L (ref 135–145)

## 2014-06-06 NOTE — Telephone Encounter (Signed)
Message copied by Eustace Moore on Wed Jun 06, 2014  2:49 PM ------      Message from: Eustace Moore      Created: Wed Jun 06, 2014 10:01 AM                   ----- Message -----         From: Laqueta Linden, MD         Sent: 06/06/2014   9:58 AM           To: Nori Riis, RN            Improving. Continue current therapy. ------

## 2014-06-06 NOTE — Telephone Encounter (Signed)
Patient's husband informed

## 2014-06-06 NOTE — Telephone Encounter (Signed)
Message copied by Eustace Moore on Wed Jun 06, 2014  2:33 PM ------      Message from: Eustace Moore      Created: Wed Jun 06, 2014 10:01 AM                   ----- Message -----         From: Laqueta Linden, MD         Sent: 06/06/2014   9:58 AM           To: Nori Riis, RN            Improving. Continue current therapy. ------

## 2014-06-22 ENCOUNTER — Encounter: Payer: Self-pay | Admitting: Cardiovascular Disease

## 2014-06-22 ENCOUNTER — Ambulatory Visit (INDEPENDENT_AMBULATORY_CARE_PROVIDER_SITE_OTHER): Payer: Medicare Other | Admitting: *Deleted

## 2014-06-22 ENCOUNTER — Ambulatory Visit (INDEPENDENT_AMBULATORY_CARE_PROVIDER_SITE_OTHER): Payer: Medicare Other | Admitting: Cardiovascular Disease

## 2014-06-22 VITALS — BP 113/58 | HR 80 | Ht 64.0 in | Wt 121.0 lb

## 2014-06-22 DIAGNOSIS — E1165 Type 2 diabetes mellitus with hyperglycemia: Secondary | ICD-10-CM

## 2014-06-22 DIAGNOSIS — I10 Essential (primary) hypertension: Secondary | ICD-10-CM

## 2014-06-22 DIAGNOSIS — I4891 Unspecified atrial fibrillation: Secondary | ICD-10-CM

## 2014-06-22 DIAGNOSIS — I482 Chronic atrial fibrillation, unspecified: Secondary | ICD-10-CM

## 2014-06-22 DIAGNOSIS — Z7901 Long term (current) use of anticoagulants: Secondary | ICD-10-CM

## 2014-06-22 DIAGNOSIS — I5032 Chronic diastolic (congestive) heart failure: Secondary | ICD-10-CM

## 2014-06-22 DIAGNOSIS — N189 Chronic kidney disease, unspecified: Secondary | ICD-10-CM

## 2014-06-22 DIAGNOSIS — Z5181 Encounter for therapeutic drug level monitoring: Secondary | ICD-10-CM

## 2014-06-22 DIAGNOSIS — R609 Edema, unspecified: Secondary | ICD-10-CM

## 2014-06-22 DIAGNOSIS — I701 Atherosclerosis of renal artery: Secondary | ICD-10-CM

## 2014-06-22 LAB — POCT INR: INR: 2.3

## 2014-06-22 NOTE — Progress Notes (Signed)
Patient ID: [name], female   DOB: 1928/11/08, 80 y.o.   MRN: [id number]      SUBJECTIVE: The patient presents for follow-up of atrial fibrillation, chronic diastolic heart failure, and hypertension in the context of chronic kidney disease. 10/27: BUN 28, SCr 1.37 10/7:   BUN 30, SCr 1.47 Wt stable 121 lbs.  Her appetite is somewhat poor. She denies chest pain, shortness of breath, and palpitations. Her leg swelling has been primarily controlled with compression stockings and does take Lasix 40 mg and 20 mg on alternate days. She has not had her INR checked recently, but no showed for a 10/20 anticoagulation appt.  She had one nosebleed a few weeks back, and had nasal cauterization several years ago.  Review of Systems: As per "subjective", otherwise negative.  No Known Allergies  Current Outpatient Prescriptions  Medication Sig Dispense Refill  . albuterol-ipratropium (COMBIVENT) 18-103 MCG/ACT inhaler Inhale 2 puffs into the lungs every 6 (six) hours as needed for wheezing or shortness of breath. 1 Inhaler 2  . ALPRAZolam (XANAX) 0.5 MG tablet Take 0.25 mg by mouth at bedtime as needed for anxiety.    Marland Kitchen amLODipine (NORVASC) 10 MG tablet Take 1 tablet (10 mg total) by mouth daily. 30 tablet 6  . Biotin 5000 MCG TABS Take 1 tablet by mouth daily.     . Calcium Carbonate-Vitamin D (CALTRATE 600+D PO) Take 1 tablet by mouth 2 (two) times daily.     . carvedilol (COREG) 6.25 MG tablet Take 0.5 tablets (3.125 mg total) by mouth 2 (two) times daily.    Marland Kitchen diltiazem (CARDIZEM) 30 MG tablet Take 1 tablet (30 mg total) by mouth 2 (two) times daily. 60 tablet 6  . donepezil (ARICEPT) 5 MG tablet Take 5 mg by mouth at bedtime.     . furosemide (LASIX) 20 MG tablet Take 20mg  alternating with 40mg  every other day    . HYDROcodone-acetaminophen (NORCO) 7.5-325 MG per tablet Take 1 tablet by mouth every 8 (eight) hours as needed for moderate pain or severe pain.    Marland Kitchen  HYDROcodone-homatropine (HYCODAN) 5-1.5 MG/5ML syrup Take 5 mLs by mouth every 6 (six) hours as needed for cough. 180 mL 0  . Multiple Vitamin (MULTIVITAMIN) tablet Take 1 tablet by mouth 2 (two) times daily.     Marland Kitchen omeprazole (PRILOSEC) 40 MG capsule Take 1 capsule (40 mg total) by mouth daily. 30 capsule 3  . potassium chloride (K-DUR) 10 MEQ tablet Take 2 tablets (20 mEq total) by mouth daily. 60 tablet 2  . saccharomyces boulardii (FLORASTOR) 250 MG capsule Take 1 capsule (250 mg total) by mouth 2 (two) times daily. 60 capsule 1  . warfarin (COUMADIN) 2 MG tablet Take 1-2 mg by mouth every evening. Takes one half tablet (1mg  total) on Tuesdays and Thursdays, then takes one tablet (2mg  total) on all other days     No current facility-administered medications for this visit.    Past Medical History  Diagnosis Date  . Sinoatrial node dysfunction      SINUS BRADYCARDIA  . Hypertension     unspecified  . Atrial fibrillation   . Encounter for long-term (current) use of other medications     COUMADIN THERAPY  . Lower extremity weakness   . Bilateral renal artery stenosis     MODERATE by dopplers August 2012.  . Sinus bradycardia   . Chronic renal insufficiency   . Coronary artery disease     Past Surgical History  Procedure Laterality Date  . Knee arthroscopy    . Cardioversion    . Colonoscopy  05/2011    Dr. Allena KatzPatel: normal. no bx.    History   Social History  . Marital Status: Married    Spouse Name: [name]    Number of Children: 0  . Years of Education: 12   Occupational History  . RETIRED   .     Social History Main Topics  . Smoking status: Never Smoker   . Smokeless tobacco: Never Used     Comment: Never smoked  . Alcohol Use: No  . Drug Use: No  . Sexual Activity: Not on file   Other Topics Concern  . Not on file   Social History Narrative   Patient lives at home with her husband [name]([name]) Patient is retired. Patient has 12 th grade education.   Right  handed.   Caffeine- two three cups of coffee daily.    BP 113/58  Pulse 80 SpO2 99% Weight 121 lb (54.885 kg) Height 5\' 4"  (1.626 m)    PHYSICAL EXAM General: NAD  Neck: No JVD, no thyromegaly.  Lungs: Clear to auscultation bilaterally with normal respiratory effort.  CV: Nondisplaced PMI. Irregular rhythm, normal rate, normal S1/S2, no S3, II/VI pansystolic murmur along left sternal border. Trace pretibial and periankle edema b/l. Wearing compression stockings. Abdomen: Soft, nontender, no hepatosplenomegaly, no distention.  Neurologic: Alert and oriented.  Psych: Normal affect.  Skin: Normal. Musculoskeletal: Normal range of motion, no gross deformities. Extremities: No clubbing or cyanosis.   ECG: Most recent ECG reviewed.    ASSESSMENT AND PLAN:  Essential HTN Normal today. She has unilateral renal artery stenosis. Previously d/c prazosin. Will closely monitor given her propensity for high BP.   Peripheral edema  She has had exacerbations of chronic kidney disease in the past, and has right renal artery stenosis. Weight is stable at 121 lbs. On Lasix 40 mg and 20 mg on alternate days. BMET results as noted above. No changes.  Chronic renal insufficiency/Right renal artery stenosis  Repeat renal Dopplers in 1 year. BMET results as noted above.  Atrial fibrillation  HR controlled. Continue Coreg and diltiazem. On Coumadin for anticoagulation, followed here in our clinic. Given abnormal TFT's, I have since discontinued amiodarone. Will have INR checked as she missed appt.  Chronic diastolic heart failure  Weight is down to 121 lbs. On Lasix 40 mg daily. BNP 6636 on 04/18/2014. BMET results above.  Gait instability  I previously made a referral to neurology, and she is now being managed by Dr. Terrace ArabiaYan.   Type 2 diabetes On glimepiride. HbA1c 7.1% on 04/17/2014.  Dispo: f/u 4-6 months.    Prentice DockerSuresh Koneswaran, M.D., F.A.C.C.

## 2014-06-22 NOTE — Patient Instructions (Signed)
Continue all current medications. Follow up in  4-6 months  

## 2014-06-27 LAB — POCT INR: INR: 2.3

## 2014-07-12 ENCOUNTER — Ambulatory Visit (INDEPENDENT_AMBULATORY_CARE_PROVIDER_SITE_OTHER): Payer: Medicare Other | Admitting: *Deleted

## 2014-07-12 DIAGNOSIS — Z7901 Long term (current) use of anticoagulants: Secondary | ICD-10-CM

## 2014-07-12 DIAGNOSIS — Z5181 Encounter for therapeutic drug level monitoring: Secondary | ICD-10-CM

## 2014-07-12 DIAGNOSIS — I4891 Unspecified atrial fibrillation: Secondary | ICD-10-CM

## 2014-07-12 LAB — POCT INR: INR: 2.2

## 2014-08-09 ENCOUNTER — Ambulatory Visit (INDEPENDENT_AMBULATORY_CARE_PROVIDER_SITE_OTHER): Payer: Medicare Other | Admitting: *Deleted

## 2014-08-09 DIAGNOSIS — Z7901 Long term (current) use of anticoagulants: Secondary | ICD-10-CM

## 2014-08-09 DIAGNOSIS — I4891 Unspecified atrial fibrillation: Secondary | ICD-10-CM

## 2014-08-09 DIAGNOSIS — Z5181 Encounter for therapeutic drug level monitoring: Secondary | ICD-10-CM

## 2014-08-09 LAB — POCT INR: INR: 2.1

## 2014-08-13 ENCOUNTER — Ambulatory Visit: Payer: Medicare Other | Admitting: Nurse Practitioner

## 2014-08-23 ENCOUNTER — Other Ambulatory Visit: Payer: Self-pay | Admitting: *Deleted

## 2014-08-23 MED ORDER — WARFARIN SODIUM 2 MG PO TABS: 1.0000 mg | ORAL_TABLET | Freq: Every evening | ORAL | Status: DC

## 2014-09-03 ENCOUNTER — Emergency Department (HOSPITAL_COMMUNITY): Payer: Medicare Other

## 2014-09-03 ENCOUNTER — Inpatient Hospital Stay (HOSPITAL_COMMUNITY)
Admission: EM | Admit: 2014-09-03 | Discharge: 2014-09-07 | DRG: 291 | Disposition: A | Discharge status: Home / Self Care | Payer: Medicare Other | Attending: Internal Medicine | Admitting: Internal Medicine

## 2014-09-03 ENCOUNTER — Encounter (HOSPITAL_COMMUNITY): Payer: Self-pay | Admitting: *Deleted

## 2014-09-03 DIAGNOSIS — R634 Abnormal weight loss: Secondary | ICD-10-CM

## 2014-09-03 DIAGNOSIS — R5381 Other malaise: Secondary | ICD-10-CM | POA: Diagnosis present

## 2014-09-03 DIAGNOSIS — N179 Acute kidney failure, unspecified: Secondary | ICD-10-CM

## 2014-09-03 DIAGNOSIS — I5033 Acute on chronic diastolic (congestive) heart failure: Secondary | ICD-10-CM | POA: Insufficient documentation

## 2014-09-03 DIAGNOSIS — Z6821 Body mass index (BMI) 21.0-21.9, adult: Secondary | ICD-10-CM | POA: Diagnosis not present

## 2014-09-03 DIAGNOSIS — I4891 Unspecified atrial fibrillation: Secondary | ICD-10-CM | POA: Diagnosis present

## 2014-09-03 DIAGNOSIS — E43 Unspecified severe protein-calorie malnutrition: Secondary | ICD-10-CM | POA: Diagnosis present

## 2014-09-03 DIAGNOSIS — F0391 Unspecified dementia with behavioral disturbance: Secondary | ICD-10-CM

## 2014-09-03 DIAGNOSIS — I482 Chronic atrial fibrillation: Secondary | ICD-10-CM

## 2014-09-03 DIAGNOSIS — Z7901 Long term (current) use of anticoagulants: Secondary | ICD-10-CM

## 2014-09-03 DIAGNOSIS — R0902 Hypoxemia: Secondary | ICD-10-CM

## 2014-09-03 DIAGNOSIS — F419 Anxiety disorder, unspecified: Secondary | ICD-10-CM | POA: Diagnosis present

## 2014-09-03 DIAGNOSIS — I2510 Atherosclerotic heart disease of native coronary artery without angina pectoris: Secondary | ICD-10-CM | POA: Diagnosis present

## 2014-09-03 DIAGNOSIS — I701 Atherosclerosis of renal artery: Secondary | ICD-10-CM | POA: Diagnosis present

## 2014-09-03 DIAGNOSIS — I10 Essential (primary) hypertension: Secondary | ICD-10-CM | POA: Diagnosis present

## 2014-09-03 DIAGNOSIS — I129 Hypertensive chronic kidney disease with stage 1 through stage 4 chronic kidney disease, or unspecified chronic kidney disease: Secondary | ICD-10-CM | POA: Diagnosis present

## 2014-09-03 DIAGNOSIS — F0390 Unspecified dementia without behavioral disturbance: Secondary | ICD-10-CM | POA: Diagnosis present

## 2014-09-03 DIAGNOSIS — K219 Gastro-esophageal reflux disease without esophagitis: Secondary | ICD-10-CM | POA: Diagnosis present

## 2014-09-03 DIAGNOSIS — N183 Chronic kidney disease, stage 3 unspecified: Secondary | ICD-10-CM

## 2014-09-03 DIAGNOSIS — R0602 Shortness of breath: Secondary | ICD-10-CM | POA: Insufficient documentation

## 2014-09-03 HISTORY — DX: Unspecified dementia, unspecified severity, without behavioral disturbance, psychotic disturbance, mood disturbance, and anxiety: F03.90

## 2014-09-03 LAB — CBC WITH DIFFERENTIAL/PLATELET
BASOS PCT: 1 % (ref 0–1)
Basophils Absolute: 0.1 10*3/uL (ref 0.0–0.1)
Eosinophils Absolute: 0 10*3/uL (ref 0.0–0.7)
Eosinophils Relative: 0 % (ref 0–5)
HCT: 34.3 % — ABNORMAL LOW (ref 36.0–46.0)
Hemoglobin: 10.8 g/dL — ABNORMAL LOW (ref 12.0–15.0)
Lymphocytes Relative: 8 % — ABNORMAL LOW (ref 12–46)
Lymphs Abs: 0.6 10*3/uL — ABNORMAL LOW (ref 0.7–4.0)
MCH: 26.2 pg (ref 26.0–34.0)
MCHC: 31.5 g/dL (ref 30.0–36.0)
MCV: 83.1 fL (ref 78.0–100.0)
Monocytes Absolute: 0.6 10*3/uL (ref 0.1–1.0)
Monocytes Relative: 8 % (ref 3–12)
NEUTROS ABS: 6.2 10*3/uL (ref 1.7–7.7)
NEUTROS PCT: 83 % — AB (ref 43–77)
Platelets: 232 10*3/uL (ref 150–400)
RBC: 4.13 MIL/uL (ref 3.87–5.11)
RDW: 17 % — ABNORMAL HIGH (ref 11.5–15.5)
WBC: 7.4 10*3/uL (ref 4.0–10.5)

## 2014-09-03 LAB — BASIC METABOLIC PANEL
ANION GAP: 8 (ref 5–15)
BUN: 44 mg/dL — ABNORMAL HIGH (ref 6–23)
CALCIUM: 9.4 mg/dL (ref 8.4–10.5)
CHLORIDE: 106 mmol/L (ref 96–112)
CO2: 25 mmol/L (ref 19–32)
Creatinine, Ser: 1.81 mg/dL — ABNORMAL HIGH (ref 0.50–1.10)
GFR, EST AFRICAN AMERICAN: 29 mL/min — AB (ref >90)
GFR, EST NON AFRICAN AMERICAN: 25 mL/min — AB (ref >90)
GLUCOSE: 271 mg/dL — AB (ref 70–99)
POTASSIUM: 4.9 mmol/L (ref 3.5–5.1)
SODIUM: 139 mmol/L (ref 135–145)

## 2014-09-03 LAB — TROPONIN I: Troponin I: <0.03 ng/mL (ref <0.031)

## 2014-09-03 LAB — PROTIME-INR
INR: 1.76 — AB (ref 0.00–1.49)
PROTHROMBIN TIME: 20.7 s — AB (ref 11.6–15.2)

## 2014-09-03 LAB — MAGNESIUM: Magnesium: 2.3 mg/dL (ref 1.5–2.5)

## 2014-09-03 LAB — BRAIN NATRIURETIC PEPTIDE: B Natriuretic Peptide: 930 pg/mL — ABNORMAL HIGH (ref 0.0–100.0)

## 2014-09-03 LAB — APTT: APTT: 41 s — AB (ref 24–37)

## 2014-09-03 MED ORDER — HYDROCODONE-ACETAMINOPHEN 7.5-325 MG PO TABS
1.0000 | ORAL_TABLET | Freq: Three times a day (TID) | ORAL | Status: DC | PRN
  Administered 2014-09-06: 1 via ORAL
  Filled 2014-09-03: qty 1

## 2014-09-03 MED ORDER — SODIUM CHLORIDE 0.9 % IV SOLN: 250.0000 mL | INTRAVENOUS | Status: DC | PRN

## 2014-09-03 MED ORDER — PANTOPRAZOLE SODIUM 40 MG PO TBEC
80.0000 mg | DELAYED_RELEASE_TABLET | Freq: Every day | ORAL | Status: DC
  Administered 2014-09-04 – 2014-09-07 (×4): 80 mg via ORAL
  Filled 2014-09-03 (×4): qty 2

## 2014-09-03 MED ORDER — DILTIAZEM HCL 30 MG PO TABS
30.0000 mg | ORAL_TABLET | Freq: Two times a day (BID) | ORAL | Status: DC
  Administered 2014-09-03 – 2014-09-07 (×8): 30 mg via ORAL
  Filled 2014-09-03 (×8): qty 1

## 2014-09-03 MED ORDER — FUROSEMIDE 10 MG/ML IJ SOLN
40.0000 mg | Freq: Once | INTRAMUSCULAR | Status: AC
  Administered 2014-09-03: 40 mg via INTRAVENOUS
  Filled 2014-09-03: qty 4

## 2014-09-03 MED ORDER — DONEPEZIL HCL 5 MG PO TABS
10.0000 mg | ORAL_TABLET | Freq: Every day | ORAL | Status: DC
  Administered 2014-09-03 – 2014-09-06 (×4): 10 mg via ORAL
  Filled 2014-09-03 (×4): qty 2

## 2014-09-03 MED ORDER — WARFARIN - PHARMACIST DOSING INPATIENT
Status: DC
  Administered 2014-09-04: 16:00:00

## 2014-09-03 MED ORDER — FUROSEMIDE 10 MG/ML IJ SOLN
40.0000 mg | Freq: Two times a day (BID) | INTRAMUSCULAR | Status: DC
  Administered 2014-09-04 – 2014-09-07 (×7): 40 mg via INTRAVENOUS
  Filled 2014-09-03 (×7): qty 4

## 2014-09-03 MED ORDER — POTASSIUM CHLORIDE CRYS ER 10 MEQ PO TBCR
20.0000 meq | EXTENDED_RELEASE_TABLET | Freq: Every day | ORAL | Status: DC
  Administered 2014-09-04 – 2014-09-07 (×4): 20 meq via ORAL
  Filled 2014-09-03 (×7): qty 2

## 2014-09-03 MED ORDER — WARFARIN SODIUM 5 MG PO TABS
2.5000 mg | ORAL_TABLET | Freq: Once | ORAL | Status: AC
  Administered 2014-09-03: 2.5 mg via ORAL
  Filled 2014-09-03: qty 1

## 2014-09-03 MED ORDER — MEMANTINE HCL 10 MG PO TABS
10.0000 mg | ORAL_TABLET | Freq: Two times a day (BID) | ORAL | Status: DC
  Administered 2014-09-03 – 2014-09-07 (×8): 10 mg via ORAL
  Filled 2014-09-03 (×8): qty 1

## 2014-09-03 MED ORDER — CARVEDILOL 3.125 MG PO TABS
3.1250 mg | ORAL_TABLET | Freq: Two times a day (BID) | ORAL | Status: DC
  Administered 2014-09-03 – 2014-09-07 (×8): 3.125 mg via ORAL
  Filled 2014-09-03 (×8): qty 1

## 2014-09-03 MED ORDER — ONDANSETRON HCL 4 MG/2ML IJ SOLN: 4.0000 mg | Freq: Four times a day (QID) | INTRAMUSCULAR | Status: DC | PRN

## 2014-09-03 MED ORDER — ACETAMINOPHEN 325 MG PO TABS: 650.0000 mg | ORAL_TABLET | Freq: Four times a day (QID) | ORAL | Status: DC | PRN

## 2014-09-03 MED ORDER — CETYLPYRIDINIUM CHLORIDE 0.05 % MT LIQD
7.0000 mL | Freq: Two times a day (BID) | OROMUCOSAL | Status: DC
  Administered 2014-09-03 – 2014-09-07 (×8): 7 mL via OROMUCOSAL

## 2014-09-03 MED ORDER — ONDANSETRON HCL 4 MG PO TABS: 4.0000 mg | ORAL_TABLET | Freq: Four times a day (QID) | ORAL | Status: DC | PRN

## 2014-09-03 MED ORDER — SODIUM CHLORIDE 0.9 % IJ SOLN: 3.0000 mL | INTRAMUSCULAR | Status: DC | PRN

## 2014-09-03 MED ORDER — ENSURE COMPLETE PO LIQD
237.0000 mL | Freq: Three times a day (TID) | ORAL | Status: DC
  Administered 2014-09-03 – 2014-09-07 (×10): 237 mL via ORAL

## 2014-09-03 MED ORDER — ALPRAZOLAM 0.25 MG PO TABS
0.2500 mg | ORAL_TABLET | Freq: Every evening | ORAL | Status: DC | PRN
  Administered 2014-09-05 – 2014-09-06 (×2): 0.25 mg via ORAL
  Filled 2014-09-03 (×2): qty 1

## 2014-09-03 MED ORDER — ACETAMINOPHEN 650 MG RE SUPP: 650.0000 mg | Freq: Four times a day (QID) | RECTAL | Status: DC | PRN

## 2014-09-03 MED ORDER — SODIUM CHLORIDE 0.9 % IJ SOLN
3.0000 mL | Freq: Two times a day (BID) | INTRAMUSCULAR | Status: DC
  Administered 2014-09-03 – 2014-09-07 (×8): 3 mL via INTRAVENOUS

## 2014-09-03 NOTE — ED Notes (Signed)
Sob with cough for 3 days,  Pain low back.  Alert, talking.  NP cough,

## 2014-09-03 NOTE — ED Notes (Signed)
Pt O2 saturation decreased to 84% on room air. Pt placed back on O2, EDP notified.

## 2014-09-03 NOTE — Progress Notes (Signed)
ANTICOAGULATION CONSULT NOTE - Initial Consult  Pharmacy Consult for Coumadin (chronic Rx PTA) Indication: atrial fibrillation  No Known Allergies  Patient Measurements: Height: 5\' 3"  (160 cm) Weight: 121 lb 6.4 oz (55.067 kg) IBW/kg (Calculated) : 52.4  Vital Signs: Temp: 97.5 F (36.4 C) (01/25 1751) Temp Source: Oral (01/25 1751) BP: 124/84 mmHg (01/25 1751) Pulse Rate: 70 (01/25 1751)  Labs:  Recent Labs  09/03/14 1207  HGB 10.8*  HCT 34.3*  PLT 232  APTT 41*  LABPROT 20.7*  INR 1.76*  CREATININE 1.81*  TROPONINI <0.03    Estimated Creatinine Clearance: 19.5 mL/min (by C-G formula based on Cr of 1.81).   Medical History: Past Medical History  Diagnosis Date  . Sinoatrial node dysfunction      SINUS BRADYCARDIA  . Hypertension     unspecified  . Atrial fibrillation   . Encounter for long-term (current) use of other medications     COUMADIN THERAPY  . Lower extremity weakness   . Sinus bradycardia   . Coronary artery disease   . Bilateral renal artery stenosis     MODERATE by dopplers August 2012.  Marland Kitchen Chronic renal insufficiency   . Dementia     Medications:  Prescriptions prior to admission  Medication Sig Dispense Refill Last Dose  . albuterol-ipratropium (COMBIVENT) 18-103 MCG/ACT inhaler Inhale 2 puffs into the lungs every 6 (six) hours as needed for wheezing or shortness of breath. 1 Inhaler 2 09/03/2014 at Unknown time  . ALPRAZolam (XANAX) 0.5 MG tablet Take 0.25 mg by mouth at bedtime as needed for anxiety.   09/02/2014 at Unknown time  . amLODipine (NORVASC) 10 MG tablet Take 1 tablet (10 mg total) by mouth daily. 30 tablet 6 09/03/2014 at Unknown time  . Biotin 5000 MCG TABS Take 1 tablet by mouth daily.    09/03/2014 at Unknown time  . Calcium Carbonate-Vitamin D (CALTRATE 600+D PO) Take 1 tablet by mouth 2 (two) times daily.    09/03/2014 at Unknown time  . carvedilol (COREG) 6.25 MG tablet Take 0.5 tablets (3.125 mg total) by mouth 2 (two)  times daily.   09/03/2014 at 0900  . diltiazem (CARDIZEM) 30 MG tablet Take 1 tablet (30 mg total) by mouth 2 (two) times daily. 60 tablet 6 09/03/2014 at Unknown time  . donepezil (ARICEPT) 10 MG tablet Take 1 tablet by mouth at bedtime.  6 09/02/2014 at Unknown time  . dronabinol (MARINOL) 2.5 MG capsule Take 1 capsule by mouth daily.  0 09/02/2014 at Unknown time  . furosemide (LASIX) 20 MG tablet Take 20mg  alternating with 40mg  every other day (Patient taking differently: Take 20-40 mg by mouth See admin instructions. Take 20mg  alternating with 40mg  every other day)   09/03/2014 at Unknown time  . HYDROcodone-acetaminophen (NORCO) 7.5-325 MG per tablet Take 1 tablet by mouth every 8 (eight) hours as needed for moderate pain or severe pain.   09/03/2014 at Unknown time  . memantine (NAMENDA) 10 MG tablet Take 1 tablet by mouth 2 (two) times daily.  3 09/03/2014 at Unknown time  . Multiple Vitamin (MULTIVITAMIN) tablet Take 1 tablet by mouth 2 (two) times daily.    09/03/2014 at Unknown time  . omeprazole (PRILOSEC) 40 MG capsule Take 1 capsule (40 mg total) by mouth daily. 30 capsule 3 09/03/2014 at Unknown time  . potassium chloride (K-DUR) 10 MEQ tablet Take 2 tablets (20 mEq total) by mouth daily. 60 tablet 2 09/03/2014 at Unknown time  . warfarin (COUMADIN)  2 MG tablet Take 0.5-1 tablets (1-2 mg total) by mouth every evening. Takes one half tablet (  total) on Tuesdays and Thursdays, then takes one tablet (  total) on all other days 90 tablet 2 09/02/2014 at Unknown time  . HYDROcodone-homatropine (HYCODAN) 5-1.5 MG/5ML syrup Take 5 mLs by mouth every 6 (six) hours as needed for cough. (Patient not taking: Reported on 09/03/2014) 180 mL 0 Completed Course at Unknown time    Assessment: 81yo female on chronic Coumadin PTA for h/o afib.  INR subtherapeutic on admission.  Home dose listed above.  Goal of Therapy:  INR 2-3 Monitor platelets by anticoagulation protocol: Yes   Plan:  Coumadin 2.5mg   tonight x 1 INR daily  [name], Jeraldine Primeau A 09/03/2014,6:37 PM

## 2014-09-03 NOTE — ED Provider Notes (Signed)
CSN: [id number]     Arrival date & time 09/03/14  1143 History  This chart was scribed for Karen Melter, MD by Tonye Royalty, ED Scribe. This patient was seen in room APA08/APA08 and the patient's care was started at 12:53 PM.    Chief Complaint  Patient presents with  . Shortness of Breath   The history is provided by the patient. No language interpreter was used.    HPI Comments: [name] is a 81 y.o. female with history of CHF who presents to the Emergency Department complaining of SOB with onset last night. Husband states she has received breathing treatments at hospital before but does not use them at home; husband has breathing treatment at home which she used this morning with improvement. She reports associated cough, abdominal pain. She notes that she has been having memory problems and suspects she is developing dementia; she saw her PCP a few weeks ago for the same problem. Family notes she is prone to falling. She states her appetite has not been good recently. She denies vomiting, fever, or weight change.  Family members report ongoing "dementia", being followed by both cardiologist and primary care doctor.  She is taking all her medications as directed.  There are no other known modifying factors.  Past Medical History  Diagnosis Date  . Sinoatrial node dysfunction      SINUS BRADYCARDIA  . Hypertension     unspecified  . Atrial fibrillation   . Encounter for long-term (current) use of other medications     COUMADIN THERAPY  . Lower extremity weakness   . Sinus bradycardia   . Coronary artery disease   . Bilateral renal artery stenosis     MODERATE by dopplers August 2012.  Marland Kitchen Chronic renal insufficiency    Past Surgical History  Procedure Laterality Date  . Knee arthroscopy    . Cardioversion    . Colonoscopy  05/2011    Dr. Allena Katz: normal. no bx.   Family History  Problem Relation Age of Onset  . Cancer Brother   . Heart failure Mother   . Cirrhosis  Other   . High blood pressure Mother    History  Substance Use Topics  . Smoking status: Never Smoker   . Smokeless tobacco: Never Used     Comment: Never smoked  . Alcohol Use: No   OB History    No data available     Review of Systems  Constitutional: Positive for appetite change. Negative for fever and unexpected weight change.  Respiratory: Positive for cough and shortness of breath.   Gastrointestinal: Positive for abdominal pain. Negative for vomiting.  Psychiatric/Behavioral: Positive for confusion.  All other systems reviewed and are negative.    Allergies  Review of patient's allergies indicates no known allergies.  Home Medications   Prior to Admission medications   Medication Sig Start Date End Date Taking? Authorizing Provider  albuterol-ipratropium (COMBIVENT) 18-103 MCG/ACT inhaler Inhale 2 puffs into the lungs every 6 (six) hours as needed for wheezing or shortness of breath. 04/19/14  Yes Vassie Loll, MD  ALPRAZolam Prudy Feeler) 0.5 MG tablet Take 0.25 mg by mouth at bedtime as needed for anxiety.   Yes Historical Provider, MD  amLODipine (NORVASC) 10 MG tablet Take 1 tablet (10 mg total) by mouth daily. 05/03/14  Yes Laqueta Linden, MD  Biotin 5000 MCG TABS Take 1 tablet by mouth daily.    Yes Historical Provider, MD  Calcium Carbonate-Vitamin D (CALTRATE 600+D  PO) Take 1 tablet by mouth 2 (two) times daily.    Yes Historical Provider, MD  carvedilol (COREG) 6.25 MG tablet Take 0.5 tablets (3.125 mg total) by mouth 2 (two) times daily. 08/16/13  Yes Laqueta Linden, MD  diltiazem (CARDIZEM) 30 MG tablet Take 1 tablet (30 mg total) by mouth 2 (two) times daily. 05/03/14  Yes Laqueta Linden, MD  donepezil (ARICEPT) 10 MG tablet Take 1 tablet by mouth at bedtime. 08/06/14  Yes Historical Provider, MD  dronabinol (MARINOL) 2.5 MG capsule Take 1 capsule by mouth daily. 07/28/14  Yes Historical Provider, MD  furosemide (LASIX) 20 MG tablet Take  alternating  with  every other day Patient taking differently: Take 20-40 mg by mouth See admin instructions. Take  alternating with  every other day 05/17/14  Yes Laqueta Linden, MD  HYDROcodone-acetaminophen (NORCO) 7.5-325 MG per tablet Take 1 tablet by mouth every 8 (eight) hours as needed for moderate pain or severe pain.   Yes Historical Provider, MD  memantine (NAMENDA) 10 MG tablet Take 1 tablet by mouth 2 (two) times daily. 08/09/14  Yes Historical Provider, MD  Multiple Vitamin (MULTIVITAMIN) tablet Take 1 tablet by mouth 2 (two) times daily.    Yes Historical Provider, MD  omeprazole (PRILOSEC) 40 MG capsule Take 1 capsule (40 mg total) by mouth daily. 05/03/14  Yes Laqueta Linden, MD  potassium chloride (K-DUR) 10 MEQ tablet Take 2 tablets (20 mEq total) by mouth daily. 04/19/14  Yes Vassie Loll, MD  warfarin (COUMADIN) 2 MG tablet Take 0.5-1 tablets (1-2 mg total) by mouth every evening. Takes one half tablet (  total) on Tuesdays and Thursdays, then takes one tablet (  total) on all other days 08/23/14  Yes Laqueta Linden, MD  HYDROcodone-homatropine [facility]) 5-1.5 MG/5ML syrup Take 5 mLs by mouth every 6 (six) hours as needed for cough. Patient not taking: Reported on 09/03/2014 04/19/14   Vassie Loll, MD   BP 118/72 mmHg  Pulse 84  Temp(Src) 97.7 F (36.5 C) (Oral)  Resp 24  Ht  (1.6 m)  Wt 121 lb 8 oz (55.112 kg)  BMI 21.53 kg/m2  SpO2 92% Physical Exam  Constitutional: She is oriented to person, place, and time. She appears well-developed.  Frail, elderly  HENT:  Head: Normocephalic and atraumatic.  Eyes: Conjunctivae and EOM are normal. Pupils are equal, round, and reactive to light.  Neck: Normal range of motion and phonation normal. Neck supple.  Cardiovascular: Normal rate and regular rhythm.   JVD present at 45 degrees  Pulmonary/Chest: Effort normal. She has rales. She exhibits no tenderness.  decreased air movement bilaterally Rales  bilaterally Dull to percussion halfway up bilaterally  Abdominal: Soft. She exhibits no distension. There is no tenderness. There is no guarding.  Musculoskeletal: Normal range of motion. She exhibits edema and tenderness.  1+ edema and mild bilateral lower leg tenderness  Neurological: She is alert and oriented to person, place, and time. She exhibits normal muscle tone.  Skin: Skin is warm and dry.  Psychiatric: She has a normal mood and affect. Her behavior is normal. Judgment and thought content normal.  Nursing note and vitals reviewed.   ED Course  Procedures (including critical care time)  DIAGNOSTIC STUDIES: Oxygen Saturation is 90% on room air, adequate by my interpretation.    COORDINATION OF CARE: 12:59 PM Discussed treatment plan with patient at beside, the patient agrees with the plan and has no further questions at this time.  Medications  furosemide (LASIX) injection 40 mg (40 mg Intravenous Given 09/03/14 1328)    Patient Vitals for the past 24 hrs:  BP Temp Temp src Pulse Resp SpO2 Height Weight  09/03/14 1512 - - - - - 92 % - -  09/03/14 1510 - - - - - (!) 84 % - -  09/03/14 1500 118/72 mmHg - - 84 24 92 % - -  09/03/14 1434 128/80 mmHg - - 79 24 93 % - -  09/03/14 1330 132/79 mmHg - - - 17 - - -  09/03/14 1322 - - - - - - - 121 lb 8 oz (55.112 kg)  09/03/14 1307 - - - 107 23 - - -  09/03/14 1300 117/73 mmHg - - 88 24 - - -  09/03/14 1152 119/66 mmHg 97.7 F (36.5 C) Oral 76 22 90 % 5\' 3"  (1.6 m) 115 lb (52.164 kg)    3:39 PM Reevaluation with update and discussion. After initial assessment and treatment, an updated evaluation reveals she voided after receiving Lasix IV.  She was able to ambulate, on room air without difficulty.  When she returned from walking.  Her oxygen saturations were 84%.  With rest, they improved to 92%, but then, decreased to 84%.  She was otherwise asymptomatic with this effort.  Findings discussed with patient and family members, all  questions answered. Marigrace Mccole L    3:45 PM-Consult complete with Dr. Katrine Coho. Patient case explained and discussed. He agrees to admit patient for further evaluation and treatment. Call ended at 16:20.  CRITICAL CARE Performed by: [name] Total critical care time: 45 minutes Critical care time was exclusive of separately billable procedures and treating other patients. Critical care was necessary to treat or prevent imminent or life-threatening deterioration. Critical care was time spent personally by me on the following activities: development of treatment plan with patient and/or surrogate as well as nursing, discussions with consultants, evaluation of patient's response to treatment, examination of patient, obtaining history from patient or surrogate, ordering and performing treatments and interventions, ordering and review of laboratory studies, ordering and review of radiographic studies, pulse oximetry and re-evaluation of patient's condition.  Labs Review Labs Reviewed  CBC WITH DIFFERENTIAL/PLATELET - Abnormal; Notable for the following:    Hemoglobin 10.8 (*)    HCT 34.3 (*)    RDW 17.0 (*)    Neutrophils Relative % 83 (*)    Lymphocytes Relative 8 (*)    Lymphs Abs 0.6 (*)    All other components within normal limits  BASIC METABOLIC PANEL - Abnormal; Notable for the following:    Glucose, Bld 271 (*)    BUN 44 (*)    Creatinine, Ser 1.81 (*)    GFR calc non Af Amer 25 (*)    GFR calc Af Amer 29 (*)    All other components within normal limits  BRAIN NATRIURETIC PEPTIDE - Abnormal; Notable for the following:    B Natriuretic Peptide 930.0 (*)    All other components within normal limits  APTT - Abnormal; Notable for the following:    aPTT 41 (*)    All other components within normal limits  PROTIME-INR - Abnormal; Notable for the following:    Prothrombin Time 20.7 (*)    INR 1.76 (*)    All other components within normal limits  TROPONIN I    Imaging  Review Dg Chest Portable 1 View  09/03/2014   CLINICAL DATA:  Shortness of breath for a  couple of days. Initial encounter.  EXAM: PORTABLE CHEST - 1 VIEW  COMPARISON:  CT chest 04/13/2014  FINDINGS: Bilateral interstitial thickening. Trace right pleural effusion. Small left pleural effusion. No pneumothorax. Left basilar airspace disease likely reflecting atelectasis. No other focal parenchymal opacity. Stable cardiomediastinal silhouette. Prominence of the central pulmonary vasculature. No acute osseous abnormality.  IMPRESSION: Findings most consistent with CHF.   Electronically Signed   By: Elige Ko   On: 09/03/2014 12:32     EKG Interpretation   Date/Time:  Monday September 03 2014 12:03:26 EST Ventricular Rate:  83 PR Interval:    QRS Duration: 79 QT Interval:  453 QTC Calculation: 532 R Axis:   -3 Text Interpretation:  Atrial flutter Low voltage, extremity leads  Prolonged QT interval since last tracing no significant change Confirmed  by Effie Shy  MD, Mechele Collin ([id number]) on 09/03/2014 2:44:30 PM      MDM   Final diagnoses:  Acute on chronic diastolic congestive heart failure  Hypoxia    Clinically, patient has congestive heart failure, as her source of her dyspnea.  She is hypoxic.  X-ray indicates CHF and BNP is elevated.  There is no apparent evidence for pneumonia or PE.  She is not currently on home oxygen.  I suspect that her congestive heart failure is a matter of nonspecific decompensation, and not an acute pump failure.  Her heart failure in the past has been diastolic.  Nursing Notes Reviewed/ Care Coordinated, and agree without changes. Applicable Imaging Reviewed.  Interpretation of Laboratory Data incorporated into ED treatment  Plan: Admit  I personally performed the services described in this documentation, which was scribed in my presence. The recorded information has been reviewed and is accurate.     Karen Melter, MD 09/03/14 [id number]

## 2014-09-03 NOTE — H&P (Signed)
[facility] History and Physical  [name] ZOX:[id number] DOB: 1929-02-02 DOA: 09/03/2014  Referring physician: Dr Effie Shy - [facility] PCP: Ignatius Specking., MD   Chief Complaint: SOB  HPI: [name] is a 81 y.o. female  SOB. Onset last night. Getting worse. Used husbands nebulizer this morning w/ benefit. Associated w/ cough and increasing LE edema for several months. Wt gain over past several days. Denies orthopnea, fevers, rinorrhea, cough, CP, Palpitations, HA, syncope. Lasix 20mg  and 40mg  alternating daily w/o benefit.    Review of Systems:  Constitutional:  No  night sweats, Fevers, chills, fatigue.  HEENT:  No headaches, Difficulty swallowing,Tooth/dental problems,Sore throat,  No sneezing, itching, ear ache, nasal congestion, post nasal drip,  Cardio-vascular:  Per HPI GI:  No heartburn, indigestion, abdominal pain, nausea, vomiting, diarrhea, change in bowel habits, loss of appetite  Resp:  Per HPI Skin:  no rash or lesions.  GU:  no dysuria, change in color of urine, no urgency or frequency. No flank pain.  Musculoskeletal:   No joint pain or swelling. No decreased range of motion. No back pain.  Psych:  No change in mood or affect. No depression or anxiety. No memory loss.   Past Medical History  Diagnosis Date  . Sinoatrial node dysfunction      SINUS BRADYCARDIA  . Hypertension     unspecified  . Atrial fibrillation   . Encounter for long-term (current) use of other medications     COUMADIN THERAPY  . Lower extremity weakness   . Sinus bradycardia   . Coronary artery disease   . Bilateral renal artery stenosis     MODERATE by dopplers August 2012.  Marland Kitchen Chronic renal insufficiency   . Dementia    Past Surgical History  Procedure Laterality Date  . Knee arthroscopy    . Cardioversion    . Colonoscopy  05/2011    Dr. Allena Katz: normal. no bx.   Social History:  reports that she has never smoked. She has never used smokeless tobacco. She  reports that she does not drink alcohol or use illicit drugs.  No Known Allergies  Family History  Problem Relation Age of Onset  . Cancer Brother   . Heart failure Mother   . Cirrhosis Other   . High blood pressure Mother      Prior to Admission medications   Medication Sig Start Date End Date Taking? Authorizing Provider  albuterol-ipratropium (COMBIVENT) 18-103 MCG/ACT inhaler Inhale 2 puffs into the lungs every 6 (six) hours as needed for wheezing or shortness of breath. 04/19/14  Yes Vassie Loll, MD  ALPRAZolam Prudy Feeler) 0.5 MG tablet Take 0.25 mg by mouth at bedtime as needed for anxiety.   Yes Historical Provider, MD  amLODipine (NORVASC) 10 MG tablet Take 1 tablet (10 mg total) by mouth daily. 05/03/14  Yes Laqueta Linden, MD  Biotin 5000 MCG TABS Take 1 tablet by mouth daily.    Yes Historical Provider, MD  Calcium Carbonate-Vitamin D (CALTRATE 600+D PO) Take 1 tablet by mouth 2 (two) times daily.    Yes Historical Provider, MD  carvedilol (COREG) 6.25 MG tablet Take 0.5 tablets (3.125 mg total) by mouth 2 (two) times daily. 08/16/13  Yes Laqueta Linden, MD  diltiazem (CARDIZEM) 30 MG tablet Take 1 tablet (30 mg total) by mouth 2 (two) times daily. 05/03/14  Yes Laqueta Linden, MD  donepezil (ARICEPT) 10 MG tablet Take 1 tablet by mouth at bedtime. 08/06/14  Yes Historical Provider, MD  dronabinol (MARINOL) 2.5 MG capsule Take 1 capsule by mouth daily. 07/28/14  Yes Historical Provider, MD  furosemide (LASIX) 20 MG tablet Take  alternating with  every other day Patient taking differently: Take 20-40 mg by mouth See admin instructions. Take  alternating with  every other day 05/17/14  Yes Laqueta Linden, MD  HYDROcodone-acetaminophen (NORCO) 7.5-325 MG per tablet Take 1 tablet by mouth every 8 (eight) hours as needed for moderate pain or severe pain.   Yes Historical Provider, MD  memantine (NAMENDA) 10 MG tablet Take 1 tablet by mouth 2 (two) times  daily. 08/09/14  Yes Historical Provider, MD  Multiple Vitamin (MULTIVITAMIN) tablet Take 1 tablet by mouth 2 (two) times daily.    Yes Historical Provider, MD  omeprazole (PRILOSEC) 40 MG capsule Take 1 capsule (40 mg total) by mouth daily. 05/03/14  Yes Laqueta Linden, MD  potassium chloride (K-DUR) 10 MEQ tablet Take 2 tablets (20 mEq total) by mouth daily. 04/19/14  Yes Vassie Loll, MD  warfarin (COUMADIN) 2 MG tablet Take 0.5-1 tablets (1-2 mg total) by mouth every evening. Takes one half tablet (  total) on Tuesdays and Thursdays, then takes one tablet (  total) on all other days 08/23/14  Yes Laqueta Linden, MD  HYDROcodone-homatropine [facility]) 5-1.5 MG/5ML syrup Take 5 mLs by mouth every 6 (six) hours as needed for cough. Patient not taking: Reported on 09/03/2014 04/19/14   Vassie Loll, MD   Physical Exam: Filed Vitals:   09/03/14 1600 09/03/14 1630 09/03/14 1637 09/03/14 1724  BP: 123/74 129/95 119/69   Pulse: 62 67 83   Temp:   97.5 F (36.4 C)   TempSrc:   Oral   Resp:   18   Height:      Weight:      SpO2:   92% 93%    Wt Readings from Last 3 Encounters:  09/03/14 55.112 kg (121 lb 8 oz)  06/22/14 54.885 kg (121 lb)  05/07/14 55.792 kg (123 lb)    General:  Appears calm and comfortable Eyes:  PERRL, normal lids, irises & conjunctiva ENT:  grossly normal hearing, lips & tongue Neck:  no LAD, masses or thyromegaly Cardiovascular: II/VI systolic murmur , irregularly irregular 1+ LE edema on L and trace edema on R.  Telemetry: Afib.  Respiratory: basilar crackles bilat w/ decreased breath sounds in R Lung base. On 2L [place] w/ short sentences when speaking otherwise comfortable  Abdomen:  soft, ntnd Skin:  no rash or induration seen on limited exam Musculoskeletal:  grossly normal tone BUE/BLE Psychiatric: grossly normal mood and affect, speech fluent and appropriate Neurologic: CN 2-12 grossly intact. AAO to person and place and year but not to month. .           Labs on Admission:  Basic Metabolic Panel:  Recent Labs Lab 09/03/14 1207  NA 139  K 4.9  CL 106  CO2 25  GLUCOSE 271*  BUN 44*  CREATININE 1.81*  CALCIUM 9.4   Liver Function Tests: No results for input(s): AST, ALT, ALKPHOS, BILITOT, PROT, ALBUMIN in the last 168 hours. No results for input(s): LIPASE, AMYLASE in the last 168 hours. No results for input(s): AMMONIA in the last 168 hours. CBC:  Recent Labs Lab 09/03/14 1207  WBC 7.4  NEUTROABS 6.2  HGB 10.8*  HCT 34.3*  MCV 83.1  PLT 232   Cardiac Enzymes:  Recent Labs Lab 09/03/14 1207  TROPONINI <0.03    BNP (last 3 results)  Recent Labs  04/13/14 1611 04/18/14 0519  PROBNP 13309.0* 6636.0*   CBG: No results for input(s): GLUCAP in the last 168 hours.  Radiological Exams on Admission: Dg Chest Portable 1 View  09/03/2014   CLINICAL DATA:  Shortness of breath for a couple of days. Initial encounter.  EXAM: PORTABLE CHEST - 1 VIEW  COMPARISON:  CT chest 04/13/2014  FINDINGS: Bilateral interstitial thickening. Trace right pleural effusion. Small left pleural effusion. No pneumothorax. Left basilar airspace disease likely reflecting atelectasis. No other focal parenchymal opacity. Stable cardiomediastinal silhouette. Prominence of the central pulmonary vasculature. No acute osseous abnormality.  IMPRESSION: Findings most consistent with CHF.   Electronically Signed   By: Elige Ko   On: 09/03/2014 12:32    EKG: Independently reviewed. Aflutter, prolonged QT - 432. No sign of ACS  Assessment/Plan Principal Problem:   Acute on chronic diastolic CHF (congestive heart failure) Active Problems:   HTN (hypertension)   Atrial fibrillation   Dementia   Acute renal failure superimposed on stage 3 chronic kidney disease   GERD without esophagitis   Anxiety   Loss of weight   Physical deconditioning   Acute Diastolic CHF: BNP 930. Echo from 2014 shows EF of 65%. O2 sats in the 80s w/o oxygen. CXR  consistent w/ CHF. Home dose of Lasix ,  alternating QOD not working.  - Admit - 2D Echo - IV lasix  BID - Iron studies - strict I/O - Daily wts - Kdur - cont bblocker  - consider adding ACEi if Cr improves  Physical deconditioning: familhy reports gradual wt loss and frequent falls. Anticipate this is in part due to dementia and other ongoing chronic medical problems. Family upset at being put on Marinol recently and feel this has altered her personality. Tried Megace w/o benefit.  - PT/OT - Nutrition - ensure TID between meals.   Acute on Chronic Kidney Disease: Cr. 1.8 on admission. Baseline not clear but likely around 1.3.  - BMET in am (anticipate worsening given diuresis.)  Dementia: recent Dx for pt. Pts daughter very concerned about using medications at night for sedation (husband and pt amenable.). Husband to spend the night and to be around as much as possible.  - continue aricept and namenda - no sedating or sleep meds w/o consulting pt and family   Afib: Chronic. INR 1.7 on admission. No RVR - continue coumadin - cont carvedilol  HTN:  - continue Dilt, coreg  GERD:  - continue PPI  Anxiety: - continue Xanax   Code Status: FULL DVT Prophylaxis: Coumadin Family Communication: husband, daughter,  Disposition Plan: pending improvement  Shatima Zalar J, MD Family Medicine [facility] [email or web address] Password [email or web address]

## 2014-09-03 NOTE — ED Notes (Signed)
Upon returning to room, initial O2 sat 84%, increased to 92% after 2-3 minutes.

## 2014-09-03 NOTE — ED Notes (Addendum)
Pt ambulated in hallway on room air with EDP. Steady gait.

## 2014-09-04 DIAGNOSIS — E43 Unspecified severe protein-calorie malnutrition: Secondary | ICD-10-CM

## 2014-09-04 DIAGNOSIS — I509 Heart failure, unspecified: Secondary | ICD-10-CM

## 2014-09-04 LAB — COMPREHENSIVE METABOLIC PANEL
ALT: 110 U/L — ABNORMAL HIGH (ref 0–35)
AST: 132 U/L — AB (ref 0–37)
Albumin: 3.9 g/dL (ref 3.5–5.2)
Alkaline Phosphatase: 126 U/L — ABNORMAL HIGH (ref 39–117)
Anion gap: 8 (ref 5–15)
BILIRUBIN TOTAL: 0.9 mg/dL (ref 0.3–1.2)
BUN: 48 mg/dL — AB (ref 6–23)
CALCIUM: 9.5 mg/dL (ref 8.4–10.5)
CO2: 28 mmol/L (ref 19–32)
Chloride: 107 mmol/L (ref 96–112)
Creatinine, Ser: 1.56 mg/dL — ABNORMAL HIGH (ref 0.50–1.10)
GFR calc Af Amer: 34 mL/min — ABNORMAL LOW (ref >90)
GFR calc non Af Amer: 30 mL/min — ABNORMAL LOW (ref >90)
Glucose, Bld: 109 mg/dL — ABNORMAL HIGH (ref 70–99)
Potassium: 4.8 mmol/L (ref 3.5–5.1)
SODIUM: 143 mmol/L (ref 135–145)
Total Protein: 6.6 g/dL (ref 6.0–8.3)

## 2014-09-04 LAB — CBC
HCT: 36.9 % (ref 36.0–46.0)
Hemoglobin: 11.5 g/dL — ABNORMAL LOW (ref 12.0–15.0)
MCH: 25.4 pg — ABNORMAL LOW (ref 26.0–34.0)
MCHC: 31.2 g/dL (ref 30.0–36.0)
MCV: 81.5 fL (ref 78.0–100.0)
Platelets: 229 10*3/uL (ref 150–400)
RBC: 4.53 MIL/uL (ref 3.87–5.11)
RDW: 17.3 % — AB (ref 11.5–15.5)
WBC: 7.2 10*3/uL (ref 4.0–10.5)

## 2014-09-04 LAB — PROTIME-INR
INR: 1.85 — ABNORMAL HIGH (ref 0.00–1.49)
Prothrombin Time: 21.5 seconds — ABNORMAL HIGH (ref 11.6–15.2)

## 2014-09-04 LAB — IRON AND TIBC
Iron: 15 ug/dL — ABNORMAL LOW (ref 42–145)
Saturation Ratios: 5 % — ABNORMAL LOW (ref 20–55)
TIBC: 303 ug/dL (ref 250–470)
UIBC: 288 ug/dL (ref 125–400)

## 2014-09-04 LAB — FERRITIN: FERRITIN: 41 ng/mL (ref 10–291)

## 2014-09-04 MED ORDER — ADULT MULTIVITAMIN W/MINERALS CH
1.0000 | ORAL_TABLET | Freq: Every day | ORAL | Status: DC
  Administered 2014-09-04 – 2014-09-07 (×4): 1 via ORAL
  Filled 2014-09-04 (×4): qty 1

## 2014-09-04 MED ORDER — WARFARIN SODIUM 5 MG PO TABS
2.5000 mg | ORAL_TABLET | Freq: Once | ORAL | Status: AC
  Administered 2014-09-04: 2.5 mg via ORAL
  Filled 2014-09-04: qty 1

## 2014-09-04 MED ORDER — LEVALBUTEROL HCL 0.63 MG/3ML IN NEBU
0.6300 mg | INHALATION_SOLUTION | Freq: Four times a day (QID) | RESPIRATORY_TRACT | Status: DC | PRN
  Administered 2014-09-04: 0.63 mg via RESPIRATORY_TRACT
  Filled 2014-09-04: qty 3

## 2014-09-04 NOTE — Progress Notes (Signed)
[facility] PROGRESS NOTE  [name] ZOX:[id number] DOB: May 15, 1929 DOA: 09/03/2014 PCP: Ignatius Specking., MD  Assessment/Plan: 1. Acute on chronic diastolic CHF 1. Improving with IV lasix 2. Follow i/o's and daily wts 3. Wt of 55.6 kg today, 55.1 kg on admit 4. Continue lasix for now as pt is reporting symptom improvement 5. Consider lasix dose increase if wt does not improve 6. Follow lytes and renal function (see below) 2. Debility 1. Physical therapy consulted 3. Acute on chronic renal failure 1. Consider possible cardiorenal syndrome as patient's renal function has improved slightly with diuresis 2. Continue to follow renal function 4. Dementia 1. Stable 5. Afib 1. Rate controlled 2. On warfarin, to be dosed by Pharmacy 6. HTN 1. Stable and controlled 2. Cont current regimen and titrate as needed 7. Anxiety 1. Seems stable this AM 8. DVT prophylaxis 1. coumadin  Code Status: Full Family Communication: Pt in room, family at bedside (indicate person spoken with, relationship, and if by phone, the number) Disposition Plan: Pending   Consultants:  none  Procedures:    Antibiotics:   (indicate start date, and stop date if known)  HPI/Subjective: Feels better today. Reports breathing better.  Objective: Filed Vitals:   09/03/14 1724 09/03/14 1751 09/03/14 2106 09/04/14 0500  BP:  124/84 126/69 146/72  Pulse:  70 85 68  Temp:  97.5 F (36.4 C) 97.3 F (36.3 C) 98.1 F (36.7 C)  TempSrc:  Oral Oral Oral  Resp:  Height:   (1.6 m)    Weight:  55.067 kg (121 lb 6.4 oz)  55.611 kg (122 lb 9.6 oz)  SpO2: 93% 98% 93% 90%    Intake/Output Summary (Last 24 hours) at 09/04/14 1415 Last data filed at 09/04/14 0300  Gross per 24 hour  Intake    360 ml  Output    450 ml  Net    -90 ml   Filed Weights   09/03/14 1322 09/03/14 1751 09/04/14 0500  Weight: 55.112 kg (121 lb 8 oz) 55.067 kg (121 lb 6.4 oz) 55.611 kg (122 lb 9.6 oz)     Exam:   General:  Awake, in nad  Cardiovascular: regular, s1, s2  Respiratory: normal resp effort, no wheezing  Abdomen: soft, nondistended  Musculoskeletal: perfused, no clubbing   Data Reviewed: Basic Metabolic Panel:  Recent Labs Lab 09/03/14 1207 09/04/14 0521  NA 139 143  K 4.9 4.8  CL 106 107  CO2 25 28  GLUCOSE 271* 109*  BUN 44* 48*  CREATININE 1.81* 1.56*  CALCIUM 9.4 9.5  MG 2.3  --    Liver Function Tests:  Recent Labs Lab 09/04/14 0521  AST 132*  ALT 110*  ALKPHOS 126*  BILITOT 0.9  PROT 6.6  ALBUMIN 3.9   No results for input(s): LIPASE, AMYLASE in the last 168 hours. No results for input(s): AMMONIA in the last 168 hours. CBC:  Recent Labs Lab 09/03/14 1207 09/04/14 0521  WBC 7.4 7.2  NEUTROABS 6.2  --   HGB 10.8* 11.5*  HCT 34.3* 36.9  MCV 83.1 81.5  PLT 232 229   Cardiac Enzymes:  Recent Labs Lab 09/03/14 1207  TROPONINI <0.03   BNP (last 3 results)  Recent Labs  04/13/14 1611 04/18/14 0519  PROBNP 13309.0* 6636.0*   CBG: No results for input(s): GLUCAP in the last 168 hours.  No results found for this or any previous visit (from the past 240 hour(s)).   Studies: Dg  Chest Portable 1 View  09/03/2014   CLINICAL DATA:  Shortness of breath for a couple of days. Initial encounter.  EXAM: PORTABLE CHEST - 1 VIEW  COMPARISON:  CT chest 04/13/2014  FINDINGS: Bilateral interstitial thickening. Trace right pleural effusion. Small left pleural effusion. No pneumothorax. Left basilar airspace disease likely reflecting atelectasis. No other focal parenchymal opacity. Stable cardiomediastinal silhouette. Prominence of the central pulmonary vasculature. No acute osseous abnormality.  IMPRESSION: Findings most consistent with CHF.   Electronically Signed   By: Elige Ko   On: 09/03/2014 12:32    Scheduled Meds: . antiseptic oral rinse  7 mL Mouth Rinse BID  . carvedilol  3.125 mg Oral BID WC  . diltiazem  30 mg Oral BID   . donepezil  10 mg Oral QHS  . feeding supplement (ENSURE COMPLETE)  237 mL Oral TID BM  . furosemide  40 mg Intravenous BID  . memantine  10 mg Oral BID  . multivitamin with minerals  1 tablet Oral Daily  . pantoprazole  80 mg Oral Daily  . potassium chloride  20 mEq Oral Daily  . sodium chloride  3 mL Intravenous Q12H  . warfarin  2.5 mg Oral Once  . Warfarin - Pharmacist Dosing Inpatient   Does not apply Q24H   Continuous Infusions:   Principal Problem:   Acute on chronic diastolic CHF (congestive heart failure) Active Problems:   HTN (hypertension)   Atrial fibrillation   Dementia   Acute renal failure superimposed on stage 3 chronic kidney disease   GERD without esophagitis   Anxiety   Loss of weight   Physical deconditioning   SOB (shortness of breath)   Protein-calorie malnutrition, severe  Time spent:  [name] K  [facility] Pager [phone]. If 7PM-7AM, please contact night-coverage at [email or web address], password [facility] 09/04/2014, 2:15 PM  LOS: 1 day

## 2014-09-04 NOTE — Progress Notes (Signed)
  Echocardiogram 2D Echocardiogram has been performed.  [name] 09/04/2014, 11:04 AM

## 2014-09-04 NOTE — Progress Notes (Signed)
INITIAL NUTRITION ASSESSMENT  DOCUMENTATION CODES Per approved criteria  -Severe malnutrition in the context of chronic illness  [name] meets criteria for severe malnutrition in the context of chronic illness as evidenced by >10% weight loss in 6 months and decreased energy intake <75% for greater than 1 month.  INTERVENTION: Continue Ensure TID Provide MVI  NUTRITION DIAGNOSIS: Inadequate oral intake related to poor appetite as evidenced by decreased PO intake for >2 months.   Goal: [name] to meet >/= 90% of estimated energy requirements  Monitor:  PO intake, weight, labs  Reason for Assessment: Consult for assessment of nutrition requirements/needs  81 y.o. female  Admitting Dx: Acute on chronic diastolic CHF (congestive heart failure)  ASSESSMENT: 81 y/o female with history of CHF who was admitted to the ED complaining of SOB with onset last night. Husband states she has received breathing treatments at hospital before but does not use them at home. [name] reports associated cough and abdominal pain. She notes that she has been having memory problems and suspects she is developing dementia; being followed by both cardiologist and primary care doctor. Family reports she is prone to falling.  She has noticed an appetite change, and confirms abdominal pain. Currently receiving Ensure TID, pantoprazole   Wt hx shows 13% wt loss within 6 months. [name] confirmed wt hx and wt loss. Her husband was at bedside. [name] noted her usual weight was 160 lb, and for at least two months she has had a poor appetite. She stated that she "can't stand the thought of food" and does not have a good appetite. [name] reports no problems/difficulty chewing or swallowing.   [name] ate 10% of breakfast this morning. Husband stated that she "picks at her food" when at home. She does enjoy yogurt and Ensure.   Labs show poor BUN/creatinine ratio Continue Ensure TID Provide MVI  Nutrition Focused Physical Exam:  Subcutaneous  Fat:  Orbital Region: mild depletion Upper Arm Region: moderate depletion Thoracic and Lumbar Region: mild depletion  Muscle:  Temple Region: mild depletion Clavicle Bone Region: WDL Clavicle and Acromion Bone Region: severe depletion Scapular Bone Region: moderate depletion Dorsal Hand: moderate depletion Patellar Region: moderate depletion Anterior Thigh Region: unable to assess Posterior Calf Region: moderate depletion  Edema: not present   Height: Ht Readings from Last 1 Encounters:  09/03/14 5\' 3"  (1.6 m)    Weight: Wt Readings from Last 1 Encounters:  09/04/14 122 lb 9.6 oz (55.611 kg)    Ideal Body Weight: 115 lb (52.3 kg)  % Ideal Body Weight: 106%  Wt Readings from Last 10 Encounters:  09/04/14 122 lb 9.6 oz (55.611 kg)  06/22/14 121 lb (54.885 kg)  05/07/14 123 lb (55.792 kg)  05/03/14 122 lb (55.339 kg)  04/19/14 134 lb 11.2 oz (61.1 kg)  04/03/14 140 lb (63.504 kg)  03/26/14 140 lb (63.504 kg)  03/05/14 142 lb 6.4 oz (64.592 kg)  02/21/14 140 lb (63.504 kg)  01/19/14 137 lb (62.143 kg)    Usual Body Weight: 160 lb  % Usual Body Weight: 76%  BMI:  Body mass index is 21.72 kg/(m^2).  Estimated Nutritional Needs: Kcal: 1300-1500  Protein: 72-90 grams Fluid: 1.3 L  Skin: intact  Diet Order: Heart Healthy  EDUCATION NEEDS: -No education needs identified at this time   Intake/Output Summary (Last 24 hours) at 09/04/14 0901 Last data filed at 09/04/14 0300  Gross per 24 hour  Intake    360 ml  Output    450 ml  Net    -90 ml    Last BM: PTA   Labs:   Recent Labs Lab 09/03/14 1207 09/04/14 0521  NA 139 143  K 4.9 4.8  CL 106 107  CO2 25 28  BUN 44* 48*  CREATININE 1.81* 1.56*  CALCIUM 9.4 9.5  MG 2.3  --   GLUCOSE 271* 109*    CBG (last 3)  No results for input(s): GLUCAP in the last 72 hours.  Scheduled Meds: . antiseptic oral rinse  7 mL Mouth Rinse BID  . carvedilol  3.125 mg Oral BID WC  . diltiazem  30 mg Oral  BID  . donepezil  10 mg Oral QHS  . feeding supplement (ENSURE COMPLETE)  237 mL Oral TID BM  . furosemide  40 mg Intravenous BID  . memantine  10 mg Oral BID  . pantoprazole  80 mg Oral Daily  . potassium chloride  20 mEq Oral Daily  . sodium chloride  3 mL Intravenous Q12H  . Warfarin - Pharmacist Dosing Inpatient   Does not apply Q24H    Continuous Infusions:   Past Medical History  Diagnosis Date  . Sinoatrial node dysfunction      SINUS BRADYCARDIA  . Hypertension     unspecified  . Atrial fibrillation   . Encounter for long-term (current) use of other medications     COUMADIN THERAPY  . Lower extremity weakness   . Sinus bradycardia   . Coronary artery disease   . Bilateral renal artery stenosis     MODERATE by dopplers August 2012.  Marland Kitchen Chronic renal insufficiency   . Dementia     Past Surgical History  Procedure Laterality Date  . Knee arthroscopy    . Cardioversion    . Colonoscopy  05/2011    Dr. Allena Katz: normal. no bx.    Cristela Felt, MS Dietetic Intern Pager: [phone]

## 2014-09-04 NOTE — Progress Notes (Signed)
ANTICOAGULATION CONSULT NOTE - follow up  Pharmacy Consult for Coumadin (chronic Rx PTA) Indication: atrial fibrillation  No Known Allergies  Patient Measurements: Height: 5\' 3"  (160 cm) Weight: 122 lb 9.6 oz (55.611 kg) IBW/kg (Calculated) : 52.4  Vital Signs: Temp: 98.1 F (36.7 C) (01/26 0500) Temp Source: Oral (01/26 0500) BP: 146/72 mmHg (01/26 0500) Pulse Rate: 68 (01/26 0500)  Labs:  Recent Labs  09/03/14 1207 09/04/14 0521  HGB 10.8* 11.5*  HCT 34.3* 36.9  PLT 232 229  APTT 41*  --   LABPROT 20.7* 21.5*  INR 1.76* 1.85*  CREATININE 1.81* 1.56*  TROPONINI <0.03  --    Estimated Creatinine Clearance: 22.6 mL/min (by C-G formula based on Cr of 1.56).  Medical History: Past Medical History  Diagnosis Date  . Sinoatrial node dysfunction      SINUS BRADYCARDIA  . Hypertension     unspecified  . Atrial fibrillation   . Encounter for long-term (current) use of other medications     COUMADIN THERAPY  . Lower extremity weakness   . Sinus bradycardia   . Coronary artery disease   . Bilateral renal artery stenosis     MODERATE by dopplers August 2012.  Marland Kitchen Chronic renal insufficiency   . Dementia    Medications:  Prescriptions prior to admission  Medication Sig Dispense Refill Last Dose  . albuterol-ipratropium (COMBIVENT) 18-103 MCG/ACT inhaler Inhale 2 puffs into the lungs every 6 (six) hours as needed for wheezing or shortness of breath. 1 Inhaler 2 09/03/2014 at Unknown time  . ALPRAZolam (XANAX) 0.5 MG tablet Take 0.25 mg by mouth at bedtime as needed for anxiety.   09/02/2014 at Unknown time  . amLODipine (NORVASC) 10 MG tablet Take 1 tablet (10 mg total) by mouth daily. 30 tablet 6 09/03/2014 at Unknown time  . Biotin 5000 MCG TABS Take 1 tablet by mouth daily.    09/03/2014 at Unknown time  . Calcium Carbonate-Vitamin D (CALTRATE 600+D PO) Take 1 tablet by mouth 2 (two) times daily.    09/03/2014 at Unknown time  . carvedilol (COREG) 6.25 MG tablet Take 0.5  tablets (3.125 mg total) by mouth 2 (two) times daily.   09/03/2014 at 0900  . diltiazem (CARDIZEM) 30 MG tablet Take 1 tablet (30 mg total) by mouth 2 (two) times daily. 60 tablet 6 09/03/2014 at Unknown time  . donepezil (ARICEPT) 10 MG tablet Take 1 tablet by mouth at bedtime.  6 09/02/2014 at Unknown time  . dronabinol (MARINOL) 2.5 MG capsule Take 1 capsule by mouth daily.  0 09/02/2014 at Unknown time  . furosemide (LASIX) 20 MG tablet Take 20mg  alternating with 40mg  every other day (Patient taking differently: Take 20-40 mg by mouth See admin instructions. Take 20mg  alternating with 40mg  every other day)   09/03/2014 at Unknown time  . HYDROcodone-acetaminophen (NORCO) 7.5-325 MG per tablet Take 1 tablet by mouth every 8 (eight) hours as needed for moderate pain or severe pain.   09/03/2014 at Unknown time  . memantine (NAMENDA) 10 MG tablet Take 1 tablet by mouth 2 (two) times daily.  3 09/03/2014 at Unknown time  . Multiple Vitamin (MULTIVITAMIN) tablet Take 1 tablet by mouth 2 (two) times daily.    09/03/2014 at Unknown time  . omeprazole (PRILOSEC) 40 MG capsule Take 1 capsule (40 mg total) by mouth daily. 30 capsule 3 09/03/2014 at Unknown time  . potassium chloride (K-DUR) 10 MEQ tablet Take 2 tablets (20 mEq total) by mouth daily.  60 tablet 2 09/03/2014 at Unknown time  . warfarin (COUMADIN) 2 MG tablet Take 0.5-1 tablets (1-2 mg total) by mouth every evening. Takes one half tablet (  total) on Tuesdays and Thursdays, then takes one tablet (  total) on all other days 90 tablet 2 09/02/2014 at Unknown time  . HYDROcodone-homatropine (HYCODAN) 5-1.5 MG/5ML syrup Take 5 mLs by mouth every 6 (six) hours as needed for cough. (Patient not taking: Reported on 09/03/2014) 180 mL 0 Completed Course at Unknown time   Assessment: 81yo female on chronic Coumadin PTA for h/o afib.  INR subtherapeutic but appears to be trending up.  Home dose listed above.  Goal of Therapy:  INR 2-3 Monitor platelets by  anticoagulation protocol: Yes   Plan:  Coumadin 2.5mg  today x 1 (slightly higher dose than home regimen) INR daily  [name], [name] 09/04/2014,10:53 AM

## 2014-09-04 NOTE — Care Management Note (Addendum)
    Page 1 of 2   09/07/2014     10:45:43 AM CARE MANAGEMENT NOTE 09/07/2014  Patient:  [name], [name]   Account Number:  [id number]  Date Initiated:  09/04/2014  Documentation initiated by:  Kathyrn Sheriff  Subjective/Objective Assessment:   Pt is from home, lives with husband and requires minimal assistance at baseline. Pt has CAP aid that come 7 days a week for 4 hours a day. Pt has a walker that she uses. Pt lives in [place] and has used [facility] in the past.     Action/Plan:   Pt plans to return home a discharge. ? home O2 assessment and PT eval prior to discharge. Will cont to follow for CM needs.   Anticipated DC Date:  09/06/2014   Anticipated DC Plan:  HOME/SELF CARE      DC Planning Services  CM consult      [facility] Choice  HOME HEALTH   Choice offered to / List presented to:  C-1 Patient        HH arranged  HH-1 RN  HH-10 DISEASE MANAGEMENT  HH-2 PT      HH agency  [facility]   Status of service:  Completed, signed off Medicare Important Message given?  YES (If response is "NO", the following Medicare IM given date fields will be blank) Date Medicare IM given:  09/07/2014 Medicare IM given by:  Sharrie Rothman Date Additional Medicare IM given:   Additional Medicare IM given by:    Discharge Disposition:  HOME W HOME HEALTH SERVICES  Per UR Regulation:    If discussed at Long Length of Stay Meetings, dates discussed:    Comments:  09/07/14 1040 Arlyss Queen, RN BSN CM Anticipate discharge today with [facility] Rn and Pt with [facility] (per pts choice). Orders will be faxed once written to [facility]. HH services to start within 48 hours of discharge. No DME needs noted. Pt does not qualify for home O2. Pt and pts nurse aware of discharge arrangements.  09/04/2014 1400 Kathyrn Sheriff, RN, MSN, [facility]

## 2014-09-04 NOTE — Evaluation (Signed)
Occupational Therapy Evaluation Patient Details Name: [name] MRN: [id number] DOB: Feb 21, 1929 Today's Date: 09/04/2014    History of Present Illness Patient presenting to ED with SOB. Onset last night. Used husbands nebulizer this morning w/ benefit. Associated w/ cough and increasing LE edema for several months. Wt gain over past several days. Denies orthopnea, fevers, rinorrhea, cough, CP, Palpitations, HA, syncope.   Clinical Impression   Patient in bed upon therapy arrival. Sister present during evaluation and provided background information when able. Pt appears to be functioning at baseline. Pt was receiving a home health aide for the past 2 weeks to assist with bathing and dressing when needed. Pt also states that she has been getting Home health PT as well. No further OT needs at this time.     Follow Up Recommendations  No OT follow up    Equipment Recommendations  None recommended by OT       Precautions / Restrictions Precautions Precautions: Fall Restrictions Weight Bearing Restrictions: No      Mobility Bed Mobility Overal bed mobility: Independent                Transfers Overall transfer level: Needs assistance Equipment used: Rolling walker (2 wheeled) Transfers: Sit to/from [employer] Sit to Stand: Supervision Stand pivot transfers: Supervision       General transfer comment: Patient was supervision for functional transfers. Required cues for safety to use walker.          ADL Overall ADL's : At baseline                                                       Pertinent Vitals/Pain Pain Assessment: 0-10 Pain Score: 3  Pain Location: back Pain Descriptors / Indicators: Constant;Aching Pain Intervention(s): Repositioned     Hand Dominance Right   Extremity/Trunk Assessment Upper Extremity Assessment Upper Extremity Assessment: Overall WFL for tasks assessed   Lower Extremity  Assessment Lower Extremity Assessment: Defer to PT evaluation       Communication Communication Communication: No difficulties   Cognition Arousal/Alertness: Awake/alert Behavior During Therapy: WFL for tasks assessed/performed Overall Cognitive Status: History of cognitive impairments - at baseline                                Home Living Family/patient expects to be discharged to:: Private residence Living Arrangements: Spouse/significant other   Type of Home: House Home Access: Stairs to enter [employer] of Steps: 1   Home Layout: Two level;Able to live on main level with bedroom/bathroom     Bathroom Shower/Tub: Chief Strategy Officer: Standard     Home Equipment: Emergency planning/management officer - 2 wheels;Cane - single point   Additional Comments: Sister and patient report that she was currently receiving Home Health PT and a Home Healh Aide for the past 2 weeks. Aide was helping with dressing and bathing.       Prior Functioning/Environment Level of Independence: Needs assistance  Gait / Transfers Assistance Needed: Pt uses walker mainly for ambulation in home.  ADL's / Homemaking Assistance Needed: Husband completes cooking and cleaning tasks. Husband also helps with LB dressing. For the past 2 weeks patient was having a Home Health aide assist with bathing and dressing.  End of Session Equipment Utilized During Treatment: Rolling walker  Activity Tolerance: Patient tolerated treatment well Patient left: in chair;with call bell/phone within reach;with family/visitor present   Time: 1030-1105 OT Time Calculation (min): 35 min Charges:  OT General Charges $OT Visit: 1 Procedure OT Evaluation $Initial OT Evaluation Tier I: 1 Procedure G-Codes:    Limmie Patricia, OTR/L,CBIS  [phone]  09/04/2014, 11:13 AM

## 2014-09-04 NOTE — Progress Notes (Signed)
Patient is wheezing on assessment, per patients husband this is new, paged the on-call MD to request breathing treatments, will follow orders given and continue to monitor patient.

## 2014-09-04 NOTE — Care Management Utilization Note (Signed)
UR complete 

## 2014-09-04 NOTE — Evaluation (Signed)
Physical Therapy Evaluation Patient Details Name: [name] MRN: [id number] DOB: 05-04-1929 Today's Date: 09/04/2014   History of Present Illness  Patient presenting to ED with SOB. Onset last night. Used husbands nebulizer this morning w/ benefit. Associated w/ cough and increasing LE edema for several months. Wt gain over past several days. Denies orthopnea, fevers, rinorrhea, cough, CP, Palpitations, HA, syncope.  She has the assist of a CAP aide daily.  She ambulates with a walker.  Clinical Impression  Pt was seen for evaluation.  She was alert, oriented and very cooperative.  She describes feeling generally weakened from her norm.  She currently needs supplemental O2 to maintain O2 sat in the 90s.  Her strength is WNL and she did not require physical assist with transfers.  She was able to ambulate 300' with a walker, good stability.  Because pt feels much weaker than normal we discussed PT after d/c.  Initially, I recommended OP PT but her sister felt that she needed HHPT.  We can let the HHPT assess what is needed at the time of d/c.  I will transition her to nursing service for gait in the hallway.    Follow Up Recommendations Home health PT    Equipment Recommendations  None recommended by PT    Recommendations for Other Services   none    Precautions / Restrictions Precautions Precautions: Fall Restrictions Weight Bearing Restrictions: No      Mobility  Bed Mobility Overal bed mobility: Independent                Transfers Overall transfer level: Modified independent Equipment used: Rolling walker (2 wheeled) Transfers: Sit to/from Stand Sit to Stand: Modified independent (Device/Increase time)            Ambulation/Gait Ambulation/Gait assistance: Supervision Ambulation Distance (Feet): 300 Feet Assistive device: Rolling walker (2 wheeled) Gait Pattern/deviations: WFL(Within Functional Limits)   Gait velocity interpretation: at or above  normal speed for age/gender General Gait Details: pt on 2 L O2 during gait...O2 sat = 88% after gait  Stairs            Wheelchair Mobility    Modified Rankin (Stroke Patients Only)       Balance Overall balance assessment: Modified Independent                                           Pertinent Vitals/Pain Pain Assessment: No/denies pain    Home Living Family/patient expects to be discharged to:: Private residence Living Arrangements: Spouse/significant other Available Help at Discharge: Family;Available 24 hours/day Type of Home: House Home Access: Stairs to enter Entrance Stairs-Rails: None Entrance Stairs-Number of Steps: 1 Home Layout: Two level;Able to live on main level with bedroom/bathroom Home Equipment: Shower seat;Walker - 2 wheels;Cane - single point      Prior Function Level of Independence: Needs assistance   Gait / Transfers Assistance Needed: Pt uses walker mainly for ambulation in home.   ADL's / Homemaking Assistance Needed: Husband completes cooking and cleaning tasks. Husband also helps with LB dressing. For the past 2 weeks patient was having a Home Health aide assist with bathing and dressing.         Hand Dominance   Dominant Hand: Right    Extremity/Trunk Assessment               Lower Extremity Assessment: Generalized weakness  Communication   Communication: No difficulties  Cognition Arousal/Alertness: Awake/alert Behavior During Therapy: WFL for tasks assessed/performed Overall Cognitive Status: Within Functional Limits for tasks assessed                      General Comments      Exercises        Assessment/Plan    PT Assessment All further PT needs can be met in the next venue of care  PT Diagnosis     PT Problem List Decreased strength;Decreased mobility;Decreased activity tolerance  PT Treatment Interventions     PT Goals (Current goals can be found in the Care Plan  section) Acute Rehab PT Goals PT Goal Formulation: All assessment and education complete, DC therapy    Frequency     Barriers to discharge  none      Co-evaluation               End of Session Equipment Utilized During Treatment: Gait belt;Oxygen Activity Tolerance: Patient tolerated treatment well Patient left: in chair;with call bell/phone within reach;with family/visitor present Nurse Communication: Mobility status         Time: 8144-8185 PT Time Calculation (min) (ACUTE ONLY): 34 min   Charges:   PT Evaluation $Initial PT Evaluation Tier I: 1 Procedure     PT G CodesDemetrios Isaacs L 09/04/2014, 4:11 PM

## 2014-09-05 DIAGNOSIS — F0390 Unspecified dementia without behavioral disturbance: Secondary | ICD-10-CM

## 2014-09-05 LAB — BASIC METABOLIC PANEL
ANION GAP: 9 (ref 5–15)
BUN: 43 mg/dL — AB (ref 6–23)
CHLORIDE: 103 mmol/L (ref 96–112)
CO2: 31 mmol/L (ref 19–32)
CREATININE: 1.29 mg/dL — AB (ref 0.50–1.10)
Calcium: 9.3 mg/dL (ref 8.4–10.5)
GFR calc Af Amer: 43 mL/min — ABNORMAL LOW (ref >90)
GFR calc non Af Amer: 37 mL/min — ABNORMAL LOW (ref >90)
Glucose, Bld: 110 mg/dL — ABNORMAL HIGH (ref 70–99)
Potassium: 4.1 mmol/L (ref 3.5–5.1)
Sodium: 143 mmol/L (ref 135–145)

## 2014-09-05 LAB — PROTIME-INR
INR: 2.02 — AB (ref 0.00–1.49)
Prothrombin Time: 23 seconds — ABNORMAL HIGH (ref 11.6–15.2)

## 2014-09-05 MED ORDER — WARFARIN SODIUM 2 MG PO TABS
2.0000 mg | ORAL_TABLET | Freq: Once | ORAL | Status: AC
  Administered 2014-09-05: 2 mg via ORAL
  Filled 2014-09-05: qty 1

## 2014-09-05 NOTE — Plan of Care (Signed)
Problem: Phase II Progression Outcomes Goal: Walk in hall or up in chair TID Outcome: Completed/Met Date Met:  09/05/14 Patient ambulated in the hallway with two person assist. Patient tolerated ambulation well and had no complaints. Patient ambulated with 2L oxygen nasal canula.

## 2014-09-05 NOTE — Progress Notes (Signed)
[facility] PROGRESS NOTE  [name] BZM:[id number] DOB: 05-17-1929 DOA: 09/03/2014 PCP: Ignatius Specking., MD  Assessment/Plan: Acute on chronic diastolic congestive heart failure. Clinically improving on intravenous Lasix. Daily weights/intake and output has been inconsistent. We'll continue with IV Lasix for now. May need an increase in her baseline dosing on discharge. Follow-up with Dr. Purvis Sheffield on discharge  Debility. Physical therapy consult.  Acute on chronic renal failure. Appears to be improving with diuresis. Continue current treatments.  Dementia. Stable  Atrial fibrillation. Rate controlled. Continue Coumadin.  Hypertension. Stable. Continue current treatments.  Anxiety. Appears stable at this time. Continue current treatments.  Code Status: Full code Family Communication: Discussed with multiple family members at the bedside Disposition Plan: Discharge home once improved   Consultants:    Procedures:  Echo:- Left ventricle: The cavity size was normal. Wall thickness was increased in a pattern of mild LVH. Systolic function was normal. The estimated ejection fraction was in the range of 55% to 60%. - Regional wall motion abnormality: Hypokinesis of the basal-mid anteroseptal myocardium. - Aortic valve: Moderately calcified annulus. Trileaflet; moderately thickened leaflets. There was mild regurgitation. Valve area (VTI): 1.42 cm^2. - Mitral valve: Moderately calcified annulus. Moderately thickened leaflets . There was mild regurgitation. The MR vena contracta is 0.2 cm. - Left atrium: The atrium was severely dilated. - Right ventricle: The cavity size was mildly dilated. Systolic function was mildly reduced. RV TAPSE is 1.3 cm. - Right atrium: The atrium was moderately dilated. - Tricuspid valve: There was moderate regurgitation. - Pulmonary arteries: Systolic pressure was moderately increased. PA peak pressure: 62 mm Hg  (S). - Inferior vena cava: The vessel was dilated. The respirophasic diameter changes were blunted (< 50%), consistent with elevated central venous pressure. - Pericardium, extracardiac: There is a large left pleural effusion. There is a moderate circumferential pericardial effusion. It measures 1.5 cm adjacent to the RV free wall in diastole. There is no evidence of tamponade physiology by echo. - Technically adequate study.  Antibiotics:    HPI/Subjective: Feeling better. Feels breathing is improving. Complains of no appetite.  Objective: Filed Vitals:   09/05/14 1411  BP: 121/53  Pulse: 78  Temp: 98 F (36.7 C)  Resp: 20    Intake/Output Summary (Last 24 hours) at 09/05/14 1934 Last data filed at 09/05/14 1744  Gross per 24 hour  Intake    363 ml  Output    800 ml  Net   -437 ml   Filed Weights   09/03/14 1751 09/04/14 0500 09/05/14 0651  Weight: 55.067 kg (121 lb 6.4 oz) 55.611 kg (122 lb 9.6 oz) 52.3 kg (115 lb 4.8 oz)    Exam:   General:  NAD  Cardiovascular: S1, S2 irregular  Respiratory: crackles at bases  Abdomen: soft, nt, nd, bs+  Musculoskeletal: 1+ edema b/l   Data Reviewed: Basic Metabolic Panel:  Recent Labs Lab 09/03/14 1207 09/04/14 0521 09/05/14 0503  NA 139 143 143  K 4.9 4.8 4.1  CL 106 107 103  CO2 25 28 31   GLUCOSE 271* 109* 110*  BUN 44* 48* 43*  CREATININE 1.81* 1.56* 1.29*  CALCIUM 9.4 9.5 9.3  MG 2.3  --   --    Liver Function Tests:  Recent Labs Lab 09/04/14 0521  AST 132*  ALT 110*  ALKPHOS 126*  BILITOT 0.9  PROT 6.6  ALBUMIN 3.9   No results for input(s): LIPASE, AMYLASE in the last 168 hours. No results for input(s): AMMONIA in  the last 168 hours. CBC:  Recent Labs Lab 09/03/14 1207 09/04/14 0521  WBC 7.4 7.2  NEUTROABS 6.2  --   HGB 10.8* 11.5*  HCT 34.3* 36.9  MCV 83.1 81.5  PLT 232 229   Cardiac Enzymes:  Recent Labs Lab 09/03/14 1207  TROPONINI <0.03   BNP (last 3  results)  Recent Labs  04/13/14 1611 04/18/14 0519  PROBNP 13309.0* 6636.0*   CBG: No results for input(s): GLUCAP in the last 168 hours.  No results found for this or any previous visit (from the past 240 hour(s)).   Studies: No results found.  Scheduled Meds: . antiseptic oral rinse  7 mL Mouth Rinse BID  . carvedilol  3.125 mg Oral BID WC  . diltiazem  30 mg Oral BID  . donepezil  10 mg Oral QHS  . feeding supplement (ENSURE COMPLETE)  237 mL Oral TID BM  . furosemide  40 mg Intravenous BID  . memantine  10 mg Oral BID  . multivitamin with minerals  1 tablet Oral Daily  . pantoprazole  80 mg Oral Daily  . potassium chloride  20 mEq Oral Daily  . sodium chloride  3 mL Intravenous Q12H  . Warfarin - Pharmacist Dosing Inpatient   Does not apply Q24H   Continuous Infusions:   Principal Problem:   Acute on chronic diastolic CHF (congestive heart failure) Active Problems:   HTN (hypertension)   Atrial fibrillation   Dementia   Acute renal failure superimposed on stage 3 chronic kidney disease   GERD without esophagitis   Anxiety   Loss of weight   Physical deconditioning   SOB (shortness of breath)   Protein-calorie malnutrition, severe    Time spent:    MEMON,JEHANZEB  [facility] Pager [phone]. If 7PM-7AM, please contact night-coverage at [email or web address], password [facility] 09/05/2014, 7:34 PM  LOS: 2 days

## 2014-09-05 NOTE — Progress Notes (Signed)
ANTICOAGULATION CONSULT NOTE  Pharmacy Consult for Coumadin  Indication: atrial fibrillation  No Known Allergies  Patient Measurements: Height:  (160 cm) Weight: 115 lb 4.8 oz (52.3 kg) IBW/kg (Calculated) : 52.4  Vital Signs: Temp: 98 F (36.7 C) (01/27 0651) Temp Source: Oral (01/27 0651) BP: 133/77 mmHg (01/27 0651) Pulse Rate: 141 (01/27 0651)  Labs:  Recent Labs  09/03/14 1207 09/04/14 0521 09/05/14 0503  HGB 10.8* 11.5*  --   HCT 34.3* 36.9  --   PLT 232 229  --   APTT 41*  --   --   LABPROT 20.7* 21.5* 23.0*  INR 1.76* 1.85* 2.02*  CREATININE 1.81* 1.56* 1.29*  TROPONINI <0.03  --   --    Estimated Creatinine Clearance: 27.3 mL/min (by C-G formula based on Cr of 1.29).  Medical History: Past Medical History  Diagnosis Date  . Sinoatrial node dysfunction      SINUS BRADYCARDIA  . Hypertension     unspecified  . Atrial fibrillation   . Encounter for long-term (current) use of other medications     COUMADIN THERAPY  . Lower extremity weakness   . Sinus bradycardia   . Coronary artery disease   . Bilateral renal artery stenosis     MODERATE by dopplers August 2012.  Marland Kitchen Chronic renal insufficiency   . Dementia    Medications:  Prescriptions prior to admission  Medication Sig Dispense Refill Last Dose  . albuterol-ipratropium (COMBIVENT) 18-103 MCG/ACT inhaler Inhale 2 puffs into the lungs every 6 (six) hours as needed for wheezing or shortness of breath. 1 Inhaler 2 09/03/2014 at Unknown time  . ALPRAZolam (XANAX) 0.5 MG tablet Take 0.25 mg by mouth at bedtime as needed for anxiety.   09/02/2014 at Unknown time  . amLODipine (NORVASC) 10 MG tablet Take 1 tablet (10 mg total) by mouth daily. 30 tablet 6 09/03/2014 at Unknown time  . Biotin 5000 MCG TABS Take 1 tablet by mouth daily.    09/03/2014 at Unknown time  . Calcium Carbonate-Vitamin D (CALTRATE 600+D PO) Take 1 tablet by mouth 2 (two) times daily.    09/03/2014 at Unknown time  . carvedilol  (COREG) 6.25 MG tablet Take 0.5 tablets (3.125 mg total) by mouth 2 (two) times daily.   09/03/2014 at 0900  . diltiazem (CARDIZEM) 30 MG tablet Take 1 tablet (30 mg total) by mouth 2 (two) times daily. 60 tablet 6 09/03/2014 at Unknown time  . donepezil (ARICEPT) 10 MG tablet Take 1 tablet by mouth at bedtime.  6 09/02/2014 at Unknown time  . dronabinol (MARINOL) 2.5 MG capsule Take 1 capsule by mouth daily.  0 09/02/2014 at Unknown time  . furosemide (LASIX) 20 MG tablet Take  alternating with  every other day (Patient taking differently: Take 20-40 mg by mouth See admin instructions. Take  alternating with  every other day)   09/03/2014 at Unknown time  . HYDROcodone-acetaminophen (NORCO) 7.5-325 MG per tablet Take 1 tablet by mouth every 8 (eight) hours as needed for moderate pain or severe pain.   09/03/2014 at Unknown time  . memantine (NAMENDA) 10 MG tablet Take 1 tablet by mouth 2 (two) times daily.  3 09/03/2014 at Unknown time  . Multiple Vitamin (MULTIVITAMIN) tablet Take 1 tablet by mouth 2 (two) times daily.    09/03/2014 at Unknown time  . omeprazole (PRILOSEC) 40 MG capsule Take 1 capsule (40 mg total) by mouth daily. 30 capsule 3 09/03/2014 at Unknown time  .  potassium chloride (K-DUR) 10 MEQ tablet Take 2 tablets (20 mEq total) by mouth daily. 60 tablet 2 09/03/2014 at Unknown time  . warfarin (COUMADIN) 2 MG tablet Take 0.5-1 tablets (1-2 mg total) by mouth every evening. Takes one half tablet (1mg  total) on Tuesdays and Thursdays, then takes one tablet (2mg  total) on all other days 90 tablet 2 09/02/2014 at Unknown time  . HYDROcodone-homatropine (HYCODAN) 5-1.5 MG/5ML syrup Take 5 mLs by mouth every 6 (six) hours as needed for cough. (Patient not taking: Reported on 09/03/2014) 180 mL 0 Completed Course at Unknown time   Assessment: 81yo female on chronic Coumadin PTA for h/o afib.  Home dose listed above. INR subtherapeutic on admission, but in goal range today.  No bleeding  noted.  Goal of Therapy:  INR 2-3   Plan:  Coumadin 2mg  po x 1 today INR daily  Briany Aye, [facility] 09/05/2014,8:15 AM

## 2014-09-06 DIAGNOSIS — I5033 Acute on chronic diastolic (congestive) heart failure: Secondary | ICD-10-CM | POA: Insufficient documentation

## 2014-09-06 LAB — BASIC METABOLIC PANEL
Anion gap: 9 (ref 5–15)
BUN: 39 mg/dL — ABNORMAL HIGH (ref 6–23)
CHLORIDE: 99 mmol/L (ref 96–112)
CO2: 34 mmol/L — ABNORMAL HIGH (ref 19–32)
CREATININE: 1.28 mg/dL — AB (ref 0.50–1.10)
Calcium: 9.3 mg/dL (ref 8.4–10.5)
GFR calc Af Amer: 44 mL/min — ABNORMAL LOW (ref >90)
GFR calc non Af Amer: 38 mL/min — ABNORMAL LOW (ref >90)
Glucose, Bld: 124 mg/dL — ABNORMAL HIGH (ref 70–99)
POTASSIUM: 3.9 mmol/L (ref 3.5–5.1)
Sodium: 142 mmol/L (ref 135–145)

## 2014-09-06 LAB — PROTIME-INR
INR: 2.19 — ABNORMAL HIGH (ref 0.00–1.49)
PROTHROMBIN TIME: 24.5 s — AB (ref 11.6–15.2)

## 2014-09-06 MED ORDER — WARFARIN SODIUM 2 MG PO TABS
2.0000 mg | ORAL_TABLET | Freq: Once | ORAL | Status: AC
  Administered 2014-09-06: 2 mg via ORAL
  Filled 2014-09-06: qty 1

## 2014-09-06 NOTE — Progress Notes (Signed)
SATURATION QUALIFICATIONS: (This note is used to comply with regulatory documentation for home oxygen)  Patient Saturations on Room Air at Rest = 96%  Patient Saturations on Room Air while Ambulating = 94%  Patient tolerated ambulation with one assist. Patient walked via room air and had no complaints.

## 2014-09-06 NOTE — Progress Notes (Signed)
[facility] PROGRESS NOTE  [name] EXB:[id number] DOB: [birth year] DOA: 09/03/2014 PCP: Ignatius Specking., MD  Assessment/Plan: Acute on chronic diastolic congestive heart failure. Clinically improving on intravenous Lasix. Daily weights/intake and output has been inconsistent. We'll continue with IV Lasix for now since renal function appears to be tolerating diuresis. May need an increase her baseline dosing on discharge. Follow-up with Dr. Purvis Sheffield on discharge. Continue current treatments for now  Debility. Needs home health physical therapy on discharge  Acute on chronic renal failure. Appears to be improving with diuresis. Continue current treatments.  Dementia. Stable  Atrial fibrillation. Rate controlled. Continue Coumadin.  Hypertension. Stable. Continue current treatments.  Anxiety. Appears stable at this time. Continue current treatments.  Code Status: Full code Family Communication: Discussed with multiple family members at the bedside Disposition Plan: Discharge home once improved   Consultants:    Procedures:  Echo:- Left ventricle: The cavity size was normal. Wall thickness was increased in a pattern of mild LVH. Systolic function was normal. The estimated ejection fraction was in the range of 55% to 60%. - Regional wall motion abnormality: Hypokinesis of the basal-mid anteroseptal myocardium. - Aortic valve: Moderately calcified annulus. Trileaflet; moderately thickened leaflets. There was mild regurgitation. Valve area (VTI): 1.42 cm^2. - Mitral valve: Moderately calcified annulus. Moderately thickened leaflets . There was mild regurgitation. The MR vena contracta is 0.2 cm. - Left atrium: The atrium was severely dilated. - Right ventricle: The cavity size was mildly dilated. Systolic function was mildly reduced. RV TAPSE is 1.3 cm. - Right atrium: The atrium was moderately dilated. - Tricuspid valve: There was moderate  regurgitation. - Pulmonary arteries: Systolic pressure was moderately increased. PA peak pressure: 62 mm Hg (S). - Inferior vena cava: The vessel was dilated. The respirophasic diameter changes were blunted (< 50%), consistent with elevated central venous pressure. - Pericardium, extracardiac: There is a large left pleural effusion. There is a moderate circumferential pericardial effusion. It measures 1.5 cm adjacent to the RV free wall in diastole. There is no evidence of tamponade physiology by echo. - Technically adequate study.  Antibiotics:    HPI/Subjective: Feeling better. Appetite improving. Shortness of breath improving.  Objective: Filed Vitals:   09/06/14 1454  BP: 133/87  Pulse: 96  Temp: 98.4 F (36.9 C)  Resp: 20    Intake/Output Summary (Last 24 hours) at 09/06/14 1840 Last data filed at 09/06/14 1811  Gross per 24 hour  Intake    603 ml  Output   1500 ml  Net   -897 ml   Filed Weights   09/04/14 0500 09/05/14 0651 09/06/14 0500  Weight: 55.611 kg (122 lb 9.6 oz) 52.3 kg (115 lb 4.8 oz) 53.706 kg (118 lb 6.4 oz)    Exam:   General:  NAD  Cardiovascular: S1, S2 irregular  Respiratory: crackles at bases  Abdomen: soft, nt, nd, bs+  Musculoskeletal: trace edema b/l   Data Reviewed: Basic Metabolic Panel:  Recent Labs Lab 09/03/14 1207 09/04/14 0521 09/05/14 0503 09/06/14 0531  NA 139 143 143 142  K 4.9 4.8 4.1 3.9  CL 106 107 103 99  CO2 34*  GLUCOSE 271* 109* 110* 124*  BUN 44* 48* 43* 39*  CREATININE 1.81* 1.56* 1.29* 1.28*  CALCIUM 9.4 9.5 9.3 9.3  MG 2.3  --   --   --    Liver Function Tests:  Recent Labs Lab 09/04/14 0521  AST 132*  ALT 110*  ALKPHOS 126*  BILITOT  0.9  PROT 6.6  ALBUMIN 3.9   No results for input(s): LIPASE, AMYLASE in the last 168 hours. No results for input(s): AMMONIA in the last 168 hours. CBC:  Recent Labs Lab 09/03/14 1207 09/04/14 0521  WBC 7.4 7.2  NEUTROABS 6.2   --   HGB 10.8* 11.5*  HCT 34.3* 36.9  MCV 83.1 81.5  PLT 232 229   Cardiac Enzymes:  Recent Labs Lab 09/03/14 1207  TROPONINI <0.03   BNP (last 3 results)  Recent Labs  04/13/14 1611 04/18/14 0519  PROBNP 13309.0* 6636.0*   CBG: No results for input(s): GLUCAP in the last 168 hours.  No results found for this or any previous visit (from the past 240 hour(s)).   Studies: No results found.  Scheduled Meds: . antiseptic oral rinse  7 mL Mouth Rinse BID  . carvedilol  3.125 mg Oral BID WC  . diltiazem  30 mg Oral BID  . donepezil  10 mg Oral QHS  . feeding supplement (ENSURE COMPLETE)  237 mL Oral TID BM  . furosemide  40 mg Intravenous BID  . memantine  10 mg Oral BID  . multivitamin with minerals  1 tablet Oral Daily  . pantoprazole  80 mg Oral Daily  . potassium chloride  20 mEq Oral Daily  . sodium chloride  3 mL Intravenous Q12H  . Warfarin - Pharmacist Dosing Inpatient   Does not apply Q24H   Continuous Infusions:   Principal Problem:   Acute on chronic diastolic CHF (congestive heart failure) Active Problems:   HTN (hypertension)   Atrial fibrillation   Dementia   Acute renal failure superimposed on stage 3 chronic kidney disease   GERD without esophagitis   Anxiety   Loss of weight   Physical deconditioning   SOB (shortness of breath)   Protein-calorie malnutrition, severe   Acute on chronic diastolic congestive heart failure    Time spent:    [facility]  [facility] Pager [phone]. If 7PM-7AM, please contact night-coverage at [email or web address], password [email or web address] 09/06/2014, 6:40 PM  LOS: 3 days

## 2014-09-06 NOTE — Progress Notes (Signed)
ANTICOAGULATION CONSULT NOTE  Pharmacy Consult for Coumadin  Indication: atrial fibrillation  No Known Allergies  Patient Measurements: Height:  (160 cm) Weight: 118 lb 6.4 oz (53.706 kg) IBW/kg (Calculated) : 52.4  Vital Signs: Temp: 98.5 F (36.9 C) (01/28 0500) Temp Source: Oral (01/28 0500) BP: 132/70 mmHg (01/28 0500) Pulse Rate: 110 (01/28 0500)  Labs:  Recent Labs  09/03/14 1207 09/04/14 0521 09/05/14 0503 09/06/14 0531  HGB 10.8* 11.5*  --   --   HCT 34.3* 36.9  --   --   PLT 232 229  --   --   APTT 41*  --   --   --   LABPROT 20.7* 21.5* 23.0* 24.5*  INR 1.76* 1.85* 2.02* 2.19*  CREATININE 1.81* 1.56* 1.29* 1.28*  TROPONINI <0.03  --   --   --    Estimated Creatinine Clearance: 27.5 mL/min (by C-G formula based on Cr of 1.28).  Medical History: Past Medical History  Diagnosis Date  . Sinoatrial node dysfunction      SINUS BRADYCARDIA  . Hypertension     unspecified  . Atrial fibrillation   . Encounter for long-term (current) use of other medications     COUMADIN THERAPY  . Lower extremity weakness   . Sinus bradycardia   . Coronary artery disease   . Bilateral renal artery stenosis     MODERATE by dopplers August 2012.  Marland Kitchen Chronic renal insufficiency   . Dementia    Medications:  Prescriptions prior to admission  Medication Sig Dispense Refill Last Dose  . albuterol-ipratropium (COMBIVENT) 18-103 MCG/ACT inhaler Inhale 2 puffs into the lungs every 6 (six) hours as needed for wheezing or shortness of breath. 1 Inhaler 2 09/03/2014 at Unknown time  . ALPRAZolam (XANAX) 0.5 MG tablet Take 0.25 mg by mouth at bedtime as needed for anxiety.   09/02/2014 at Unknown time  . amLODipine (NORVASC) 10 MG tablet Take 1 tablet (10 mg total) by mouth daily. 30 tablet 6 09/03/2014 at Unknown time  . Biotin 5000 MCG TABS Take 1 tablet by mouth daily.    09/03/2014 at Unknown time  . Calcium Carbonate-Vitamin D (CALTRATE 600+D PO) Take 1 tablet by mouth 2 (two)  times daily.    09/03/2014 at Unknown time  . carvedilol (COREG) 6.25 MG tablet Take 0.5 tablets (3.125 mg total) by mouth 2 (two) times daily.   09/03/2014 at 0900  . diltiazem (CARDIZEM) 30 MG tablet Take 1 tablet (30 mg total) by mouth 2 (two) times daily. 60 tablet 6 09/03/2014 at Unknown time  . donepezil (ARICEPT) 10 MG tablet Take 1 tablet by mouth at bedtime.  6 09/02/2014 at Unknown time  . dronabinol (MARINOL) 2.5 MG capsule Take 1 capsule by mouth daily.  0 09/02/2014 at Unknown time  . furosemide (LASIX) 20 MG tablet Take  alternating with  every other day (Patient taking differently: Take 20-40 mg by mouth See admin instructions. Take  alternating with  every other day)   09/03/2014 at Unknown time  . HYDROcodone-acetaminophen (NORCO) 7.5-325 MG per tablet Take 1 tablet by mouth every 8 (eight) hours as needed for moderate pain or severe pain.   09/03/2014 at Unknown time  . memantine (NAMENDA) 10 MG tablet Take 1 tablet by mouth 2 (two) times daily.  3 09/03/2014 at Unknown time  . Multiple Vitamin (MULTIVITAMIN) tablet Take 1 tablet by mouth 2 (two) times daily.    09/03/2014 at Unknown time  . omeprazole (PRILOSEC) 40  MG capsule Take 1 capsule (40 mg total) by mouth daily. 30 capsule 3 09/03/2014 at Unknown time  . potassium chloride (K-DUR) 10 MEQ tablet Take 2 tablets (20 mEq total) by mouth daily. 60 tablet 2 09/03/2014 at Unknown time  . warfarin (COUMADIN) 2 MG tablet Take 0.5-1 tablets (1-2 mg total) by mouth every evening. Takes one half tablet (1mg  total) on Tuesdays and Thursdays, then takes one tablet (2mg  total) on all other days (Patient taking differently: Take 1-2 mg by mouth every evening. Takes one half tablet (1mg  total) on Fridays, then takes one tablet (2mg  total) on all other days) 90 tablet 2 09/02/2014 at Unknown time  . HYDROcodone-homatropine (HYCODAN) 5-1.5 MG/5ML syrup Take 5 mLs by mouth every 6 (six) hours as needed for cough. (Patient not taking: Reported  on 09/03/2014) 180 mL 0 Completed Course at Unknown time   Assessment: 81yo female on chronic Coumadin PTA for h/o afib.  Home dose listed above. INR subtherapeutic on admission, but in goal range today.  No bleeding noted.  Goal of Therapy:  INR 2-3   Plan:  Coumadin 2mg  po x 1 today (per home regimen) INR daily  Tameshia Bonneville, [facility] 09/06/2014,8:18 AM

## 2014-09-07 LAB — BASIC METABOLIC PANEL
Anion gap: 8 (ref 5–15)
BUN: 40 mg/dL — ABNORMAL HIGH (ref 6–23)
CALCIUM: 9.2 mg/dL (ref 8.4–10.5)
CHLORIDE: 97 mmol/L (ref 96–112)
CO2: 35 mmol/L — ABNORMAL HIGH (ref 19–32)
Creatinine, Ser: 1.5 mg/dL — ABNORMAL HIGH (ref 0.50–1.10)
GFR calc Af Amer: 36 mL/min — ABNORMAL LOW (ref >90)
GFR calc non Af Amer: 31 mL/min — ABNORMAL LOW (ref >90)
Glucose, Bld: 108 mg/dL — ABNORMAL HIGH (ref 70–99)
Potassium: 3.7 mmol/L (ref 3.5–5.1)
Sodium: 140 mmol/L (ref 135–145)

## 2014-09-07 LAB — PROTIME-INR
INR: 1.86 — AB (ref 0.00–1.49)
PROTHROMBIN TIME: 21.6 s — AB (ref 11.6–15.2)

## 2014-09-07 LAB — HEPATIC FUNCTION PANEL
ALBUMIN: 3.3 g/dL — AB (ref 3.5–5.2)
ALT: 42 U/L — ABNORMAL HIGH (ref 0–35)
AST: 24 U/L (ref 0–37)
Alkaline Phosphatase: 102 U/L (ref 39–117)
BILIRUBIN DIRECT: 0.2 mg/dL (ref 0.0–0.5)
Indirect Bilirubin: 0.3 mg/dL (ref 0.3–0.9)
TOTAL PROTEIN: 6.1 g/dL (ref 6.0–8.3)
Total Bilirubin: 0.5 mg/dL (ref 0.3–1.2)

## 2014-09-07 MED ORDER — ENSURE COMPLETE PO LIQD: 237.0000 mL | Freq: Three times a day (TID) | ORAL | Status: DC

## 2014-09-07 MED ORDER — FUROSEMIDE 40 MG PO TABS: 40.0000 mg | ORAL_TABLET | Freq: Every day | ORAL | Status: DC

## 2014-09-07 MED ORDER — WARFARIN SODIUM 5 MG PO TABS: 2.5000 mg | ORAL_TABLET | Freq: Once | ORAL | Status: DC

## 2014-09-07 NOTE — Discharge Summary (Signed)
Physician Discharge Summary  [name] ZOX:[id number] DOB: 1928/08/16 DOA: 09/03/2014  PCP: Ignatius Specking., MD  Admit date: 09/03/2014 Discharge date: 09/07/2014  Time spent: 40 minutes  Recommendations for Outpatient Follow-up:  1. Follow-up with cardiology has been arranged for next week 2. Follow-up primary care physician a 1-2 weeks  Discharge Diagnoses:  Principal Problem:   Acute on chronic diastolic CHF (congestive heart failure) Active Problems:   HTN (hypertension)   Atrial fibrillation   Dementia   Acute renal failure superimposed on stage 3 chronic kidney disease   GERD without esophagitis   Anxiety   Loss of weight   Physical deconditioning   SOB (shortness of breath)   Protein-calorie malnutrition, severe   Acute on chronic diastolic congestive heart failure   Discharge Condition: Improved  Diet recommendation: Low-salt  Filed Weights   09/05/14 0651 09/06/14 0500 09/07/14 0603  Weight: 52.3 kg (115 lb 4.8 oz) 53.706 kg (118 lb 6.4 oz) 53.071 kg (117 lb)    History of present illness:  This patient was advised to the hospital with progressive shortness of breath and worsening lower extremity edema. He was found to have acute on chronic congestive heart failure was admitted for further treatments.  Hospital Course:  She was diuresed with intravenous Lasix and had excellent response. She was initially found to be in acute on chronic renal failure. This is felt to be a cardiorenal syndrome and improved with diuresis. With diuresis, her respiratory status has improved and she is now ambulating on room air without any difficulty. Patient feels ready for discharge home. Her outpatient dose of Lasix was 40 mg and 20 mg on alternate days, has been changed to 40 mg by mouth daily. Close follow-up with cardiology has been scheduled for next week. The remainder of her medical issues remained stable.  Procedures:  Echo: - Left ventricle: The cavity size was normal.  Wall thickness was increased in a pattern of mild LVH. Systolic function was normal. The estimated ejection fraction was in the range of 55% to 60%. - Regional wall motion abnormality: Hypokinesis of the basal-mid anteroseptal myocardium. - Aortic valve: Moderately calcified annulus. Trileaflet; moderately thickened leaflets. There was mild regurgitation. Valve area (VTI): 1.42 cm^2. - Mitral valve: Moderately calcified annulus. Moderately thickened leaflets . There was mild regurgitation. The MR vena contracta is 0.2 cm. - Left atrium: The atrium was severely dilated. - Right ventricle: The cavity size was mildly dilated. Systolic function was mildly reduced. RV TAPSE is 1.3 cm. - Right atrium: The atrium was moderately dilated. - Tricuspid valve: There was moderate regurgitation. - Pulmonary arteries: Systolic pressure was moderately increased. PA peak pressure: 62 mm Hg (S). - Inferior vena cava: The vessel was dilated. The respirophasic diameter changes were blunted (< 50%), consistent with elevated central venous pressure. - Pericardium, extracardiac: There is a large left pleural effusion. There is a moderate circumferential pericardial effusion. It measures 1.5 cm adjacent to the RV free wall in diastole. There is no evidence of tamponade physiology by echo. - Technically adequate study.  Consultations:    Discharge Exam: Filed Vitals:   09/07/14 1347  BP: 110/64  Pulse: 96  Temp: 97.8 F (36.6 C)  Resp: 20    General: NAD Cardiovascular: S1, S2 irregular Respiratory: CTA B  Discharge Instructions   Discharge Instructions    (HEART FAILURE PATIENTS) Call MD:  Anytime you have any of the following symptoms: 1) 3 pound weight gain in 24 hours or 5 pounds in  1 week 2) shortness of breath, with or without a dry hacking cough 3) swelling in the hands, feet or stomach 4) if you have to sleep on extra pillows at night in order to breathe.     Complete by:  As directed      Diet - low sodium heart healthy    Complete by:  As directed      Face-to-face encounter (required for Medicare/Medicaid patients)    Complete by:  As directed   I MEMON,JEHANZEB certify that this patient is under my care and that I, or a nurse practitioner or physician's assistant working with me, had a face-to-face encounter that meets the physician face-to-face encounter requirements with this patient on 09/07/2014. The encounter with the patient was in whole, or in part for the following medical condition(s) which is the primary reason for home health care (List medical condition): congestive heart failure  The encounter with the patient was in whole, or in part, for the following medical condition, which is the primary reason for home health care:  chf exacerbation  I certify that, based on my findings, the following services are medically necessary home health services:  Nursing  Reason for Medically Necessary Home Health Services:  Skilled Nursing- Skilled Assessment/Observation  My clinical findings support the need for the above services:  Shortness of breath with activity  Further, I certify that my clinical findings support that this patient is homebound due to:  Shortness of Breath with activity     Home Health    Complete by:  As directed   To provide the following care/treatments:   PT RN       Increase activity slowly    Complete by:  As directed           Discharge Medication List as of 09/07/2014  3:47 PM    START taking these medications   Details  feeding supplement, ENSURE COMPLETE, (ENSURE COMPLETE) LIQD Take 237 mLs by mouth 3 (three) times daily between meals., Starting 09/07/2014, Until Discontinued, Print      CONTINUE these medications which have CHANGED   Details  furosemide (LASIX) 40 MG tablet Take 1 tablet (40 mg total) by mouth daily., Starting 09/07/2014, Until Discontinued, Print      CONTINUE these medications which have  NOT CHANGED   Details  albuterol-ipratropium (COMBIVENT) 18-103 MCG/ACT inhaler Inhale 2 puffs into the lungs every 6 (six) hours as needed for wheezing or shortness of breath., Starting 04/19/2014, Until Discontinued, Print    ALPRAZolam (XANAX) 0.5 MG tablet Take 0.25 mg by mouth at bedtime as needed for anxiety., Until Discontinued, Historical Med    Biotin 5000 MCG TABS Take 1 tablet by mouth daily. , Until Discontinued, Historical Med    Calcium Carbonate-Vitamin D (CALTRATE 600+D PO) Take 1 tablet by mouth 2 (two) times daily. , Until Discontinued, Historical Med    carvedilol (COREG) 6.25 MG tablet Take 0.5 tablets (3.125 mg total) by mouth 2 (two) times daily., Starting 08/16/2013, Until Discontinued, No Print    diltiazem (CARDIZEM) 30 MG tablet Take 1 tablet (30 mg total) by mouth 2 (two) times daily., Starting 05/03/2014, Until Discontinued, Normal    donepezil (ARICEPT) 10 MG tablet Take 1 tablet by mouth at bedtime., Starting 08/06/2014, Until Discontinued, Historical Med    HYDROcodone-acetaminophen (NORCO) 7.5-325 MG per tablet Take 1 tablet by mouth every 8 (eight) hours as needed for moderate pain or severe pain., Until Discontinued, Historical Med    memantine (NAMENDA)  10 MG tablet Take 1 tablet by mouth 2 (two) times daily., Starting 08/09/2014, Until Discontinued, Historical Med    Multiple Vitamin (MULTIVITAMIN) tablet Take 1 tablet by mouth 2 (two) times daily. , Until Discontinued, Historical Med    omeprazole (PRILOSEC) 40 MG capsule Take 1 capsule (40 mg total) by mouth daily., Starting 05/03/2014, Until Discontinued, Normal    potassium chloride (K-DUR) 10 MEQ tablet Take 2 tablets (20 mEq total) by mouth daily., Starting 04/19/2014, Until Discontinued, Print    warfarin (COUMADIN) 2 MG tablet Take 0.5-1 tablets (1-2 mg total) by mouth every evening. Takes one half tablet (  total) on Tuesdays and Thursdays, then takes one tablet (  total) on all other days,  Starting 08/23/2014, Until Discontinued, Fax      STOP taking these medications     amLODipine (NORVASC) 10 MG tablet      dronabinol (MARINOL) 2.5 MG capsule      HYDROcodone-homatropine (HYCODAN) 5-1.5 MG/5ML syrup        No Known Allergies Follow-up Information    Please follow up.   Contact information:   Judi Saa [phone]      Follow up with VYAS,DHRUV B., MD.   Specialty:  Internal Medicine   Contact information:   [address] [place] [address] [phone]       Follow up with Joni Reining, NP On 09/13/2014.   Specialty:  Nurse Practitioner   Why:  3:15pm   Contact information:   [address] [place] [address] [phone]        The results of significant diagnostics from this hospitalization (including imaging, microbiology, ancillary and laboratory) are listed below for reference.    Significant Diagnostic Studies: Dg Chest Portable 1 View  09/03/2014   CLINICAL DATA:  Shortness of breath for a couple of days. Initial encounter.  EXAM: PORTABLE CHEST - 1 VIEW  COMPARISON:  CT chest 04/13/2014  FINDINGS: Bilateral interstitial thickening. Trace right pleural effusion. Small left pleural effusion. No pneumothorax. Left basilar airspace disease likely reflecting atelectasis. No other focal parenchymal opacity. Stable cardiomediastinal silhouette. Prominence of the central pulmonary vasculature. No acute osseous abnormality.  IMPRESSION: Findings most consistent with CHF.   Electronically Signed   By: Elige Ko   On: 09/03/2014 12:32    Microbiology: No results found for this or any previous visit (from the past 240 hour(s)).   Labs: Basic Metabolic Panel:  Recent Labs Lab 09/03/14 1207 09/04/14 0521 09/05/14 0503 09/06/14 0531 09/07/14 0530  NA 139 143 143 142 140  K 4.9 4.8 4.1 3.9 3.7  CL 106 107 103 99 97  CO2 34* 35*  GLUCOSE 271* 109* 110* 124* 108*  BUN 44* 48* 43* 39* 40*  CREATININE 1.81* 1.56*  1.29* 1.28* 1.50*  CALCIUM 9.4 9.5 9.3 9.3 9.2  MG 2.3  --   --   --   --    Liver Function Tests:  Recent Labs Lab 09/04/14 0521 09/07/14 0530  AST 132* 24  ALT 110* 42*  ALKPHOS 126* 102  BILITOT 0.9 0.5  PROT 6.6 6.1  ALBUMIN 3.9 3.3*   No results for input(s): LIPASE, AMYLASE in the last 168 hours. No results for input(s): AMMONIA in the last 168 hours. CBC:  Recent Labs Lab 09/03/14 1207 09/04/14 0521  WBC 7.4 7.2  NEUTROABS 6.2  --   HGB 10.8* 11.5*  HCT 34.3* 36.9  MCV 83.1 81.5  PLT 232 229  Cardiac Enzymes:  Recent Labs Lab 09/03/14 1207  TROPONINI <0.03   BNP: BNP (last 3 results)  Recent Labs  04/13/14 1611 04/18/14 0519  PROBNP 13309.0* 6636.0*   CBG: No results for input(s): GLUCAP in the last 168 hours.     Signed:  MEMON,JEHANZEB  [facility] 09/07/2014, 7:24 PM

## 2014-09-07 NOTE — Progress Notes (Signed)
Patient discharged with instructions, prescription, and care notes.  Verbalized understanding via teach back.  IV was removed and the site was WNL. Patient voiced no further complaints or concerns at the time of discharge.  Appointments scheduled per instructions.  Patient left the floor via w/c with staff and family in stable condition. 

## 2014-09-07 NOTE — Progress Notes (Signed)
NUTRITION FOLLOW-UP  INTERVENTION: Continue Ensure TID  NUTRITION DIAGNOSIS: Inadequate oral intake related to poor appetite as evidenced by decreased PO intake for >2 months.   Goal: Pt to meet >/= 90% of estimated energy requirements; ongoing  Monitor:  PO intake, weight, labs  ASSESSMENT: 81 y/o female with history of CHF who was admitted to the ED complaining of SOB with onset last night. Husband states she has received breathing treatments at hospital before but does not use them at home. Pt reports associated cough and abdominal pain. She notes that she has been having memory problems and  suspects she is developing dementia; being followed by both cardiologist and primary care doctor. Family reports she is prone to falling.  She has noticed an appetite change, and confirms abdominal pain. Currently receiving Ensure TID, pantoprazole   Pt has been eating well, with PO intake 75-100%. Pt reports good appetite. She has no complaints about swallowing or chewing difficulties. Denies any nausea, vomiting, diarrhea or abdominal pain.   No changes in current energy needs. Continue encouraging Ensure PO intake.  Labs show poor BUN/creatinine ratio Continue Ensure TID   Height: Ht Readings from Last 1 Encounters:  09/03/14 5\' 3"  (1.6 m)    Weight: Wt Readings from Last 1 Encounters:  09/04/14 122 lb 9.6 oz (55.611 kg)    BMI: Body mass index is 21.72 kg/(m^2).  Estimated Nutritional Needs: Kcal: 1300-1500  Protein: 72-90 grams Fluid: 1.3 L  Skin: intact  Diet Order: Heart Healthy  Intake/Output Summary (Last 24 hours) at 09/04/14 0901 Last data filed at 09/04/14 0300  Gross per 24 hour  Intake  360 ml  Output  450 ml  Net  -90 ml    Last BM: 1/28   Labs:   Last Labs      Recent Labs Lab 09/03/14 1207 09/04/14 0521  NA 139 143  K 4.9 4.8  CL 106 107  CO2 25 28  BUN 44* 48*  CREATININE 1.81* 1.56*   CALCIUM 9.4 9.5  MG 2.3 --   GLUCOSE 271* 109*      CBG (last 3)   Recent Labs (last 2 labs)     No results for input(s): GLUCAP in the last 72 hours.    Scheduled Meds: . antiseptic oral rinse 7 mL Mouth Rinse BID  . carvedilol 3.125 mg Oral BID WC  . diltiazem 30 mg Oral BID  . donepezil 10 mg Oral QHS  . feeding supplement (ENSURE COMPLETE) 237 mL Oral TID BM  . furosemide 40 mg Intravenous BID  . memantine 10 mg Oral BID  . pantoprazole 80 mg Oral Daily  . potassium chloride 20 mEq Oral Daily  . sodium chloride 3 mL Intravenous Q12H  . Warfarin - Pharmacist Dosing Inpatient  Does not apply Q24H    Continuous Infusions:   Past Medical History  Diagnosis Date  . Sinoatrial node dysfunction     SINUS BRADYCARDIA  . Hypertension     unspecified  . Atrial fibrillation   . Encounter for long-term (current) use of other medications     COUMADIN THERAPY  . Lower extremity weakness   . Sinus bradycardia   . Coronary artery disease   . Bilateral renal artery stenosis     MODERATE by dopplers August 2012.  Marland Kitchen Chronic renal insufficiency   . Dementia    Cristela Felt, MS Dietetic Intern Pager: [phone]

## 2014-09-07 NOTE — Progress Notes (Signed)
ANTICOAGULATION CONSULT NOTE  Pharmacy Consult for Coumadin  Indication: atrial fibrillation  No Known Allergies  Patient Measurements: Height: 5\' 3"  (160 cm) Weight: 117 lb (53.071 kg) IBW/kg (Calculated) : 52.4  Vital Signs: Temp: 97.6 F (36.4 C) (01/29 0603) Temp Source: Oral (01/29 0603) BP: 121/71 mmHg (01/29 0603) Pulse Rate: 68 (01/29 0603)  Labs:  Recent Labs  09/05/14 0503 09/06/14 0531 09/07/14 0530  LABPROT 23.0* 24.5* 21.6*  INR 2.02* 2.19* 1.86*  CREATININE 1.29* 1.28* 1.50*   Estimated Creatinine Clearance: 23.5 mL/min (by C-G formula based on Cr of 1.5).  Medical History: Past Medical History  Diagnosis Date  . Sinoatrial node dysfunction      SINUS BRADYCARDIA  . Hypertension     unspecified  . Atrial fibrillation   . Encounter for long-term (current) use of other medications     COUMADIN THERAPY  . Lower extremity weakness   . Sinus bradycardia   . Coronary artery disease   . Bilateral renal artery stenosis     MODERATE by dopplers August 2012.  Marland Kitchen Chronic renal insufficiency   . Dementia    Medications:  Prescriptions prior to admission  Medication Sig Dispense Refill Last Dose  . albuterol-ipratropium (COMBIVENT) 18-103 MCG/ACT inhaler Inhale 2 puffs into the lungs every 6 (six) hours as needed for wheezing or shortness of breath. 1 Inhaler 2 09/03/2014 at Unknown time  . ALPRAZolam (XANAX) 0.5 MG tablet Take 0.25 mg by mouth at bedtime as needed for anxiety.   09/02/2014 at Unknown time  . amLODipine (NORVASC) 10 MG tablet Take 1 tablet (10 mg total) by mouth daily. 30 tablet 6 09/03/2014 at Unknown time  . Biotin 5000 MCG TABS Take 1 tablet by mouth daily.    09/03/2014 at Unknown time  . Calcium Carbonate-Vitamin D (CALTRATE 600+D PO) Take 1 tablet by mouth 2 (two) times daily.    09/03/2014 at Unknown time  . carvedilol (COREG) 6.25 MG tablet Take 0.5 tablets (3.125 mg total) by mouth 2 (two) times daily.   09/03/2014 at 0900  . diltiazem  (CARDIZEM) 30 MG tablet Take 1 tablet (30 mg total) by mouth 2 (two) times daily. 60 tablet 6 09/03/2014 at Unknown time  . donepezil (ARICEPT) 10 MG tablet Take 1 tablet by mouth at bedtime.  6 09/02/2014 at Unknown time  . dronabinol (MARINOL) 2.5 MG capsule Take 1 capsule by mouth daily.  0 09/02/2014 at Unknown time  . furosemide (LASIX) 20 MG tablet Take 20mg  alternating with 40mg  every other day (Patient taking differently: Take 20-40 mg by mouth See admin instructions. Take 20mg  alternating with 40mg  every other day)   09/03/2014 at Unknown time  . HYDROcodone-acetaminophen (NORCO) 7.5-325 MG per tablet Take 1 tablet by mouth every 8 (eight) hours as needed for moderate pain or severe pain.   09/03/2014 at Unknown time  . memantine (NAMENDA) 10 MG tablet Take 1 tablet by mouth 2 (two) times daily.  3 09/03/2014 at Unknown time  . Multiple Vitamin (MULTIVITAMIN) tablet Take 1 tablet by mouth 2 (two) times daily.    09/03/2014 at Unknown time  . omeprazole (PRILOSEC) 40 MG capsule Take 1 capsule (40 mg total) by mouth daily. 30 capsule 3 09/03/2014 at Unknown time  . potassium chloride (K-DUR) 10 MEQ tablet Take 2 tablets (20 mEq total) by mouth daily. 60 tablet 2 09/03/2014 at Unknown time  . warfarin (COUMADIN) 2 MG tablet Take 0.5-1 tablets (1-2 mg total) by mouth every evening. Takes one  half tablet (  total) on Tuesdays and Thursdays, then takes one tablet (  total) on all other days (Patient taking differently: Take 1-2 mg by mouth every evening. Takes one half tablet (  total) on Fridays, then takes one tablet (  total) on all other days) 90 tablet 2 09/02/2014 at Unknown time  . HYDROcodone-homatropine (HYCODAN) 5-1.5 MG/5ML syrup Take 5 mLs by mouth every 6 (six) hours as needed for cough. (Patient not taking: Reported on 09/03/2014) 180 mL 0 Completed Course at Unknown time   Assessment: 81yo female on chronic Coumadin PTA for h/o afib.  Home dose listed above. INR subtherapeutic.  No  bleeding noted.  Goal of Therapy:  INR 2-3   Plan:  Coumadin 2.5mg  po today x 1 to boost INR INR daily  [name], Jabar Krysiak A 09/07/2014,10:02 AM

## 2014-09-07 NOTE — Progress Notes (Signed)
Notified Dr. Kerry Hough that the patient was ambulated greater than 250 feet.  The patient tolerated the walk well AEB no c/o or visible SOB, pain or weakness noted.

## 2014-09-10 NOTE — Care Management Utilization Note (Signed)
UR completed 

## 2014-09-12 ENCOUNTER — Telehealth: Payer: Self-pay | Admitting: Cardiovascular Disease

## 2014-09-12 NOTE — Telephone Encounter (Signed)
Spoke with pharmacist and informed her that patient is currently on furosemide 40 mg daily per hospital d/c.

## 2014-09-12 NOTE — Telephone Encounter (Signed)
[facility] called requesting verification of patients Lasix.

## 2014-09-13 ENCOUNTER — Ambulatory Visit (INDEPENDENT_AMBULATORY_CARE_PROVIDER_SITE_OTHER): Payer: Medicare Other | Admitting: *Deleted

## 2014-09-13 ENCOUNTER — Encounter: Payer: Self-pay | Admitting: Adult Health

## 2014-09-13 ENCOUNTER — Ambulatory Visit (INDEPENDENT_AMBULATORY_CARE_PROVIDER_SITE_OTHER): Payer: Medicare Other | Admitting: Adult Health

## 2014-09-13 VITALS — BP 124/90 | HR 75 | Ht 63.0 in | Wt 117.0 lb

## 2014-09-13 DIAGNOSIS — I4891 Unspecified atrial fibrillation: Secondary | ICD-10-CM | POA: Diagnosis not present

## 2014-09-13 DIAGNOSIS — I10 Essential (primary) hypertension: Secondary | ICD-10-CM

## 2014-09-13 DIAGNOSIS — Z7901 Long term (current) use of anticoagulants: Secondary | ICD-10-CM | POA: Diagnosis not present

## 2014-09-13 DIAGNOSIS — N181 Chronic kidney disease, stage 1: Secondary | ICD-10-CM

## 2014-09-13 DIAGNOSIS — Z5181 Encounter for therapeutic drug level monitoring: Secondary | ICD-10-CM

## 2014-09-13 DIAGNOSIS — I480 Paroxysmal atrial fibrillation: Secondary | ICD-10-CM

## 2014-09-13 DIAGNOSIS — I5033 Acute on chronic diastolic (congestive) heart failure: Secondary | ICD-10-CM

## 2014-09-13 LAB — POCT INR: INR: 1.7

## 2014-09-13 NOTE — Progress Notes (Signed)
Cardiology Office Note   Date:  09/13/2014   ID:  ATLEE MCADOO, DOB 04/25/1929, MRN [id number]  PCP:  Ignatius Specking., MD  Cardiologist:  Inis Sizer, NP   Chief Complaint  Patient presents with  . Atrial Fibrillation  . Congestive Heart Failure  . Hypertension      History of Present Illness: [name] is a 81 y.o. female who presents for ongoing assessment and management of atrial fib, on Coumadin therapy,CHADS VASC score of of 5.also history of chronic diastolic heart failure, and hypertension.  The patient was last seen in the office in November 2015 by Dr. Purvis Sheffield.  At that time, the patient was doing well.  It was noted that she had abnormal PFTs, and amiodarone had been discontinued.  She was continued on by mouth diuretics, with good weight control.  It was noted in the setting of hypertension, she was found to have unilateral renal artery stenosis.    She comes today after recently being readmitted to and [facility] hospital in the setting of decompensated heart failure.  The patient had been on every other day diuretic therapy, but this was changed to 40 mg of Lasix daily.  Since that, time.  She has felt better, is not retaining any fluid, is breathing better, but names overall deconditioned.  She also states that she is not eating well because all of the food tastes the same.  She denies dizziness, dyspnea on exertion, or chest pain. She is due to have her INR checked in [place] office today, but is in our office in [place] today and will have it checked here.    Past Medical History  Diagnosis Date  . Sinoatrial node dysfunction      SINUS BRADYCARDIA  . Hypertension     unspecified  . Atrial fibrillation   . Encounter for long-term (current) use of other medications     COUMADIN THERAPY  . Lower extremity weakness   . Sinus bradycardia   . Coronary artery disease   . Bilateral renal artery stenosis     MODERATE by dopplers August 2012.    Marland Kitchen Chronic renal insufficiency   . Dementia     Past Surgical History  Procedure Laterality Date  . Knee arthroscopy    . Cardioversion    . Colonoscopy  05/2011    Dr. Allena Katz: normal. no bx.     Current Outpatient Prescriptions  Medication Sig Dispense Refill  . albuterol-ipratropium (COMBIVENT) 18-103 MCG/ACT inhaler Inhale 2 puffs into the lungs every 6 (six) hours as needed for wheezing or shortness of breath. 1 Inhaler 2  . ALPRAZolam (XANAX) 0.5 MG tablet Take 0.25 mg by mouth at bedtime as needed for anxiety.    . Biotin 5000 MCG TABS Take 1 tablet by mouth daily.     . Calcium Carbonate-Vitamin D (CALTRATE 600+D PO) Take 1 tablet by mouth 2 (two) times daily.     . carvedilol (COREG) 6.25 MG tablet Take 0.5 tablets (3.125 mg total) by mouth 2 (two) times daily.    Marland Kitchen diltiazem (CARDIZEM) 30 MG tablet Take 1 tablet (30 mg total) by mouth 2 (two) times daily. 60 tablet 6  . donepezil (ARICEPT) 10 MG tablet Take 1 tablet by mouth at bedtime.  6  . feeding supplement, ENSURE COMPLETE, (ENSURE COMPLETE) LIQD Take 237 mLs by mouth 3 (three) times daily between meals. 90 Bottle 1  . furosemide (LASIX) 40 MG tablet Take 1 tablet (40 mg total)  by mouth daily. 30 tablet 1  . HYDROcodone-acetaminophen (NORCO) 7.5-325 MG per tablet Take 1 tablet by mouth every 8 (eight) hours as needed for moderate pain or severe pain.    . memantine (NAMENDA) 10 MG tablet Take 1 tablet by mouth 2 (two) times daily.  3  . Multiple Vitamin (MULTIVITAMIN) tablet Take 1 tablet by mouth 2 (two) times daily.     Marland Kitchen omeprazole (PRILOSEC) 40 MG capsule Take 1 capsule (40 mg total) by mouth daily. 30 capsule 3  . potassium chloride (K-DUR) 10 MEQ tablet Take 2 tablets (20 mEq total) by mouth daily. 60 tablet 2  . warfarin (COUMADIN) 2 MG tablet Take 0.5-1 tablets (1-2 mg total) by mouth every evening. Takes one half tablet (  total) on Tuesdays and Thursdays, then takes one tablet (  total) on all other days  (Patient taking differently: Take 1-2 mg by mouth every evening. Takes one half tablet (  total) on Fridays, then takes one tablet (  total) on all other days) 90 tablet 2   No current facility-administered medications for this visit.    Allergies:   Review of patient's allergies indicates no known allergies.    Social History:  The patient  reports that she has never smoked. She has never used smokeless tobacco. She reports that she does not drink alcohol or use illicit drugs.   Family History:  The patient's family history includes Cancer in her brother; Cirrhosis in her other; Heart failure in her mother; High blood pressure in her mother.    ROS:  Please see the history of present illness.   Otherwise,   all other systems are reviewed and negative.    PHYSICAL EXAM: VS:  BP 124/90 mmHg  Pulse 75  Ht  (1.6 m)  Wt 117 lb (53.071 kg)  BMI 20.73 kg/m2  SpO2 98% , BMI Body mass index is 20.73 kg/(m^2). GEN: Well nourished, well developed, in no acute distress HEENT: normal Neck: no JVD, carotid bruits, or masses Cardiac: IRRR; no murmurs, rubs, or gallops,no edema  Respiratory:  clear to auscultation bilaterally, normal work of breathing GI: soft, nontender, nondistended, + BS MS: no deformity or atrophy Skin: warm and dry, no rash Neuro:  Strength and sensation are intact Psych: euthymic mood, full affect   Recent Labs: 04/18/2014: Pro B Natriuretic peptide (BNP) 6636.0* 09/03/2014: B Natriuretic Peptide 930.0*; Magnesium 2.3 09/04/2014: Hemoglobin 11.5*; Platelets 229 09/07/2014: ALT 42*; BUN 40*; Creatinine 1.50*; Potassium 3.7; Sodium 140      Wt Readings from Last 3 Encounters:  09/13/14 117 lb (53.071 kg)  09/07/14 117 lb (53.071 kg)  06/22/14 121 lb (54.885 kg)      Other studies Reviewed: Additional studies/ records that were reviewed today include: BMET in one month    ASSESSMENT AND PLAN:     Current medicines are reviewed at length with the  patient today.  No concerns.   Labs/ tests ordered today include: BMET in one month  Orders Placed This Encounter  Procedures  . Basic Metabolic Panel (BMET)     Disposition:   FU with Dr. Purvis Sheffield in 3 months. Signed, Joni Reining, NP  09/13/2014 4:20 PM    [facility] [address] [place], [place], [place]  [address] Phone: [phone]; Fax: [phone]   since

## 2014-09-13 NOTE — Patient Instructions (Addendum)
Your physician recommends that you schedule a follow-up appointment in: 3 months   Your physician recommends that you return for lab work in: 1 month   Your physician recommends that you continue on your current medications as directed. Please refer to the Current Medication list given to you today.  Your INR today is 1.7 I will send a message to Misty Stanley and have her call you with directions   Thank you for choosing [facility]!

## 2014-09-13 NOTE — Assessment & Plan Note (Signed)
Heart rate is well controlled on diltiazem 30 mg twice a day.  She is also on carvedilol 6.25 mg (one half tablet twice a day).  INR is checked here in the office today and she is on Coumadin.  This was 1.7.  Coumadin dosing is being adjusted.  Her CHADS VASC Score 4.

## 2014-09-13 NOTE — Progress Notes (Deleted)
Name: [name]    DOB: 07-06-1929  Age: 81 y.o.  MR#: [id number]       PCP:  Ignatius Specking., MD      Insurance: Payor: MEDICARE / Plan: MEDICARE PART A AND B / Product Type: *No Product type* /   CC:    Chief Complaint  Patient presents with  . Atrial Fibrillation  . Congestive Heart Failure  . Hypertension    VS Filed Vitals:   09/13/14 1507  BP: 124/90  Pulse: 75  Height:  (1.6 m)  Weight: 117 lb (53.071 kg)  SpO2: 98%    Weights Current Weight  09/13/14 117 lb (53.071 kg)  09/07/14 117 lb (53.071 kg)  06/22/14 121 lb (54.885 kg)    Blood Pressure  BP Readings from Last 3 Encounters:  09/13/14 124/90  09/07/14 110/64  06/22/14 113/58     Admit date:  (Not on file) Last encounter with RMR:  Visit date not found   Allergy Review of patient's allergies indicates no known allergies.  Current Outpatient Prescriptions  Medication Sig Dispense Refill  . albuterol-ipratropium (COMBIVENT) 18-103 MCG/ACT inhaler Inhale 2 puffs into the lungs every 6 (six) hours as needed for wheezing or shortness of breath. 1 Inhaler 2  . ALPRAZolam (XANAX) 0.5 MG tablet Take 0.25 mg by mouth at bedtime as needed for anxiety.    . Biotin 5000 MCG TABS Take 1 tablet by mouth daily.     . Calcium Carbonate-Vitamin D (CALTRATE 600+D PO) Take 1 tablet by mouth 2 (two) times daily.     . carvedilol (COREG) 6.25 MG tablet Take 0.5 tablets (3.125 mg total) by mouth 2 (two) times daily.    Marland Kitchen diltiazem (CARDIZEM) 30 MG tablet Take 1 tablet (30 mg total) by mouth 2 (two) times daily. 60 tablet 6  . donepezil (ARICEPT) 10 MG tablet Take 1 tablet by mouth at bedtime.  6  . feeding supplement, ENSURE COMPLETE, (ENSURE COMPLETE) LIQD Take 237 mLs by mouth 3 (three) times daily between meals. 90 Bottle 1  . furosemide (LASIX) 40 MG tablet Take 1 tablet (40 mg total) by mouth daily. 30 tablet 1  . HYDROcodone-acetaminophen (NORCO) 7.5-325 MG per tablet Take 1 tablet by mouth every 8 (eight)  hours as needed for moderate pain or severe pain.    . memantine (NAMENDA) 10 MG tablet Take 1 tablet by mouth 2 (two) times daily.  3  . Multiple Vitamin (MULTIVITAMIN) tablet Take 1 tablet by mouth 2 (two) times daily.     Marland Kitchen omeprazole (PRILOSEC) 40 MG capsule Take 1 capsule (40 mg total) by mouth daily. 30 capsule 3  . potassium chloride (K-DUR) 10 MEQ tablet Take 2 tablets (20 mEq total) by mouth daily. 60 tablet 2  . warfarin (COUMADIN) 2 MG tablet Take 0.5-1 tablets (1-2 mg total) by mouth every evening. Takes one half tablet (  total) on Tuesdays and Thursdays, then takes one tablet (  total) on all other days (Patient taking differently: Take 1-2 mg by mouth every evening. Takes one half tablet (  total) on Fridays, then takes one tablet (  total) on all other days) 90 tablet 2   No current facility-administered medications for this visit.    Discontinued Meds:   There are no discontinued medications.  Patient Active Problem List   Diagnosis Date Noted  . Acute on chronic diastolic congestive heart failure   . Protein-calorie malnutrition, severe 09/04/2014  . Acute on chronic diastolic CHF (congestive heart failure)  09/03/2014  . Dementia 09/03/2014  . Acute renal failure superimposed on stage 3 chronic kidney disease 09/03/2014  . GERD without esophagitis 09/03/2014  . Anxiety 09/03/2014  . Loss of weight 09/03/2014  . Physical deconditioning 09/03/2014  . SOB (shortness of breath)   . C. difficile diarrhea 05/11/2014  . Acute renal failure 04/14/2014  . Acute respiratory failure with hypoxia 04/13/2014  . C. difficile colitis 04/13/2014  . Chronic respiratory failure with hypoxia 04/13/2014  . Chronic diarrhea 03/05/2014  . Encounter for therapeutic drug monitoring 09/15/2013  . Gait difficulty 05/18/2013  . Chronic diastolic heart failure 02/17/2013  . Chronic renal insufficiency   . Peripheral edema 02/03/2013  . Dysphagia 01/12/2011  . Dilated cardiomyopathy  11/04/2010  . Acute renal insufficiency 11/04/2010  . Mitral regurgitation 11/04/2010  . PFO (patent foramen ovale) 11/04/2010  . Chronic anticoagulation 11/04/2010  . Acute on chronic diastolic heart failure 11/04/2010  . Encounter for long-term (current) use of anticoagulants 10/30/2010  . COMPRESSION FRACTURE, SPINE 10/10/2010  . ATHEROSCLEROSIS OF RENAL ARTERY 09/04/2010  . WEAKNESS 04/30/2010  . HTN (hypertension) 05/27/2009  . Atrial fibrillation 05/27/2009  . SINUS BRADYCARDIA 05/27/2009    LABS    Component Value Date/Time   NA 140 09/07/2014 0530   NA 142 09/06/2014 0531   NA 143 09/05/2014 0503   K 3.7 09/07/2014 0530   K 3.9 09/06/2014 0531   K 4.1 09/05/2014 0503   CL 97 09/07/2014 0530   CL 99 09/06/2014 0531   CL 103 09/05/2014 0503   CO2 35* 09/07/2014 0530   CO2 34* 09/06/2014 0531   CO2 31 09/05/2014 0503   GLUCOSE 108* 09/07/2014 0530   GLUCOSE 124* 09/06/2014 0531   GLUCOSE 110* 09/05/2014 0503   BUN 40* 09/07/2014 0530   BUN 39* 09/06/2014 0531   BUN 43* 09/05/2014 0503   CREATININE 1.50* 09/07/2014 0530   CREATININE 1.28* 09/06/2014 0531   CREATININE 1.29* 09/05/2014 0503   CREATININE 1.37* 06/05/2014 1254   CREATININE 1.47* 05/16/2014 1457   CREATININE 1.64* 05/07/2014 1513   CALCIUM 9.2 09/07/2014 0530   CALCIUM 9.3 09/06/2014 0531   CALCIUM 9.3 09/05/2014 0503   GFRNONAA 31* 09/07/2014 0530   GFRNONAA 38* 09/06/2014 0531   GFRNONAA 37* 09/05/2014 0503   GFRAA 36* 09/07/2014 0530   GFRAA 44* 09/06/2014 0531   GFRAA 43* 09/05/2014 0503   CMP     Component Value Date/Time   NA 140 09/07/2014 0530   K 3.7 09/07/2014 0530   CL 97 09/07/2014 0530   CO2 35* 09/07/2014 0530   GLUCOSE 108* 09/07/2014 0530   BUN 40* 09/07/2014 0530   CREATININE 1.50* 09/07/2014 0530   CREATININE 1.37* 06/05/2014 1254   CALCIUM 9.2 09/07/2014 0530   PROT 6.1 09/07/2014 0530   ALBUMIN 3.3* 09/07/2014 0530   AST 24 09/07/2014 0530   ALT 42* 09/07/2014 0530    ALKPHOS 102 09/07/2014 0530   BILITOT 0.5 09/07/2014 0530   GFRNONAA 31* 09/07/2014 0530   GFRAA 36* 09/07/2014 0530       Component Value Date/Time   WBC 7.2 09/04/2014 0521   WBC 7.4 09/03/2014 1207   WBC 13.4* 04/19/2014 0540   HGB 11.5* 09/04/2014 0521   HGB 10.8* 09/03/2014 1207   HGB 11.2* 04/19/2014 0540   HCT 36.9 09/04/2014 0521   HCT 34.3* 09/03/2014 1207   HCT 35.8* 04/19/2014 0540   MCV 81.5 09/04/2014 0521   MCV 83.1 09/03/2014 1207   MCV 80.6  04/19/2014 0540    Lipid Panel  No results found for: CHOL, TRIG, HDL, CHOLHDL, VLDL, LDLCALC, LDLDIRECT  ABG No results found for: PHART, PCO2ART, PO2ART, HCO3, TCO2, ACIDBASEDEF, O2SAT   No results found for: TSH BNP (last 3 results)  Recent Labs  09/03/14 1207  BNP 930.0*    ProBNP (last 3 results)  Recent Labs  04/13/14 1611 04/18/14 0519  PROBNP 13309.0* 6636.0*    Cardiac Panel (last 3 results) No results for input(s): CKTOTAL, CKMB, TROPONINI, RELINDX in the last 72 hours.  Iron/TIBC/Ferritin/ %Sat    Component Value Date/Time   IRON 15* 09/04/2014 0521   TIBC 303 09/04/2014 0521   FERRITIN 41 09/04/2014 0521   IRONPCTSAT 5* 09/04/2014 0521     EKG Orders placed or performed during the hospital encounter of 09/03/14  . ED EKG  . ED EKG  . EKG 12-Lead  . EKG 12-Lead  . EKG     Prior Assessment and Plan Problem List as of 09/13/2014      Cardiovascular and Mediastinum   Mitral regurgitation   Last Assessment & Plan 01/01/2012 Office Visit Written 01/30/2012  3:20 PM by June Leap, MD    No significant mitral regurgitation by physical examination.      PFO (patent foramen ovale)   HTN (hypertension)   Last Assessment & Plan 04/03/2013 Office Visit Written 04/03/2013  2:46 PM by Rande Brunt, PA-C    This has become her most salient issue, and remains quite difficult to manage. Following her last OV, I added HCTZ 12.5 twice a day in an attempt to better control her HTN. However, she  developed worsening renal function (peak creatinine 3.3), necessitating complete cessation of Demadex and HCTZ, with subsequent resumption of lisinopril at a lower dose, following improvement in her creatinine level. This may have been due to her known bilateral RAS, followed by Dr. Clifton James, with whom she is scheduled to follow in the next several weeks. In fact, she is scheduled for a renal artery duplex scan next month. Given this, I recommend she not resume HCTZ, but rather start Norvasc 10 mg daily, and return for BP check here in our clinic later this week.      Atrial fibrillation   Last Assessment & Plan 04/03/2013 Office Visit Written 04/03/2013  3:00 PM by Rande Brunt, PA-C    Maintaining SB on low-dose amiodarone. On Coumadin anticoagulation, followed here in our clinic.      SINUS BRADYCARDIA   Last Assessment & Plan 04/03/2013 Office Visit Written 04/03/2013  2:59 PM by Rande Brunt, PA-C    I believe that this remains a contributing factor in her complaint of dizziness, fatigue, and gait instability. She has now had documented marked sinus bradycardia on several occasions. She remains on low-dose amiodarone and carvedilol, but in addition to Cardizem, which I decreased in the past for this problem. Therefore, I recommend cutting her Cardizem dose in half for 1 week, then stopping it altogether. She is to continue on low-dose amiodarone, for maintenance of NSR, as well as carvedilol. Will reassess her vitals later this week, at which time we should see an improvement in her resting heart rate.       ATHEROSCLEROSIS OF RENAL ARTERY   Last Assessment & Plan 04/26/2012 Office Visit Written 04/26/2012 10:06 AM by Rande Brunt, PA    Followed by Dr. Clifton James in [place]. Conservative management has been recommended.      Dilated cardiomyopathy  Last Assessment & Plan 04/26/2012 Office Visit Written 04/26/2012 10:05 AM by Rande Brunt, PA    Continue current medication regimen,  although unable to further uptitrate carvedilol, which I substituted for labetalol at time of last OV, secondary to resting bradycardia.      Acute on chronic diastolic heart failure   Last Assessment & Plan 01/01/2012 Office Visit Written 01/30/2012  3:16 PM by June Leap, MD    No recurrent heart failure symptoms.  Continue current medical regimen.      Chronic diastolic heart failure   Last Assessment & Plan 04/03/2013 Office Visit Written 04/03/2013  3:01 PM by Rande Brunt, PA-C    Compensated by history and exam. Do not recommend resuming diuretic therapy, for reasons as outlined above.      Acute on chronic diastolic CHF (congestive heart failure)   Acute on chronic diastolic congestive heart failure     Respiratory   Acute respiratory failure with hypoxia   Chronic respiratory failure with hypoxia     Digestive   Dysphagia   Last Assessment & Plan 01/01/2012 Office Visit Written 01/30/2012  3:19 PM by June Leap, MD    Patient reports symptoms of functional dyspepsia.  I scheduled her for a gastric imaging study.  She could have diabetic gastroparesis.      Chronic diarrhea   Last Assessment & Plan 03/05/2014 Office Visit Written 03/05/2014 10:55 AM by Tiffany Kocher, PA-C    81 y/o female with 6 month h/o chronic diarrhea. Reports unremarkable colonoscopy a couple years ago. No obvious medication changes to explain symptoms. No recent antibiotic use. No travel. Differential diagnosis includes microscopic colitis, high on the list. We will check stool studies, CBC, I FOBT. Low likelihood of mesenteric ischemia without abdominal pain or weight loss. If initial work up negative, she may require repeat colonoscopy with random colon biopsies. Recommended she try imodium 2mg  TID prn for now. Further recommendations to follow.       C. difficile colitis   C. difficile diarrhea   Last Assessment & Plan 05/07/2014 Office Visit Written 05/11/2014 10:44 PM by Nira Retort, NP    Initial  onset with positive PCR in Aug 2015 with Flagyl failure, improvement with Vancomycin thereafter. Just complete vanc, with improved frequency of stools but still loose. Need to check Cdiff PCR now. If positive, needs higher dose of vanc with tapering. As of note, last colonoscopy in 2012 at outside facility. If persistent diarrhea but negative Cdiff PCR, consider updated colonoscopy with random colonic biopsies.       GERD without esophagitis     Nervous and Auditory   Dementia     Musculoskeletal and Integument   COMPRESSION FRACTURE, SPINE   Last Assessment & Plan 11/04/2010 Office Visit Written 11/04/2010  4:18 PM by June Leap, MD    Patient will followup in [facility] for consideration of kyphoplasty        Genitourinary   Acute renal insufficiency   Last Assessment & Plan 02/18/2012 Office Visit Written 02/18/2012  2:15 PM by Rande Brunt, PA    Since resolved, with most recent creatinine 0.8 by followup labs in June. Of note, patient has documented 60% bilateral RAS, by Doppler study 2012. Conservative management has been recommended.      Chronic renal insufficiency   Last Assessment & Plan 04/03/2013 Office Visit Written 04/03/2013  2:54 PM by Rande Brunt, PA-C    Will check  followup labs. Most recent assessment indicated improvement in her creatinine level, down to 1.9 (peak 3.3)      Acute renal failure   Acute renal failure superimposed on stage 3 chronic kidney disease     Other   Chronic anticoagulation   Last Assessment & Plan 01/01/2012 Office Visit Written 01/30/2012  3:20 PM by June Leap, MD    No complications.  No problems with bleeding.  Patient recently tripped and had an MRI done of the back.  No hematoma was noted.      WEAKNESS   Encounter for long-term (current) use of anticoagulants   Peripheral edema   Last Assessment & Plan 04/03/2013 Office Visit Written 04/03/2013  2:53 PM by Rande Brunt, PA-C    Based on recent evidence of exacerbation of  chronic kidney disease, with known bilateral RAS, I have elected to not resume diuretic treatment for her chronic peripheral edema. Moreover, she has documented evidence of NL LVF and RVF, by recent echocardiography. Therefore, recommend conservative management employing use of compression hose (which she has used in the past), and elevating her legs whenever possible.      Gait difficulty   Encounter for therapeutic drug monitoring   Anxiety   Loss of weight   Physical deconditioning   SOB (shortness of breath)   Protein-calorie malnutrition, severe       Imaging: Dg Chest Portable 1 View  09/03/2014   CLINICAL DATA:  Shortness of breath for a couple of days. Initial encounter.  EXAM: PORTABLE CHEST - 1 VIEW  COMPARISON:  CT chest 04/13/2014  FINDINGS: Bilateral interstitial thickening. Trace right pleural effusion. Small left pleural effusion. No pneumothorax. Left basilar airspace disease likely reflecting atelectasis. No other focal parenchymal opacity. Stable cardiomediastinal silhouette. Prominence of the central pulmonary vasculature. No acute osseous abnormality.  IMPRESSION: Findings most consistent with CHF.   Electronically Signed   By: Elige Ko   On: 09/03/2014 12:32

## 2014-09-13 NOTE — Assessment & Plan Note (Signed)
She is doing well and compensated concerning fluid status.  She has no appetite, and she states all the food tastes same.  This may be medication induced.  I talked with her about her inhalers and rinsing out her mouth after use.  She will continue on current medications to include to daily Lasix. She will continue daily weights.  Chewable with salt.  We will see her again in 3 months unless she is symptomatic.

## 2014-09-13 NOTE — Assessment & Plan Note (Signed)
Follow up labs in one month.  On discharge, the patient's creatinine was 1.50.

## 2014-09-17 ENCOUNTER — Telehealth: Payer: Self-pay | Admitting: *Deleted

## 2014-09-17 MED ORDER — FUROSEMIDE 40 MG PO TABS: 40.0000 mg | ORAL_TABLET | Freq: Every day | ORAL | Status: DC

## 2014-09-17 NOTE — Telephone Encounter (Signed)
Received refill request for lasix 40 mg daily from [facility]. Noted dose change October 2015.Medication sent to pharmacy.

## 2014-09-27 ENCOUNTER — Ambulatory Visit (INDEPENDENT_AMBULATORY_CARE_PROVIDER_SITE_OTHER): Payer: Medicare Other | Admitting: *Deleted

## 2014-09-27 DIAGNOSIS — Z7901 Long term (current) use of anticoagulants: Secondary | ICD-10-CM

## 2014-09-27 DIAGNOSIS — Z5181 Encounter for therapeutic drug level monitoring: Secondary | ICD-10-CM

## 2014-09-27 DIAGNOSIS — I480 Paroxysmal atrial fibrillation: Secondary | ICD-10-CM

## 2014-09-27 DIAGNOSIS — I4891 Unspecified atrial fibrillation: Secondary | ICD-10-CM

## 2014-09-27 LAB — POCT INR: INR: 1.8

## 2014-10-02 ENCOUNTER — Other Ambulatory Visit: Payer: Self-pay | Admitting: Cardiovascular Disease

## 2014-10-02 MED ORDER — POTASSIUM CHLORIDE ER 10 MEQ PO TBCR: 20.0000 meq | EXTENDED_RELEASE_TABLET | Freq: Every day | ORAL | Status: DC

## 2014-10-02 NOTE — Telephone Encounter (Signed)
Received fax refill request  Rx # [phone] Medication:  Potassium CL micro 10 meq ER tabs Qty 30 Sig:  Take one tablet by mouth daily Physician:  Purvis Sheffield

## 2014-10-05 ENCOUNTER — Emergency Department (HOSPITAL_COMMUNITY)
Admission: EM | Admit: 2014-10-05 | Discharge: 2014-10-05 | Disposition: A | Discharge status: Home / Self Care | Payer: Medicare Other | Attending: Emergency Medicine | Admitting: Emergency Medicine

## 2014-10-05 ENCOUNTER — Encounter (HOSPITAL_COMMUNITY): Payer: Self-pay | Admitting: Emergency Medicine

## 2014-10-05 ENCOUNTER — Emergency Department (HOSPITAL_COMMUNITY): Payer: Medicare Other

## 2014-10-05 DIAGNOSIS — J159 Unspecified bacterial pneumonia: Secondary | ICD-10-CM | POA: Insufficient documentation

## 2014-10-05 DIAGNOSIS — I129 Hypertensive chronic kidney disease with stage 1 through stage 4 chronic kidney disease, or unspecified chronic kidney disease: Secondary | ICD-10-CM | POA: Diagnosis not present

## 2014-10-05 DIAGNOSIS — N189 Chronic kidney disease, unspecified: Secondary | ICD-10-CM | POA: Diagnosis not present

## 2014-10-05 DIAGNOSIS — I4891 Unspecified atrial fibrillation: Secondary | ICD-10-CM | POA: Insufficient documentation

## 2014-10-05 DIAGNOSIS — Z9889 Other specified postprocedural states: Secondary | ICD-10-CM | POA: Insufficient documentation

## 2014-10-05 DIAGNOSIS — Z79899 Other long term (current) drug therapy: Secondary | ICD-10-CM | POA: Insufficient documentation

## 2014-10-05 DIAGNOSIS — F0390 Unspecified dementia without behavioral disturbance: Secondary | ICD-10-CM | POA: Insufficient documentation

## 2014-10-05 DIAGNOSIS — I2510 Atherosclerotic heart disease of native coronary artery without angina pectoris: Secondary | ICD-10-CM | POA: Diagnosis not present

## 2014-10-05 DIAGNOSIS — Z7901 Long term (current) use of anticoagulants: Secondary | ICD-10-CM | POA: Diagnosis not present

## 2014-10-05 DIAGNOSIS — R2240 Localized swelling, mass and lump, unspecified lower limb: Secondary | ICD-10-CM | POA: Diagnosis present

## 2014-10-05 DIAGNOSIS — J189 Pneumonia, unspecified organism: Secondary | ICD-10-CM

## 2014-10-05 LAB — CBC WITH DIFFERENTIAL/PLATELET
BASOS PCT: 1 % (ref 0–1)
Basophils Absolute: 0.1 10*3/uL (ref 0.0–0.1)
EOS ABS: 0.2 10*3/uL (ref 0.0–0.7)
Eosinophils Relative: 2 % (ref 0–5)
HCT: 39.9 % (ref 36.0–46.0)
Hemoglobin: 12.4 g/dL (ref 12.0–15.0)
LYMPHS ABS: 1.4 10*3/uL (ref 0.7–4.0)
LYMPHS PCT: 20 % (ref 12–46)
MCH: 25.4 pg — ABNORMAL LOW (ref 26.0–34.0)
MCHC: 31.1 g/dL (ref 30.0–36.0)
MCV: 81.6 fL (ref 78.0–100.0)
Monocytes Absolute: 0.7 10*3/uL (ref 0.1–1.0)
Monocytes Relative: 9 % (ref 3–12)
Neutro Abs: 4.6 10*3/uL (ref 1.7–7.7)
Neutrophils Relative %: 68 % (ref 43–77)
PLATELETS: 189 10*3/uL (ref 150–400)
RBC: 4.89 MIL/uL (ref 3.87–5.11)
RDW: 17.1 % — ABNORMAL HIGH (ref 11.5–15.5)
WBC: 6.9 10*3/uL (ref 4.0–10.5)

## 2014-10-05 LAB — BASIC METABOLIC PANEL
Anion gap: 5 (ref 5–15)
BUN: 52 mg/dL — ABNORMAL HIGH (ref 6–23)
CO2: 30 mmol/L (ref 19–32)
CREATININE: 1.58 mg/dL — AB (ref 0.50–1.10)
Calcium: 9.7 mg/dL (ref 8.4–10.5)
Chloride: 105 mmol/L (ref 96–112)
GFR calc Af Amer: 34 mL/min — ABNORMAL LOW (ref >90)
GFR calc non Af Amer: 29 mL/min — ABNORMAL LOW (ref >90)
GLUCOSE: 86 mg/dL (ref 70–99)
Potassium: 4.5 mmol/L (ref 3.5–5.1)
Sodium: 140 mmol/L (ref 135–145)

## 2014-10-05 LAB — PROTIME-INR
INR: 1.96 — ABNORMAL HIGH (ref 0.00–1.49)
Prothrombin Time: 22.5 seconds — ABNORMAL HIGH (ref 11.6–15.2)

## 2014-10-05 LAB — BRAIN NATRIURETIC PEPTIDE: B Natriuretic Peptide: 567 pg/mL — ABNORMAL HIGH (ref 0.0–100.0)

## 2014-10-05 MED ORDER — FUROSEMIDE 10 MG/ML IJ SOLN
80.0000 mg | Freq: Once | INTRAMUSCULAR | Status: AC
  Administered 2014-10-05: 80 mg via INTRAVENOUS
  Filled 2014-10-05: qty 8

## 2014-10-05 MED ORDER — VANCOMYCIN HCL IN DEXTROSE 1-5 GM/200ML-% IV SOLN
1000.0000 mg | Freq: Once | INTRAVENOUS | Status: AC
  Administered 2014-10-05: 1000 mg via INTRAVENOUS
  Filled 2014-10-05: qty 200

## 2014-10-05 MED ORDER — METOLAZONE 2.5 MG PO TABS
2.5000 mg | ORAL_TABLET | Freq: Once | ORAL | Status: AC
  Administered 2014-10-05: 2.5 mg via ORAL
  Filled 2014-10-05: qty 1

## 2014-10-05 MED ORDER — DEXTROSE 5 % IV SOLN
2.0000 g | Freq: Once | INTRAVENOUS | Status: AC
  Administered 2014-10-05: 2 g via INTRAVENOUS
  Filled 2014-10-05: qty 2

## 2014-10-05 MED ORDER — LEVOFLOXACIN 500 MG PO TABS: 500.0000 mg | ORAL_TABLET | Freq: Every day | ORAL | Status: DC

## 2014-10-05 NOTE — ED Notes (Addendum)
Pt husband reports pt has gained 6lb since Monday. Pt husband reports pt recently discharge from hospital with CHF diagnosis. Pt denies chest pain, sob, gu/gi symptoms. +1 BLE edema noted in triage.

## 2014-10-05 NOTE — Discharge Instructions (Signed)
Increase Lasix to 80 mg in the morning and 40 mg in evening. Check your weights daily. Follow for your primary care doctor next week. Start new antibiotic tomorrow evening

## 2014-10-05 NOTE — ED Notes (Signed)
MD at bedside. 

## 2014-10-05 NOTE — Progress Notes (Signed)
Pharmacy Note:  Vancomycin and Cefepime  Initial antibiotics for Vancomycin and Cefepime ordered to be given in ED.    Estimated Creatinine Clearance: 23.3 mL/min (by C-G formula based on Cr of 1.58).   No Known Allergies  Filed Vitals:   10/05/14 1800  BP: 137/78  Pulse:   Temp:   Resp:     Anti-infectives    Start     Dose/Rate Route Frequency Ordered Stop   10/05/14 1915  vancomycin (VANCOCIN) IVPB 1000 mg/200 mL premix     1,000 mg 200 mL/hr over 60 Minutes Intravenous  Once 10/05/14 1816     10/05/14 1830  ceFEPIme (MAXIPIME) 2 g in dextrose 5 % 50 mL IVPB     2 g 100 mL/hr over 30 Minutes Intravenous  Once 10/05/14 1816       Plan: Initial doses of Vancomycin 1000mg  and Cefepime 2000mg   X 1 ordered. F/U admission orders for further dosing if therapy continued.  Wayland Denis, [facility] 10/05/2014 6:16 PM

## 2014-10-05 NOTE — ED Provider Notes (Signed)
CSN: [id number]     Arrival date & time 10/05/14  1402 History   This chart was scribed for Donnetta Hutching, MD by Abel Presto, ED Scribe. This patient was seen in room APA09/APA09 and the patient's care was started at 4:22 PM.    Chief Complaint  Patient presents with  . Leg Swelling     The history is provided by the patient. No language interpreter was used.   HPI Comments: [name] is a 81 y.o. female who presents to the Emergency Department complaining of mild leg swelling. Pt's husband notes pt's home health care called today to notify him that pt has gained 6 lbs in the last 4 days. Pt states she feels fine. Pt is able to ambulate. Pt was admitted to hospital from 09/03/14 with dx CHF. Pt takes 40 mg Lasix b.i.d. Pt denies SOB and chest pain. Pt's PCP is Dr. Sherril Croon. Pt's cardiologist is Dr. Purvis Sheffield. Review of systems positive for slight URI symptoms, i.e. cough. No fever or chills.  Past Medical History  Diagnosis Date  . Sinoatrial node dysfunction      SINUS BRADYCARDIA  . Hypertension     unspecified  . Atrial fibrillation   . Encounter for long-term (current) use of other medications     COUMADIN THERAPY  . Lower extremity weakness   . Sinus bradycardia   . Coronary artery disease   . Bilateral renal artery stenosis     MODERATE by dopplers August 2012.  Marland Kitchen Chronic renal insufficiency   . Dementia    Past Surgical History  Procedure Laterality Date  . Knee arthroscopy    . Cardioversion    . Colonoscopy  05/2011    Dr. Allena Katz: normal. no bx.   Family History  Problem Relation Age of Onset  . Cancer Brother   . Heart failure Mother   . Cirrhosis Other   . High blood pressure Mother    History  Substance Use Topics  . Smoking status: Never Smoker   . Smokeless tobacco: Never Used     Comment: Never smoked  . Alcohol Use: No   OB History    No data available     Review of Systems  Cardiovascular: Positive for leg swelling.  A complete 10 system  review of systems was obtained and all systems are negative except as noted in the HPI and PMH.      Allergies  Review of patient's allergies indicates no known allergies.  Home Medications   Prior to Admission medications   Medication Sig Start Date End Date Taking? Authorizing Provider  ALPRAZolam Prudy Feeler) 0.5 MG tablet Take 0.5 mg by mouth at bedtime as needed for anxiety or sleep.    Yes Historical Provider, MD  amLODipine (NORVASC) 10 MG tablet Take 10 mg by mouth daily. 09/22/14  Yes Historical Provider, MD  Biotin 5000 MCG TABS Take 1 tablet by mouth daily.    Yes Historical Provider, MD  Calcium Carbonate-Vitamin D (CALTRATE 600+D PO) Take 1 tablet by mouth 2 (two) times daily.    Yes Historical Provider, MD  carvedilol (COREG) 6.25 MG tablet Take 0.5 tablets (3.125 mg total) by mouth 2 (two) times daily. Patient taking differently: Take 6.25 mg by mouth 2 (two) times daily.  08/16/13  Yes Laqueta Linden, MD  diltiazem (CARDIZEM) 30 MG tablet Take 1 tablet (30 mg total) by mouth 2 (two) times daily. 05/03/14  Yes Laqueta Linden, MD  donepezil (ARICEPT) 10 MG tablet  Take 1 tablet by mouth at bedtime. 08/06/14  Yes Historical Provider, MD  feeding supplement, ENSURE COMPLETE, (ENSURE COMPLETE) LIQD Take 237 mLs by mouth 3 (three) times daily between meals. 09/07/14  Yes Erick Blinks, MD  furosemide (LASIX) 40 MG tablet Take 1 tablet (40 mg total) by mouth daily. 09/17/14  Yes Laqueta Linden, MD  HYDROcodone-acetaminophen (NORCO/VICODIN) 5-325 MG per tablet Take 1 tablet by mouth every 8 (eight) hours as needed for moderate pain.   Yes Historical Provider, MD  memantine (NAMENDA) 10 MG tablet Take 1 tablet by mouth 2 (two) times daily. 08/09/14  Yes Historical Provider, MD  Multiple Vitamin (MULTIVITAMIN) tablet Take 1 tablet by mouth 2 (two) times daily.    Yes Historical Provider, MD  omeprazole (PRILOSEC) 40 MG capsule Take 1 capsule (40 mg total) by mouth daily. 05/03/14  Yes  Laqueta Linden, MD  potassium chloride (K-DUR) 10 MEQ tablet Take 2 tablets (20 mEq total) by mouth daily. 10/02/14  Yes Laqueta Linden, MD  warfarin (COUMADIN) 2 MG tablet Take 0.5-1 tablets (1-2 mg total) by mouth every evening. Takes one half tablet (  total) on Tuesdays and Thursdays, then takes one tablet (  total) on all other days Patient taking differently: Take 2-4 mg by mouth See admin instructions. Takes ONE tablet (  total) every day except take TWO tablets on Thursdays 08/23/14  Yes Laqueta Linden, MD  albuterol-ipratropium (COMBIVENT) 18-103 MCG/ACT inhaler Inhale 2 puffs into the lungs every 6 (six) hours as needed for wheezing or shortness of breath. 04/19/14   Vassie Loll, MD  HYDROcodone-acetaminophen (NORCO) 7.5-325 MG per tablet Take 1 tablet by mouth every 8 (eight) hours as needed for moderate pain or severe pain.    Historical Provider, MD  levofloxacin (LEVAQUIN) 500 MG tablet Take 1 tablet (500 mg total) by mouth daily. 10/05/14   Donnetta Hutching, MD   BP 137/78 mmHg  Pulse 93  Temp(Src) 97.6 F (36.4 C) (Oral)  Resp 22  Ht  (1.626 m)  Wt 125 lb (56.7 kg)  BMI 21.45 kg/m2  SpO2 94% Physical Exam  Constitutional: She is oriented to person, place, and time. She appears well-developed and well-nourished.  HENT:  Head: Normocephalic and atraumatic.  Eyes: Conjunctivae and EOM are normal. Pupils are equal, round, and reactive to light.  Neck: Normal range of motion. Neck supple.  Cardiovascular: Normal rate and regular rhythm.   Pulmonary/Chest: Effort normal and breath sounds normal.  Abdominal: Soft. Bowel sounds are normal.  Musculoskeletal: Normal range of motion.  Neurological: She is alert and oriented to person, place, and time.  Skin: Skin is warm and dry.  Psychiatric: She has a normal mood and affect. Her behavior is normal.  Nursing note and vitals reviewed.   ED Course  Procedures (including critical care time) DIAGNOSTIC  STUDIES: Oxygen Saturation is 96% on room air, normal by my interpretation.    COORDINATION OF CARE: 4:30 PM Discussed treatment plan with patient at beside, the patient agrees with the plan and has no further questions at this time.   Labs Review Labs Reviewed  CBC WITH DIFFERENTIAL/PLATELET - Abnormal; Notable for the following:    MCH 25.4 (*)    RDW 17.1 (*)    All other components within normal limits  BASIC METABOLIC PANEL - Abnormal; Notable for the following:    BUN 52 (*)    Creatinine, Ser 1.58 (*)    GFR calc non Af Amer 29 (*)  GFR calc Af Amer 34 (*)    All other components within normal limits  BRAIN NATRIURETIC PEPTIDE - Abnormal; Notable for the following:    B Natriuretic Peptide 567.0 (*)    All other components within normal limits  PROTIME-INR - Abnormal; Notable for the following:    Prothrombin Time 22.5 (*)    INR 1.96 (*)    All other components within normal limits    Imaging Review Dg Chest 2 View  10/05/2014   CLINICAL DATA:  Subsequent encounter for lower extremity swelling for several months.  EXAM: CHEST  2 VIEW  COMPARISON:  09/03/2014.  FINDINGS: Two views study shows hyperexpansion compatible with emphysema. Interstitial markings are diffusely coarsened with chronic features. New patchy airspace disease is seen in the medial right lung base, suggesting pneumonia. Left lung base is better aerated than on the previous study. Probable tiny bilateral pleural effusions. Cardiopericardial silhouette is at upper limits of normal for size. Bones are diffusely demineralized. Telemetry leads overlie the chest.  IMPRESSION: Emphysema with underlying chronic interstitial changes.  New right infrahilar opacity. Given the rapid interval appearance, this likely represents atelectasis or pneumonia.   Electronically Signed   By: Kennith Center M.D.   On: 10/05/2014 17:22     EKG Interpretation   Date/Time:  Friday October 05 2014 14:12:53 EST Ventricular Rate:   75 PR Interval:    QRS Duration: 88 QT Interval:  390 QTC Calculation: 435 R Axis:   11 Text Interpretation:  Atrial fibrillation with premature ventricular or  aberrantly conducted complexes Possible Anterior infarct , age  undetermined Abnormal ECG No significant change was found Confirmed by  Manus Gunning  MD, STEPHEN [phone]) on 10/05/2014 2:38:23 PM      Results for orders placed or performed during the hospital encounter of 10/05/14  CBC with Differential  Result Value Ref Range   WBC 6.9 4.0 - 10.5 K/uL   RBC 4.89 3.87 - 5.11 MIL/uL   Hemoglobin 12.4 12.0 - 15.0 g/dL   HCT 60.4 54.0 - 98.1 %   MCV 81.6 78.0 - 100.0 fL   MCH 25.4 (L) 26.0 - 34.0 pg   MCHC 31.1 30.0 - 36.0 g/dL   RDW 19.1 (H) 47.8 - 29.5 %   Platelets 189 150 - 400 K/uL   Neutrophils Relative % 68 43 - 77 %   Neutro Abs 4.6 1.7 - 7.7 K/uL   Lymphocytes Relative 20 12 - 46 %   Lymphs Abs 1.4 0.7 - 4.0 K/uL   Monocytes Relative 9 3 - 12 %   Monocytes Absolute 0.7 0.1 - 1.0 K/uL   Eosinophils Relative 2 0 - 5 %   Eosinophils Absolute 0.2 0.0 - 0.7 K/uL   Basophils Relative 1 0 - 1 %   Basophils Absolute 0.1 0.0 - 0.1 K/uL  Basic metabolic panel  Result Value Ref Range   Sodium 140 135 - 145 mmol/L   Potassium 4.5 3.5 - 5.1 mmol/L   Chloride 105 96 - 112 mmol/L   CO2 30 19 - 32 mmol/L   Glucose, Bld 86 70 - 99 mg/dL   BUN 52 (H) 6 - 23 mg/dL   Creatinine, Ser 6.21 (H) 0.50 - 1.10 mg/dL   Calcium 9.7 8.4 - 30.8 mg/dL   GFR calc non Af Amer 29 (L) >90 mL/min   GFR calc Af Amer 34 (L) >90 mL/min   Anion gap 5 5 - 15  Brain natriuretic peptide  Result Value Ref Range  B Natriuretic Peptide 567.0 (H) 0.0 - 100.0 pg/mL  Protime-INR  Result Value Ref Range   Prothrombin Time 22.5 (H) 11.6 - 15.2 seconds   INR 1.96 (H) 0.00 - 1.49   Dg Chest 2 View  10/05/2014   CLINICAL DATA:  Subsequent encounter for lower extremity swelling for several months.  EXAM: CHEST  2 VIEW  COMPARISON:  09/03/2014.  FINDINGS: Two  views study shows hyperexpansion compatible with emphysema. Interstitial markings are diffusely coarsened with chronic features. New patchy airspace disease is seen in the medial right lung base, suggesting pneumonia. Left lung base is better aerated than on the previous study. Probable tiny bilateral pleural effusions. Cardiopericardial silhouette is at upper limits of normal for size. Bones are diffusely demineralized. Telemetry leads overlie the chest.  IMPRESSION: Emphysema with underlying chronic interstitial changes.  New right infrahilar opacity. Given the rapid interval appearance, this likely represents atelectasis or pneumonia.   Electronically Signed   By: Kennith Center M.D.   On: 10/05/2014 17:22      Date: 10/05/2014  Rate: 75  Rhythm: atrial fib  QRS Axis: normal  Intervals: normal  ST/T Wave abnormalities: normal  Conduction Disutrbances: none  Narrative Interpretation: unremarkable    MDM   Final diagnoses:  Healthcare-associated pneumonia  Discussed clinical scenario with cardiologist in [place]. He recommended Lasix 80 mg IV and Zaroxolyn 2.5 mg orally. Patient is hemodynamically stable. Chest x-ray shows chronic interstitial changes with a new right infrahilar opacity. Suspect pneumonia. IV vancomycin, IV Maxipime. Discharge medications Levaquin 500 mg daily for 10 days.  I personally performed the services described in this documentation, which was scribed in my presence. The recorded information has been reviewed and is accurate.     Donnetta Hutching, MD 10/05/14 2034

## 2014-10-08 ENCOUNTER — Telehealth: Payer: Self-pay | Admitting: Cardiovascular Disease

## 2014-10-08 NOTE — Telephone Encounter (Signed)
Started the following medication this past weekend due to being in the hospital. Was told to contact our office in reference to [facility]  Levofloxacine  500mg  1 tablet every day

## 2014-10-08 NOTE — Telephone Encounter (Signed)
Patient's aid called back. [name] does not remember what she was told to do about her medication. Would you please contact her again

## 2014-10-08 NOTE — Telephone Encounter (Signed)
Called and [facility] for husband with coumadin instructions again.

## 2014-10-08 NOTE — Telephone Encounter (Signed)
Spoke with pt.  Told her to take coumadin 2mg  daily till INR check on 3/3.  Last office INR was 1.8  Hospital INR on 2/26 was 1.96  She verbalized understanding.

## 2014-10-11 ENCOUNTER — Ambulatory Visit (INDEPENDENT_AMBULATORY_CARE_PROVIDER_SITE_OTHER): Payer: Medicare Other | Admitting: *Deleted

## 2014-10-11 DIAGNOSIS — I4891 Unspecified atrial fibrillation: Secondary | ICD-10-CM

## 2014-10-11 DIAGNOSIS — I480 Paroxysmal atrial fibrillation: Secondary | ICD-10-CM

## 2014-10-11 DIAGNOSIS — Z7901 Long term (current) use of anticoagulants: Secondary | ICD-10-CM

## 2014-10-11 DIAGNOSIS — Z5181 Encounter for therapeutic drug level monitoring: Secondary | ICD-10-CM

## 2014-10-11 LAB — POCT INR: INR: 1.8

## 2014-10-12 LAB — BASIC METABOLIC PANEL
BUN: 56 mg/dL — ABNORMAL HIGH (ref 6–23)
CO2: 29 mEq/L (ref 19–32)
Calcium: 10.4 mg/dL (ref 8.4–10.5)
Chloride: 101 mEq/L (ref 96–112)
Creat: 1.98 mg/dL — ABNORMAL HIGH (ref 0.50–1.10)
GLUCOSE: 60 mg/dL — AB (ref 70–99)
Potassium: 4.5 mEq/L (ref 3.5–5.3)
Sodium: 142 mEq/L (ref 135–145)

## 2014-10-15 ENCOUNTER — Telehealth: Payer: Self-pay | Admitting: *Deleted

## 2014-10-15 DIAGNOSIS — N189 Chronic kidney disease, unspecified: Secondary | ICD-10-CM

## 2014-10-15 MED ORDER — FUROSEMIDE 40 MG PO TABS: ORAL_TABLET | ORAL | Status: DC

## 2014-10-15 NOTE — Telephone Encounter (Signed)
-----   Message from Jodelle Gross, NP sent at 10/15/2014  6:54 AM EST ----- Change lasix to 40 mg alternating with 20 mg every other day. Repeat BMET in one week. Should not be on amlodipine and diltiazem both. Only diltiazem

## 2014-10-15 NOTE — Telephone Encounter (Signed)
Patient's husband notified. All questions answered. Husband voiced understanding.

## 2014-10-19 ENCOUNTER — Ambulatory Visit: Payer: Medicare Other | Admitting: Cardiovascular Disease

## 2014-10-23 LAB — BASIC METABOLIC PANEL
BUN: 32 mg/dL — AB (ref 6–23)
CO2: 32 mEq/L (ref 19–32)
CREATININE: 1.3 mg/dL — AB (ref 0.50–1.10)
Calcium: 9.6 mg/dL (ref 8.4–10.5)
Chloride: 102 mEq/L (ref 96–112)
Glucose, Bld: 85 mg/dL (ref 70–99)
Potassium: 4.8 mEq/L (ref 3.5–5.3)
Sodium: 143 mEq/L (ref 135–145)

## 2014-10-25 ENCOUNTER — Ambulatory Visit (INDEPENDENT_AMBULATORY_CARE_PROVIDER_SITE_OTHER): Payer: Medicare Other | Admitting: *Deleted

## 2014-10-25 DIAGNOSIS — I4891 Unspecified atrial fibrillation: Secondary | ICD-10-CM | POA: Diagnosis not present

## 2014-10-25 DIAGNOSIS — Z5181 Encounter for therapeutic drug level monitoring: Secondary | ICD-10-CM | POA: Diagnosis not present

## 2014-10-25 DIAGNOSIS — I480 Paroxysmal atrial fibrillation: Secondary | ICD-10-CM

## 2014-10-25 DIAGNOSIS — Z7901 Long term (current) use of anticoagulants: Secondary | ICD-10-CM

## 2014-10-25 LAB — POCT INR: INR: 1.8

## 2014-11-08 ENCOUNTER — Ambulatory Visit (INDEPENDENT_AMBULATORY_CARE_PROVIDER_SITE_OTHER): Payer: Medicare Other | Admitting: *Deleted

## 2014-11-08 DIAGNOSIS — Z5181 Encounter for therapeutic drug level monitoring: Secondary | ICD-10-CM | POA: Diagnosis not present

## 2014-11-08 DIAGNOSIS — Z7901 Long term (current) use of anticoagulants: Secondary | ICD-10-CM | POA: Diagnosis not present

## 2014-11-08 DIAGNOSIS — I4891 Unspecified atrial fibrillation: Secondary | ICD-10-CM | POA: Diagnosis not present

## 2014-11-08 DIAGNOSIS — I480 Paroxysmal atrial fibrillation: Secondary | ICD-10-CM

## 2014-11-08 LAB — POCT INR: INR: 2.7

## 2014-11-29 ENCOUNTER — Ambulatory Visit (INDEPENDENT_AMBULATORY_CARE_PROVIDER_SITE_OTHER): Payer: Medicare Other | Admitting: Pharmacist

## 2014-11-29 DIAGNOSIS — I480 Paroxysmal atrial fibrillation: Secondary | ICD-10-CM | POA: Diagnosis not present

## 2014-11-29 DIAGNOSIS — I4891 Unspecified atrial fibrillation: Secondary | ICD-10-CM | POA: Diagnosis not present

## 2014-11-29 DIAGNOSIS — Z7901 Long term (current) use of anticoagulants: Secondary | ICD-10-CM | POA: Diagnosis not present

## 2014-11-29 DIAGNOSIS — Z5181 Encounter for therapeutic drug level monitoring: Secondary | ICD-10-CM | POA: Diagnosis not present

## 2014-11-29 LAB — POCT INR: INR: 2.3

## 2014-12-04 ENCOUNTER — Other Ambulatory Visit: Payer: Self-pay | Admitting: *Deleted

## 2014-12-04 MED ORDER — DILTIAZEM HCL 30 MG PO TABS: 30.0000 mg | ORAL_TABLET | Freq: Two times a day (BID) | ORAL | Status: DC

## 2014-12-13 ENCOUNTER — Ambulatory Visit (INDEPENDENT_AMBULATORY_CARE_PROVIDER_SITE_OTHER): Payer: Medicare Other | Admitting: Cardiovascular Disease

## 2014-12-13 ENCOUNTER — Ambulatory Visit (INDEPENDENT_AMBULATORY_CARE_PROVIDER_SITE_OTHER): Payer: Medicare Other | Admitting: *Deleted

## 2014-12-13 ENCOUNTER — Encounter: Payer: Self-pay | Admitting: Cardiovascular Disease

## 2014-12-13 VITALS — BP 128/84 | HR 115 | Ht 63.0 in | Wt 118.0 lb

## 2014-12-13 DIAGNOSIS — I5032 Chronic diastolic (congestive) heart failure: Secondary | ICD-10-CM | POA: Diagnosis not present

## 2014-12-13 DIAGNOSIS — I701 Atherosclerosis of renal artery: Secondary | ICD-10-CM | POA: Diagnosis not present

## 2014-12-13 DIAGNOSIS — R609 Edema, unspecified: Secondary | ICD-10-CM

## 2014-12-13 DIAGNOSIS — I4891 Unspecified atrial fibrillation: Secondary | ICD-10-CM

## 2014-12-13 DIAGNOSIS — R5383 Other fatigue: Secondary | ICD-10-CM | POA: Diagnosis not present

## 2014-12-13 DIAGNOSIS — N189 Chronic kidney disease, unspecified: Secondary | ICD-10-CM

## 2014-12-13 DIAGNOSIS — Z87898 Personal history of other specified conditions: Secondary | ICD-10-CM

## 2014-12-13 DIAGNOSIS — Z7901 Long term (current) use of anticoagulants: Secondary | ICD-10-CM | POA: Diagnosis not present

## 2014-12-13 DIAGNOSIS — I10 Essential (primary) hypertension: Secondary | ICD-10-CM

## 2014-12-13 DIAGNOSIS — Z5181 Encounter for therapeutic drug level monitoring: Secondary | ICD-10-CM

## 2014-12-13 DIAGNOSIS — Z9289 Personal history of other medical treatment: Secondary | ICD-10-CM

## 2014-12-13 LAB — POCT INR: INR: 4

## 2014-12-13 MED ORDER — DILTIAZEM HCL 60 MG PO TABS: 60.0000 mg | ORAL_TABLET | Freq: Two times a day (BID) | ORAL | Status: DC

## 2014-12-13 NOTE — Patient Instructions (Signed)
   Increase Diltiazem to 60mg  twice a day  - new sent to pharmacy today. Continue all other medications.   Lab for [facility] will contact with results via phone or letter.   Follow up in  2 weeks

## 2014-12-13 NOTE — Progress Notes (Signed)
Patient ID: [name], female   DOB: [birth year], 81 y.o.   MRN: [id number]      SUBJECTIVE: The patient presents for follow-up of atrial fibrillation, chronic diastolic heart failure, and hypertension in the context of chronic kidney disease. She was hospitalized for acute on chronic diastolic heart failure in January. She saw her nephrologist on 4/18. Her kidney function was found to be stable with a GFR ranging between 31-35 mL/min. BUN 44, creatinine 1.54 on 4/12.  She has been feeling weak and tired for the past one to two months. She has some mild exertional dyspnea. Her appetite has been poor.   ECG performed in the office today demonstrates rapid atrial fibrillation, heart rate 122 bpm, with a diffuse nonspecific T wave abnormality.   Review of Systems: As per "subjective", otherwise negative.  No Known Allergies  Current Outpatient Prescriptions  Medication Sig Dispense Refill  . albuterol-ipratropium (COMBIVENT) 18-103 MCG/ACT inhaler Inhale 2 puffs into the lungs every 6 (six) hours as needed for wheezing or shortness of breath. 1 Inhaler 2  . ALPRAZolam (XANAX) 0.5 MG tablet Take 0.5 mg by mouth at bedtime as needed for anxiety or sleep.     . Biotin 5000 MCG TABS Take 1 tablet by mouth daily.     . Calcium Carbonate-Vitamin D (CALTRATE 600+D PO) Take 1 tablet by mouth 2 (two) times daily.     . carvedilol (COREG) 6.25 MG tablet Take 0.5 tablets (3.125 mg total) by mouth 2 (two) times daily. (Patient taking differently: Take 6.25 mg by mouth 2 (two) times daily. )    . diltiazem (CARDIZEM) 30 MG tablet Take 1 tablet (30 mg total) by mouth 2 (two) times daily. 60 tablet 3  . donepezil (ARICEPT) 10 MG tablet Take 1 tablet by mouth at bedtime.  6  . feeding supplement, ENSURE COMPLETE, (ENSURE COMPLETE) LIQD Take 237 mLs by mouth 3 (three) times daily between meals. 90 Bottle 1  . furosemide (LASIX) 40 MG tablet Change Lasix to 40 mg alternating with 20 mg every other day  30 tablet 1  . HYDROcodone-acetaminophen (NORCO) 7.5-325 MG per tablet Take 1 tablet by mouth every 8 (eight) hours as needed for moderate pain or severe pain.    Marland Kitchen HYDROcodone-acetaminophen (NORCO/VICODIN) 5-325 MG per tablet Take 1 tablet by mouth every 8 (eight) hours as needed for moderate pain.    . memantine (NAMENDA) 10 MG tablet Take 1 tablet by mouth 2 (two) times daily.  3  . Multiple Vitamin (MULTIVITAMIN) tablet Take 1 tablet by mouth 2 (two) times daily.     Marland Kitchen omeprazole (PRILOSEC) 40 MG capsule Take 1 capsule (40 mg total) by mouth daily. 30 capsule 3  . potassium chloride (K-DUR) 10 MEQ tablet Take 2 tablets (20 mEq total) by mouth daily. 60 tablet 6  . warfarin (COUMADIN) 2 MG tablet Take 0.5-1 tablets (1-2 mg total) by mouth every evening. Takes one half tablet (  total) on Tuesdays and Thursdays, then takes one tablet (  total) on all other days (Patient taking differently: Take 2-4 mg by mouth See admin instructions. Takes ONE tablet (  total) every day except take TWO tablets on Thursdays) 90 tablet 2   No current facility-administered medications for this visit.    Past Medical History  Diagnosis Date  . Sinoatrial node dysfunction      SINUS BRADYCARDIA  . Hypertension     unspecified  . Atrial fibrillation   . Encounter for long-term (current) use of other  medications     COUMADIN THERAPY  . Lower extremity weakness   . Sinus bradycardia   . Coronary artery disease   . Bilateral renal artery stenosis     MODERATE by dopplers August 2012.  Marland Kitchen Chronic renal insufficiency   . Dementia     Past Surgical History  Procedure Laterality Date  . Knee arthroscopy    . Cardioversion    . Colonoscopy  05/2011    Dr. Allena Katz: normal. no bx.    History   Social History  . Marital Status: Married    Spouse Name: [name]  . Number of Children: 0  . Years of Education: 12   Occupational History  . RETIRED   .     Social History Main Topics  . Smoking status:  Never Smoker   . Smokeless tobacco: Never Used     Comment: Never smoked  . Alcohol Use: No  . Drug Use: No  . Sexual Activity: Not Currently   Other Topics Concern  . Not on file   Social History Narrative   Patient lives at home with her husband [name]) Patient is retired. Patient has 12 th grade education.   Right handed.   Caffeine- two three cups of coffee daily.     Filed Vitals:   12/13/14 1258  BP: 128/84  Pulse: 115  Height: 5\' 3"  (1.6 m)  Weight: 118 lb (53.524 kg)  SpO2: 95%    PHYSICAL EXAM General: NAD  Neck: No JVD, no thyromegaly.  Lungs: Clear to auscultation bilaterally with normal respiratory effort.  CV: Tachycardic, irregular rhythm, normal S1/S2, no S3, II/VI pansystolic murmur along left sternal border. No pedal edema. Abdomen: Soft, nontender, no hepatosplenomegaly, no distention.  Neurologic: Alert and oriented.  Psych: Normal affect.  Skin: Normal. Musculoskeletal: No gross deformities. Extremities: No clubbing or cyanosis.   ECG: Most recent ECG reviewed.      ASSESSMENT AND PLAN:  Essential HTN Normal today. She has unilateral renal artery stenosis. Previously d/c prazosin. Will closely monitor given her propensity for high BP.   Peripheral edema  She has had exacerbations of chronic kidney disease in the past, and has right renal artery stenosis. Weight is stable at 118 lbs. On Lasix 40 mg and 20 mg on alternate days. BMET results as noted above. No changes.  Chronic renal insufficiency/Right renal artery stenosis  Repeat renal Dopplers in 1 year. BMET results as noted above.  Will repeat BMET.   Atrial fibrillation  Tachycardic and feeling weak. Will increase diltiazem to 60 mg bid and continue Coreg at present dose. On Coumadin for anticoagulation, followed here in our clinic. INR supratherapeutic at 4 today. Given abnormal TFT's, I have since discontinued amiodarone.   Chronic diastolic heart failure  Weight is  down to 118 lbs. On Lasix 40 mg daily. BMET results above. Will repeat BMET.  Gait instability  I previously made a referral to neurology, and she is now being managed by Dr. Terrace Arabia.   Type 2 diabetes On glimepiride. HbA1c 7.1% on 04/17/2014.  Dispo: f/u 2 weeks.   Prentice Docker, M.D., F.A.C.C.

## 2014-12-20 ENCOUNTER — Encounter: Payer: Self-pay | Admitting: *Deleted

## 2014-12-27 ENCOUNTER — Encounter: Payer: Self-pay | Admitting: Cardiovascular Disease

## 2014-12-27 ENCOUNTER — Ambulatory Visit (INDEPENDENT_AMBULATORY_CARE_PROVIDER_SITE_OTHER): Payer: Medicare Other | Admitting: Cardiovascular Disease

## 2014-12-27 ENCOUNTER — Ambulatory Visit (INDEPENDENT_AMBULATORY_CARE_PROVIDER_SITE_OTHER): Payer: Medicare Other | Admitting: *Deleted

## 2014-12-27 VITALS — BP 120/70 | HR 74 | Ht 64.0 in | Wt 116.0 lb

## 2014-12-27 DIAGNOSIS — I482 Chronic atrial fibrillation, unspecified: Secondary | ICD-10-CM

## 2014-12-27 DIAGNOSIS — Z7901 Long term (current) use of anticoagulants: Secondary | ICD-10-CM | POA: Diagnosis not present

## 2014-12-27 DIAGNOSIS — I4891 Unspecified atrial fibrillation: Secondary | ICD-10-CM | POA: Diagnosis not present

## 2014-12-27 DIAGNOSIS — I701 Atherosclerosis of renal artery: Secondary | ICD-10-CM | POA: Diagnosis not present

## 2014-12-27 DIAGNOSIS — R609 Edema, unspecified: Secondary | ICD-10-CM

## 2014-12-27 DIAGNOSIS — N184 Chronic kidney disease, stage 4 (severe): Secondary | ICD-10-CM | POA: Diagnosis not present

## 2014-12-27 DIAGNOSIS — Z5181 Encounter for therapeutic drug level monitoring: Secondary | ICD-10-CM

## 2014-12-27 DIAGNOSIS — I10 Essential (primary) hypertension: Secondary | ICD-10-CM

## 2014-12-27 DIAGNOSIS — I5032 Chronic diastolic (congestive) heart failure: Secondary | ICD-10-CM

## 2014-12-27 LAB — POCT INR: INR: 2.3

## 2014-12-27 MED ORDER — FUROSEMIDE 20 MG PO TABS: 20.0000 mg | ORAL_TABLET | Freq: Every day | ORAL | Status: DC

## 2014-12-27 NOTE — Patient Instructions (Signed)
   Continue Lasix at 20mg  daily - may take an extra 20mg  tab as needed Continue all other medications.   Follow up in  4 months

## 2014-12-27 NOTE — Progress Notes (Signed)
Patient ID: [name], female   DOB: [birth year], 81 y.o.   MRN: [id number]      SUBJECTIVE: The patient presents for follow-up of atrial fibrillation, chronic diastolic heart failure, and hypertension in the context of chronic kidney disease.  At her last visit with me 2 weeks ago, she had rapid atrial fibrillation, heart rate 122 bpm, for which I increased her diltiazem to 60 mg twice daily.  BUN 39, creatinine 1.07 on 12/25/14.  She is now feeling much better and denies palpitations, shortness of breath, and weakness. She also denies leg swelling. She is taking Lasix 20 mg daily as instructed by her PCP.   Review of Systems: As per "subjective", otherwise negative.  No Known Allergies  Current Outpatient Prescriptions  Medication Sig Dispense Refill  . ALPRAZolam (XANAX) 0.5 MG tablet Take 0.5 mg by mouth at bedtime as needed for anxiety or sleep.     . Biotin 5000 MCG TABS Take 1 tablet by mouth daily.     . Calcium Carbonate-Vitamin D (CALTRATE 600+D PO) Take 1 tablet by mouth 2 (two) times daily.     . carvedilol (COREG) 6.25 MG tablet Take 0.5 tablets (3.125 mg total) by mouth 2 (two) times daily. (Patient taking differently: Take 6.25 mg by mouth 2 (two) times daily. )    . diltiazem (CARDIZEM) 60 MG tablet Take 1 tablet (60 mg total) by mouth 2 (two) times daily. 60 tablet 6  . donepezil (ARICEPT) 10 MG tablet Take 1 tablet by mouth at bedtime.  6  . feeding supplement, ENSURE COMPLETE, (ENSURE COMPLETE) LIQD Take 237 mLs by mouth 3 (three) times daily between meals. 90 Bottle 1  . furosemide (LASIX) 40 MG tablet Change Lasix to 40 mg alternating with 20 mg every other day 30 tablet 1  . HYDROcodone-acetaminophen (NORCO/VICODIN) 5-325 MG per tablet Take 1 tablet by mouth every 8 (eight) hours as needed for moderate pain.    . memantine (NAMENDA) 10 MG tablet Take 1 tablet by mouth 2 (two) times daily.  3  . Multiple Vitamin (MULTIVITAMIN) tablet Take 1 tablet by mouth 2  (two) times daily.     Marland Kitchen omeprazole (PRILOSEC) 40 MG capsule Take 1 capsule (40 mg total) by mouth daily. 30 capsule 3  . potassium chloride (K-DUR) 10 MEQ tablet Take 2 tablets (20 mEq total) by mouth daily. 60 tablet 6  . warfarin (COUMADIN) 2 MG tablet Take 0.5-1 tablets (1-2 mg total) by mouth every evening. Takes one half tablet (1mg  total) on Tuesdays and Thursdays, then takes one tablet (2mg  total) on all other days (Patient taking differently: Take 2-4 mg by mouth See admin instructions. Takes ONE tablet (2mg  total) every day except take TWO tablets on Thursdays) 90 tablet 2   No current facility-administered medications for this visit.    Past Medical History  Diagnosis Date  . Sinoatrial node dysfunction      SINUS BRADYCARDIA  . Hypertension     unspecified  . Atrial fibrillation   . Encounter for long-term (current) use of other medications     COUMADIN THERAPY  . Lower extremity weakness   . Sinus bradycardia   . Coronary artery disease   . Bilateral renal artery stenosis     MODERATE by dopplers August 2012.  Marland Kitchen Chronic renal insufficiency   . Dementia     Past Surgical History  Procedure Laterality Date  . Knee arthroscopy    . Cardioversion    . Colonoscopy  05/2011    Dr. Allena Katz: normal. no bx.    History   Social History  . Marital Status: Married    Spouse Name: [name]  . Number of Children: 0  . Years of Education: 12   Occupational History  . RETIRED   .     Social History Main Topics  . Smoking status: Never Smoker   . Smokeless tobacco: Never Used     Comment: Never smoked  . Alcohol Use: No  . Drug Use: No  . Sexual Activity: Not Currently   Other Topics Concern  . Not on file   Social History Narrative   Patient lives at home with her husband [name]) Patient is retired. Patient has 12 th grade education.   Right handed.   Caffeine- two three cups of coffee daily.     Filed Vitals:   12/27/14 1308  BP: 120/70  Pulse: 74    Height:  (1.626 m)  Weight: 116 lb (52.617 kg)  SpO2: 97%    PHYSICAL EXAM General: NAD  Neck: No JVD, no thyromegaly.  Lungs: Clear to auscultation bilaterally with normal respiratory effort.  CV: Normal rate, irregular rhythm, normal S1/S2, no S3, II/VI pansystolic murmur along left sternal border. No pedal edema. Abdomen: Soft, nontender, no hepatosplenomegaly, no distention.  Neurologic: Alert and oriented.  Psych: Normal affect.  Skin: Normal. Musculoskeletal: No gross deformities. Extremities: No clubbing or cyanosis.   ECG: Most recent ECG reviewed.      ASSESSMENT AND PLAN: Essential HTN Normal today. She has unilateral renal artery stenosis. Previously d/c prazosin. Will closely monitor given her propensity for high BP.   Peripheral edema  She has had exacerbations of chronic kidney disease in the past, and has right renal artery stenosis. Weight is stable at 116 lbs. Instructed to take Lasix 20 mg daily and can take an extra 20 mg for increase in leg swelling/weight. BMET results as noted above. No changes.  Chronic renal insufficiency/Right renal artery stenosis  Repeat renal Dopplers in 1 year. BMET results as noted above.   Atrial fibrillation  HR controlled and symptomatically stable with increase of diltiazem to 60 mg bid. Continue Coreg at present dose. On Coumadin for anticoagulation, followed here in our clinic.  Chronic diastolic heart failure  Weight is down to 116 lbs. Instructed to take Lasix 20 mg daily and can take an extra 20 mg for increase in leg swelling/weight. BMET results above.   Gait instability  I previously made a referral to neurology, and she is now being managed by Dr. Terrace Arabia.   Type 2 diabetes On glimepiride. HbA1c 7.1% on 04/17/2014.  Dispo: f/u 4 months.   Prentice Docker, M.D., F.A.C.C.

## 2015-01-04 ENCOUNTER — Telehealth: Payer: Self-pay | Admitting: Cardiovascular Disease

## 2015-01-04 NOTE — Telephone Encounter (Signed)
Discussed with husband [name]).  Weakness x last few weeks. Feels about the same as last OV.  Informed that normal HR is between 60-100.  Stated she is not really eating a lot, does drink her boost pretty good.  Advised that he may try to get her to drink some gatorade (G2) to give her some extra electrolytes.  Also, keep log of BP readings & heart rate till first of next week and call office back with update.  Husband verbalized understanding.

## 2015-01-16 ENCOUNTER — Telehealth: Payer: Self-pay | Admitting: Cardiovascular Disease

## 2015-01-16 NOTE — Telephone Encounter (Signed)
Sob with pulse rate of 142 and above.  Caregiver would like instructions on what they need to do

## 2015-01-16 NOTE — Telephone Encounter (Signed)
Discussed below with Dr. Purvis Sheffield - suggested OV with Joni Reining, NP next week & ED before then if symptoms worsen. Notified Carollee Herter (daughter) of above.  Appointment given for Tuesday, 01/22/2015 at 2:30 in the [place] office.  She verbalized understanding.

## 2015-01-16 NOTE — Telephone Encounter (Signed)
Discussed below with caregiver ([employer]) & husband [name]).  Husband states that her SOB has increased since last OV & continued weakness.  No other c/o dizziness, chest pain, edema, or weight gain.  HR with pulse ox while on phone was 80's - 150's / sat 95%.  Stated they did see PMD (Vyas) earlier today & vitals were okay there.  Questioned what PMD thought about her & stated that he didn't mention it & that is why they were calling us.

## 2015-01-17 ENCOUNTER — Emergency Department (HOSPITAL_COMMUNITY): Payer: Medicare Other

## 2015-01-17 ENCOUNTER — Inpatient Hospital Stay (HOSPITAL_COMMUNITY)
Admission: EM | Admit: 2015-01-17 | Discharge: 2015-01-19 | DRG: 308 | Disposition: A | Discharge status: Home / Self Care | Payer: Medicare Other | Attending: Family Medicine | Admitting: Family Medicine

## 2015-01-17 ENCOUNTER — Encounter (HOSPITAL_COMMUNITY): Payer: Self-pay | Admitting: *Deleted

## 2015-01-17 DIAGNOSIS — N179 Acute kidney failure, unspecified: Secondary | ICD-10-CM | POA: Diagnosis present

## 2015-01-17 DIAGNOSIS — N183 Chronic kidney disease, stage 3 (moderate): Secondary | ICD-10-CM

## 2015-01-17 DIAGNOSIS — R002 Palpitations: Secondary | ICD-10-CM | POA: Diagnosis not present

## 2015-01-17 DIAGNOSIS — I509 Heart failure, unspecified: Secondary | ICD-10-CM

## 2015-01-17 DIAGNOSIS — E860 Dehydration: Secondary | ICD-10-CM | POA: Diagnosis present

## 2015-01-17 DIAGNOSIS — E875 Hyperkalemia: Secondary | ICD-10-CM | POA: Diagnosis not present

## 2015-01-17 DIAGNOSIS — I4891 Unspecified atrial fibrillation: Secondary | ICD-10-CM | POA: Diagnosis present

## 2015-01-17 DIAGNOSIS — R7401 Elevation of levels of liver transaminase levels: Secondary | ICD-10-CM | POA: Diagnosis present

## 2015-01-17 DIAGNOSIS — I2510 Atherosclerotic heart disease of native coronary artery without angina pectoris: Secondary | ICD-10-CM | POA: Diagnosis present

## 2015-01-17 DIAGNOSIS — R627 Adult failure to thrive: Secondary | ICD-10-CM | POA: Diagnosis present

## 2015-01-17 DIAGNOSIS — Z79899 Other long term (current) drug therapy: Secondary | ICD-10-CM | POA: Diagnosis not present

## 2015-01-17 DIAGNOSIS — I5033 Acute on chronic diastolic (congestive) heart failure: Secondary | ICD-10-CM | POA: Diagnosis present

## 2015-01-17 DIAGNOSIS — I129 Hypertensive chronic kidney disease with stage 1 through stage 4 chronic kidney disease, or unspecified chronic kidney disease: Secondary | ICD-10-CM | POA: Diagnosis present

## 2015-01-17 DIAGNOSIS — R740 Nonspecific elevation of levels of transaminase and lactic acid dehydrogenase [LDH]: Secondary | ICD-10-CM

## 2015-01-17 DIAGNOSIS — F0390 Unspecified dementia without behavioral disturbance: Secondary | ICD-10-CM | POA: Diagnosis present

## 2015-01-17 DIAGNOSIS — E119 Type 2 diabetes mellitus without complications: Secondary | ICD-10-CM | POA: Diagnosis present

## 2015-01-17 DIAGNOSIS — I5032 Chronic diastolic (congestive) heart failure: Secondary | ICD-10-CM | POA: Diagnosis not present

## 2015-01-17 DIAGNOSIS — I10 Essential (primary) hypertension: Secondary | ICD-10-CM | POA: Diagnosis present

## 2015-01-17 DIAGNOSIS — I482 Chronic atrial fibrillation: Secondary | ICD-10-CM

## 2015-01-17 DIAGNOSIS — Z7901 Long term (current) use of anticoagulants: Secondary | ICD-10-CM | POA: Diagnosis not present

## 2015-01-17 DIAGNOSIS — Z8249 Family history of ischemic heart disease and other diseases of the circulatory system: Secondary | ICD-10-CM | POA: Diagnosis not present

## 2015-01-17 LAB — CBC WITH DIFFERENTIAL/PLATELET
BASOS PCT: 1 % (ref 0–1)
Basophils Absolute: 0.1 10*3/uL (ref 0.0–0.1)
Eosinophils Absolute: 0.2 10*3/uL (ref 0.0–0.7)
Eosinophils Relative: 2 % (ref 0–5)
HEMATOCRIT: 46.5 % — AB (ref 36.0–46.0)
Hemoglobin: 14.4 g/dL (ref 12.0–15.0)
LYMPHS PCT: 14 % (ref 12–46)
Lymphs Abs: 1.5 10*3/uL (ref 0.7–4.0)
MCH: 25.4 pg — AB (ref 26.0–34.0)
MCHC: 31 g/dL (ref 30.0–36.0)
MCV: 82.2 fL (ref 78.0–100.0)
MONO ABS: 0.9 10*3/uL (ref 0.1–1.0)
Monocytes Relative: 9 % (ref 3–12)
NEUTROS PCT: 75 % (ref 43–77)
Neutro Abs: 7.9 10*3/uL — ABNORMAL HIGH (ref 1.7–7.7)
Platelets: 216 10*3/uL (ref 150–400)
RBC: 5.66 MIL/uL — ABNORMAL HIGH (ref 3.87–5.11)
RDW: 19.2 % — ABNORMAL HIGH (ref 11.5–15.5)
WBC: 10.5 10*3/uL (ref 4.0–10.5)

## 2015-01-17 LAB — URINALYSIS, ROUTINE W REFLEX MICROSCOPIC
BILIRUBIN URINE: NEGATIVE
Glucose, UA: NEGATIVE mg/dL
Hgb urine dipstick: NEGATIVE
Ketones, ur: NEGATIVE mg/dL
Leukocytes, UA: NEGATIVE
Nitrite: NEGATIVE
Protein, ur: NEGATIVE mg/dL
SPECIFIC GRAVITY, URINE: 1.015 (ref 1.005–1.030)
Urobilinogen, UA: 0.2 mg/dL (ref 0.0–1.0)
pH: 5 (ref 5.0–8.0)

## 2015-01-17 LAB — PROTIME-INR
INR: 2.37 — AB (ref 0.00–1.49)
Prothrombin Time: 25.7 seconds — ABNORMAL HIGH (ref 11.6–15.2)

## 2015-01-17 LAB — COMPREHENSIVE METABOLIC PANEL
ALK PHOS: 145 U/L — AB (ref 38–126)
ALT: 80 U/L — ABNORMAL HIGH (ref 14–54)
ANION GAP: 14 (ref 5–15)
AST: 97 U/L — AB (ref 15–41)
Albumin: 4 g/dL (ref 3.5–5.0)
BILIRUBIN TOTAL: 1.5 mg/dL — AB (ref 0.3–1.2)
BUN: 57 mg/dL — AB (ref 6–20)
CALCIUM: 9.8 mg/dL (ref 8.9–10.3)
CO2: 24 mmol/L (ref 22–32)
Chloride: 104 mmol/L (ref 101–111)
Creatinine, Ser: 1.63 mg/dL — ABNORMAL HIGH (ref 0.44–1.00)
GFR calc Af Amer: 33 mL/min — ABNORMAL LOW (ref >60)
GFR calc non Af Amer: 28 mL/min — ABNORMAL LOW (ref >60)
GLUCOSE: 125 mg/dL — AB (ref 65–99)
POTASSIUM: 5.6 mmol/L — AB (ref 3.5–5.1)
SODIUM: 142 mmol/L (ref 135–145)
TOTAL PROTEIN: 7.1 g/dL (ref 6.5–8.1)

## 2015-01-17 LAB — MRSA PCR SCREENING: MRSA BY PCR: NEGATIVE

## 2015-01-17 LAB — TROPONIN I: TROPONIN I: 0.03 ng/mL (ref <0.031)

## 2015-01-17 LAB — GLUCOSE, CAPILLARY: Glucose-Capillary: 176 mg/dL — ABNORMAL HIGH (ref 65–99)

## 2015-01-17 MED ORDER — SODIUM CHLORIDE 0.9 % IJ SOLN
3.0000 mL | Freq: Two times a day (BID) | INTRAMUSCULAR | Status: DC
  Administered 2015-01-18 – 2015-01-19 (×2): 3 mL via INTRAVENOUS

## 2015-01-17 MED ORDER — INSULIN ASPART 100 UNIT/ML ~~LOC~~ SOLN
0.0000 [IU] | Freq: Three times a day (TID) | SUBCUTANEOUS | Status: DC
  Administered 2015-01-18: 1 [IU] via SUBCUTANEOUS
  Administered 2015-01-19: 2 [IU] via SUBCUTANEOUS

## 2015-01-17 MED ORDER — DONEPEZIL HCL 5 MG PO TABS
10.0000 mg | ORAL_TABLET | Freq: Every day | ORAL | Status: DC
  Administered 2015-01-17 – 2015-01-18 (×2): 10 mg via ORAL
  Filled 2015-01-17 (×2): qty 2

## 2015-01-17 MED ORDER — SODIUM CHLORIDE 0.9 % IV SOLN
INTRAVENOUS | Status: DC
  Administered 2015-01-17: 18:00:00 via INTRAVENOUS
  Administered 2015-01-19: 50 mL/h via INTRAVENOUS

## 2015-01-17 MED ORDER — DILTIAZEM HCL 60 MG PO TABS
90.0000 mg | ORAL_TABLET | Freq: Two times a day (BID) | ORAL | Status: DC
  Administered 2015-01-17 – 2015-01-19 (×4): 90 mg via ORAL
  Filled 2015-01-17 (×8): qty 1

## 2015-01-17 MED ORDER — WARFARIN - PHARMACIST DOSING INPATIENT
Freq: Every day | Status: DC
  Administered 2015-01-18: 18:00:00

## 2015-01-17 MED ORDER — ONDANSETRON HCL 4 MG PO TABS: 4.0000 mg | ORAL_TABLET | Freq: Four times a day (QID) | ORAL | Status: DC | PRN

## 2015-01-17 MED ORDER — FUROSEMIDE 20 MG PO TABS
20.0000 mg | ORAL_TABLET | Freq: Every day | ORAL | Status: DC
  Administered 2015-01-18 – 2015-01-19 (×2): 20 mg via ORAL
  Filled 2015-01-17 (×2): qty 1

## 2015-01-17 MED ORDER — ONDANSETRON HCL 4 MG/2ML IJ SOLN: 4.0000 mg | Freq: Four times a day (QID) | INTRAMUSCULAR | Status: DC | PRN

## 2015-01-17 MED ORDER — WARFARIN SODIUM 1 MG PO TABS
1.0000 mg | ORAL_TABLET | Freq: Once | ORAL | Status: AC
  Administered 2015-01-17: 1 mg via ORAL
  Filled 2015-01-17: qty 1

## 2015-01-17 MED ORDER — DILTIAZEM HCL 100 MG IV SOLR
5.0000 mg/h | INTRAVENOUS | Status: DC
  Administered 2015-01-17: 5 mg/h via INTRAVENOUS
  Filled 2015-01-17: qty 100

## 2015-01-17 MED ORDER — MEMANTINE HCL 10 MG PO TABS
10.0000 mg | ORAL_TABLET | Freq: Two times a day (BID) | ORAL | Status: DC
  Administered 2015-01-17 – 2015-01-19 (×4): 10 mg via ORAL
  Filled 2015-01-17 (×4): qty 1

## 2015-01-17 MED ORDER — CETYLPYRIDINIUM CHLORIDE 0.05 % MT LIQD: 7.0000 mL | Freq: Two times a day (BID) | OROMUCOSAL | Status: DC

## 2015-01-17 MED ORDER — ALPRAZOLAM 0.5 MG PO TABS
0.5000 mg | ORAL_TABLET | Freq: Every evening | ORAL | Status: DC | PRN
  Administered 2015-01-18: 0.5 mg via ORAL
  Filled 2015-01-17 (×2): qty 1

## 2015-01-17 MED ORDER — ACETAMINOPHEN 650 MG RE SUPP
650.0000 mg | Freq: Four times a day (QID) | RECTAL | Status: DC | PRN
Start: 2015-01-17 — End: 2015-01-19

## 2015-01-17 MED ORDER — FUROSEMIDE 10 MG/ML IJ SOLN
20.0000 mg | Freq: Once | INTRAMUSCULAR | Status: AC
  Administered 2015-01-17: 20 mg via INTRAVENOUS
  Filled 2015-01-17: qty 2

## 2015-01-17 MED ORDER — ENSURE ENLIVE PO LIQD
237.0000 mL | Freq: Three times a day (TID) | ORAL | Status: DC
  Administered 2015-01-17 – 2015-01-19 (×6): 237 mL via ORAL

## 2015-01-17 MED ORDER — CARVEDILOL 3.125 MG PO TABS
6.2500 mg | ORAL_TABLET | Freq: Two times a day (BID) | ORAL | Status: DC
  Administered 2015-01-17 – 2015-01-19 (×4): 6.25 mg via ORAL
  Filled 2015-01-17 (×4): qty 2

## 2015-01-17 MED ORDER — ACETAMINOPHEN 325 MG PO TABS: 650.0000 mg | ORAL_TABLET | Freq: Four times a day (QID) | ORAL | Status: DC | PRN

## 2015-01-17 NOTE — ED Notes (Signed)
MD at bedside. 

## 2015-01-17 NOTE — H&P (Signed)
History and Physical  [name] ZOX:[id number] DOB: September 10, 1928 DOA: 01/17/2015  Referring physician: Dr. Adriana Simas in ED PCP: Ignatius Specking., MD  Dr. Purvis Sheffield  Chief Complaint: Weak  HPI:  81 year old woman with history of atrial fibrillation, presented to the emergency department with slow, progressive generalized weakness, failure to thrive with weight loss, poor appetite, shortness of breath. Initial evaluation revealed atrial fibrillation with rapid ventricular response, acute heart failure, acute on chronic renal failure, dehydration. She was started on diltiazem and referred for admission.  History obtained from patient and husband at bedside. Patient's been declining for some time with progressive weight loss secondary to poor appetite and little oral intake, increased ability to the point now where she usually uses a wheelchair to get around. She has had problems with her heart rate and was last seen by her cardiologist one month ago which time her Cardizem was increased. No acute issues noted in the last 24 this by her husband, brought to the hospital today for worsening weakness and failure to thrive. No swelling.  In the emergency department tachycardic, vitals otherwise stable with no hypoxia. Potassium 5.6. BUN elevated 57, creatinine possibly above baseline, 1.63. Alkaline phosphatase, total bilirubin, AST and, ALT mildly elevated. Troponin within normal limits. CBC and urinalysis were unremarkable. Chest x-ray with atelectasis right base, CHF. EKG and apparently reviewed, atrial fibrillation with rapid ventricular response. No acute changes.  Treated with diltiazem infusion and Lasix.  Review of Systems:  Negative for fever, visual changes, sore throat, rash, new muscle aches, chest pain, dysuria, bleeding, n/v/abdominal pain.  Past Medical History  Diagnosis Date  . Sinoatrial node dysfunction      SINUS BRADYCARDIA  . Hypertension     unspecified  . Atrial fibrillation    . Encounter for long-term (current) use of other medications     COUMADIN THERAPY  . Lower extremity weakness   . Sinus bradycardia   . Coronary artery disease   . Dementia   . Bilateral renal artery stenosis     MODERATE by dopplers August 2012.  Marland Kitchen Chronic renal insufficiency     Past Surgical History  Procedure Laterality Date  . Knee arthroscopy    . Cardioversion    . Colonoscopy  05/2011    Dr. Allena Katz: normal. no bx.  . Cholecystectomy    . Back surgery      Social History:  reports that she has never smoked. She has never used smokeless tobacco. She reports that she does not drink alcohol or use illicit drugs. lives with their spouse Partial assistance  No Known Allergies  Family History  Problem Relation Age of Onset  . Cancer Brother   . Heart failure Mother   . Cirrhosis Other   . High blood pressure Mother      Prior to Admission medications   Medication Sig Start Date End Date Taking? Authorizing Provider  ALPRAZolam Prudy Feeler) 0.5 MG tablet Take 0.5 mg by mouth at bedtime as needed for anxiety or sleep.    Yes Historical Provider, MD  Biotin 5000 MCG TABS Take 1 tablet by mouth daily.    Yes Historical Provider, MD  Calcium Carbonate-Vitamin D (CALTRATE 600+D PO) Take 1 tablet by mouth 2 (two) times daily.    Yes Historical Provider, MD  carvedilol (COREG) 6.25 MG tablet Take 0.5 tablets (3.125 mg total) by mouth 2 (two) times daily. Patient taking differently: Take 6.25 mg by mouth 2 (two) times daily.  08/16/13  Yes Laqueta Linden,  MD  diltiazem (CARDIZEM) 60 MG tablet Take 1 tablet (60 mg total) by mouth 2 (two) times daily. 12/13/14  Yes Laqueta Linden, MD  donepezil (ARICEPT) 10 MG tablet Take 1 tablet by mouth at bedtime. 08/06/14  Yes Historical Provider, MD  feeding supplement, ENSURE COMPLETE, (ENSURE COMPLETE) LIQD Take 237 mLs by mouth 3 (three) times daily between meals. 09/07/14  Yes Erick Blinks, MD  furosemide (LASIX) 20 MG tablet Take 1  tablet (20 mg total) by mouth daily. 12/27/14  Yes Laqueta Linden, MD  HYDROcodone-acetaminophen (NORCO/VICODIN) 5-325 MG per tablet Take 1 tablet by mouth every 8 (eight) hours as needed for moderate pain.   Yes Historical Provider, MD  memantine (NAMENDA) 10 MG tablet Take 1 tablet by mouth 2 (two) times daily. 08/09/14  Yes Historical Provider, MD  Multiple Vitamin (MULTIVITAMIN) tablet Take 1 tablet by mouth daily.    Yes Historical Provider, MD  potassium chloride (K-DUR) 10 MEQ tablet Take 2 tablets (20 mEq total) by mouth daily. Patient taking differently: Take 10 mEq by mouth daily.  10/02/14  Yes Laqueta Linden, MD  warfarin (COUMADIN) 2 MG tablet Take 0.5-1 tablets (1-2 mg total) by mouth every evening. Takes one half tablet (  total) on Tuesdays and Thursdays, then takes one tablet (  total) on all other days Patient taking differently: Take 2-4 mg by mouth See admin instructions. Takes ONE tablet (  total) every day except take TWO tablets (4 mg) on Tuesdays and Thursdays 08/23/14  Yes Laqueta Linden, MD  omeprazole (PRILOSEC) 40 MG capsule Take 1 capsule (40 mg total) by mouth daily. Patient not taking: Reported on 01/17/2015 05/03/14   Laqueta Linden, MD   Physical Exam: Filed Vitals:   01/17/15 1300 01/17/15 1330 01/17/15 1345 01/17/15 1400  BP: 134/82 142/109  149/104  Pulse: 78     Resp: Height:      Weight:      SpO2: 97%       General:  Appears calm and comfortable Eyes: Pupils, irises, lids appear unremarkable ENT: grossly normal hearing, lips  Neck: no LAD, masses or thyromegaly Cardiovascular: Irregular, normal rate, no m/r/g. No LE edema. Respiratory: CTA bilaterally, no w/r/r. Normal respiratory effort. Abdomen: soft, ntnd Skin: no rash or induration noted. Musculoskeletal: grossly normal tone BUE/BLE Psychiatric: grossly normal mood and affect, speech fluent and appropriate Neurologic: grossly non-focal.  Wt Readings from  Last 3 Encounters:  01/17/15 52.617 kg (116 lb)  12/27/14 52.617 kg (116 lb)  12/13/14 53.524 kg (118 lb)    Labs on Admission:  Basic Metabolic Panel:  Recent Labs Lab 01/17/15 0820  NA 142  K 5.6*  CL 104  CO2 24  GLUCOSE 125*  BUN 57*  CREATININE 1.63*  CALCIUM 9.8    Liver Function Tests:  Recent Labs Lab 01/17/15 0820  AST 97*  ALT 80*  ALKPHOS 145*  BILITOT 1.5*  PROT 7.1  ALBUMIN 4.0   CBC:  Recent Labs Lab 01/17/15 0820  WBC 10.5  NEUTROABS 7.9*  HGB 14.4  HCT 46.5*  MCV 82.2  PLT 216    Cardiac Enzymes:  Recent Labs Lab 01/17/15 0820  TROPONINI 0.03     Radiological Exams on Admission: Dg Chest Port 1 View  01/17/2015   CLINICAL DATA:  Heart palpitations since yesterday. Shortness of breath and fatigue.  EXAM: PORTABLE CHEST - 1 VIEW  COMPARISON:  Two views of the chest 10/05/2014.  FINDINGS: The heart  is enlarged. Emphysematous changes are again noted. Atherosclerotic calcifications are present at the arch. Moderate pulmonary vascular congestion is noted. Bibasilar airspace disease is present. A left pleural effusion is suspected. The visualized soft tissues and bony thorax are unremarkable.  IMPRESSION: 1. Cardiomegaly and moderate pulmonary vascular congestion suggesting congestive heart failure. 2. Probable left pleural effusion. 3. Mild bibasilar airspace disease, worse on the left. This likely reflects atelectasis.   Electronically Signed   By: Marin Roberts M.D.   On: 01/17/2015 08:59    EKG: Independently reviewed. As above.   Principal Problem:   Atrial fibrillation Active Problems:   Acute on chronic diastolic CHF (congestive heart failure)   Dementia   Acute renal failure superimposed on stage 3 chronic kidney disease   Assessment/Plan 1. Atrial fibrillation with RVR common improved on diltiazem infusion. 2. Acute on chronic diastolic heart failure. Suspect very mild component from rapid rate, clinically there is  little evidence of volume overload. She actually appears to be dry. 3. Possible AKI superimposed on CKD stage III-IV. 4. Hyperkalemia, likely secondary to poor kidney function in oral intake. Expect improvement with Lasix and fluids. No EKG changes seen. 5. FTT, long-standing 6. Hypertension, unilateral renal artery stenosis. 7. Diabetes mellitus type 2, stable, random blood sugar 125, anion gap 14. 8. Gait instability, followed by neurology 9. Dementia    Appears stable. Plan admission to stepdown unit, IV diltiazem, transition oral therapy when able.  She appears clinically dry. Trial of IV fluids.  Basic metabolic panel in the morning.  Sliding-scale insulin.  Physical therapy consultation.  Discussed in detail with husband at bedside.  Warfarin per pharmacy  Code Status: full code  DVT prophylaxis: warfarin Family Communication:  Disposition Plan/Anticipated LOS: admit, 2 days  Time spent: 60 minutes  Brendia Sacks, MD  [facility] Pager [phone] 01/17/2015, 2:31 PM

## 2015-01-17 NOTE — ED Provider Notes (Signed)
CSN: [id number]     Arrival date & time 01/17/15  0865 History  This chart was scribed for Donnetta Hutching, MD by Placido Sou, ED scribe. This patient was seen in room APA02/APA02 and the patient's care was started at 8:42 AM.    Chief Complaint  Patient presents with  . Palpitations    The history is provided by the patient, the spouse and a caregiver. The history is limited by the condition of the patient. No language interpreter was used.    LEVEL 5 CAVEAT: ACUITY OF CONDITION   HPI Comments: [name] is a 81 y.o. female, with a history of atrial fibrillation, HTN and CAD, who presents to the Emergency Department by her caregiver and her husband, complaining of worsening, generalized, weakness with onset 2 weeks ago. Pt's care provider says that she went to her PCP yesterday and notes she had a low O2 level but additionally notes they didn't complete an EKG but did confirm doing blood work with no results as of yet. Pt does note a lack of appetite as well as intermittent SOB. Pt denies CP, leg swelling, and trouble urinating.  PCP: Dr. Sherril Croon  Past Medical History  Diagnosis Date  . Sinoatrial node dysfunction      SINUS BRADYCARDIA  . Hypertension     unspecified  . Atrial fibrillation   . Encounter for long-term (current) use of other medications     COUMADIN THERAPY  . Lower extremity weakness   . Sinus bradycardia   . Coronary artery disease   . Dementia   . Bilateral renal artery stenosis     MODERATE by dopplers August 2012.  Marland Kitchen Chronic renal insufficiency    Past Surgical History  Procedure Laterality Date  . Knee arthroscopy    . Cardioversion    . Colonoscopy  05/2011    Dr. Allena Katz: normal. no bx.  . Cholecystectomy    . Back surgery     Family History  Problem Relation Age of Onset  . Cancer Brother   . Heart failure Mother   . Cirrhosis Other   . High blood pressure Mother    History  Substance Use Topics  . Smoking status: Never Smoker   .  Smokeless tobacco: Never Used     Comment: Never smoked  . Alcohol Use: No   OB History    No data available     Review of Systems  Constitutional: Positive for appetite change.  Respiratory: Positive for shortness of breath.    A complete 10 system review of systems was obtained and all systems are negative except as noted in the HPI and PMH.   Allergies  Review of patient's allergies indicates no known allergies.  Home Medications   Prior to Admission medications   Medication Sig Start Date End Date Taking? Authorizing Provider  ALPRAZolam Prudy Feeler) 0.5 MG tablet Take 0.5 mg by mouth at bedtime as needed for anxiety or sleep.    Yes Historical Provider, MD  Biotin 5000 MCG TABS Take 1 tablet by mouth daily.    Yes Historical Provider, MD  Calcium Carbonate-Vitamin D (CALTRATE 600+D PO) Take 1 tablet by mouth 2 (two) times daily.    Yes Historical Provider, MD  carvedilol (COREG) 6.25 MG tablet Take 0.5 tablets (3.125 mg total) by mouth 2 (two) times daily. Patient taking differently: Take 6.25 mg by mouth 2 (two) times daily.  08/16/13  Yes Laqueta Linden, MD  diltiazem (CARDIZEM) 60 MG tablet Take  1 tablet (60 mg total) by mouth 2 (two) times daily. 12/13/14  Yes Laqueta Linden, MD  donepezil (ARICEPT) 10 MG tablet Take 1 tablet by mouth at bedtime. 08/06/14  Yes Historical Provider, MD  feeding supplement, ENSURE COMPLETE, (ENSURE COMPLETE) LIQD Take 237 mLs by mouth 3 (three) times daily between meals. 09/07/14  Yes Erick Blinks, MD  furosemide (LASIX) 20 MG tablet Take 1 tablet (20 mg total) by mouth daily. 12/27/14  Yes Laqueta Linden, MD  HYDROcodone-acetaminophen (NORCO/VICODIN) 5-325 MG per tablet Take 1 tablet by mouth every 8 (eight) hours as needed for moderate pain.   Yes Historical Provider, MD  memantine (NAMENDA) 10 MG tablet Take 1 tablet by mouth 2 (two) times daily. 08/09/14  Yes Historical Provider, MD  Multiple Vitamin (MULTIVITAMIN) tablet Take 1 tablet  by mouth daily.    Yes Historical Provider, MD  potassium chloride (K-DUR) 10 MEQ tablet Take 2 tablets (20 mEq total) by mouth daily. Patient taking differently: Take 10 mEq by mouth daily.  10/02/14  Yes Laqueta Linden, MD  warfarin (COUMADIN) 2 MG tablet Take 0.5-1 tablets (1-2 mg total) by mouth every evening. Takes one half tablet (  total) on Tuesdays and Thursdays, then takes one tablet (  total) on all other days Patient taking differently: Take 2-4 mg by mouth See admin instructions. Takes ONE tablet (  total) every day except take TWO tablets (4 mg) on Tuesdays and Thursdays 08/23/14  Yes Laqueta Linden, MD  omeprazole (PRILOSEC) 40 MG capsule Take 1 capsule (40 mg total) by mouth daily. Patient not taking: Reported on 01/17/2015 05/03/14   Laqueta Linden, MD   BP 149/104 mmHg  Pulse 78  Resp 17  Ht  (1.753 m)  Wt 116 lb (52.617 kg)  BMI 17.12 kg/m2  SpO2 97% Physical Exam  Constitutional: She is oriented to person, place, and time. She appears well-developed and well-nourished.  Thin, pale, weak appearing  HENT:  Head: Normocephalic and atraumatic.  Eyes: Conjunctivae and EOM are normal. Pupils are equal, round, and reactive to light.  Neck: Normal range of motion. Neck supple.  Cardiovascular: Normal rate and regular rhythm.   Tachycardia and irregularly irregular   Pulmonary/Chest: Effort normal and breath sounds normal.  Abdominal: Soft. Bowel sounds are normal.  Musculoskeletal: Normal range of motion.  Neurological: She is oriented to person, place, and time.  Skin: Skin is warm and dry.  Psychiatric: She has a normal mood and affect. Her behavior is normal.  Nursing note and vitals reviewed.   ED Course  Procedures  DIAGNOSTIC STUDIES: Oxygen Saturation is 97% on RA, normal by my interpretation.    COORDINATION OF CARE: 8:46 AM Discussed treatment plan with pt at bedside including a urinalysis and general screening labs and pt agreed to  plan.  Labs Review Labs Reviewed  COMPREHENSIVE METABOLIC PANEL - Abnormal; Notable for the following:    Potassium 5.6 (*)    Glucose, Bld 125 (*)    BUN 57 (*)    Creatinine, Ser 1.63 (*)    AST 97 (*)    ALT 80 (*)    Alkaline Phosphatase 145 (*)    Total Bilirubin 1.5 (*)    GFR calc non Af Amer 28 (*)    GFR calc Af Amer 33 (*)    All other components within normal limits  CBC WITH DIFFERENTIAL/PLATELET - Abnormal; Notable for the following:    RBC 5.66 (*)    HCT 46.5 (*)  MCH 25.4 (*)    RDW 19.2 (*)    Neutro Abs 7.9 (*)    All other components within normal limits  TROPONIN I  URINALYSIS, ROUTINE W REFLEX MICROSCOPIC (NOT AT [facility])    Imaging Review Dg Chest Port 1 View  01/17/2015   CLINICAL DATA:  Heart palpitations since yesterday. Shortness of breath and fatigue.  EXAM: PORTABLE CHEST - 1 VIEW  COMPARISON:  Two views of the chest 10/05/2014.  FINDINGS: The heart is enlarged. Emphysematous changes are again noted. Atherosclerotic calcifications are present at the arch. Moderate pulmonary vascular congestion is noted. Bibasilar airspace disease is present. A left pleural effusion is suspected. The visualized soft tissues and bony thorax are unremarkable.  IMPRESSION: 1. Cardiomegaly and moderate pulmonary vascular congestion suggesting congestive heart failure. 2. Probable left pleural effusion. 3. Mild bibasilar airspace disease, worse on the left. This likely reflects atelectasis.   Electronically Signed   By: Marin Roberts M.D.   On: 01/17/2015 08:59     EKG Interpretation   Date/Time:  Thursday January 17 2015 08:03:37 EDT Ventricular Rate:  151 PR Interval:    QRS Duration: 79 QT Interval:  271 QTC Calculation: 429 R Axis:   109 Text Interpretation:  Atrial fibrillation with rapid V-rate Right axis  deviation Minimal ST depression Borderline T abnormalities, diffuse leads  Baseline wander in lead(s) V6 Confirmed by Adriana Simas  MD, Jiaire Rosebrook ([id number]) on  01/17/2015  8:37:10 AM     CRITICAL CARE Performed by: Donnetta Hutching  ?  Total critical care time: 30 Critical care time was exclusive of separately billable procedures and treating other patients.  Critical care was necessary to treat or prevent imminent or life-threatening deterioration.  Critical care was time spent personally by me on the following activities: development of treatment plan with patient and/or surrogate as well as nursing, discussions with consultants, evaluation of patient's response to treatment, examination of patient, obtaining history from patient or surrogate, ordering and performing treatments and interventions, ordering and review of laboratory studies, ordering and review of radiographic studies, pulse oximetry and re-evaluation of patient's condition. MDM   Final diagnoses:  Atrial fibrillation, unspecified  Acute congestive heart failure, unspecified congestive heart failure type  Hyperkalemia    Elderly patient with rapid atrial fibrillation and mild congestive heart failure. She has responded well to IV Cardizem and IV Lasix. Rate is now below 80. Troponin negative. Potassium 5.6. Admit to stepdown.     Donnetta Hutching, MD 01/17/15 [phone]

## 2015-01-17 NOTE — Progress Notes (Signed)
Pt transferred to ICU from ED. Pt is alert and oriented to self and place. She is pleasant and has no complaints at this time. Husband is at bedside. Last VS are temp 96.2 (rectal; MD is aware), BP 150/89, HR 75, RR 14, 98% 2L O2 [place]

## 2015-01-17 NOTE — Progress Notes (Signed)
ANTICOAGULATION CONSULT NOTE - Initial Consult  Pharmacy Consult for Coumadin (chronic Rx PTA) Indication: atrial fibrillation  No Known Allergies  Patient Measurements: Height: 5\' 5"  (165.1 cm) Weight: 115 lb 15.4 oz (52.6 kg) IBW/kg (Calculated) : 57  Vital Signs: Temp: 96.2 F (35.7 C) (06/09 1541) Temp Source: Rectal (06/09 1541) BP: 137/73 mmHg (06/09 1800) Pulse Rate: 87 (06/09 1800)  Labs:  Recent Labs  01/17/15 0820 01/17/15 1759  HGB 14.4  --   HCT 46.5*  --   PLT 216  --   LABPROT  --  25.7*  INR  --  2.37*  CREATININE 1.63*  --   TROPONINI 0.03  --    Estimated Creatinine Clearance: 21.7 mL/min (by C-G formula based on Cr of 1.63).  Medical History: Past Medical History  Diagnosis Date  . Sinoatrial node dysfunction      SINUS BRADYCARDIA  . Hypertension     unspecified  . Atrial fibrillation   . Encounter for long-term (current) use of other medications     COUMADIN THERAPY  . Lower extremity weakness   . Sinus bradycardia   . Coronary artery disease   . Dementia   . Bilateral renal artery stenosis     MODERATE by dopplers August 2012.  Marland Kitchen Chronic renal insufficiency     Medications:  Prescriptions prior to admission  Medication Sig Dispense Refill Last Dose  . ALPRAZolam (XANAX) 0.5 MG tablet Take 0.5 mg by mouth at bedtime as needed for anxiety or sleep.    01/16/2015 at Unknown time  . Biotin 5000 MCG TABS Take 1 tablet by mouth daily.    01/16/2015 at Unknown time  . Calcium Carbonate-Vitamin D (CALTRATE 600+D PO) Take 1 tablet by mouth 2 (two) times daily.    01/16/2015 at Unknown time  . carvedilol (COREG) 6.25 MG tablet Take 0.5 tablets (3.125 mg total) by mouth 2 (two) times daily. (Patient taking differently: Take 6.25 mg by mouth 2 (two) times daily. )   01/16/2015 at Unknown time  . diltiazem (CARDIZEM) 60 MG tablet Take 1 tablet (60 mg total) by mouth 2 (two) times daily. 60 tablet 6 01/17/2015 at Unknown time  . donepezil (ARICEPT) 10 MG  tablet Take 1 tablet by mouth at bedtime.  6 01/16/2015 at Unknown time  . feeding supplement, ENSURE COMPLETE, (ENSURE COMPLETE) LIQD Take 237 mLs by mouth 3 (three) times daily between meals. 90 Bottle 1 Past Week at Unknown time  . furosemide (LASIX) 20 MG tablet Take 1 tablet (20 mg total) by mouth daily.   01/16/2015 at Unknown time  . HYDROcodone-acetaminophen (NORCO/VICODIN) 5-325 MG per tablet Take 1 tablet by mouth every 8 (eight) hours as needed for moderate pain.   01/16/2015 at Unknown time  . memantine (NAMENDA) 10 MG tablet Take 1 tablet by mouth 2 (two) times daily.  3 01/16/2015 at Unknown time  . Multiple Vitamin (MULTIVITAMIN) tablet Take 1 tablet by mouth daily.    01/16/2015 at Unknown time  . potassium chloride (K-DUR) 10 MEQ tablet Take 2 tablets (20 mEq total) by mouth daily. (Patient taking differently: Take 10 mEq by mouth daily. ) 60 tablet 6 01/16/2015 at Unknown time  . warfarin (COUMADIN) 2 MG tablet Take 0.5-1 tablets (1-2 mg total) by mouth every evening. Takes one half tablet (1mg  total) on Tuesdays and Thursdays, then takes one tablet (2mg  total) on all other days (Patient taking differently: Take 2-4 mg by mouth See admin instructions. Takes ONE tablet (2mg   total) every day except take TWO tablets (4 mg) on Tuesdays and Thursdays) 90 tablet 2 01/16/2015 at 1800  . omeprazole (PRILOSEC) 40 MG capsule Take 1 capsule (40 mg total) by mouth daily. (Patient not taking: Reported on 01/17/2015) 30 capsule 3 Not Taking at Unknown time    Assessment: 81yo female on chronic Coumadin PTA.  INR therapeutic on admission.  Home dose listed above.  Goal of Therapy:  INR 2-3 Monitor platelets by anticoagulation protocol: Yes   Plan:  Coumadin  today x 1 (home dose) INR daily  [name] A 01/17/2015,7:43 PM

## 2015-01-17 NOTE — ED Notes (Signed)
Pt states she has been feeling palpations since yesterday; went to PCP and had blood work drawn but does not know results yet; also c/o feeling tired; denies Chest pain or SOB

## 2015-01-18 DIAGNOSIS — R740 Nonspecific elevation of levels of transaminase and lactic acid dehydrogenase [LDH]: Secondary | ICD-10-CM

## 2015-01-18 DIAGNOSIS — R7401 Elevation of levels of liver transaminase levels: Secondary | ICD-10-CM | POA: Diagnosis present

## 2015-01-18 DIAGNOSIS — I5032 Chronic diastolic (congestive) heart failure: Secondary | ICD-10-CM

## 2015-01-18 DIAGNOSIS — F0390 Unspecified dementia without behavioral disturbance: Secondary | ICD-10-CM

## 2015-01-18 DIAGNOSIS — R627 Adult failure to thrive: Secondary | ICD-10-CM

## 2015-01-18 DIAGNOSIS — I4891 Unspecified atrial fibrillation: Principal | ICD-10-CM

## 2015-01-18 LAB — PROTIME-INR
INR: 2.62 — ABNORMAL HIGH (ref 0.00–1.49)
Prothrombin Time: 27.7 seconds — ABNORMAL HIGH (ref 11.6–15.2)

## 2015-01-18 LAB — BASIC METABOLIC PANEL
Anion gap: 7 (ref 5–15)
BUN: 58 mg/dL — AB (ref 6–20)
CHLORIDE: 105 mmol/L (ref 101–111)
CO2: 29 mmol/L (ref 22–32)
Calcium: 8.8 mg/dL — ABNORMAL LOW (ref 8.9–10.3)
Creatinine, Ser: 1.42 mg/dL — ABNORMAL HIGH (ref 0.44–1.00)
GFR calc Af Amer: 38 mL/min — ABNORMAL LOW (ref >60)
GFR calc non Af Amer: 33 mL/min — ABNORMAL LOW (ref >60)
Glucose, Bld: 82 mg/dL (ref 65–99)
Potassium: 4.6 mmol/L (ref 3.5–5.1)
Sodium: 141 mmol/L (ref 135–145)

## 2015-01-18 LAB — VITAMIN B12: Vitamin B-12: 6254 pg/mL — ABNORMAL HIGH (ref 180–914)

## 2015-01-18 LAB — GLUCOSE, CAPILLARY
GLUCOSE-CAPILLARY: 149 mg/dL — AB (ref 65–99)
Glucose-Capillary: 94 mg/dL (ref 65–99)

## 2015-01-18 MED ORDER — POLYETHYLENE GLYCOL 3350 17 G PO PACK
17.0000 g | PACK | Freq: Two times a day (BID) | ORAL | Status: DC
  Administered 2015-01-18 – 2015-01-19 (×3): 17 g via ORAL
  Filled 2015-01-18 (×3): qty 1

## 2015-01-18 MED ORDER — WARFARIN SODIUM 2 MG PO TABS
2.0000 mg | ORAL_TABLET | Freq: Once | ORAL | Status: AC
  Administered 2015-01-18: 2 mg via ORAL
  Filled 2015-01-18: qty 1

## 2015-01-18 MED ORDER — HYDROCODONE-ACETAMINOPHEN 5-325 MG PO TABS
1.0000 | ORAL_TABLET | Freq: Three times a day (TID) | ORAL | Status: DC | PRN
  Filled 2015-01-18: qty 1

## 2015-01-18 MED ORDER — DILTIAZEM HCL 90 MG PO TABS: 90.0000 mg | ORAL_TABLET | Freq: Two times a day (BID) | ORAL | Status: DC

## 2015-01-18 MED ORDER — POTASSIUM CHLORIDE ER 10 MEQ PO TBCR: 10.0000 meq | EXTENDED_RELEASE_TABLET | Freq: Every day | ORAL | Status: DC

## 2015-01-18 MED ORDER — CARVEDILOL 6.25 MG PO TABS: 6.2500 mg | ORAL_TABLET | Freq: Two times a day (BID) | ORAL | Status: DC

## 2015-01-18 MED ORDER — PRO-STAT SUGAR FREE PO LIQD
30.0000 mL | Freq: Two times a day (BID) | ORAL | Status: DC
  Administered 2015-01-18 – 2015-01-19 (×2): 30 mL via ORAL
  Filled 2015-01-18 (×2): qty 30

## 2015-01-18 NOTE — Progress Notes (Signed)
Patient received scheduled Cardizem PO at 2200. Cardizem drip was stopped 2 hours later. Vital signs are stable. Continue to monitor the patient.

## 2015-01-18 NOTE — Progress Notes (Signed)
ANTICOAGULATION CONSULT NOTE - follow up  Pharmacy Consult for Coumadin (chronic Rx PTA) Indication: atrial fibrillation  No Known Allergies  Patient Measurements: Height:  (165.1 cm) Weight: 115 lb 15.4 oz (52.6 kg) IBW/kg (Calculated) : 57  Vital Signs: Temp: 97.6 F (36.4 C) (06/10 0737) Temp Source: Oral (06/10 0737) BP: 115/52 mmHg (06/10 0200) Pulse Rate: 70 (06/10 0200)  Labs:  Recent Labs  01/17/15 0820 01/17/15 1759 01/18/15 0300  HGB 14.4  --   --   HCT 46.5*  --   --   PLT 216  --   --   LABPROT  --  25.7* 27.7*  INR  --  2.37* 2.62*  CREATININE 1.63*  --  1.42*  TROPONINI 0.03  --   --    Estimated Creatinine Clearance: 24.9 mL/min (by C-G formula based on Cr of 1.42).  Medical History: Past Medical History  Diagnosis Date  . Sinoatrial node dysfunction      SINUS BRADYCARDIA  . Hypertension     unspecified  . Atrial fibrillation   . Encounter for long-term (current) use of other medications     COUMADIN THERAPY  . Lower extremity weakness   . Sinus bradycardia   . Coronary artery disease   . Dementia   . Bilateral renal artery stenosis     MODERATE by dopplers August 2012.  Marland Kitchen Chronic renal insufficiency    Medications:  Prescriptions prior to admission  Medication Sig Dispense Refill Last Dose  . ALPRAZolam (XANAX) 0.5 MG tablet Take 0.5 mg by mouth at bedtime as needed for anxiety or sleep.    01/16/2015 at Unknown time  . Biotin 5000 MCG TABS Take 1 tablet by mouth daily.    01/16/2015 at Unknown time  . Calcium Carbonate-Vitamin D (CALTRATE 600+D PO) Take 1 tablet by mouth 2 (two) times daily.    01/16/2015 at Unknown time  . carvedilol (COREG) 6.25 MG tablet Take 0.5 tablets (3.125 mg total) by mouth 2 (two) times daily. (Patient taking differently: Take 6.25 mg by mouth 2 (two) times daily. )   01/16/2015 at Unknown time  . diltiazem (CARDIZEM) 60 MG tablet Take 1 tablet (60 mg total) by mouth 2 (two) times daily. 60 tablet 6 01/17/2015 at  Unknown time  . donepezil (ARICEPT) 10 MG tablet Take 1 tablet by mouth at bedtime.  6 01/16/2015 at Unknown time  . feeding supplement, ENSURE COMPLETE, (ENSURE COMPLETE) LIQD Take 237 mLs by mouth 3 (three) times daily between meals. 90 Bottle 1 Past Week at Unknown time  . furosemide (LASIX) 20 MG tablet Take 1 tablet (20 mg total) by mouth daily.   01/16/2015 at Unknown time  . HYDROcodone-acetaminophen (NORCO/VICODIN) 5-325 MG per tablet Take 1 tablet by mouth every 8 (eight) hours as needed for moderate pain.   01/16/2015 at Unknown time  . memantine (NAMENDA) 10 MG tablet Take 1 tablet by mouth 2 (two) times daily.  3 01/16/2015 at Unknown time  . Multiple Vitamin (MULTIVITAMIN) tablet Take 1 tablet by mouth daily.    01/16/2015 at Unknown time  . potassium chloride (K-DUR) 10 MEQ tablet Take 2 tablets (20 mEq total) by mouth daily. (Patient taking differently: Take 10 mEq by mouth daily. ) 60 tablet 6 01/16/2015 at Unknown time  . warfarin (COUMADIN) 2 MG tablet Take 0.5-1 tablets (1-2 mg total) by mouth every evening. Takes one half tablet (  total) on Tuesdays and Thursdays, then takes one tablet (  total) on all other  days (Patient taking differently: Take 2-4 mg by mouth See admin instructions. Takes ONE tablet (2mg  total) every day except take TWO tablets (4 mg) on Tuesdays and Thursdays) 90 tablet 2 01/16/2015 at 1800  . omeprazole (PRILOSEC) 40 MG capsule Take 1 capsule (40 mg total) by mouth daily. (Patient not taking: Reported on 01/17/2015) 30 capsule 3 Not Taking at Unknown time   Assessment: 81yo female on chronic Coumadin PTA.  INR therapeutic on admission.  Home dose listed above.  Goal of Therapy:  INR 2-3 Monitor platelets by anticoagulation protocol: Yes   Plan:  Coumadin 2mg  today x 1 (home dose) INR daily  [name] A 01/18/2015,8:15 AM

## 2015-01-18 NOTE — Evaluation (Signed)
Physical Therapy Evaluation Patient Details Name: [name] MRN: [id number] DOB: 15-Mar-1929 Today's Date: 01/18/2015   History of Present Illness  81 year old woman with history of atrial fibrillation, presented to the emergency department with slow, progressive generalized weakness, failure to thrive with weight loss, poor appetite, shortness of breath. Initial evaluation revealed atrial fibrillation with rapid ventricular response, acute heart failure, acute on chronic renal failure, dehydration. She was started on diltiazem and referred for admission.  Clinical Impression   Pt was seen for evaluation.  Her CG was present and pt stated that she was ready to be up out of bed.  She also c/o constipation.  Her functional strength was found to be WNL and she could transfer without assist.  She was able to ambulate with a rolling walker 300' with good stability and pace.  She should be able to transfer to home with no problem.    Follow Up Recommendations No PT follow up    Equipment Recommendations  None recommended by PT    Recommendations for Other Services   none    Precautions / Restrictions Precautions Precautions: Fall Restrictions Weight Bearing Restrictions: No      Mobility  Bed Mobility Overal bed mobility: Modified Independent                Transfers Overall transfer level: Modified independent Equipment used: None                Ambulation/Gait Ambulation/Gait assistance: Supervision Ambulation Distance (Feet): 300 Feet Assistive device: Rolling walker (2 wheeled) Gait Pattern/deviations: WFL(Within Functional Limits)   Gait velocity interpretation: at or above normal speed for age/gender General Gait Details: pt cued for staying within the parameters of the walker                  Balance Overall balance assessment:  (balance is secure as long as she has hand held assist)                                            Pertinent Vitals/Pain Pain Assessment: No/denies pain    Home Living Family/patient expects to be discharged to:: Private residence Living Arrangements: Spouse/significant other Available Help at Discharge: Family;Personal care attendant;Available 24 hours/day Type of Home: House Home Access: Stairs to enter Entrance Stairs-Rails: None Entrance Stairs-Number of Steps: 1 Home Layout: Two level;Able to live on main level with bedroom/bathroom Home Equipment: Shower seat;Walker - 2 wheels;Cane - single point      Prior Function Level of Independence: Needs assistance   Gait / Transfers Assistance Needed: Pt uses walker mainly for ambulation in home.   ADL's / Homemaking Assistance Needed: aide assists with bathing, pt able to dress herself        Hand Dominance   Dominant Hand: Right    Extremity/Trunk Assessment   Upper Extremity Assessment: Overall WFL for tasks assessed           Lower Extremity Assessment: Overall WFL for tasks assessed      Cervical / Trunk Assessment: Kyphotic  Communication   Communication: No difficulties  Cognition Arousal/Alertness: Awake/alert Behavior During Therapy: WFL for tasks assessed/performed Overall Cognitive Status: History of cognitive impairments - at baseline  Assessment/Plan    PT Assessment Patent does not need any further PT services  PT Diagnosis     PT Problem List    PT Treatment Interventions     PT Goals (Current goals can be found in the Care Plan section) Acute Rehab PT Goals PT Goal Formulation: All assessment and education complete, DC therapy         Barriers to discharge  none                     End of Session Equipment Utilized During Treatment: Gait belt Activity Tolerance: Patient tolerated treatment well Patient left: in chair;with call bell/phone within reach;with family/visitor present (CG is with pt at all times)            Time: 1430-1500 PT Time Calculation (min) (ACUTE ONLY): 30 min   Charges:   PT Evaluation $Initial PT Evaluation Tier I: 1 Procedure     PT G CodesMyrlene Broker L  PT 01/18/2015, 3:11 PM [phone]

## 2015-01-18 NOTE — Care Management Note (Addendum)
Case Management Note  Patient Details  Name: [name] MRN: [id number] Date of Birth: July 14, 1929  Expected Discharge Date:                  Expected Discharge Plan:  Home/Self Care  In-House Referral:  NA  Discharge planning Services  CM Consult  Post Acute Care Choice:  NA Choice offered to:  NA  DME Arranged:    DME Agency:     HH Arranged:    HH Agency:     Status of Service:  Completed, signed off  Medicare Important Message Given:  Yes Date Medicare IM Given:  01/18/15 Medicare IM give by:  Kathyrn Sheriff, RN, MSN, CM  Date Additional Medicare IM Given:    Additional Medicare Important Message give by:     If discussed at Long Length of Stay Meetings, dates discussed:    Additional Comments: Pt is from home, lives with husband. Pt has PD sitter 5 days a week. Pt has cane, walker, wheelchair for PRN use but mostly uses the wheelchair per husband. Pt has no home O2 or neb machine. Pt plans to return home at discharge with PD sitter. No CM needs anticipated.  Malcolm Metro, RN 01/18/2015, 9:23 AM

## 2015-01-18 NOTE — Progress Notes (Signed)
Initial Nutrition Assessment  DOCUMENTATION CODES:  Severe malnutrition in context of chronic illness  INTERVENTION:  Ensure Enlive (each supplement provides 350kcal and 20 grams of protein), Prostat BID  NUTRITION DIAGNOSIS:  Malnutrition related to chronic illness as evidenced by severe depletion of muscle mass, severe depletion of body fat.  GOAL:  Patient will meet greater than or equal to 90% of their needs  MONITOR:  PO intake, Supplement acceptance, Labs, Weight trends, I & O's  REASON FOR ASSESSMENT:  Consult Assessment of nutrition requirement/status  ASSESSMENT: 81 year old woman with history of atrial fibrillation, presented to the emergency department with slow, progressive generalized weakness, failure to thrive with weight loss, poor appetite, shortness of breath. Initial evaluation revealed atrial fibrillation with rapid ventricular response, acute heart failure, acute on chronic renal failure, dehydration.   Pt just finishing breakfast at time of visit, ate 90%, per husband that is the most she ate in weeks. Pt states her appetite has been poor for a long time (over a year), but has been worse here lately. Wt history shows 10 Lb wt loss in just over 3 mo (8%, significant for time frame). Diety history shows low intake of most nutrients, especially protein. Encouraged pt to consume as much and as often of what she wants as possible. Suggested some ideas to increase protein intake. Pt sometimes drinks Boost at home and Ensure TID has been ordered. Will also order Prostat BID to increase protein intake. Will continue to monitor. Labs reviewed: Glu: 84 - 176, BUN 58, Cr 1.42, Ca 8.8  Height:  Ht Readings from Last 1 Encounters:  01/17/15 5\' 5"  (1.651 m)    Weight:  Wt Readings from Last 1 Encounters:  01/18/15 115 lb 15.4 oz (52.6 kg)    Ideal Body Weight:  57 kg  Wt Readings from Last 10 Encounters:  01/18/15 115 lb 15.4 oz (52.6 kg)  12/27/14 116 lb  (52.617 kg)  12/13/14 118 lb (53.524 kg)  10/05/14 125 lb (56.7 kg)  09/13/14 117 lb (53.071 kg)  09/07/14 117 lb (53.071 kg)  06/22/14 121 lb (54.885 kg)  05/07/14 123 lb (55.792 kg)  05/03/14 122 lb (55.339 kg)  04/19/14 134 lb 11.2 oz (61.1 kg)    BMI:  Body mass index is 19.3 kg/(m^2).  Estimated Nutritional Needs:  Kcal:  1300 - 1500  Protein:  65 - 80 g  Fluid:  per MD  Skin:  Reviewed, no issues  Diet Order:  Diet Heart Room service appropriate?: Yes; Fluid consistency:: Thin  EDUCATION NEEDS:  Education needs addressed   Intake/Output Summary (Last 24 hours) at 01/18/15 0937 Last data filed at 01/18/15 0820  Gross per 24 hour  Intake 623.55 ml  Output    550 ml  Net  73.55 ml    Last BM:  6/08  Leniya Breit A. [facility] Dietetic Intern Pager: [phone] 01/18/2015 9:45 AM

## 2015-01-18 NOTE — Progress Notes (Signed)
Called report to Misty Stanley RN who will be taking care of [name] on 300 she will be going to 332.

## 2015-01-18 NOTE — Progress Notes (Signed)
Brought patient the Advance Directives information packet. Discussed it briefly and left my contact information with her. Also shared we'll be having an community session educating and completing advance Directive on August 23 at 5:30.  

## 2015-01-18 NOTE — Progress Notes (Signed)
[facility] PROGRESS NOTE  IOWA KAPPES ZOX:[id number] DOB: Dec 28, 1928 DOA: 01/17/2015 PCP: Ignatius Specking., MD  Assessment/Plan: 1. Atrial fibrillation with RVR. Resolved. Rate controlled with oral. Home dose increased. Will continue. INR therapeutic. Continue coumadin.  Will transfer to tele. Will monitor closely.  2. Acute on chronic diastolic heart failure. resolved. 3. Possible AKI superimposed on CKD stage III-IV. Creatinine trending to baseline. Urine output good. Will decrease IV fluids slightly 4. Hyperkalemia, likely secondary to poor kidney function in oral intake. Resolved this am.  No EKG changes seen. 5. FTT, long-standing. Remains listless with "no appetite". Vitamin B12 in process, troponin negative. Check TSH.  6. Hypertension, unilateral renal artery stenosis. Fair control. monitor 7. Diabetes mellitus type 2, stable, random blood sugar 125. CBG range 94-176. Continue to monitor.  8. Gait instability, followed by neurology: will request PT consult.  9. Dementia: appears stable at baseline 10. Elevated transaminase: hx of same 1/16 that was likely related to acute on chronic diastolic heart failure.  Total bili 1.5. Exam benign. OP monitor  Code Status: full Family Communication: husband at bedside Disposition Plan: home when ready hopefully tomorrow   Consultants:  none  Procedures:  none  Antibiotics:  none  HPI/Subjective: Awake. Denies pain/discomfort. Alert   Objective: Filed Vitals:   01/18/15 1100  BP: 151/67  Pulse: 109  Temp:   Resp: 14    Intake/Output Summary (Last 24 hours) at 01/18/15 1118 Last data filed at 01/18/15 0820  Gross per 24 hour  Intake 623.55 ml  Output    550 ml  Net  73.55 ml   Filed Weights   01/17/15 0807 01/17/15 1521 01/18/15 0500  Weight: 52.617 kg (116 lb) 52.6 kg (115 lb 15.4 oz) 52.6 kg (115 lb 15.4 oz)    Exam:   General:  Thin frail  Cardiovascular: irregularly irregular no MGR no LE  edema  Respiratory: normal effort shallow BS clear  Abdomen: flat soft +BS non-tender to palpation. No organmegaly noted  Musculoskeletal: joints without swelling/erythema   Data Reviewed: Basic Metabolic Panel:  Recent Labs Lab 01/17/15 0820 01/18/15 0300  NA 142 141  K 5.6* 4.6  CL 104 105  CO2 24 29  GLUCOSE 125* 82  BUN 57* 58*  CREATININE 1.63* 1.42*  CALCIUM 9.8 8.8*   Liver Function Tests:  Recent Labs Lab 01/17/15 0820  AST 97*  ALT 80*  ALKPHOS 145*  BILITOT 1.5*  PROT 7.1  ALBUMIN 4.0   No results for input(s): LIPASE, AMYLASE in the last 168 hours. No results for input(s): AMMONIA in the last 168 hours. CBC:  Recent Labs Lab 01/17/15 0820  WBC 10.5  NEUTROABS 7.9*  HGB 14.4  HCT 46.5*  MCV 82.2  PLT 216   Cardiac Enzymes:  Recent Labs Lab 01/17/15 0820  TROPONINI 0.03   BNP (last 3 results)  Recent Labs  09/03/14 1207 10/05/14 1535  BNP 930.0* 567.0*    ProBNP (last 3 results)  Recent Labs  04/13/14 1611 04/18/14 0519  PROBNP 13309.0* 6636.0*    CBG:  Recent Labs Lab 01/17/15 2130 01/18/15 0735  GLUCAP 176* 94    Recent Results (from the past 240 hour(s))  MRSA PCR Screening     Status: None   Collection Time: 01/17/15  3:20 PM  Result Value Ref Range Status   MRSA by PCR NEGATIVE NEGATIVE Final    Comment:        The GeneXpert MRSA Assay (FDA approved for NASAL specimens only),  is one component of a comprehensive MRSA colonization surveillance program. It is not intended to diagnose MRSA infection nor to guide or monitor treatment for MRSA infections.      Studies: Dg Chest Port 1 View  01/17/2015   CLINICAL DATA:  Heart palpitations since yesterday. Shortness of breath and fatigue.  EXAM: PORTABLE CHEST - 1 VIEW  COMPARISON:  Two views of the chest 10/05/2014.  FINDINGS: The heart is enlarged. Emphysematous changes are again noted. Atherosclerotic calcifications are present at the arch. Moderate  pulmonary vascular congestion is noted. Bibasilar airspace disease is present. A left pleural effusion is suspected. The visualized soft tissues and bony thorax are unremarkable.  IMPRESSION: 1. Cardiomegaly and moderate pulmonary vascular congestion suggesting congestive heart failure. 2. Probable left pleural effusion. 3. Mild bibasilar airspace disease, worse on the left. This likely reflects atelectasis.   Electronically Signed   By: Marin Roberts M.D.   On: 01/17/2015 08:59    Scheduled Meds: . carvedilol  6.25 mg Oral BID WC  . diltiazem  90 mg Oral BID  . donepezil  10 mg Oral QHS  . feeding supplement (ENSURE ENLIVE)  237 mL Oral TID BM  . feeding supplement (PRO-STAT SUGAR FREE 64)  30 mL Oral BID  . furosemide  20 mg Oral Daily  . insulin aspart  0-9 Units Subcutaneous TID WC  . memantine  10 mg Oral BID  . sodium chloride  3 mL Intravenous Q12H  . warfarin  2 mg Oral Once  . Warfarin - Pharmacist Dosing Inpatient   Does not apply q1800   Continuous Infusions: . sodium chloride 700 mL (01/18/15 0835)    Principal Problem:   Atrial fibrillation Active Problems:   Acute on chronic diastolic CHF (congestive heart failure)   Dementia   Acute renal failure superimposed on stage 3 chronic kidney disease   Atrial fibrillation with RVR   FTT (failure to thrive) in adult   Hyperkalemia    Time spent: 30 minutes    [facility] M  [facility] Pager [phone]. If 7PM-7AM, please contact night-coverage at [email or web address], password [facility] 01/18/2015, 11:18 AM  LOS: 1 day

## 2015-01-19 DIAGNOSIS — I5033 Acute on chronic diastolic (congestive) heart failure: Secondary | ICD-10-CM

## 2015-01-19 DIAGNOSIS — R740 Nonspecific elevation of levels of transaminase and lactic acid dehydrogenase [LDH]: Secondary | ICD-10-CM

## 2015-01-19 LAB — COMPREHENSIVE METABOLIC PANEL
ALT: 43 U/L (ref 14–54)
AST: 35 U/L (ref 15–41)
Albumin: 3.2 g/dL — ABNORMAL LOW (ref 3.5–5.0)
Alkaline Phosphatase: 108 U/L (ref 38–126)
Anion gap: 8 (ref 5–15)
BILIRUBIN TOTAL: 0.8 mg/dL (ref 0.3–1.2)
BUN: 54 mg/dL — ABNORMAL HIGH (ref 6–20)
CHLORIDE: 107 mmol/L (ref 101–111)
CO2: 27 mmol/L (ref 22–32)
CREATININE: 1.23 mg/dL — AB (ref 0.44–1.00)
Calcium: 8.5 mg/dL — ABNORMAL LOW (ref 8.9–10.3)
GFR calc Af Amer: 46 mL/min — ABNORMAL LOW (ref >60)
GFR, EST NON AFRICAN AMERICAN: 39 mL/min — AB (ref >60)
GLUCOSE: 122 mg/dL — AB (ref 65–99)
Potassium: 4.5 mmol/L (ref 3.5–5.1)
SODIUM: 142 mmol/L (ref 135–145)
Total Protein: 5.6 g/dL — ABNORMAL LOW (ref 6.5–8.1)

## 2015-01-19 LAB — GLUCOSE, CAPILLARY
Glucose-Capillary: 187 mg/dL — ABNORMAL HIGH (ref 65–99)
Glucose-Capillary: 120 mg/dL — ABNORMAL HIGH (ref 65–99)

## 2015-01-19 LAB — PROTIME-INR
INR: 3.05 — ABNORMAL HIGH (ref 0.00–1.49)
PROTHROMBIN TIME: 31 s — AB (ref 11.6–15.2)

## 2015-01-19 MED ORDER — DILTIAZEM HCL 90 MG PO TABS: 90.0000 mg | ORAL_TABLET | Freq: Two times a day (BID) | ORAL | Status: DC

## 2015-01-19 MED ORDER — POTASSIUM CHLORIDE ER 10 MEQ PO TBCR: 10.0000 meq | EXTENDED_RELEASE_TABLET | Freq: Every day | ORAL | Status: DC

## 2015-01-19 NOTE — Discharge Summary (Signed)
Physician Discharge Summary  [name] ZOX:[id number] DOB: August 21, 1928 DOA: 01/17/2015  PCP: Ignatius Specking., MD  Admit date: 01/17/2015 Discharge date: 01/19/2015  Recommendations for Outpatient Follow-up:  1. Has close outpatient follow-up already arranged with cardiology next week. Note dosage adjustment to diltiazem. 2. Failure to thrive, progressive debility and weight loss. Consider outpatient palliative care consultation.   Follow-up Information    Follow up with Karen B., MD.   Specialty:  Internal Medicine   Why:  As needed   Contact information:   [address] [place] [address] 336 [phone]       Follow up with Karen Reining, NP.   Specialties:  Nurse Practitioner, Radiology, Cardiology   Why:  keep already scheduled appointment next week.   Contact information:   [address] [place] [address] [phone]      Discharge Diagnoses:  1. Atrial fibrillation with rapid ventricular response 2. Acute on chronic diastolic congestive heart failure secondary to rapid rate 3. Acute kidney injury superimposed on chronic kidney disease stage III 4. Hyperkalemia 5. Elevated LFTs 6. Failure to thrive 7. Diabetes mellitus type 2 8. Hypertension 9. Dementia  Discharge Condition: improved Disposition: home  Diet recommendation: heart healthy  Filed Weights   01/17/15 0807 01/17/15 1521 01/18/15 0500  Weight: 52.617 kg (116 lb) 52.6 kg (115 lb 15.4 oz) 52.6 kg (115 lb 15.4 oz)    History of present illness:  81 year old woman with history of atrial fibrillation, presented to the emergency department with slow, progressive generalized weakness, failure to thrive with weight loss, poor appetite, shortness of breath. Initial evaluation revealed atrial fibrillation with rapid ventricular response, acute heart failure, acute on chronic renal failure, dehydration. She was started on diltiazem and referred for admission.  Hospital Course:  [name] treated  with IV diltiazem, rate was controlled and she was transitioned to oral diltiazem at a higher dose. She had no recurrent rate control problems. She was seen by physical therapy with no recommendations for outpatient follow-up. Individual issues as below.  1. Atrial fibrillation with rapid ventricular response; rate now well controlled. Blood pressure stable. 2. Acute on chronic diastolic congestive heart failure, secondary to rapid rate rather than volume overload. Euvolemic. 3. Acute kidney injury superimposed on chronic kidney disease stage III, stable. Secondary to rapid rate. Improved with IVF. 4. Hyperkalemia secondary to above. Resolved.  5. Elevated AST, ALT, total bilirubin, alkaline phosphatase. Resolved. No abdominal pain and elevations were also noted 09/04/2014. Suspect elevation related to passive congestion. 6. Failure to thrive. Oral intake improved in hospital. 7. Hypertension. Stable.  8. Diabetes mellitus type 2. Stable. Glucose 122. AG 8. 9. Chronic gait instability followed by neurology. 10. Dementia.  Consultants:  Physical therapy: No follow-up. Ambulate 300 feet with again steady pace.  Procedures:  none  Discharge Instructions  Discharge Instructions    Diet - low sodium heart healthy    Complete by:  As directed      Diet Carb Modified    Complete by:  As directed      Discharge instructions    Complete by:  As directed   Call your physician or seek immediate medical attention for shortness of breath, swelling, weight gain or worsening of condition.     Increase activity slowly    Complete by:  As directed           Note Coreg dose was not changed. Patient was already taking 6.25 mg BID  Current Discharge Medication List  CONTINUE these medications which have CHANGED   Details  carvedilol (COREG) 6.25 MG tablet Take 1 tablet (6.25 mg total) by mouth 2 (two) times daily.    diltiazem (CARDIZEM) 90 MG tablet Take 1 tablet (90 mg total) by mouth  2 (two) times daily. Qty: 60 tablet, Refills: 0    potassium chloride (K-DUR) 10 MEQ tablet Take 1 tablet (10 mEq total) by mouth daily. Qty: 30 tablet, Refills: 0      CONTINUE these medications which have NOT CHANGED   Details  ALPRAZolam (XANAX) 0.5 MG tablet Take 0.5 mg by mouth at bedtime as needed for anxiety or sleep.     Biotin 5000 MCG TABS Take 1 tablet by mouth daily.     Calcium Carbonate-Vitamin D (CALTRATE 600+D PO) Take 1 tablet by mouth 2 (two) times daily.     donepezil (ARICEPT) 10 MG tablet Take 1 tablet by mouth at bedtime. Refills: 6    feeding supplement, ENSURE COMPLETE, (ENSURE COMPLETE) LIQD Take 237 mLs by mouth 3 (three) times daily between meals. Qty: 90 Bottle, Refills: 1    furosemide (LASIX) 20 MG tablet Take 1 tablet (20 mg total) by mouth daily.    HYDROcodone-acetaminophen (NORCO/VICODIN) 5-325 MG per tablet Take 1 tablet by mouth every 8 (eight) hours as needed for moderate pain.    memantine (NAMENDA) 10 MG tablet Take 1 tablet by mouth 2 (two) times daily. Refills: 3    Multiple Vitamin (MULTIVITAMIN) tablet Take 1 tablet by mouth daily.     warfarin (COUMADIN) 2 MG tablet Take 0.5-1 tablets (1-2 mg total) by mouth every evening. Takes one half tablet (  total) on Tuesdays and Thursdays, then takes one tablet (  total) on all other days Qty: 90 tablet, Refills: 2      STOP taking these medications     omeprazole (PRILOSEC) 40 MG capsule        No Known Allergies  The results of significant diagnostics from this hospitalization (including imaging, microbiology, ancillary and laboratory) are listed below for reference.    Significant Diagnostic Studies: Dg Chest Port 1 View  01/17/2015   CLINICAL DATA:  Heart palpitations since yesterday. Shortness of breath and fatigue.  EXAM: PORTABLE CHEST - 1 VIEW  COMPARISON:  Two views of the chest 10/05/2014.  FINDINGS: The heart is enlarged. Emphysematous changes are again noted.  Atherosclerotic calcifications are present at the arch. Moderate pulmonary vascular congestion is noted. Bibasilar airspace disease is present. A left pleural effusion is suspected. The visualized soft tissues and bony thorax are unremarkable.  IMPRESSION: 1. Cardiomegaly and moderate pulmonary vascular congestion suggesting congestive heart failure. 2. Probable left pleural effusion. 3. Mild bibasilar airspace disease, worse on the left. This likely reflects atelectasis.   Electronically Signed   By: Marin Roberts M.D.   On: 01/17/2015 08:59    Microbiology: Recent Results (from the past 240 hour(s))  MRSA PCR Screening     Status: None   Collection Time: 01/17/15  3:20 PM  Result Value Ref Range Status   MRSA by PCR NEGATIVE NEGATIVE Final    Comment:        The GeneXpert MRSA Assay (FDA approved for NASAL specimens only), is one component of a comprehensive MRSA colonization surveillance program. It is not intended to diagnose MRSA infection nor to guide or monitor treatment for MRSA infections.      Labs: Basic Metabolic Panel:  Recent Labs Lab 01/17/15 0820 01/18/15 0300 01/19/15 0623  NA 142 141  142  K 5.6* 4.6 4.5  CL 104 105 107  CO2 GLUCOSE 125* 82 122*  BUN 57* 58* 54*  CREATININE 1.63* 1.42* 1.23*  CALCIUM 9.8 8.8* 8.5*   Liver Function Tests:  Recent Labs Lab 01/17/15 0820 01/19/15 0623  AST 97* 35  ALT 80* 43  ALKPHOS 145* 108  BILITOT 1.5* 0.8  PROT 7.1 5.6*  ALBUMIN 4.0 3.2*   CBC:  Recent Labs Lab 01/17/15 0820  WBC 10.5  NEUTROABS 7.9*  HGB 14.4  HCT 46.5*  MCV 82.2  PLT 216   Cardiac Enzymes:  Recent Labs Lab 01/17/15 0820  TROPONINI 0.03      CBG:  Recent Labs Lab 01/17/15 2130 01/18/15 0735 01/18/15 1629 01/19/15 0730 01/19/15 1108  GLUCAP 176* 94 149* 187* 120*    Principal Problem:   Atrial fibrillation with RVR Active Problems:   HTN (hypertension)   Atrial fibrillation   Chronic diastolic  heart failure   Acute on chronic diastolic CHF (congestive heart failure)   Dementia   Acute renal failure superimposed on stage 3 chronic kidney disease   FTT (failure to thrive) in adult   Hyperkalemia   Elevated transaminase level   Time coordinating discharge: 35 minutes  Signed:  Brendia Sacks, MD [facility] 01/19/2015, 3:32 PM

## 2015-01-19 NOTE — Progress Notes (Signed)
ANTICOAGULATION CONSULT NOTE - follow up  Pharmacy Consult for Coumadin (chronic Rx PTA) Indication: atrial fibrillation  No Known Allergies  Patient Measurements: Height:  (165.1 cm) Weight: 115 lb 15.4 oz (52.6 kg) IBW/kg (Calculated) : 57  Vital Signs: Temp: 98.2 F (36.8 C) (06/11 0600) Temp Source: Oral (06/11 0600) BP: 130/66 mmHg (06/11 0600) Pulse Rate: 72 (06/11 0600)  Labs:  Recent Labs  01/17/15 0820 01/17/15 1759 01/18/15 0300 01/19/15 0623  HGB 14.4  --   --   --   HCT 46.5*  --   --   --   PLT 216  --   --   --   LABPROT  --  25.7* 27.7* 31.0*  INR  --  2.37* 2.62* 3.05*  CREATININE 1.63*  --  1.42* 1.23*  TROPONINI 0.03  --   --   --    Estimated Creatinine Clearance: 28.8 mL/min (by C-G formula based on Cr of 1.23).  Medical History: Past Medical History  Diagnosis Date  . Sinoatrial node dysfunction      SINUS BRADYCARDIA  . Hypertension     unspecified  . Atrial fibrillation   . Encounter for long-term (current) use of other medications     COUMADIN THERAPY  . Lower extremity weakness   . Sinus bradycardia   . Coronary artery disease   . Dementia   . Bilateral renal artery stenosis     MODERATE by dopplers August 2012.  Marland Kitchen Chronic renal insufficiency    Medications:  Prescriptions prior to admission  Medication Sig Dispense Refill Last Dose  . ALPRAZolam (XANAX) 0.5 MG tablet Take 0.5 mg by mouth at bedtime as needed for anxiety or sleep.    01/16/2015 at Unknown time  . Biotin 5000 MCG TABS Take 1 tablet by mouth daily.    01/16/2015 at Unknown time  . Calcium Carbonate-Vitamin D (CALTRATE 600+D PO) Take 1 tablet by mouth 2 (two) times daily.    01/16/2015 at Unknown time  . diltiazem (CARDIZEM) 60 MG tablet Take 1 tablet (60 mg total) by mouth 2 (two) times daily. 60 tablet 6 01/17/2015 at Unknown time  . donepezil (ARICEPT) 10 MG tablet Take 1 tablet by mouth at bedtime.  6 01/16/2015 at Unknown time  . feeding supplement, ENSURE COMPLETE,  (ENSURE COMPLETE) LIQD Take 237 mLs by mouth 3 (three) times daily between meals. 90 Bottle 1 Past Week at Unknown time  . furosemide (LASIX) 20 MG tablet Take 1 tablet (20 mg total) by mouth daily.   01/16/2015 at Unknown time  . HYDROcodone-acetaminophen (NORCO/VICODIN) 5-325 MG per tablet Take 1 tablet by mouth every 8 (eight) hours as needed for moderate pain.   01/16/2015 at Unknown time  . memantine (NAMENDA) 10 MG tablet Take 1 tablet by mouth 2 (two) times daily.  3 01/16/2015 at Unknown time  . Multiple Vitamin (MULTIVITAMIN) tablet Take 1 tablet by mouth daily.    01/16/2015 at Unknown time  . warfarin (COUMADIN) 2 MG tablet Take 0.5-1 tablets (1-2 mg total) by mouth every evening. Takes one half tablet (  total) on Tuesdays and Thursdays, then takes one tablet (  total) on all other days (Patient taking differently: Take 2-4 mg by mouth See admin instructions. Takes ONE tablet (  total) every day except take TWO tablets (4 mg) on Tuesdays and Thursdays) 90 tablet 2 01/16/2015 at 1800  . [DISCONTINUED] carvedilol (COREG) 6.25 MG tablet Take 0.5 tablets (3.125 mg total) by mouth 2 (two) times daily. (  Patient taking differently: Take 6.25 mg by mouth 2 (two) times daily. )   01/16/2015 at Unknown time  . [DISCONTINUED] potassium chloride (K-DUR) 10 MEQ tablet Take 2 tablets (20 mEq total) by mouth daily. (Patient taking differently: Take 10 mEq by mouth daily. ) 60 tablet 6 01/16/2015 at Unknown time  . omeprazole (PRILOSEC) 40 MG capsule Take 1 capsule (40 mg total) by mouth daily. (Patient not taking: Reported on 01/17/2015) 30 capsule 3 Not Taking at Unknown time   Assessment: 81yo female on chronic Coumadin PTA.  INR therapeutic on admission.  Home dose listed above. INR has trended upward to slightly above goal this AM.  Goal of Therapy:  INR 2-3 Monitor platelets by anticoagulation protocol: Yes   Plan:  Hold Coumadin today, due to elevated INR INR daily  Raquel James, Nijah Orlich  Bennett 01/19/2015,9:01 AM

## 2015-01-19 NOTE — Progress Notes (Signed)
PROGRESS NOTE  TOMMI CREPEAU WUJ:[id number] DOB: 02-17-1929 DOA: 01/17/2015 PCP: Ignatius Specking., MD  Summary: 81 year old woman with history of atrial fibrillation, presented to the emergency department with slow, progressive generalized weakness, failure to thrive with weight loss, poor appetite, shortness of breath. Initial evaluation revealed atrial fibrillation with rapid ventricular response, acute heart failure, acute on chronic renal failure, dehydration. She was started on diltiazem and referred for admission.  Assessment/Plan: 1. Atrial fibrillation with rapid ventricular response; rate now well controlled. Blood pressure stable. 2. Acute on chronic diastolic congestive heart failure, secondary to rapid rate rather than volume overload. Euvolemic. 3. Acute kidney injury superimposed on chronic kidney disease stage III, stable. Secondary to rapid rate. Improved with IVF. 4. Hyperkalemia secondary to above. Resolved.  5. Elevated AST, ALT, total bilirubin, alkaline phosphatase. Resolved. No abdominal pain and elevations were also noted 09/04/2014. Suspect elevation related to passive congestion. 6. Failure to thrive. Oral intake improved in hospital. 7. Hypertension. Stable.  8. Diabetes mellitus type 2. Stable. Glucose 122. AG 8. 9. Chronic gait instability followed by neurology. 10. Dementia.   Continue increased dose of diltiazem. Continue beta blocker. Close outpatient follow-up with cardiology already planned next week.  Consider palliative care involvement as an outpatient given her progressive failure to thrive, weight loss and debility.  Brendia Sacks, MD  [facility]  Pager [phone] If 7PM-7AM, please contact night-coverage at [email or web address], password [facility] 01/19/2015, 2:45 PM  LOS: 2 days   Consultants:  Physical therapy: No follow-up. Ambulate 300 feet with again steady pace.  Procedures:    Antibiotics:    HPI/Subjective: Doing well, no pain, no  n/v. Breathing ok.  Objective: Filed Vitals:   01/18/15 2142 01/19/15 0600 01/19/15 0857 01/19/15 1421  BP: 110/62 130/66 144/70 126/53  Pulse: 67 72  61  Temp: 97.9 F (36.6 C) 98.2 F (36.8 C)  98.3 F (36.8 C)  TempSrc: Oral Oral  Oral  Resp:  20  20  Height:      Weight:      SpO2: 100% 92%  96%    Intake/Output Summary (Last 24 hours) at 01/19/15 1445 Last data filed at 01/19/15 1300  Gross per 24 hour  Intake   1323 ml  Output      0 ml  Net   1323 ml     Filed Weights   01/17/15 0807 01/17/15 1521 01/18/15 0500  Weight: 52.617 kg (116 lb) 52.6 kg (115 lb 15.4 oz) 52.6 kg (115 lb 15.4 oz)    Exam:     Afebrile, vital signs stable, no hypoxia General:  Appears calm and comfortable Cardiovascular: irregular, normal rate, no m/r/g. No LE edema. Telemetry: atrial fibrillation Respiratory: CTA bilaterally, no w/r/r. Normal respiratory effort. Abdomen: soft, ntnd, no RUQ pain Psychiatric: grossly normal mood and affect, speech fluent and appropriate  Data reviewed:  Blood sugars stable  Basic metabolic panel with improvement of chronic kidney disease. BUN trending downwards.  INR therapeutic.  Scheduled Meds: . carvedilol  6.25 mg Oral BID WC  . diltiazem  90 mg Oral BID  . donepezil  10 mg Oral QHS  . feeding supplement (ENSURE ENLIVE)  237 mL Oral TID BM  . feeding supplement (PRO-STAT SUGAR FREE 64)  30 mL Oral BID  . furosemide  20 mg Oral Daily  . insulin aspart  0-9 Units Subcutaneous TID WC  . memantine  10 mg Oral BID  . polyethylene glycol  17 g Oral BID  . sodium chloride  3 mL Intravenous Q12H  . Warfarin - Pharmacist Dosing Inpatient   Does not apply q1800   Continuous Infusions: . sodium chloride 50 mL/hr (01/19/15 0037)    Principal Problem:   Atrial fibrillation with RVR Active Problems:   HTN (hypertension)   Atrial fibrillation   Chronic diastolic heart failure   Acute on chronic diastolic CHF (congestive heart failure)    Dementia   Acute renal failure superimposed on stage 3 chronic kidney disease   FTT (failure to thrive) in adult   Hyperkalemia   Elevated transaminase level

## 2015-01-19 NOTE — Progress Notes (Signed)
Pt's IV catheter removed and intact. Pt's IV site clean dry and intact. Discharge instructions, medications and follow up appointments were reviewed and discussed with patient's husband. All questions were answered and no further questions at this time. Pt in stable condition and no acute distress at time of discharge. Pt escorted by nurse tech.

## 2015-01-22 ENCOUNTER — Encounter: Payer: Self-pay | Admitting: Adult Health

## 2015-01-22 ENCOUNTER — Ambulatory Visit (INDEPENDENT_AMBULATORY_CARE_PROVIDER_SITE_OTHER): Payer: Medicare Other | Admitting: Adult Health

## 2015-01-22 VITALS — BP 128/68 | HR 89 | Ht 65.0 in | Wt 121.0 lb

## 2015-01-22 DIAGNOSIS — I701 Atherosclerosis of renal artery: Secondary | ICD-10-CM | POA: Diagnosis not present

## 2015-01-22 DIAGNOSIS — I5022 Chronic systolic (congestive) heart failure: Secondary | ICD-10-CM | POA: Diagnosis not present

## 2015-01-22 DIAGNOSIS — I482 Chronic atrial fibrillation, unspecified: Secondary | ICD-10-CM

## 2015-01-22 MED ORDER — FUROSEMIDE 20 MG PO TABS: ORAL_TABLET | ORAL | Status: DC

## 2015-01-22 NOTE — Progress Notes (Deleted)
Name: [name]    DOB: [birth year]  Age: 81 y.o.  MR#: [id number]       PCP:  Ignatius Specking., MD      Insurance: Payor: MEDICARE / Plan: MEDICARE PART A AND B / Product Type: *No Product type* /   CC:    Chief Complaint  Patient presents with  . Atrial Fibrillation  . Congestive Heart Failure    Diastolic  . Hypertension    VS Filed Vitals:   01/22/15 1443  BP: 128/68  Pulse: 89  Height: 5\' 5"  (1.651 m)  Weight: 121 lb (54.885 kg)  SpO2: 98%    Weights Current Weight  01/22/15 121 lb (54.885 kg)  01/18/15 115 lb 15.4 oz (52.6 kg)  12/27/14 116 lb (52.617 kg)    Blood Pressure  BP Readings from Last 3 Encounters:  01/22/15 128/68  01/19/15 126/53  12/27/14 120/70     Admit date:  (Not on file) Last encounter with RMR:  09/13/2014   Allergy Review of patient's allergies indicates no known allergies.  Current Outpatient Prescriptions  Medication Sig Dispense Refill  . ALPRAZolam (XANAX) 0.5 MG tablet Take 0.5 mg by mouth at bedtime as needed for anxiety or sleep.     . Biotin 5000 MCG TABS Take 1 tablet by mouth daily.     . Calcium Carbonate-Vitamin D (CALTRATE 600+D PO) Take 1 tablet by mouth 2 (two) times daily.     . carvedilol (COREG) 6.25 MG tablet Take 1 tablet (6.25 mg total) by mouth 2 (two) times daily.    Marland Kitchen diltiazem (CARDIZEM) 90 MG tablet Take 1 tablet (90 mg total) by mouth 2 (two) times daily. 60 tablet 0  . donepezil (ARICEPT) 10 MG tablet Take 1 tablet by mouth at bedtime.  6  . feeding supplement, ENSURE COMPLETE, (ENSURE COMPLETE) LIQD Take 237 mLs by mouth 3 (three) times daily between meals. 90 Bottle 1  . furosemide (LASIX) 20 MG tablet Take 1 tablet (20 mg total) by mouth daily.    Marland Kitchen HYDROcodone-acetaminophen (NORCO/VICODIN) 5-325 MG per tablet Take 1 tablet by mouth every 8 (eight) hours as needed for moderate pain.    . memantine (NAMENDA) 10 MG tablet Take 1 tablet by mouth 2 (two) times daily.  3  . Multiple Vitamin (MULTIVITAMIN)  tablet Take 1 tablet by mouth daily.     . potassium chloride (K-DUR) 10 MEQ tablet Take 1 tablet (10 mEq total) by mouth daily. 30 tablet 0  . warfarin (COUMADIN) 2 MG tablet Take 0.5-1 tablets (1-2 mg total) by mouth every evening. Takes one half tablet (1mg  total) on Tuesdays and Thursdays, then takes one tablet (2mg  total) on all other days (Patient taking differently: Take 2-4 mg by mouth See admin instructions. Takes ONE tablet (2mg  total) every day except take TWO tablets (4 mg) on Tuesdays and Thursdays) 90 tablet 2   No current facility-administered medications for this visit.    Discontinued Meds:   There are no discontinued medications.  Patient Active Problem List   Diagnosis Date Noted  . Elevated transaminase level 01/18/2015  . Atrial fibrillation with RVR 01/17/2015  . FTT (failure to thrive) in adult 01/17/2015  . Hyperkalemia   . Protein-calorie malnutrition, severe 09/04/2014  . Acute on chronic diastolic CHF (congestive heart failure) 09/03/2014  . Dementia 09/03/2014  . Acute renal failure superimposed on stage 3 chronic kidney disease 09/03/2014  . GERD without esophagitis 09/03/2014  . Anxiety 09/03/2014  . Loss of  weight 09/03/2014  . Physical deconditioning 09/03/2014  . SOB (shortness of breath)   . C. difficile diarrhea 05/11/2014  . C. difficile colitis 04/13/2014  . Chronic respiratory failure with hypoxia 04/13/2014  . Chronic diarrhea 03/05/2014  . Encounter for therapeutic drug monitoring 09/15/2013  . Gait difficulty 05/18/2013  . Chronic diastolic heart failure 02/17/2013  . Chronic renal insufficiency   . Peripheral edema 02/03/2013  . Dysphagia 01/12/2011  . Dilated cardiomyopathy 11/04/2010  . Mitral regurgitation 11/04/2010  . PFO (patent foramen ovale) 11/04/2010  . Chronic anticoagulation 11/04/2010  . Long term (current) use of anticoagulants 10/30/2010  . COMPRESSION FRACTURE, SPINE 10/10/2010  . ATHEROSCLEROSIS OF RENAL ARTERY  09/04/2010  . WEAKNESS 04/30/2010  . HTN (hypertension) 05/27/2009  . Atrial fibrillation 05/27/2009  . SINUS BRADYCARDIA 05/27/2009    LABS    Component Value Date/Time   NA 142 01/19/2015 0623   NA 141 01/18/2015 0300   NA 142 01/17/2015 0820   K 4.5 01/19/2015 0623   K 4.6 01/18/2015 0300   K 5.6* 01/17/2015 0820   CL 107 01/19/2015 0623   CL 105 01/18/2015 0300   CL 104 01/17/2015 0820   CO2 27 01/19/2015 0623   CO2 29 01/18/2015 0300   CO2 24 01/17/2015 0820   GLUCOSE 122* 01/19/2015 0623   GLUCOSE 82 01/18/2015 0300   GLUCOSE 125* 01/17/2015 0820   BUN 54* 01/19/2015 0623   BUN 58* 01/18/2015 0300   BUN 57* 01/17/2015 0820   CREATININE 1.23* 01/19/2015 0623   CREATININE 1.42* 01/18/2015 0300   CREATININE 1.63* 01/17/2015 0820   CREATININE 1.30* 10/22/2014 1230   CREATININE 1.98* 10/12/2014 1144   CREATININE 1.37* 06/05/2014 1254   CALCIUM 8.5* 01/19/2015 0623   CALCIUM 8.8* 01/18/2015 0300   CALCIUM 9.8 01/17/2015 0820   GFRNONAA 39* 01/19/2015 0623   GFRNONAA 33* 01/18/2015 0300   GFRNONAA 28* 01/17/2015 0820   GFRAA 46* 01/19/2015 0623   GFRAA 38* 01/18/2015 0300   GFRAA 33* 01/17/2015 0820   CMP     Component Value Date/Time   NA 142 01/19/2015 0623   K 4.5 01/19/2015 0623   CL 107 01/19/2015 0623   CO2 27 01/19/2015 0623   GLUCOSE 122* 01/19/2015 0623   BUN 54* 01/19/2015 0623   CREATININE 1.23* 01/19/2015 0623   CREATININE 1.30* 10/22/2014 1230   CALCIUM 8.5* 01/19/2015 0623   PROT 5.6* 01/19/2015 0623   ALBUMIN 3.2* 01/19/2015 0623   AST 35 01/19/2015 0623   ALT 43 01/19/2015 0623   ALKPHOS 108 01/19/2015 0623   BILITOT 0.8 01/19/2015 0623   GFRNONAA 39* 01/19/2015 0623   GFRAA 46* 01/19/2015 0623       Component Value Date/Time   WBC 10.5 01/17/2015 0820   WBC 6.9 10/05/2014 1535   WBC 7.2 09/04/2014 0521   HGB 14.4 01/17/2015 0820   HGB 12.4 10/05/2014 1535   HGB 11.5* 09/04/2014 0521   HCT 46.5* 01/17/2015 0820   HCT 39.9  10/05/2014 1535   HCT 36.9 09/04/2014 0521   MCV 82.2 01/17/2015 0820   MCV 81.6 10/05/2014 1535   MCV 81.5 09/04/2014 0521    Lipid Panel  No results found for: CHOL, TRIG, HDL, CHOLHDL, VLDL, LDLCALC, LDLDIRECT  ABG No results found for: PHART, PCO2ART, PO2ART, HCO3, TCO2, ACIDBASEDEF, O2SAT   No results found for: TSH BNP (last 3 results)  Recent Labs  09/03/14 1207 10/05/14 1535  BNP 930.0* 567.0*    ProBNP (last 3 results)  Recent Labs  04/13/14 1611 04/18/14 0519  PROBNP 13309.0* 6636.0*    Cardiac Panel (last 3 results) No results for input(s): CKTOTAL, CKMB, TROPONINI, RELINDX in the last 72 hours.  Iron/TIBC/Ferritin/ %Sat    Component Value Date/Time   IRON 15* 09/04/2014 0521   TIBC 303 09/04/2014 0521   FERRITIN 41 09/04/2014 0521   IRONPCTSAT 5* 09/04/2014 0521     EKG Orders placed or performed during the hospital encounter of 01/17/15  . EKG 12-Lead  . EKG 12-Lead  . EKG     Prior Assessment and Plan Problem List as of 01/22/2015      Cardiovascular and Mediastinum   Mitral regurgitation   Last Assessment & Plan 01/01/2012 Office Visit Written 01/30/2012  3:20 PM by June Leap, MD    No significant mitral regurgitation by physical examination.      PFO (patent foramen ovale)   HTN (hypertension)   Last Assessment & Plan 04/03/2013 Office Visit Written 04/03/2013  2:46 PM by Rande Brunt, PA-C    This has become her most salient issue, and remains quite difficult to manage. Following her last OV, I added HCTZ 12.5 twice a day in an attempt to better control her HTN. However, she developed worsening renal function (peak creatinine 3.3), necessitating complete cessation of Demadex and HCTZ, with subsequent resumption of lisinopril at a lower dose, following improvement in her creatinine level. This may have been due to her known bilateral RAS, followed by Dr. Clifton James, with whom she is scheduled to follow in the next several weeks. In fact, she  is scheduled for a renal artery duplex scan next month. Given this, I recommend she not resume HCTZ, but rather start Norvasc 10 mg daily, and return for BP check here in our clinic later this week.      Atrial fibrillation   Last Assessment & Plan 09/13/2014 Office Visit Written 09/13/2014  4:30 PM by Jodelle Gross, NP    Heart rate is well controlled on diltiazem 30 mg twice a day.  She is also on carvedilol 6.25 mg (one half tablet twice a day).  INR is checked here in the office today and she is on Coumadin.  This was 1.7.  Coumadin dosing is being adjusted.  Her CHADS VASC Score 4.       SINUS BRADYCARDIA   Last Assessment & Plan 04/03/2013 Office Visit Written 04/03/2013  2:59 PM by Rande Brunt, PA-C    I believe that this remains a contributing factor in her complaint of dizziness, fatigue, and gait instability. She has now had documented marked sinus bradycardia on several occasions. She remains on low-dose amiodarone and carvedilol, but in addition to Cardizem, which I decreased in the past for this problem. Therefore, I recommend cutting her Cardizem dose in half for 1 week, then stopping it altogether. She is to continue on low-dose amiodarone, for maintenance of NSR, as well as carvedilol. Will reassess her vitals later this week, at which time we should see an improvement in her resting heart rate.       ATHEROSCLEROSIS OF RENAL ARTERY   Last Assessment & Plan 04/26/2012 Office Visit Written 04/26/2012 10:06 AM by Rande Brunt, PA    Followed by Dr. Clifton James in [place]. Conservative management has been recommended.      Dilated cardiomyopathy   Last Assessment & Plan 04/26/2012 Office Visit Written 04/26/2012 10:05 AM by Rande Brunt, PA    Continue current medication regimen,  although unable to further uptitrate carvedilol, which I substituted for labetalol at time of last OV, secondary to resting bradycardia.      Chronic diastolic heart failure   Last Assessment & Plan  04/03/2013 Office Visit Written 04/03/2013  3:01 PM by Rande Brunt, PA-C    Compensated by history and exam. Do not recommend resuming diuretic therapy, for reasons as outlined above.      Acute on chronic diastolic CHF (congestive heart failure)   Atrial fibrillation with RVR     Respiratory   Chronic respiratory failure with hypoxia     Digestive   Dysphagia   Last Assessment & Plan 01/01/2012 Office Visit Written 01/30/2012  3:19 PM by June Leap, MD    Patient reports symptoms of functional dyspepsia.  I scheduled her for a gastric imaging study.  She could have diabetic gastroparesis.      Chronic diarrhea   Last Assessment & Plan 03/05/2014 Office Visit Written 03/05/2014 10:55 AM by Tiffany Kocher, PA-C    81 y/o female with 6 month h/o chronic diarrhea. Reports unremarkable colonoscopy a couple years ago. No obvious medication changes to explain symptoms. No recent antibiotic use. No travel. Differential diagnosis includes microscopic colitis, high on the list. We will check stool studies, CBC, I FOBT. Low likelihood of mesenteric ischemia without abdominal pain or weight loss. If initial work up negative, she may require repeat colonoscopy with random colon biopsies. Recommended she try imodium 2mg  TID prn for now. Further recommendations to follow.       C. difficile colitis   C. difficile diarrhea   Last Assessment & Plan 05/07/2014 Office Visit Written 05/11/2014 10:44 PM by Nira Retort, NP    Initial onset with positive PCR in Aug 2015 with Flagyl failure, improvement with Vancomycin thereafter. Just complete vanc, with improved frequency of stools but still loose. Need to check Cdiff PCR now. If positive, needs higher dose of vanc with tapering. As of note, last colonoscopy in 2012 at outside facility. If persistent diarrhea but negative Cdiff PCR, consider updated colonoscopy with random colonic biopsies.       GERD without esophagitis     Nervous and Auditory   Dementia      Musculoskeletal and Integument   COMPRESSION FRACTURE, SPINE   Last Assessment & Plan 11/04/2010 Office Visit Written 11/04/2010  4:18 PM by June Leap, MD    Patient will followup in [facility] for consideration of kyphoplasty        Genitourinary   Chronic renal insufficiency   Last Assessment & Plan 09/13/2014 Office Visit Written 09/13/2014  4:31 PM by Jodelle Gross, NP    Follow up labs in one month.  On discharge, the patient's creatinine was 1.50.      Acute renal failure superimposed on stage 3 chronic kidney disease     Other   Chronic anticoagulation   Last Assessment & Plan 01/01/2012 Office Visit Written 01/30/2012  3:20 PM by June Leap, MD    No complications.  No problems with bleeding.  Patient recently tripped and had an MRI done of the back.  No hematoma was noted.      WEAKNESS   Long term (current) use of anticoagulants   Peripheral edema   Last Assessment & Plan 04/03/2013 Office Visit Written 04/03/2013  2:53 PM by Rande Brunt, PA-C    Based on recent evidence of exacerbation of chronic kidney disease, with known bilateral  RAS, I have elected to not resume diuretic treatment for her chronic peripheral edema. Moreover, she has documented evidence of NL LVF and RVF, by recent echocardiography. Therefore, recommend conservative management employing use of compression hose (which she has used in the past), and elevating her legs whenever possible.      Gait difficulty   Encounter for therapeutic drug monitoring   Anxiety   Loss of weight   Physical deconditioning   SOB (shortness of breath)   Protein-calorie malnutrition, severe   FTT (failure to thrive) in adult   Hyperkalemia   Elevated transaminase level       Imaging: Dg Chest Port 1 View  01/17/2015   CLINICAL DATA:  Heart palpitations since yesterday. Shortness of breath and fatigue.  EXAM: PORTABLE CHEST - 1 VIEW  COMPARISON:  Two views of the chest 10/05/2014.  FINDINGS: The heart is  enlarged. Emphysematous changes are again noted. Atherosclerotic calcifications are present at the arch. Moderate pulmonary vascular congestion is noted. Bibasilar airspace disease is present. A left pleural effusion is suspected. The visualized soft tissues and bony thorax are unremarkable.  IMPRESSION: 1. Cardiomegaly and moderate pulmonary vascular congestion suggesting congestive heart failure. 2. Probable left pleural effusion. 3. Mild bibasilar airspace disease, worse on the left. This likely reflects atelectasis.   Electronically Signed   By: Marin Roberts M.D.   On: 01/17/2015 08:59

## 2015-01-22 NOTE — Patient Instructions (Signed)
Your physician recommends that you schedule a follow-up appointment in: 1 month   Your physician has recommended you make the following change in your medication:   TAKE LASIX 20 MG A DAY ALTERNATING WITH LASIX 40 MG A DAY . ON THE DAYS YOU TAKE 40 MG OF LASIX TAKE AN EXTRA POTASSIUM.    Thank you for choosing [facility]!

## 2015-01-22 NOTE — Progress Notes (Signed)
Cardiology Office Note   Date:  01/22/2015   ID:  JORGE AMPARO, DOB [birth year], MRN [id number]  PCP:  Ignatius Specking., MD  Cardiologist:  Purvis Sheffield and/ Joni Reining, NP   Chief Complaint  Patient presents with  . Atrial Fibrillation  . Congestive Heart Failure    Diastolic  . Hypertension      History of Present Illness: [name] is a 81 y.o. female who presents for assessment and management of chronic atrial fibrillation, with a history of diastolic heart failure, and hypertension in the context of chronic kidney disease.  The patient was last seen in the office by Dr. Purvis Sheffield on 12/27/2014 for followup after rapid atrial fibrillation, with a heart rate of 122 beats per minute.  Her diltiazem and had been increased to 60 mg twice a day at that time.  On the office visit, she was feeling better and denying palpitations.  She was due to followup in 4 months.  She comes today with complaints of generalized weakness and LEE. Her family who is with her state that she is eating soup and crackers at home. She doesn't really have an appetite though. Things do not taste good to her.  She has gained 5 lbs since being discharged. Her breathing status is unchanged. She denies palpitations.    Past Medical History  Diagnosis Date  . Sinoatrial node dysfunction      SINUS BRADYCARDIA  . Hypertension     unspecified  . Atrial fibrillation   . Encounter for long-term (current) use of other medications     COUMADIN THERAPY  . Lower extremity weakness   . Sinus bradycardia   . Coronary artery disease   . Dementia   . Bilateral renal artery stenosis     MODERATE by dopplers August 2012.  Marland Kitchen Chronic renal insufficiency     Past Surgical History  Procedure Laterality Date  . Knee arthroscopy    . Cardioversion      x2  . Colonoscopy  05/2011    Dr. Allena Katz: normal. no bx.  . Cholecystectomy    . Back surgery       Current Outpatient Prescriptions  Medication Sig  Dispense Refill  . ALPRAZolam (XANAX) 0.5 MG tablet Take 0.5 mg by mouth at bedtime as needed for anxiety or sleep.     . Biotin 5000 MCG TABS Take 1 tablet by mouth daily.     . Calcium Carbonate-Vitamin D (CALTRATE 600+D PO) Take 1 tablet by mouth 2 (two) times daily.     . carvedilol (COREG) 6.25 MG tablet Take 1 tablet (6.25 mg total) by mouth 2 (two) times daily.    Marland Kitchen diltiazem (CARDIZEM) 90 MG tablet Take 1 tablet (90 mg total) by mouth 2 (two) times daily. 60 tablet 0  . donepezil (ARICEPT) 10 MG tablet Take 1 tablet by mouth at bedtime.  6  . feeding supplement, ENSURE COMPLETE, (ENSURE COMPLETE) LIQD Take 237 mLs by mouth 3 (three) times daily between meals. 90 Bottle 1  . furosemide (LASIX) 20 MG tablet Take 1 tablet (20 mg total) by mouth daily.    Marland Kitchen HYDROcodone-acetaminophen (NORCO/VICODIN) 5-325 MG per tablet Take 1 tablet by mouth every 8 (eight) hours as needed for moderate pain.    . memantine (NAMENDA) 10 MG tablet Take 1 tablet by mouth 2 (two) times daily.  3  . Multiple Vitamin (MULTIVITAMIN) tablet Take 1 tablet by mouth daily.     . potassium chloride (K-DUR) 10 MEQ  tablet Take 1 tablet (10 mEq total) by mouth daily. 30 tablet 0  . warfarin (COUMADIN) 2 MG tablet Take 0.5-1 tablets (1-2 mg total) by mouth every evening. Takes one half tablet (1mg  total) on Tuesdays and Thursdays, then takes one tablet (2mg  total) on all other days (Patient taking differently: Take 2-4 mg by mouth See admin instructions. Takes ONE tablet (2mg  total) every day except take TWO tablets (4 mg) on Tuesdays and Thursdays) 90 tablet 2   No current facility-administered medications for this visit.    Allergies:   Review of patient's allergies indicates no known allergies.    Social History:  The patient  reports that she has never smoked. She has never used smokeless tobacco. She reports that she does not drink alcohol or use illicit drugs.   Family History:  The patient's family history includes  Cancer in her brother; Cirrhosis in her other; Heart failure in her mother; High blood pressure in her mother.    ROS: .   All other systems are reviewed and negative.Unless otherwise mentioned in  H&P above.   PHYSICAL EXAM: VS:  BP 128/68 mmHg  Pulse 89  Ht 5\' 5"  (1.651 m)  Wt 121 lb (54.885 kg)  BMI 20.14 kg/m2  SpO2 98% , BMI Body mass index is 20.14 kg/(m^2). GEN: Well nourished, well developed, in no acute distress HEENT: normal Neck: no JVD, carotid bruits, or masses Cardiac: RRR; no murmurs, rubs, or gallops,no edema  Respiratory:  clear to auscultation bilaterally, normal work of breathing GI: soft, nontender, nondistended, + BS MS: no deformity or atrophy2+ pitting edema on the left, 1+ pitting edema on the right. Venous stasis skin changes.  Skin: warm and dry, no rash Neuro:  Strength is diminished. and sensation are intact Psych: euthymic mood, flat affect   Recent Labs: 04/18/2014: Pro B Natriuretic peptide (BNP) 6636.0* 09/03/2014: Magnesium 2.3 10/05/2014: B Natriuretic Peptide 567.0* 01/17/2015: Hemoglobin 14.4; Platelets 216 01/19/2015: ALT 43; BUN 54*; Creatinine, Ser 1.23*; Potassium 4.5; Sodium 142    Lipid Panel No results found for: CHOL, TRIG, HDL, CHOLHDL, VLDL, LDLCALC, LDLDIRECT    Wt Readings from Last 3 Encounters:  01/22/15 121 lb (54.885 kg)  01/18/15 115 lb 15.4 oz (52.6 kg)  12/27/14 116 lb (52.617 kg)      Other studies Reviewed: Additional studies/ records that were reviewed today include: None Review of the above records demonstrates: None   ASSESSMENT AND PLAN:  1. Atrial fib: Rate controlled currently She is being followed in the [place] office for dosing. HR is well controlled. She has some LEE probably associated with CCB use and dependent leg position, as well as eating salty foods. I have suggested taking Lasix 40 mg one day and 20 mg the other day and repeat. She is to avoid salty foods. Will follow up with BMET in a week. See her in  a month.  2. Systolic CHF: Related to rapid atrial fib. Hear rate is controlled. Continue regimen as outlined. Will see her on one month with BMET.    Current medicines are reviewed at length with the patient today.    Labs/ tests ordered today include: BMET    Disposition:   FU with  One month  Signed, Joni Reining, NP  01/22/2015 2:53 PM    [facility] [address] [place], [place] [address] Phone: [phone]; Fax: [phone]

## 2015-01-24 ENCOUNTER — Ambulatory Visit (INDEPENDENT_AMBULATORY_CARE_PROVIDER_SITE_OTHER): Payer: Medicare Other | Admitting: *Deleted

## 2015-01-24 DIAGNOSIS — I4891 Unspecified atrial fibrillation: Secondary | ICD-10-CM | POA: Diagnosis not present

## 2015-01-24 DIAGNOSIS — Z7901 Long term (current) use of anticoagulants: Secondary | ICD-10-CM

## 2015-01-24 DIAGNOSIS — Z5181 Encounter for therapeutic drug level monitoring: Secondary | ICD-10-CM

## 2015-01-24 LAB — POCT INR: INR: 1.9

## 2015-02-01 NOTE — Progress Notes (Signed)
I can not close this 

## 2015-02-21 ENCOUNTER — Encounter: Payer: Self-pay | Admitting: Cardiovascular Disease

## 2015-02-21 ENCOUNTER — Ambulatory Visit (INDEPENDENT_AMBULATORY_CARE_PROVIDER_SITE_OTHER): Payer: Medicare Other | Admitting: Cardiovascular Disease

## 2015-02-21 ENCOUNTER — Ambulatory Visit (INDEPENDENT_AMBULATORY_CARE_PROVIDER_SITE_OTHER): Payer: Medicare Other | Admitting: *Deleted

## 2015-02-21 VITALS — BP 108/78 | HR 82 | Ht 64.0 in | Wt 119.0 lb

## 2015-02-21 DIAGNOSIS — I10 Essential (primary) hypertension: Secondary | ICD-10-CM | POA: Diagnosis not present

## 2015-02-21 DIAGNOSIS — I482 Chronic atrial fibrillation, unspecified: Secondary | ICD-10-CM

## 2015-02-21 DIAGNOSIS — I5032 Chronic diastolic (congestive) heart failure: Secondary | ICD-10-CM

## 2015-02-21 DIAGNOSIS — Z5181 Encounter for therapeutic drug level monitoring: Secondary | ICD-10-CM

## 2015-02-21 DIAGNOSIS — I701 Atherosclerosis of renal artery: Secondary | ICD-10-CM

## 2015-02-21 DIAGNOSIS — Z7901 Long term (current) use of anticoagulants: Secondary | ICD-10-CM

## 2015-02-21 DIAGNOSIS — N189 Chronic kidney disease, unspecified: Secondary | ICD-10-CM | POA: Diagnosis not present

## 2015-02-21 DIAGNOSIS — I4891 Unspecified atrial fibrillation: Secondary | ICD-10-CM | POA: Diagnosis not present

## 2015-02-21 LAB — POCT INR: INR: 1.9

## 2015-02-21 NOTE — Progress Notes (Signed)
Patient ID: [name], female   DOB: January 09, 1929, 81 y.o.   MRN: [id number]      SUBJECTIVE: The patient presents for follow-up of atrial fibrillation, chronic diastolic heart failure, and hypertension in the context of chronic kidney disease  She was hospitalized in June for rapid atrial fibrillation with consequent acute on chronic diastolic congestive heart failure.  Carvedilol was increased to 6.25 mg twice daily and diltiazem was increased to 90 mg twice daily.  She now takes Lasix 40 mg and 20 mg on alternate days.  She is feeling very well and denies chest pain, palpitations, leg swelling, and shortness of breath.  Review of Systems: As per "subjective", otherwise negative.  No Known Allergies  Current Outpatient Prescriptions  Medication Sig Dispense Refill  . ALPRAZolam (XANAX) 0.5 MG tablet Take 0.5 mg by mouth at bedtime as needed for anxiety or sleep.     . Biotin 5000 MCG TABS Take 1 tablet by mouth daily.     . Calcium Carbonate-Vitamin D (CALTRATE 600+D PO) Take 1 tablet by mouth 2 (two) times daily.     . carvedilol (COREG) 6.25 MG tablet Take 1 tablet (6.25 mg total) by mouth 2 (two) times daily.    Marland Kitchen diltiazem (CARDIZEM) 90 MG tablet Take 1 tablet (90 mg total) by mouth 2 (two) times daily. 60 tablet 0  . donepezil (ARICEPT) 10 MG tablet Take 1 tablet by mouth at bedtime.  6  . feeding supplement, ENSURE COMPLETE, (ENSURE COMPLETE) LIQD Take 237 mLs by mouth 3 (three) times daily between meals. 90 Bottle 1  . furosemide (LASIX) 20 MG tablet TAKE LASIX 20 MG A DAY ALTERNATING WITH LASIX 40 MG A DAY 45 tablet 6  . HYDROcodone-acetaminophen (NORCO/VICODIN) 5-325 MG per tablet Take 1 tablet by mouth every 8 (eight) hours as needed for moderate pain.    . memantine (NAMENDA) 10 MG tablet Take 1 tablet by mouth 2 (two) times daily.  3  . MIRTAZAPINE PO Take by mouth. nightly    . Multiple Vitamin (MULTIVITAMIN) tablet Take 1 tablet by mouth daily.     . potassium  chloride (K-DUR) 10 MEQ tablet Take 1 tablet (10 mEq total) by mouth daily. 30 tablet 0  . warfarin (COUMADIN) 2 MG tablet Take 0.5-1 tablets (1-2 mg total) by mouth every evening. Takes one half tablet (1mg  total) on Tuesdays and Thursdays, then takes one tablet (2mg  total) on all other days (Patient taking differently: Take 2-4 mg by mouth See admin instructions. Takes ONE tablet (2mg  total) every day except take TWO tablets (4 mg) on Tuesdays and Thursdays) 90 tablet 2   No current facility-administered medications for this visit.    Past Medical History  Diagnosis Date  . Sinoatrial node dysfunction      SINUS BRADYCARDIA  . Hypertension     unspecified  . Atrial fibrillation   . Encounter for long-term (current) use of other medications     COUMADIN THERAPY  . Lower extremity weakness   . Sinus bradycardia   . Coronary artery disease   . Dementia   . Bilateral renal artery stenosis     MODERATE by dopplers August 2012.  Marland Kitchen Chronic renal insufficiency     Past Surgical History  Procedure Laterality Date  . Knee arthroscopy    . Cardioversion      x2  . Colonoscopy  05/2011    Dr. Allena Katz: normal. no bx.  . Cholecystectomy    . Back surgery  History   Social History  . Marital Status: Married    Spouse Name: [name]  . Number of Children: 0  . Years of Education: 12   Occupational History  . RETIRED   .     Social History Main Topics  . Smoking status: Never Smoker   . Smokeless tobacco: Never Used     Comment: Never smoked  . Alcohol Use: No  . Drug Use: No  . Sexual Activity: Not Currently   Other Topics Concern  . Not on file   Social History Narrative   Patient lives at home with her husband [name]) Patient is retired. Patient has 12 th grade education.   Right handed.   Caffeine- two three cups of coffee daily.     Filed Vitals:   02/21/15 1446  BP: 108/78  Pulse: 82  Height:  (1.626 m)  Weight: 119 lb (53.978 kg)  SpO2: 97%     PHYSICAL EXAM General: NAD  Neck: No JVD, no thyromegaly.  Lungs: Clear to auscultation bilaterally with normal respiratory effort.  CV: Normal rate, irregular rhythm, normal S1/S2, no S3, II/VI pansystolic murmur along left sternal border. No pedal edema. Venous varicosities b/l. Abdomen: Soft, nontender, no distention.  Neurologic: Alert and oriented.  Psych: Normal affect.  Skin: Normal. Musculoskeletal: No gross deformities. Extremities: No clubbing or cyanosis.   ECG: Most recent ECG reviewed.      ASSESSMENT AND PLAN:  Essential hypertension Normal today. She has unilateral renal artery stenosis. Previously d/c prazosin. Will closely monitor given her propensity for high BP.   Peripheral edema  She has had exacerbations of chronic kidney disease in the past, and has right renal artery stenosis. Weight is stable at 119 lbs.  Taking Lasix 40 mg and 20 mg on alternate days. Encouraged to avoid sodium (used to eat potato chips daily).  Chronic renal insufficiency/Right renal artery stenosis  Repeat renal Dopplers in 1 year.  Obtain BMET.  Atrial fibrillation  HR controlled and symptomatically stable with increase of diltiazem to 90 mg bid and Coreg to 6.25 mg bid. On Coumadin for anticoagulation, followed here in our clinic. No changes.  Chronic diastolic heart failure  Weight is 119 lbs. Taking Lasix 40 mg and 20 mg on alternate days. Encouraged to avoid sodium (used to eat potato chips daily).   Dispo: f/u 3 months.  Time spent: 40 minutes, of which greater than 50% was spent reviewing symptoms, relevant blood tests and studies, and discussing management plan with the patient.   Prentice Docker, M.D., F.A.C.C.

## 2015-02-21 NOTE — Patient Instructions (Signed)
Continue all current medications. Lab for [facility] will contact with results via phone or letter.   Follow up in  3 months

## 2015-02-22 ENCOUNTER — Other Ambulatory Visit: Payer: Self-pay | Admitting: *Deleted

## 2015-02-22 MED ORDER — FUROSEMIDE 20 MG PO TABS: ORAL_TABLET | ORAL | Status: DC

## 2015-02-26 ENCOUNTER — Other Ambulatory Visit: Payer: Self-pay | Admitting: *Deleted

## 2015-02-26 MED ORDER — DILTIAZEM HCL 90 MG PO TABS: 90.0000 mg | ORAL_TABLET | Freq: Two times a day (BID) | ORAL | Status: DC

## 2015-02-26 NOTE — Telephone Encounter (Signed)
Diltiazem refilled today.

## 2015-03-05 ENCOUNTER — Telehealth: Payer: Self-pay | Admitting: *Deleted

## 2015-03-05 NOTE — Telephone Encounter (Signed)
Will forward as FYI.  °

## 2015-03-05 NOTE — Telephone Encounter (Signed)
Pt does not have swelling/weight gain. Pt caregiver will call back if symptoms worsen, caregiver was unable to take manual HR. Explained accuracy of automatic BP cuff. Caregiver will call back if symptoms worsen but says pt was feeling better to get mammogram today. Will call back if symptoms worsen

## 2015-03-05 NOTE — Telephone Encounter (Signed)
Wouldn't necessarily trust automated BP cuff for HR reading in atrial fibrillation, as it is often incorrect. Could have her see PCP. Has weight increased? More leg edema?

## 2015-03-05 NOTE — Telephone Encounter (Signed)
Pt caregiver called to report HR jumping from 40s-80s in the last 2 days with today being the lowest at 41, denies CP/SOB, just says she doesn't feel good. Will forward to Dr. Purvis Sheffield

## 2015-03-11 ENCOUNTER — Other Ambulatory Visit: Payer: Self-pay | Admitting: *Deleted

## 2015-03-11 MED ORDER — WARFARIN SODIUM 2 MG PO TABS: ORAL_TABLET | ORAL | Status: DC

## 2015-03-14 ENCOUNTER — Ambulatory Visit (INDEPENDENT_AMBULATORY_CARE_PROVIDER_SITE_OTHER): Payer: Medicare Other | Admitting: *Deleted

## 2015-03-14 DIAGNOSIS — Z7901 Long term (current) use of anticoagulants: Secondary | ICD-10-CM | POA: Diagnosis not present

## 2015-03-14 DIAGNOSIS — I4891 Unspecified atrial fibrillation: Secondary | ICD-10-CM | POA: Diagnosis not present

## 2015-03-14 DIAGNOSIS — Z5181 Encounter for therapeutic drug level monitoring: Secondary | ICD-10-CM

## 2015-03-14 LAB — POCT INR: INR: 2.1

## 2015-04-09 ENCOUNTER — Encounter: Payer: Self-pay | Admitting: *Deleted

## 2015-04-12 ENCOUNTER — Ambulatory Visit: Payer: Medicare Other | Admitting: Cardiovascular Disease

## 2015-04-18 ENCOUNTER — Ambulatory Visit (INDEPENDENT_AMBULATORY_CARE_PROVIDER_SITE_OTHER): Payer: Medicare Other | Admitting: *Deleted

## 2015-04-18 DIAGNOSIS — Z5181 Encounter for therapeutic drug level monitoring: Secondary | ICD-10-CM | POA: Diagnosis not present

## 2015-04-18 DIAGNOSIS — I4891 Unspecified atrial fibrillation: Secondary | ICD-10-CM

## 2015-04-18 DIAGNOSIS — Z7901 Long term (current) use of anticoagulants: Secondary | ICD-10-CM

## 2015-04-18 LAB — POCT INR: INR: 2.4

## 2015-05-16 ENCOUNTER — Ambulatory Visit (INDEPENDENT_AMBULATORY_CARE_PROVIDER_SITE_OTHER): Payer: Medicare Other | Admitting: *Deleted

## 2015-05-16 DIAGNOSIS — I4891 Unspecified atrial fibrillation: Secondary | ICD-10-CM | POA: Diagnosis not present

## 2015-05-16 DIAGNOSIS — Z7901 Long term (current) use of anticoagulants: Secondary | ICD-10-CM | POA: Diagnosis not present

## 2015-05-16 DIAGNOSIS — Z5181 Encounter for therapeutic drug level monitoring: Secondary | ICD-10-CM

## 2015-05-16 LAB — POCT INR: INR: 2.5

## 2015-06-06 ENCOUNTER — Other Ambulatory Visit: Payer: Self-pay | Admitting: *Deleted

## 2015-06-06 MED ORDER — WARFARIN SODIUM 2 MG PO TABS: ORAL_TABLET | ORAL | Status: DC

## 2015-06-13 ENCOUNTER — Ambulatory Visit (INDEPENDENT_AMBULATORY_CARE_PROVIDER_SITE_OTHER): Payer: Medicare Other | Admitting: *Deleted

## 2015-06-13 DIAGNOSIS — Z7901 Long term (current) use of anticoagulants: Secondary | ICD-10-CM

## 2015-06-13 DIAGNOSIS — I4891 Unspecified atrial fibrillation: Secondary | ICD-10-CM | POA: Diagnosis not present

## 2015-06-13 DIAGNOSIS — Z5181 Encounter for therapeutic drug level monitoring: Secondary | ICD-10-CM | POA: Diagnosis not present

## 2015-06-13 LAB — POCT INR: INR: 2.3

## 2015-06-18 ENCOUNTER — Encounter: Payer: Self-pay | Admitting: Cardiovascular Disease

## 2015-06-18 ENCOUNTER — Ambulatory Visit (INDEPENDENT_AMBULATORY_CARE_PROVIDER_SITE_OTHER): Payer: Medicare Other | Admitting: Cardiovascular Disease

## 2015-06-18 VITALS — BP 160/89 | HR 60 | Ht 62.0 in | Wt 122.0 lb

## 2015-06-18 DIAGNOSIS — Z7901 Long term (current) use of anticoagulants: Secondary | ICD-10-CM | POA: Diagnosis not present

## 2015-06-18 DIAGNOSIS — I482 Chronic atrial fibrillation: Secondary | ICD-10-CM | POA: Diagnosis not present

## 2015-06-18 DIAGNOSIS — N189 Chronic kidney disease, unspecified: Secondary | ICD-10-CM

## 2015-06-18 DIAGNOSIS — I5032 Chronic diastolic (congestive) heart failure: Secondary | ICD-10-CM

## 2015-06-18 DIAGNOSIS — I10 Essential (primary) hypertension: Secondary | ICD-10-CM

## 2015-06-18 DIAGNOSIS — R609 Edema, unspecified: Secondary | ICD-10-CM

## 2015-06-18 DIAGNOSIS — I701 Atherosclerosis of renal artery: Secondary | ICD-10-CM | POA: Diagnosis not present

## 2015-06-18 DIAGNOSIS — I4821 Permanent atrial fibrillation: Secondary | ICD-10-CM

## 2015-06-18 MED ORDER — POTASSIUM CHLORIDE ER 10 MEQ PO TBCR: 10.0000 meq | EXTENDED_RELEASE_TABLET | Freq: Every day | ORAL | Status: AC

## 2015-06-18 MED ORDER — CARVEDILOL 6.25 MG PO TABS: 6.2500 mg | ORAL_TABLET | Freq: Two times a day (BID) | ORAL | Status: DC

## 2015-06-18 MED ORDER — DILTIAZEM HCL 90 MG PO TABS: 90.0000 mg | ORAL_TABLET | Freq: Two times a day (BID) | ORAL | Status: DC

## 2015-06-18 MED ORDER — FUROSEMIDE 20 MG PO TABS: ORAL_TABLET | ORAL | Status: DC

## 2015-06-18 MED ORDER — CARVEDILOL 12.5 MG PO TABS: 12.5000 mg | ORAL_TABLET | Freq: Two times a day (BID) | ORAL | Status: DC

## 2015-06-18 NOTE — Progress Notes (Signed)
Patient ID: [name], female   DOB: Dec 15, 1928, 81 y.o.   MRN: [id number]      SUBJECTIVE: The patient presents for follow-up of atrial fibrillation, chronic diastolic heart failure, and hypertension in the context of chronic kidney disease.  She was hospitalized in June 2016 for rapid atrial fibrillation with consequent acute on chronic diastolic congestive heart failure.  Carvedilol was increased to 6.25 mg twice daily and diltiazem was increased to 90 mg twice daily.  She takes Lasix 40 mg and 20 mg on alternate days.  She is feeling very well and denies chest pain, palpitations, leg swelling, and shortness of breath.  Her BP has been elevated into the 160 mmHg systolic range over the past 1-2 weeks. When it gets to 160, she doesn't feel so well.  Review of Systems: As per "subjective", otherwise negative.  No Known Allergies  Current Outpatient Prescriptions  Medication Sig Dispense Refill  . ALPRAZolam (XANAX) 0.5 MG tablet Take 0.5 mg by mouth at bedtime as needed for anxiety or sleep.     . Biotin 5000 MCG TABS Take 1 tablet by mouth daily. 10,000 mcg daily    . Calcium Carbonate-Vitamin D (CALTRATE 600+D PO) Take 1 tablet by mouth 2 (two) times daily.     . carvedilol (COREG) 6.25 MG tablet Take 1 tablet (6.25 mg total) by mouth 2 (two) times daily.    Marland Kitchen diltiazem (CARDIZEM) 90 MG tablet Take 1 tablet (90 mg total) by mouth 2 (two) times daily. 60 tablet 6  . donepezil (ARICEPT) 10 MG tablet Take 1 tablet by mouth at bedtime.  6  . feeding supplement, ENSURE COMPLETE, (ENSURE COMPLETE) LIQD Take 237 mLs by mouth 3 (three) times daily between meals. 90 Bottle 1  . furosemide (LASIX) 20 MG tablet TAKE LASIX 20 MG A DAY ALTERNATING WITH LASIX 40 MG A DAY 45 tablet 6  . HYDROcodone-acetaminophen (NORCO/VICODIN) 5-325 MG per tablet Take 1 tablet by mouth every 8 (eight) hours as needed for moderate pain.    . memantine (NAMENDA) 10 MG tablet Take 1 tablet by mouth 2 (two)  times daily.  3  . MIRTAZAPINE PO Take by mouth. nightly    . Multiple Vitamin (MULTIVITAMIN) tablet Take 1 tablet by mouth daily.     . potassium chloride (K-DUR) 10 MEQ tablet Take 1 tablet (10 mEq total) by mouth daily. 30 tablet 0  . warfarin (COUMADIN) 2 MG tablet Take 2 tablets by mouth Tuesday, Thursday and Saturday. Take 1 tablet all other days 45 tablet 2   No current facility-administered medications for this visit.    Past Medical History  Diagnosis Date  . Sinoatrial node dysfunction (HCC)      SINUS BRADYCARDIA  . Hypertension     unspecified  . Atrial fibrillation (HCC)   . Encounter for long-term (current) use of other medications     COUMADIN THERAPY  . Lower extremity weakness   . Sinus bradycardia   . Coronary artery disease   . Dementia   . Bilateral renal artery stenosis (HCC)     MODERATE by dopplers August 2012.  Marland Kitchen Chronic renal insufficiency     Past Surgical History  Procedure Laterality Date  . Knee arthroscopy    . Cardioversion      x2  . Colonoscopy  05/2011    Dr. Allena Katz: normal. no bx.  . Cholecystectomy    . Back surgery      Social History   Social History  .  Marital Status: Married    Spouse Name: [name]  . Number of Children: 0  . Years of Education: 12   Occupational History  . RETIRED   .     Social History Main Topics  . Smoking status: Never Smoker   . Smokeless tobacco: Never Used     Comment: Never smoked  . Alcohol Use: No  . Drug Use: No  . Sexual Activity: Not Currently   Other Topics Concern  . Not on file   Social History Narrative   Patient lives at home with her husband [name]) Patient is retired. Patient has 12 th grade education.   Right handed.   Caffeine- two three cups of coffee daily.     Filed Vitals:   06/18/15 1416  BP: 160/89  Pulse: 60  Height:  (1.575 m)  Weight: 122 lb (55.339 kg)  SpO2: 98%   HR by auscultation: 84 bpm  PHYSICAL EXAM General: NAD  Neck: No JVD, no  thyromegaly.  Lungs: Clear to auscultation bilaterally with normal respiratory effort.  CV: Normal rate, irregular rhythm, normal S1/S2, no S3, II/VI pansystolic murmur along left sternal border. No pedal edema. Venous varicosities b/l. Abdomen: Soft, nontender, no distention.  Neurologic: Alert and oriented.  Psych: Normal affect.  Skin: Normal. Musculoskeletal: No gross deformities. Extremities: No clubbing or cyanosis.   ECG: Most recent ECG reviewed.      ASSESSMENT AND PLAN:  Essential hypertension Elevated. She has unilateral renal artery stenosis. Previously d/c prazosin. Will increase Coreg to 12.5 mg bid to avoid diastolic decompensation.  Peripheral edema  She has had exacerbations of chronic kidney disease in the past, and has right renal artery stenosis. Weight is stable at 122 lbs. Aim to control BP by increasing Coreg. Taking Lasix 40 mg and 20 mg on alternate days. Encouraged to avoid sodium (used to eat potato chips daily).  Chronic renal insufficiency/Right renal artery stenosis  Repeat renal Dopplers in 2017.  Atrial fibrillation  HR controlled and symptomatically stable with increase of diltiazem to 90 mg bid and Coreg to 6.25 mg bid. Monitor HR given increase in Coreg today. On warfarin for anticoagulation, followed here in our clinic.  Chronic diastolic heart failure  Weight is 122 lbs. Taking Lasix 40 mg and 20 mg on alternate days. Encouraged to avoid sodium (used to eat potato chips daily).   Dispo: f/u 6 months.   Prentice Docker, M.D., F.A.C.C.

## 2015-06-18 NOTE — Patient Instructions (Signed)
   Increase Coreg to 12.5mg  twice a day  - new sent to pharmacy today. Continue all other medications.   Your physician wants you to follow up in: 6 months.  You will receive a reminder letter in the mail one-two months in advance.  If you don't receive a letter, please call our office to schedule the follow up appointment

## 2015-06-18 NOTE — Addendum Note (Signed)
Addended by: Lesle Chris on: 06/18/2015 02:54 PM   Modules accepted: Orders

## 2015-06-27 ENCOUNTER — Telehealth: Payer: Self-pay | Admitting: *Deleted

## 2015-06-27 DIAGNOSIS — I4821 Permanent atrial fibrillation: Secondary | ICD-10-CM

## 2015-06-27 DIAGNOSIS — I10 Essential (primary) hypertension: Secondary | ICD-10-CM

## 2015-06-27 DIAGNOSIS — N189 Chronic kidney disease, unspecified: Secondary | ICD-10-CM

## 2015-06-27 DIAGNOSIS — R609 Edema, unspecified: Secondary | ICD-10-CM

## 2015-06-27 DIAGNOSIS — R600 Localized edema: Secondary | ICD-10-CM

## 2015-06-27 DIAGNOSIS — I701 Atherosclerosis of renal artery: Secondary | ICD-10-CM

## 2015-06-27 DIAGNOSIS — Z7901 Long term (current) use of anticoagulants: Secondary | ICD-10-CM

## 2015-06-27 DIAGNOSIS — I5032 Chronic diastolic (congestive) heart failure: Secondary | ICD-10-CM

## 2015-06-27 MED ORDER — CARVEDILOL 12.5 MG PO TABS: 12.5000 mg | ORAL_TABLET | Freq: Two times a day (BID) | ORAL | Status: DC

## 2015-06-27 MED ORDER — CARVEDILOL 25 MG PO TABS: 25.0000 mg | ORAL_TABLET | Freq: Two times a day (BID) | ORAL | Status: DC

## 2015-06-27 NOTE — Telephone Encounter (Signed)
Increase Coreg to 25 mg bid.

## 2015-06-27 NOTE — Telephone Encounter (Signed)
Pt caregiver made aware. Medication sent to pharmacy.

## 2015-06-27 NOTE — Telephone Encounter (Signed)
Pt caregiver doesn't want to increase Coreg due to BP fluctuating down 90/52 today. Will cx new rx. Pt denied and worsening symptoms and BP was WNL as of right now. Pt will report to ED if symptoms worsen.

## 2015-06-27 NOTE — Addendum Note (Signed)
Addended by: Burman Nieves T on: 06/27/2015 04:12 PM   Modules accepted: Orders, Medications

## 2015-06-27 NOTE — Telephone Encounter (Signed)
Pt caregiver calling to report last night 177/84 BP and HR 86 and this AM 144/100 BP HR 90, some SOB, denies CP/dizziness. Will forward to Dr. Purvis Sheffield

## 2015-06-28 ENCOUNTER — Encounter (HOSPITAL_COMMUNITY): Payer: Self-pay

## 2015-06-28 ENCOUNTER — Emergency Department (HOSPITAL_COMMUNITY): Payer: Medicare Other

## 2015-06-28 ENCOUNTER — Emergency Department (HOSPITAL_COMMUNITY)
Admission: EM | Admit: 2015-06-28 | Discharge: 2015-06-28 | Disposition: A | Discharge status: Home / Self Care | Payer: Medicare Other | Attending: Emergency Medicine | Admitting: Emergency Medicine

## 2015-06-28 DIAGNOSIS — I2510 Atherosclerotic heart disease of native coronary artery without angina pectoris: Secondary | ICD-10-CM | POA: Diagnosis not present

## 2015-06-28 DIAGNOSIS — Z7901 Long term (current) use of anticoagulants: Secondary | ICD-10-CM | POA: Diagnosis not present

## 2015-06-28 DIAGNOSIS — R531 Weakness: Secondary | ICD-10-CM | POA: Insufficient documentation

## 2015-06-28 DIAGNOSIS — Z79899 Other long term (current) drug therapy: Secondary | ICD-10-CM | POA: Diagnosis not present

## 2015-06-28 DIAGNOSIS — T447X5A Adverse effect of beta-adrenoreceptor antagonists, initial encounter: Secondary | ICD-10-CM | POA: Insufficient documentation

## 2015-06-28 DIAGNOSIS — N189 Chronic kidney disease, unspecified: Secondary | ICD-10-CM | POA: Diagnosis not present

## 2015-06-28 DIAGNOSIS — F0390 Unspecified dementia without behavioral disturbance: Secondary | ICD-10-CM | POA: Insufficient documentation

## 2015-06-28 DIAGNOSIS — I129 Hypertensive chronic kidney disease with stage 1 through stage 4 chronic kidney disease, or unspecified chronic kidney disease: Secondary | ICD-10-CM | POA: Insufficient documentation

## 2015-06-28 DIAGNOSIS — I482 Chronic atrial fibrillation, unspecified: Secondary | ICD-10-CM

## 2015-06-28 DIAGNOSIS — R008 Other abnormalities of heart beat: Secondary | ICD-10-CM | POA: Diagnosis present

## 2015-06-28 DIAGNOSIS — T50905A Adverse effect of unspecified drugs, medicaments and biological substances, initial encounter: Secondary | ICD-10-CM

## 2015-06-28 LAB — CBC WITH DIFFERENTIAL/PLATELET
Basophils Absolute: 0.1 10*3/uL (ref 0.0–0.1)
Basophils Relative: 1 %
Eosinophils Absolute: 0.1 10*3/uL (ref 0.0–0.7)
Eosinophils Relative: 2 %
HCT: 48.3 % — ABNORMAL HIGH (ref 36.0–46.0)
HEMOGLOBIN: 15.5 g/dL — AB (ref 12.0–15.0)
LYMPHS ABS: 0.9 10*3/uL (ref 0.7–4.0)
LYMPHS PCT: 11 %
MCH: 28.6 pg (ref 26.0–34.0)
MCHC: 32.1 g/dL (ref 30.0–36.0)
MCV: 89.1 fL (ref 78.0–100.0)
Monocytes Absolute: 0.7 10*3/uL (ref 0.1–1.0)
Monocytes Relative: 9 %
NEUTROS PCT: 77 %
Neutro Abs: 6.2 10*3/uL (ref 1.7–7.7)
Platelets: 196 10*3/uL (ref 150–400)
RBC: 5.42 MIL/uL — AB (ref 3.87–5.11)
RDW: 21 % — ABNORMAL HIGH (ref 11.5–15.5)
WBC: 8 10*3/uL (ref 4.0–10.5)

## 2015-06-28 LAB — TROPONIN I: Troponin I: <0.03 ng/mL (ref <0.031)

## 2015-06-28 LAB — URINALYSIS, ROUTINE W REFLEX MICROSCOPIC
BILIRUBIN URINE: NEGATIVE
Glucose, UA: NEGATIVE mg/dL
Hgb urine dipstick: NEGATIVE
Ketones, ur: NEGATIVE mg/dL
LEUKOCYTES UA: NEGATIVE
NITRITE: NEGATIVE
PH: 5.5 (ref 5.0–8.0)
Protein, ur: NEGATIVE mg/dL
SPECIFIC GRAVITY, URINE: 1.015 (ref 1.005–1.030)

## 2015-06-28 LAB — BASIC METABOLIC PANEL
Anion gap: 5 (ref 5–15)
BUN: 37 mg/dL — AB (ref 6–20)
CHLORIDE: 104 mmol/L (ref 101–111)
CO2: 31 mmol/L (ref 22–32)
CREATININE: 1.18 mg/dL — AB (ref 0.44–1.00)
Calcium: 9.8 mg/dL (ref 8.9–10.3)
GFR calc Af Amer: 48 mL/min — ABNORMAL LOW (ref >60)
GFR calc non Af Amer: 41 mL/min — ABNORMAL LOW (ref >60)
GLUCOSE: 194 mg/dL — AB (ref 65–99)
POTASSIUM: 4.1 mmol/L (ref 3.5–5.1)
Sodium: 140 mmol/L (ref 135–145)

## 2015-06-28 LAB — PROTIME-INR
INR: 2.35 — ABNORMAL HIGH (ref 0.00–1.49)
Prothrombin Time: 25.5 seconds — ABNORMAL HIGH (ref 11.6–15.2)

## 2015-06-28 LAB — BRAIN NATRIURETIC PEPTIDE: B Natriuretic Peptide: 571 pg/mL — ABNORMAL HIGH (ref 0.0–100.0)

## 2015-06-28 NOTE — ED Notes (Signed)
PT ambulated to restroom at this time with NAD noted and with stand by assist.

## 2015-06-28 NOTE — ED Provider Notes (Signed)
CSN: [id number]     Arrival date & time 06/28/15  1128 History   First MD Initiated Contact with Patient 06/28/15 1214     Chief Complaint  Patient presents with  . Irregular Heart Beat      The history is provided by the patient, the spouse and a caregiver. The history is limited by the condition of the patient (Hx dementia).  Pt was seen at 1230. Per pt, her husband and her caregiver:  Pt with "fluctuating HR and BP" for the past 2 weeks. Has been associated with generalized weakness/fatigue. Pt's husband states pt's coreg dose was increased approximately 1 week ago. Pt's family states her BP can range from "83/50" to "151/102" and HR "45-57 then 113-118." Pt herself denies CP/palpitations, no SOB/cough, no abd pain, no N/V/D, no focal motor weakness.    Past Medical History  Diagnosis Date  . Sinoatrial node dysfunction (HCC)      SINUS BRADYCARDIA  . Hypertension     unspecified  . Atrial fibrillation (HCC)   . Encounter for long-term (current) use of other medications     COUMADIN THERAPY  . Lower extremity weakness   . Sinus bradycardia   . Coronary artery disease   . Dementia   . Bilateral renal artery stenosis (HCC)     MODERATE by dopplers August 2012.  Marland Kitchen Chronic renal insufficiency    Past Surgical History  Procedure Laterality Date  . Knee arthroscopy    . Cardioversion      x2  . Colonoscopy  05/2011    Dr. Allena Katz: normal. no bx.  . Cholecystectomy    . Back surgery     Family History  Problem Relation Age of Onset  . Cancer Brother   . Heart failure Mother   . Cirrhosis Other   . High blood pressure Mother    Social History  Substance Use Topics  . Smoking status: Never Smoker   . Smokeless tobacco: Never Used     Comment: Never smoked  . Alcohol Use: No    Review of Systems  Unable to perform ROS: Dementia      Allergies  Review of patient's allergies indicates no known allergies.  Home Medications   Prior to Admission medications    Medication Sig Start Date End Date Taking? Authorizing Provider  ALPRAZolam Prudy Feeler) 0.5 MG tablet Take 0.5 mg by mouth at bedtime as needed for anxiety or sleep.    Yes Historical Provider, MD  Biotin 5000 MCG TABS Take 1 tablet by mouth daily. 10,000 mcg daily   Yes Historical Provider, MD  Calcium Carbonate-Vitamin D (CALTRATE 600+D PO) Take 1 tablet by mouth 2 (two) times daily.    Yes Historical Provider, MD  carvedilol (COREG) 12.5 MG tablet Take 1 tablet (12.5 mg total) by mouth 2 (two) times daily. 06/27/15  Yes Laqueta Linden, MD  Cyanocobalamin (VITAMIN B-12 PO) Take 1 tablet by mouth daily.   Yes Historical Provider, MD  diltiazem (CARDIZEM) 90 MG tablet Take 1 tablet (90 mg total) by mouth 2 (two) times daily. 06/18/15  Yes Laqueta Linden, MD  donepezil (ARICEPT) 10 MG tablet Take 1 tablet by mouth at bedtime. 08/06/14  Yes Historical Provider, MD  feeding supplement, ENSURE COMPLETE, (ENSURE COMPLETE) LIQD Take 237 mLs by mouth 3 (three) times daily between meals. 09/07/14  Yes Erick Blinks, MD  furosemide (LASIX) 20 MG tablet TAKE LASIX 20 MG A DAY ALTERNATING WITH LASIX 40 MG A DAY 06/18/15  Yes Laqueta Linden, MD  HYDROcodone-acetaminophen (NORCO/VICODIN) 5-325 MG per tablet Take 1 tablet by mouth every 8 (eight) hours as needed for moderate pain.   Yes Historical Provider, MD  memantine (NAMENDA) 10 MG tablet Take 1 tablet by mouth 2 (two) times daily. 08/09/14  Yes Historical Provider, MD  mirtazapine (REMERON) 15 MG tablet Take 1 tablet by mouth at bedtime. 05/31/15  Yes Historical Provider, MD  Multiple Vitamin (MULTIVITAMIN) tablet Take 1 tablet by mouth daily.    Yes Historical Provider, MD  potassium chloride (K-DUR) 10 MEQ tablet Take 1 tablet (10 mEq total) by mouth daily. 06/18/15  Yes Laqueta Linden, MD  warfarin (COUMADIN) 2 MG tablet Take 2 tablets by mouth Tuesday, Thursday and Saturday. Take 1 tablet all other days 06/06/15  Yes Laqueta Linden, MD    BP 122/75 mmHg  Pulse 68  Temp(Src) 98.1 F (36.7 C) (Oral)  Resp 18  Ht  (1.651 m)  Wt 118 lb (53.524 kg)  BMI 19.64 kg/m2  SpO2 100%   Filed Vitals:   06/28/15 1313 06/28/15 1315 06/28/15 1330 06/28/15 1400  BP: 122/75 122/75 135/100 158/93  Pulse: 68 59 56 64  Temp:      TempSrc:      Resp: Height:      Weight:      SpO2: 100% 99% 98% 96%     13:08 Orthostatic Vital Signs DM  Orthostatic Lying  - BP- Lying: 124/70 mmHg ; Pulse- Lying: 66  Orthostatic Sitting - BP- Sitting: 136/90 mmHg ; Pulse- Sitting: 65  Orthostatic Standing at 0 minutes - BP- Standing at 0 minutes: 122/75 mmHg ; Pulse- Standing at 0 minutes: 68      Physical Exam  1235: Physical examination:  Nursing notes reviewed; Vital signs and O2 SAT reviewed;  Constitutional: Well developed, Well nourished, Well hydrated, In no acute distress; Head:  Normocephalic, atraumatic; Eyes: EOMI, PERRL, No scleral icterus; ENMT: Mouth and pharynx normal, Mucous membranes moist; Neck: Supple, Full range of motion, No lymphadenopathy; Cardiovascular: Irregular irregular rate and rhythm, No gallop; Respiratory: Breath sounds clear & equal bilaterally, No wheezes.  Speaking full sentences with ease, Normal respiratory effort/excursion; Chest: Nontender, Movement normal; Abdomen: Soft, Nontender, Nondistended, Normal bowel sounds; Genitourinary: No CVA tenderness; Extremities: Pulses normal, No tenderness, No edema, No calf edema or asymmetry.; Neuro: Awake, alert, confused per hx dementia. Major CN grossly intact. No facial droop. Speech clear. Moves all extremities spontaneously without apparent gross focal motor deficits. Climbs on and off stretcher easily by herself. Gait steady.; Skin: Color normal, Warm, Dry.   ED Course  Procedures (including critical care time) Labs Review   Imaging Review  I have personally reviewed and evaluated these images and lab results as part of my medical  decision-making.   EKG Interpretation   Date/Time:  Friday June 28 2015 11:37:31 EST Ventricular Rate:  70 PR Interval:    QRS Duration: 84 QT Interval:  392 QTC Calculation: 423 R Axis:   -24 Text Interpretation:  Atrial fibrillation Minimal voltage criteria for  LVH, may be normal variant Cannot rule out Anterior infarct , age  undetermined Nonspecific ST and T wave abnormality Anterolateral leads  Baseline wander When compared with ECG of 10/05/2014 Nonspecific ST and T  wave abnormality is now Present Confirmed by [facility]  MD, Nicholos Johns [phone])  on 06/28/2015 1:02:45 PM      MDM  MDM Reviewed: previous chart, nursing note and vitals  Reviewed previous: labs and ECG Interpretation: labs, ECG and x-ray      Results for orders placed or performed during the hospital encounter of 06/28/15  CBC with Differential  Result Value Ref Range   WBC 8.0 4.0 - 10.5 K/uL   RBC 5.42 (H) 3.87 - 5.11 MIL/uL   Hemoglobin 15.5 (H) 12.0 - 15.0 g/dL   HCT 01.6 (H) 55.3 - 74.8 %   MCV 89.1 78.0 - 100.0 fL   MCH 28.6 26.0 - 34.0 pg   MCHC 32.1 30.0 - 36.0 g/dL   RDW 27.0 (H) 78.6 - 75.4 %   Platelets 196 150 - 400 K/uL   Neutrophils Relative % 77 %   Neutro Abs 6.2 1.7 - 7.7 K/uL   Lymphocytes Relative 11 %   Lymphs Abs 0.9 0.7 - 4.0 K/uL   Monocytes Relative 9 %   Monocytes Absolute 0.7 0.1 - 1.0 K/uL   Eosinophils Relative 2 %   Eosinophils Absolute 0.1 0.0 - 0.7 K/uL   Basophils Relative 1 %   Basophils Absolute 0.1 0.0 - 0.1 K/uL  Basic metabolic panel  Result Value Ref Range   Sodium 140 135 - 145 mmol/L   Potassium 4.1 3.5 - 5.1 mmol/L   Chloride 104 101 - 111 mmol/L   CO2 31 22 - 32 mmol/L   Glucose, Bld 194 (H) 65 - 99 mg/dL   BUN 37 (H) 6 - 20 mg/dL   Creatinine, Ser 4.92 (H) 0.44 - 1.00 mg/dL   Calcium 9.8 8.9 - 01.0 mg/dL   GFR calc non Af Amer 41 (L) >60 mL/min   GFR calc Af Amer 48 (L) >60 mL/min   Anion gap 5 5 - 15  Troponin I  Result Value Ref Range    Troponin I <0.03 <0.031 ng/mL  Protime-INR  Result Value Ref Range   Prothrombin Time 25.5 (H) 11.6 - 15.2 seconds   INR 2.35 (H) 0.00 - 1.49  Urinalysis, Routine w reflex microscopic  Result Value Ref Range   Color, Urine YELLOW YELLOW   APPearance CLEAR CLEAR   Specific Gravity, Urine 1.015 1.005 - 1.030   pH 5.5 5.0 - 8.0   Glucose, UA NEGATIVE NEGATIVE mg/dL   Hgb urine dipstick NEGATIVE NEGATIVE   Bilirubin Urine NEGATIVE NEGATIVE   Ketones, ur NEGATIVE NEGATIVE mg/dL   Protein, ur NEGATIVE NEGATIVE mg/dL   Nitrite NEGATIVE NEGATIVE   Leukocytes, UA NEGATIVE NEGATIVE  Brain natriuretic peptide  Result Value Ref Range   B Natriuretic Peptide 571.0 (H) 0.0 - 100.0 pg/mL   Dg Chest Portable 1 View 06/28/2015  CLINICAL DATA:  Weakness.  Atrial fibrillation. EXAM: PORTABLE CHEST 1 VIEW COMPARISON:  01/17/2015 FINDINGS: Heart size and pulmonary vascularity are normal. Chronic slight accentuation of the interstitial markings. Tortuosity and calcification of the thoracic aorta. No acute osseous abnormality. Old compression fractures in the upper lumbar spine treated with vertebroplasty. IMPRESSION: No acute abnormality.  Stable chronic lung disease. Electronically Signed   By: Francene Boyers M.D.   On: 06/28/2015 12:22    Results for AHONESTI, EGBERS (MRN [id number]) as of 06/28/2015 13:57  Ref. Range 01/18/2015 03:00 01/19/2015 06:23 06/28/2015 12:00  BUN Latest Ref Range: 6-20 mg/dL 58 (H) 54 (H) 37 (H)  Creatinine Latest Ref Range: 0.44-1.00 mg/dL 8.32 (H) 5.49 (H) 8.26 (H)     1330:  T/C to Cards Dr. Purvis Sheffield, case discussed, including:  HPI, pertinent PM/SHx, VS/PE, dx testing, ED course and treatment:  States no  indication to admit pt to hospital at this time, have pt decrease coreg to 12.5mg  BID and he will f/u in office Wednesday.   1445:  Today's NS STTW changes on EKG are similar to previous EKG in 2015. BNP per baseline. BUN/Cr is improved from previous. Pt is not  orthostatic on VS. Pt has ambulated with steady gait, easy resps, NAD. Dx and testing, as well as d/w Cards MD, d/w pt and family.  Questions answered.  Verb understanding, agreeable to d/c home with outpt f/u.      Samuel Jester, DO 07/01/15 1605

## 2015-06-28 NOTE — Discharge Instructions (Signed)
°Emergency Department Resource Guide °1) Find a Doctor and Pay Out of Pocket °Although you won't have to find out who is covered by your insurance plan, it is a good idea to ask around and get recommendations. You will then need to call the office and see if the doctor you have chosen will accept you as a new patient and what types of options they offer for patients who are self-pay. Some doctors offer discounts or will set up payment plans for their patients who do not have insurance, but you will need to ask so you aren't surprised when you get to your appointment. ° °2) Contact Your Local Health Department °Not all health departments have doctors that can see patients for sick visits, but many do, so it is worth a call to see if yours does. If you don't know where your local health department is, you can check in your phone book. The CDC also has a tool to help you locate your state's health department, and many state websites also have listings of all of their local health departments. ° °3) Find a Walk-in Clinic °If your illness is not likely to be very severe or complicated, you may want to try a walk in clinic. These are popping up all over the country in pharmacies, drugstores, and shopping centers. They're usually staffed by nurse practitioners or physician assistants that have been trained to treat common illnesses and complaints. They're usually fairly quick and inexpensive. However, if you have serious medical issues or chronic medical problems, these are probably not your best option. ° °No Primary Care Doctor: °- Call [facility] Connect at  832-8000 - they can help you locate a primary care doctor that  accepts your insurance, provides certain services, etc. °- Physician Referral Service- 1-800-533-3463 ° °Chronic Pain Problems: °Organization         Address  Phone   Notes  °[facility]  ([phone]) [phone] Patients need to be referred by their primary care doctor.  ° °Medication  Assistance: °Organization         Address  Phone   Notes  °[place] [facility] [address] °[place], [place] [address] (336) 641-8030 --Must be a resident of [place] °-- Must have NO insurance coverage whatsoever (no Medicaid/ Medicare, etc.) °-- The pt. MUST have a primary care doctor that directs their care regularly and follows them in the community °  °[facility]  (866) 331-1348   °[employer]  (888) 892-1162   ° °Agencies that provide inexpensive medical care: °Organization         Address  Phone   Notes  °[facility]  (336) 832-8035   °[facility]    (336) 832-7272   °[facility] [address] °[place], [place] [address] (336) 832-4777   °[facility] [address], °[place] (336) 271-4999   °[facility]    (336) 373-0678   °[facility]    (336) 272-1050   °[facility] ° [address], [place] Phone:  (336) 832-4444, Fax:  (336) 832-4440 Hours of Operation:  9 am - 6 pm, M-F.  Also accepts Medicaid/Medicare and self-pay.  °[facility] ° [address], [place] Phone: (336) [phone], Fax: (336) [phone]. Hours of Operation:  8:30 am - 5:30 pm, M-F.  Also accepts Medicaid and self-pay.  °[facility] [place] [address], [place] Phone: (336) 878-6027   °[facility] [address], [place], [place] (336)723-1848, Ext. 123 Mondays & Thursdays: 7-9 AM.  First 15 patients are seen on a first come, first serve basis. °  ° °Medicaid-accepting [place] Providers: ° °Organization         Address  Phone   Notes  °[facility] [address], [place] (336) 641-2100 Also accepts self-pay patients.  °[facility] [address], [place] ° (336) 856-9996   °[facility] [address], [place]  (336) 288-8857   °[facility] [address] [place] [address], [place] (336) 299-7000   °Veita Bland [address], [place]  ° (336) 373-1557 Only accepts [place] Access Medicaid patients after they have their name applied to their card.  ° °Self-Pay (no insurance) in [place]: ° °Organization         Address  Phone   Notes  °Sickle Cell Patients, [facility] [address], [place] (336) 832-1970   °[facility] [address], [place] (336) 832-4400   °[facility] ° [address], [place] (336) 992-4800   °[facility]/Dr. Osei-Bonsu ° [address] [place] [address], [place] or [address], [place] (336) [phone] Phone number for both [place] and [place] locations is the same.  °[facility] [address], [place] (336) 299-0000   °[facility] [address] [place] [address], [place] or [address] (336) 852-7530 °(336) 878-2260   °[facility] [address], [place] (336) 350-1642, phone; (336) 294-5005, fax Sees patients 1st and 3rd Saturday of every month.  Must not qualify for public or private insurance (i.e. Medicaid, Medicare, [place] Health Choice, Veterans' Benefits) • Household income should be no more than 200% of the poverty level •The clinic cannot treat you if you are pregnant or think you are pregnant • Sexually transmitted diseases are not treated at the clinic.  ° ° °Dental Care: °Organization         Address  Phone  Notes  °[place] [facility] [address], [place] (336) 641-6152 Accepts children up to age 21 who are enrolled in Medicaid or [place] Health Choice; pregnant women with a Medicaid card; and children who have applied for Medicaid or [place] Health Choice, but were declined, whose parents can pay a reduced fee at time of service.  °[place]  [facility] [place]  [address], [place] (336) 641-7733 Accepts children up to age 21 who are enrolled in Medicaid or [place] Health Choice; pregnant women with a Medicaid card; and children who have applied for Medicaid or [place] Health Choice, but were declined, whose parents can pay a reduced fee at time of service.  °[facility] ° [address], [place] (336) 641-4533 Patients are seen by appointment only. Walk-ins are not accepted. [facility] will see patients 18 years of age and older. °Monday - Tuesday (8am-5pm) °Most Wednesdays (8:30-5pm) °$30 per visit, cash only  °[facility] ° [address], [place] (336) 641-4533 Patients are seen by appointment only. Walk-ins are not accepted. [facility] will see patients 18 years of age and older. °One   Wednesday Evening (Monthly: Volunteer Based).  $30 per visit, cash only  °[facility]  (919) 537-3737 for adults; Children under age 4, call Graduate Pediatric Dentistry at (919) [phone]. Children aged 4-14, please call (919) [phone] to request a pediatric application. ° Dental services are provided in all areas of dental care including fillings, crowns and bridges, complete and partial dentures, implants, gum treatment, root canals, and extractions. Preventive care is also provided. Treatment is provided to both adults and children. °Patients are selected via a lottery and there is often a waiting list. °  °[facility] [address], °[place] ° (336) 763-8833 [email or web address].[email or web address] °  °[facility] [address], [place], [place] (336)723-1848, Ext. 123 Second and Fourth Thursday of each month, opens at 6:30 AM; Clinic ends at 9 AM.  Patients are seen on a first-come first-served basis, and a limited number are seen during each clinic.  ° °[facility] ° [address], [place], [place] (336) [phone]    Eligibility Requirements °You must have lived in [place], [place], or [place] counties for at least the last three months. °  You cannot be eligible for state or federal sponsored healthcare insurance, including Veterans Administration, Medicaid, or Medicare. °  You generally cannot be eligible for healthcare insurance through your employer.  °  How to apply: °Eligibility screenings are held every Tuesday and Wednesday afternoon from 1:00 pm until 4:00 pm. You do not need an appointment for the interview!  °[facility] [address], [place], [place] 336-631-2330   °[facility]  336-342-8273   °[facility]  336-703-3100   °[facility]  336-570-6415   ° °Behavioral Health Resources in the Community: °Intensive Outpatient Programs °Organization         Address  Phone  Notes  °[facility] [address], [place], [place] 336-878-6098   °[facility] [address], [place], [place] 336-832-9800   °[facility] [address], [place], [place] ° 336-882-2125   °[facility] [address],  °[place], [place] 1-800-853-5163 or 336-641-4981   °Substance Abuse Resources °Organization         Address  Phone  Notes  °[facility]  336-882-2125   °[facility]  336-784-9470   °[facility]  336-285-9073   °[facility]  336-845-3988   °Residential & Outpatient Substance Abuse Program  1-800-659-3381   °Psychological Services °Organization         Address  Phone  Notes  °[facility]  336- 832-9600   °[facility]  336- 378-7881   °[facility] [address], [place] 1-800-853-5163 or 336-641-4981   ° °Mobile Crisis Teams °Organization         Address  Phone  Notes  °[facility], Mobile Crisis Care Unit  1-877-626-1772   °Assertive °[facility] ° [address]  [place], [place] 336-834-9664   °Sharon DeEsch [address] °[place] 336-554-5454   ° °Self-Help/Support Groups °Organization         Address  Phone             Notes  °[facility]  - variety of support groups  336- 373-1402 Call for more information  °Narcotics Anonymous (NA), [facility] [address], °[place]  2 meetings at this location  ° °  Residential Treatment Programs Organization         Address  Phone  Notes  [facility] [address],    [place]  4-268-341-9622   [facility]  [address], [place] [address], [place], [place] 211-941-7408   [facility] [address] [place], [place] 144-818-5631 Admissions: 8am-3pm M-F  [facility] [address],    [place], [place] 497-026-3785   [facility] [address] [place], [place], [place] 885-027-7412   [facility] [address],  [place], [place] 878-676-7209   [facility] Intensive Outpatient [address], [place], [place] 470-962-8366   [facility] ([facility] [address]  [place], [place] 2-947-654-6503 or (731)329-9670   [facility] [address] [place], [place] 170-017-4944 Accepts Medicaid  [facility] [address]  [place] 9-675-916-3846 Substance Abuse/Addiction Treatment   [facility] Organization         Address  Phone  Notes  [facility]  763-664-3516   Angie Fava, PhD [address] [place], [place]   423-710-8939 or 984-615-1729   [facility]   [address] [place], [place] (272)576-3347   [facility] [address], [place], [place] [phone] Insurance/Medicaid/sponsorship through [facility] and Families [address]                                    [place], [place] (539)642-5690 Therapy/tele-psych/case    [facility] [address], [place] [phone]    Dr. Lolly Mustache  [phone]   [facility]  [employer] [facility] 1) [address] [name] 2) [address], [place] 3)  [address], [place] (702)[phone] ([phone]  (469)[phone]   [facility] Child Abuse Hotline (912)629-3914 or (667)534-8324 (After Hours)      Decrease your coreg dose to 12.5mg  by mouth twice a day.  Call your regular Cardiologist doctor today to confirm your scheduled follow up appointment for Wednesday. Return to the Emergency Department immediately sooner if worsening.

## 2015-06-28 NOTE — ED Notes (Addendum)
Husband reports for the past 2 weeks pt's bp and HR have been fluctuating.  Has history of afib.  Pt c/o increasing generalized weakness.  Denies pain.   According to pt's notes, hr has been fluctuating from 45- 57, and 118-113.  BP has been as low as 83/50 and as high as 151/102.

## 2015-07-02 ENCOUNTER — Encounter: Payer: Self-pay | Admitting: Physician Assistant

## 2015-07-02 DIAGNOSIS — I482 Chronic atrial fibrillation, unspecified: Secondary | ICD-10-CM | POA: Insufficient documentation

## 2015-07-02 DIAGNOSIS — I701 Atherosclerosis of renal artery: Secondary | ICD-10-CM | POA: Insufficient documentation

## 2015-07-02 DIAGNOSIS — N183 Chronic kidney disease, stage 3 unspecified: Secondary | ICD-10-CM | POA: Insufficient documentation

## 2015-07-02 DIAGNOSIS — I10 Essential (primary) hypertension: Secondary | ICD-10-CM | POA: Insufficient documentation

## 2015-07-02 NOTE — Progress Notes (Addendum)
Cardiology Office Note Date:  07/03/2015  Patient ID:  [name], DOB [birth year], MRN [id number] PCP:  Ignatius Specking., MD  Cardiologist: Dr. Purvis Sheffield  Chief Complaint: fluctuating BP, AF  History of Present Illness: [name] is a 81 y.o. female with history of chronic atrial fib, chronic diastolic CHF, HTN, CKD III, h/o sinus bradycardia, right renal artery stenosis (1-59% in 04/2013), moderate pericardial effusion 08/2014, GERD, dementia who presents for f/u. CAD was added by a nurse in 2014 but there are no further details mentioned on this in the cardiology notes. Last 2D echo 08/2014: mild LVH, EF 55-60%, hypokinesis of basal mid-anteroseptal myocardium, mild AI/MR, severely dilated LA, mildly dilated RV/mildly reduced RV function, mod increased PASP , moderate pericardial effusion. She saw Dr. Purvis Sheffield 06/18/15 at which time BP was elevated thus Coreg was increased to 12.5mg  BID. She was seen in the ER 06/28/15 for fluctuating HR and BP for the last 2 weeks. Her family reported her BP could range from "83/50" to "151/102" and HR "45-57 then 113-118." The patient herself had denied any symptoms and was hemodynamically stable during the visit. Labs showed normal WBC, Hgb 15.5, Plt 196, BUN/Cr 38/1.18 (improved from 01/2015), glucose 194.  She comes in today for follow-up accompanied by her husband and caregiver. In talking to them all, it sounds like she's had general symptoms of failure to thrive for a while. The patient herself doesn't offer much to the conversation. Her family reports decreased appetite, decreased motivation for activity, sleeping a lot, and keeping to herself. She has not had any cardiac symptoms including CP, SOB, or palpitations. Her family is concerned that the increased carvedilol contributed to increased fatigue, although they report this was present even before the dose was recently increased.  Past Medical History  Diagnosis Date  . Sinoatrial  node dysfunction (HCC)      SINUS BRADYCARDIA  . Essential hypertension   . Chronic atrial fibrillation (HCC)   . Encounter for long-term (current) use of other medications     COUMADIN THERAPY  . Lower extremity weakness   . Sinus bradycardia   . Coronary artery disease   . Dementia   . Renal artery stenosis (HCC)     a. right renal artery stenosis (1-59% by duplex in 04/2013).  . CKD (chronic kidney disease), stage III   . Chronic diastolic CHF (congestive heart failure) (HCC)   . Pericardial effusion     a. Echo 08/2014: mod pericardial effusion.  Marland Kitchen GERD (gastroesophageal reflux disease)     Past Surgical History  Procedure Laterality Date  . Knee arthroscopy    . Cardioversion      x2  . Colonoscopy  05/2011    Dr. Allena Katz: normal. no bx.  . Cholecystectomy    . Back surgery      Current Outpatient Prescriptions  Medication Sig Dispense Refill  . ALPRAZolam (XANAX) 0.5 MG tablet Take 0.5 mg by mouth at bedtime as needed for anxiety or sleep.     . Biotin 5000 MCG TABS Take 1 tablet by mouth daily. 10,000 mcg daily    . Calcium Carbonate-Vitamin D (CALTRATE 600+D PO) Take 1 tablet by mouth 2 (two) times daily.     . carvedilol (COREG) 12.5 MG tablet Take 1 tablet (12.5 mg total) by mouth 2 (two) times daily. 60 tablet 6  . Cyanocobalamin (VITAMIN B-12 PO) Take 1 tablet by mouth daily.    Marland Kitchen diltiazem (CARDIZEM) 90 MG tablet Take 1 tablet (  90 mg total) by mouth 2 (two) times daily. 60 tablet 6  . donepezil (ARICEPT) 10 MG tablet Take 1 tablet by mouth at bedtime.  6  . feeding supplement, ENSURE COMPLETE, (ENSURE COMPLETE) LIQD Take 237 mLs by mouth 3 (three) times daily between meals. 90 Bottle 1  . furosemide (LASIX) 20 MG tablet TAKE LASIX 20 MG A DAY ALTERNATING WITH LASIX 40 MG A DAY 45 tablet 6  . HYDROcodone-acetaminophen (NORCO/VICODIN) 5-325 MG per tablet Take 1 tablet by mouth every 8 (eight) hours as needed for moderate pain.    . memantine (NAMENDA) 10 MG tablet  Take 1 tablet by mouth 2 (two) times daily.  3  . mirtazapine (REMERON) 15 MG tablet Take 1 tablet by mouth at bedtime.  5  . Multiple Vitamin (MULTIVITAMIN) tablet Take 1 tablet by mouth daily.     . potassium chloride (K-DUR) 10 MEQ tablet Take 1 tablet (10 mEq total) by mouth daily. 30 tablet 0  . warfarin (COUMADIN) 2 MG tablet Take 2 tablets by mouth Tuesday, Thursday and Saturday. Take 1 tablet all other days 45 tablet 2   No current facility-administered medications for this visit.    Allergies:   Review of patient's allergies indicates no known allergies.   Social History:  The patient  reports that she has never smoked. She has never used smokeless tobacco. She reports that she does not drink alcohol or use illicit drugs.   Family History:  The patient's family history includes Cancer in her brother; Cirrhosis in her other; Heart failure in her mother; High blood pressure in her mother.   ROS:  Please see the history of present illness.   All other systems are reviewed and otherwise negative.   PHYSICAL EXAM:  VS:  BP 112/64 mmHg  Pulse 68  Ht  (1.626 m)  Wt 119 lb (53.978 kg)  BMI 20.42 kg/m2  SpO2 92% BMI: Body mass index is 20.42 kg/(m^2). Well developed thin WF in no acute distress HEENT: normocephalic, atraumatic Neck: no JVD, carotid bruits or masses Cardiac:  Irregularly irregular, rate controlled, no murmurs, rubs, or gallops Lungs:  clear to auscultation bilaterally, no wheezing, rhonchi or rales Abd: soft, nontender, no hepatomegaly, + BS MS: no deformity or atrophy Ext: no edema Skin: warm and dry, no rash Neuro:  moves all extremities spontaneously, no focal abnormalities noted, follows commands Psych: flat affect, quiet   EKG:  Done today shows atrial fib 60bpm, TWI inferiorly and V4-V6; no sig change from prior  Recent Labs: 09/03/2014: Magnesium 2.3 01/19/2015: ALT 43 06/28/2015: B Natriuretic Peptide 571.0*; BUN 37*; Creatinine, Ser 1.18*;  Hemoglobin 15.5*; Platelets 196; Potassium 4.1; Sodium 140  No results found for requested labs within last 365 days.   Estimated Creatinine Clearance: 30.3 mL/min (by C-G formula based on Cr of 1.18).   Wt Readings from Last 3 Encounters:  07/03/15 119 lb (53.978 kg)  06/28/15 118 lb (53.524 kg)  06/18/15 122 lb (55.339 kg)     Other studies reviewed: Additional studies/records reviewed today include: summarized above  ASSESSMENT AND PLAN:  1. Essential HTN with fluctuating BP - her caregiver and husband check her vitals frequently and recently noticed lability HR and BP. I am not convinced we can attribute all of her symptoms to either the carvedilol or her cardiovascular status. It sounds more like a failure-to-thrive type picture in the setting of her dementia. Nevertheless, will try decreasing her dose of carvedilol to a "middle" dose of  9.375mg  BID to see if this helps -- her family is aware to go back to the 6.25mg  DOSE TABLETS and take 1.5 tablets twice a day. I wonder if there is any contribution from recently elevated blood sugar. I have asked them to discuss further with PCP. 2. Chronic AF - see above. Continue Coumadin. No evidence of anemia by recent labs. 3. Chronic diastolic CHF - appears euvolemic. 4. CKD stage III - stable by recent labs.  5. Right renal artery stenosis - will defer timing of surveillance duplex to primary cardiologist. 6. Hyperglycemia - f/u PCP for this.  Disposition: F/u with Dr. Purvis Sheffield in 6 weeks.  Current medicines are reviewed at length with the patient today.  The patient did not have any concerns regarding medicines.  Signed, Karen Spies PA-C 07/03/2015 1:42 PM     [facility] [place] [facility] [address] [place], [place] [address] [phone]

## 2015-07-03 ENCOUNTER — Ambulatory Visit (INDEPENDENT_AMBULATORY_CARE_PROVIDER_SITE_OTHER): Payer: Medicare Other | Admitting: Physician Assistant

## 2015-07-03 ENCOUNTER — Encounter: Payer: Self-pay | Admitting: Physician Assistant

## 2015-07-03 VITALS — BP 112/64 | HR 68 | Ht 64.0 in | Wt 119.0 lb

## 2015-07-03 DIAGNOSIS — N183 Chronic kidney disease, stage 3 unspecified: Secondary | ICD-10-CM

## 2015-07-03 DIAGNOSIS — I5032 Chronic diastolic (congestive) heart failure: Secondary | ICD-10-CM

## 2015-07-03 DIAGNOSIS — I10 Essential (primary) hypertension: Secondary | ICD-10-CM | POA: Diagnosis not present

## 2015-07-03 DIAGNOSIS — N189 Chronic kidney disease, unspecified: Secondary | ICD-10-CM

## 2015-07-03 DIAGNOSIS — I4821 Permanent atrial fibrillation: Secondary | ICD-10-CM

## 2015-07-03 DIAGNOSIS — Z7901 Long term (current) use of anticoagulants: Secondary | ICD-10-CM

## 2015-07-03 DIAGNOSIS — I482 Chronic atrial fibrillation, unspecified: Secondary | ICD-10-CM

## 2015-07-03 DIAGNOSIS — R609 Edema, unspecified: Secondary | ICD-10-CM

## 2015-07-03 DIAGNOSIS — R739 Hyperglycemia, unspecified: Secondary | ICD-10-CM

## 2015-07-03 DIAGNOSIS — I701 Atherosclerosis of renal artery: Secondary | ICD-10-CM

## 2015-07-03 MED ORDER — CARVEDILOL 6.25 MG PO TABS: 9.3750 mg | ORAL_TABLET | Freq: Two times a day (BID) | ORAL | Status: DC

## 2015-07-03 NOTE — Patient Instructions (Addendum)
Your physician recommends that you schedule a follow-up appointment in: 6 Weeks with Dr. Purvis Sheffield  Your physician has recommended you make the following change in your medication:   Start Carvedilol 6.25 Take 1 and 1/2 Tablets Two times Daily   If you need a refill on your cardiac medications before your next appointment, please call your pharmacy.  Follow up with Dr. Sherril Croon for High Blood Pressure. Thank you for choosing [facility]!

## 2015-07-25 ENCOUNTER — Ambulatory Visit (INDEPENDENT_AMBULATORY_CARE_PROVIDER_SITE_OTHER): Payer: Medicare Other | Admitting: *Deleted

## 2015-07-25 DIAGNOSIS — Z7901 Long term (current) use of anticoagulants: Secondary | ICD-10-CM

## 2015-07-25 DIAGNOSIS — Z5181 Encounter for therapeutic drug level monitoring: Secondary | ICD-10-CM

## 2015-07-25 DIAGNOSIS — I4891 Unspecified atrial fibrillation: Secondary | ICD-10-CM | POA: Diagnosis not present

## 2015-07-25 LAB — POCT INR: INR: 1.9

## 2015-08-06 ENCOUNTER — Ambulatory Visit: Payer: Medicare Other | Admitting: Cardiovascular Disease

## 2015-08-07 ENCOUNTER — Encounter: Payer: Self-pay | Admitting: Cardiovascular Disease

## 2015-08-27 ENCOUNTER — Ambulatory Visit (INDEPENDENT_AMBULATORY_CARE_PROVIDER_SITE_OTHER): Payer: Medicare Other | Admitting: Pharmacist

## 2015-08-27 DIAGNOSIS — I4891 Unspecified atrial fibrillation: Secondary | ICD-10-CM

## 2015-08-27 DIAGNOSIS — Z7901 Long term (current) use of anticoagulants: Secondary | ICD-10-CM

## 2015-08-27 DIAGNOSIS — Z5181 Encounter for therapeutic drug level monitoring: Secondary | ICD-10-CM | POA: Diagnosis not present

## 2015-08-27 LAB — POCT INR: INR: 2.2

## 2015-09-19 ENCOUNTER — Other Ambulatory Visit: Payer: Self-pay | Admitting: Cardiovascular Disease

## 2015-09-26 ENCOUNTER — Ambulatory Visit (INDEPENDENT_AMBULATORY_CARE_PROVIDER_SITE_OTHER): Payer: Medicare Other | Admitting: *Deleted

## 2015-09-26 DIAGNOSIS — Z7901 Long term (current) use of anticoagulants: Secondary | ICD-10-CM

## 2015-09-26 DIAGNOSIS — Z5181 Encounter for therapeutic drug level monitoring: Secondary | ICD-10-CM

## 2015-09-26 DIAGNOSIS — I4891 Unspecified atrial fibrillation: Secondary | ICD-10-CM | POA: Diagnosis not present

## 2015-09-26 LAB — POCT INR: INR: 2.3

## 2015-10-08 ENCOUNTER — Other Ambulatory Visit: Payer: Self-pay | Admitting: Cardiovascular Disease

## 2015-10-24 ENCOUNTER — Ambulatory Visit (INDEPENDENT_AMBULATORY_CARE_PROVIDER_SITE_OTHER): Payer: Medicare Other | Admitting: *Deleted

## 2015-10-24 DIAGNOSIS — I4891 Unspecified atrial fibrillation: Secondary | ICD-10-CM

## 2015-10-24 DIAGNOSIS — Z7901 Long term (current) use of anticoagulants: Secondary | ICD-10-CM | POA: Diagnosis not present

## 2015-10-24 DIAGNOSIS — Z5181 Encounter for therapeutic drug level monitoring: Secondary | ICD-10-CM | POA: Diagnosis not present

## 2015-10-24 LAB — POCT INR: INR: 2.4

## 2015-12-03 ENCOUNTER — Ambulatory Visit (INDEPENDENT_AMBULATORY_CARE_PROVIDER_SITE_OTHER): Payer: Medicare Other | Admitting: *Deleted

## 2015-12-03 DIAGNOSIS — Z7901 Long term (current) use of anticoagulants: Secondary | ICD-10-CM

## 2015-12-03 DIAGNOSIS — I4891 Unspecified atrial fibrillation: Secondary | ICD-10-CM | POA: Diagnosis not present

## 2015-12-03 DIAGNOSIS — Z5181 Encounter for therapeutic drug level monitoring: Secondary | ICD-10-CM | POA: Diagnosis not present

## 2015-12-03 LAB — POCT INR: INR: 2.1

## 2015-12-16 ENCOUNTER — Ambulatory Visit (INDEPENDENT_AMBULATORY_CARE_PROVIDER_SITE_OTHER): Payer: Medicare Other | Admitting: Cardiovascular Disease

## 2015-12-16 ENCOUNTER — Encounter: Payer: Self-pay | Admitting: Cardiovascular Disease

## 2015-12-16 VITALS — BP 134/88 | HR 82 | Ht 62.0 in | Wt 121.0 lb

## 2015-12-16 DIAGNOSIS — I482 Chronic atrial fibrillation: Secondary | ICD-10-CM

## 2015-12-16 DIAGNOSIS — I10 Essential (primary) hypertension: Secondary | ICD-10-CM | POA: Diagnosis not present

## 2015-12-16 DIAGNOSIS — N183 Chronic kidney disease, stage 3 unspecified: Secondary | ICD-10-CM

## 2015-12-16 DIAGNOSIS — I5032 Chronic diastolic (congestive) heart failure: Secondary | ICD-10-CM

## 2015-12-16 DIAGNOSIS — I701 Atherosclerosis of renal artery: Secondary | ICD-10-CM

## 2015-12-16 DIAGNOSIS — R600 Localized edema: Secondary | ICD-10-CM

## 2015-12-16 DIAGNOSIS — I4821 Permanent atrial fibrillation: Secondary | ICD-10-CM

## 2015-12-16 NOTE — Patient Instructions (Signed)
Continue all current medications. Your physician wants you to follow up in: 6 months.  You will receive a reminder letter in the mail one-two months in advance.  If you don't receive a letter, please call our office to schedule the follow up appointment   

## 2015-12-16 NOTE — Progress Notes (Signed)
Patient ID: [name], female   DOB: Jan 28, 1929, 82 y.o.   MRN: [id number]      SUBJECTIVE: The patient presents for follow-up of atrial fibrillation, chronic diastolic heart failure, and hypertension in the context of chronic kidney disease.  She is feeling very well and denies chest pain, palpitations, leg swelling, and shortness of breath.  Being scheduled for breast surgery in [place].  Echocardiogram 09/04/14 showed normal left ventricular systolic function, EF 55-60%, mild aortic and mitral and moderate tricuspid regurgitation, with a moderate size circumferential pericardial effusion.   Review of Systems: As per "subjective", otherwise negative.  No Known Allergies  Current Outpatient Prescriptions  Medication Sig Dispense Refill  . ALPRAZolam (XANAX) 0.5 MG tablet Take 0.5 mg by mouth at bedtime as needed for anxiety or sleep.     . Biotin 5000 MCG TABS Take 1 tablet by mouth daily. 10,000 mcg daily    . Calcium Carbonate-Vitamin D (CALTRATE 600+D PO) Take 1 tablet by mouth 2 (two) times daily.     . carvedilol (COREG) 6.25 MG tablet Take 1.5 tablets (9.375 mg total) by mouth 2 (two) times daily. (Patient taking differently: Take 12.5 mg by mouth every morning. & 6.25 mg at HS) 90 tablet 11  . Cyanocobalamin (VITAMIN B-12 PO) Take 1 tablet by mouth daily.    Marland Kitchen diltiazem (CARDIZEM) 90 MG tablet Take 1 tablet (90 mg total) by mouth 2 (two) times daily. 60 tablet 6  . donepezil (ARICEPT) 10 MG tablet Take 1 tablet by mouth at bedtime.  6  . feeding supplement, ENSURE COMPLETE, (ENSURE COMPLETE) LIQD Take 237 mLs by mouth 3 (three) times daily between meals. 90 Bottle 1  . furosemide (LASIX) 20 MG tablet TAKE LASIX 20 MG A DAY ALTERNATING WITH LASIX 40 MG A DAY 45 tablet 6  . HYDROcodone-acetaminophen (NORCO/VICODIN) 5-325 MG per tablet Take 1 tablet by mouth every 8 (eight) hours as needed for moderate pain.    . memantine (NAMENDA) 10 MG tablet Take 1 tablet by mouth 2  (two) times daily.  3  . mirtazapine (REMERON) 15 MG tablet Take 1 tablet by mouth at bedtime.  5  . potassium chloride (K-DUR) 10 MEQ tablet Take 1 tablet (10 mEq total) by mouth daily. 30 tablet 0  . warfarin (COUMADIN) 2 MG tablet TAKE 2 TABLETS BY MOUTH ON TUESDAYS, THURSDAYS, AND SATURDAYS. TAKE 1 TABLET ALL OTHER DAYS 45 tablet 3  . Multiple Vitamin (MULTIVITAMIN) tablet Take 1 tablet by mouth daily.      No current facility-administered medications for this visit.    Past Medical History  Diagnosis Date  . Sinoatrial node dysfunction (HCC)      SINUS BRADYCARDIA  . Essential hypertension   . Chronic atrial fibrillation (HCC)   . Encounter for long-term (current) use of other medications     COUMADIN THERAPY  . Lower extremity weakness   . Sinus bradycardia   . Coronary artery disease   . Dementia   . Renal artery stenosis (HCC)     a. right renal artery stenosis (1-59% by duplex in 04/2013).  . CKD (chronic kidney disease), stage III   . Chronic diastolic CHF (congestive heart failure) (HCC)   . Pericardial effusion     a. Echo 08/2014: mod pericardial effusion.  Marland Kitchen GERD (gastroesophageal reflux disease)     Past Surgical History  Procedure Laterality Date  . Knee arthroscopy    . Cardioversion      x2  . Colonoscopy  05/2011    Dr. Allena Katz: normal. no bx.  . Cholecystectomy    . Back surgery      Social History   Social History  . Marital Status: Married    Spouse Name: [name]  . Number of Children: 0  . Years of Education: 12   Occupational History  . RETIRED   .     Social History Main Topics  . Smoking status: Never Smoker   . Smokeless tobacco: Never Used     Comment: Never smoked  . Alcohol Use: No  . Drug Use: No  . Sexual Activity: Not Currently   Other Topics Concern  . Not on file   Social History Narrative   Patient lives at home with her husband [name]) Patient is retired. Patient has 12 th grade education.   Right handed.   Caffeine-  two three cups of coffee daily.     Filed Vitals:   12/16/15 1252  BP: 134/88  Pulse: 82  Height: 5\' 2"  (1.575 m)  Weight: 121 lb (54.885 kg)    PHYSICAL EXAM General: NAD  Neck: No JVD, no thyromegaly.  Lungs: Inspiratory wheezes, no rales. CV: Normal rate, irregular rhythm, normal S1/S2, no S3, II/VI pansystolic murmur along left sternal border. No pedal edema. Venous varicosities b/l. Abdomen: Soft, nontender, no distention.  Neurologic: Alert and oriented.  Psych: Normal affect.  Skin: Normal. Musculoskeletal: No gross deformities. Extremities: No clubbing or cyanosis.    ECG: Most recent ECG reviewed.      ASSESSMENT AND PLAN:  Essential hypertension Controlled. No changes.  Peripheral edema  She has had exacerbations of chronic kidney disease in the past, and has right renal artery stenosis. Weight is stable at 121 lbs. BP is controlled. Taking Lasix 40 mg and 20 mg on alternate days.   Chronic renal insufficiency/Right renal artery stenosis  Repeat renal Dopplers possibly later this year.  Atrial fibrillation  HR controlled and symptomatically stable. On warfarin for anticoagulation, followed here in our clinic.  Chronic diastolic heart failure  Weight is 121 lbs. Taking Lasix 40 mg and 20 mg on alternate days.   Dispo: f/u 6 months.   Prentice Docker, M.D., F.A.C.C.

## 2016-01-14 ENCOUNTER — Ambulatory Visit (INDEPENDENT_AMBULATORY_CARE_PROVIDER_SITE_OTHER): Payer: Medicare Other | Admitting: *Deleted

## 2016-01-14 DIAGNOSIS — Z7901 Long term (current) use of anticoagulants: Secondary | ICD-10-CM

## 2016-01-14 DIAGNOSIS — I4891 Unspecified atrial fibrillation: Secondary | ICD-10-CM | POA: Diagnosis not present

## 2016-01-14 DIAGNOSIS — Z5181 Encounter for therapeutic drug level monitoring: Secondary | ICD-10-CM

## 2016-01-14 LAB — POCT INR: INR: 2.4

## 2016-01-15 ENCOUNTER — Other Ambulatory Visit: Payer: Self-pay | Admitting: Cardiovascular Disease

## 2016-01-15 ENCOUNTER — Encounter: Payer: Self-pay | Admitting: *Deleted

## 2016-02-17 ENCOUNTER — Other Ambulatory Visit: Payer: Self-pay | Admitting: Cardiovascular Disease

## 2016-02-25 ENCOUNTER — Ambulatory Visit (INDEPENDENT_AMBULATORY_CARE_PROVIDER_SITE_OTHER): Payer: Medicare Other | Admitting: *Deleted

## 2016-02-25 DIAGNOSIS — Z7901 Long term (current) use of anticoagulants: Secondary | ICD-10-CM

## 2016-02-25 DIAGNOSIS — I4891 Unspecified atrial fibrillation: Secondary | ICD-10-CM | POA: Diagnosis not present

## 2016-02-25 DIAGNOSIS — Z5181 Encounter for therapeutic drug level monitoring: Secondary | ICD-10-CM | POA: Diagnosis not present

## 2016-02-25 LAB — POCT INR: INR: 2.5

## 2016-02-25 IMAGING — CT CT CHEST W/O CM
2 of 3 series · 15 of 36 positions shown, 18 images · non-contrast
Comparison: 04/13/2014, 04/09/2014

CLINICAL DATA: Reported left lung abnormality on prior exam, leg
swelling, atrial fibrillation, dilated cardiomyopathy

EXAM:
CT CHEST WITHOUT CONTRAST
TECHNIQUE: Multidetector CT imaging of the chest was performed following the
standard protocol without IV contrast..

[Series 2: chestroutine 5.0 b40f · axial · 0.56mm/px · z∈[-252,-42]mm · 12 of 50 slices shown, 15 images]
[im 4/50  mediastinal]
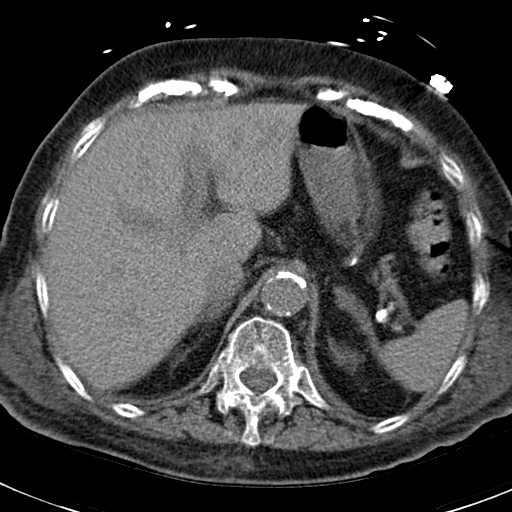
[im 4/50  lung]
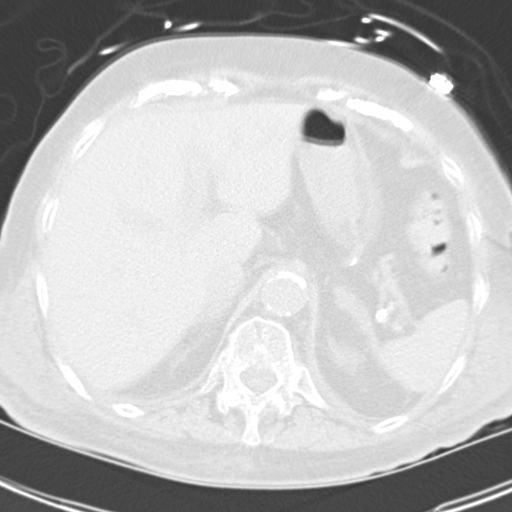
[im 8/50  lung]
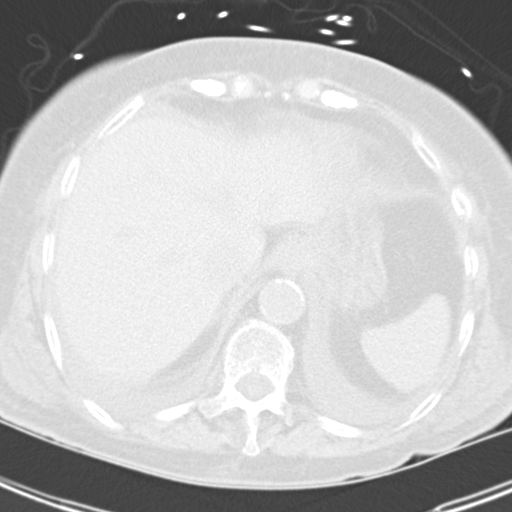
[im 11/50  lung]
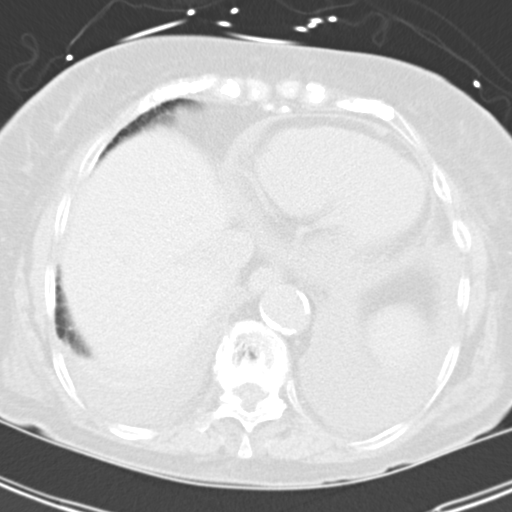
[im 15/50  lung]
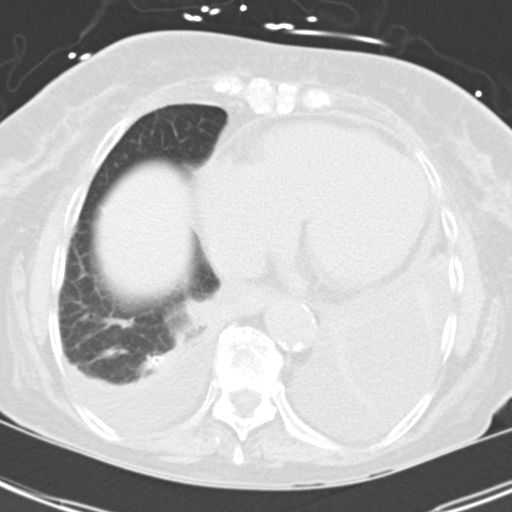
[im 19/50  mediastinal]
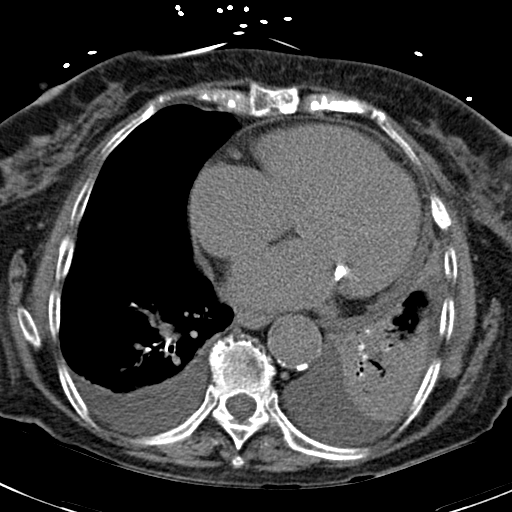
[im 19/50  lung]
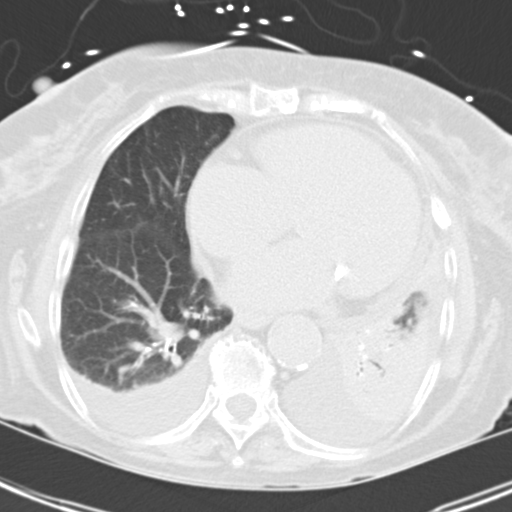
[im 22/50  lung]
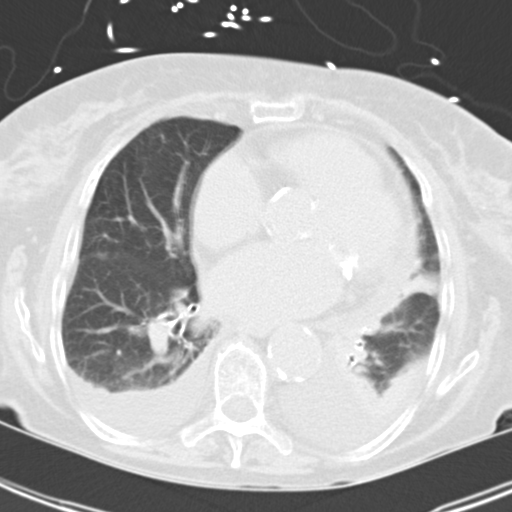
[im 28/50  lung]
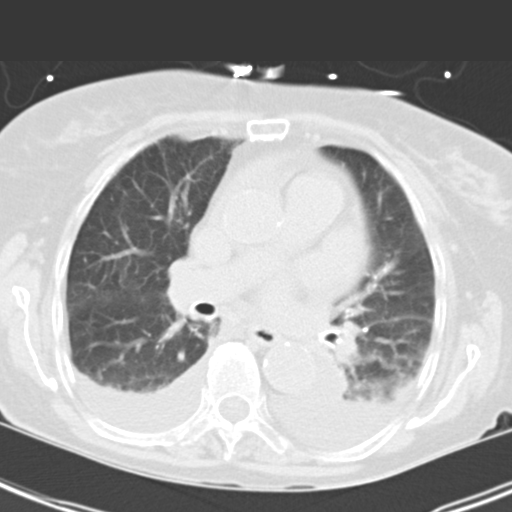
[im 31/50  lung]
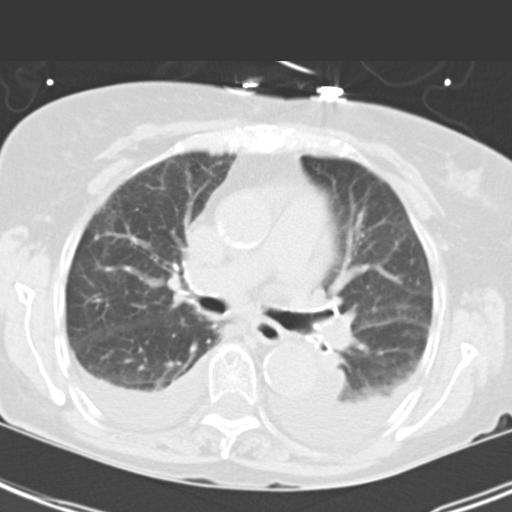
[im 35/50  mediastinal]
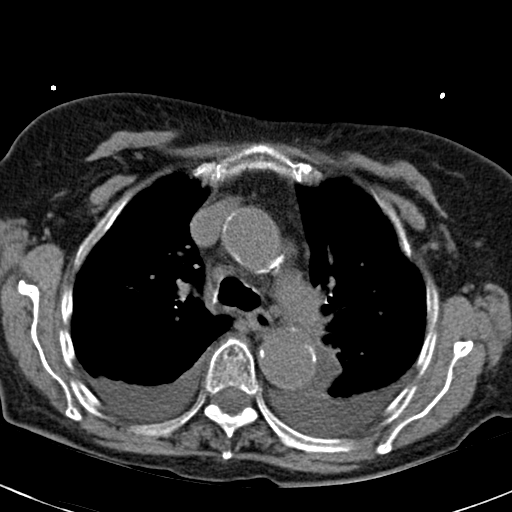
[im 35/50  lung]
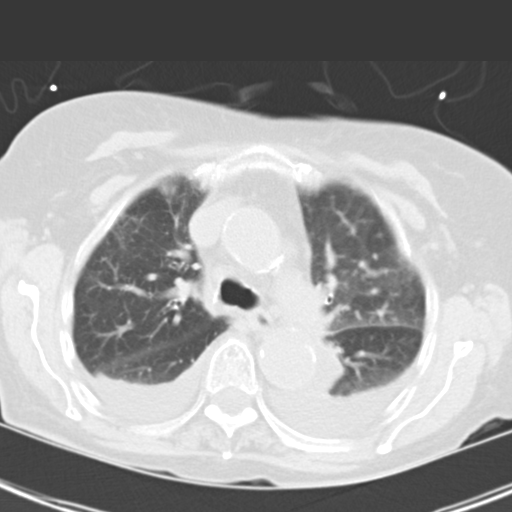
[im 39/50  lung]
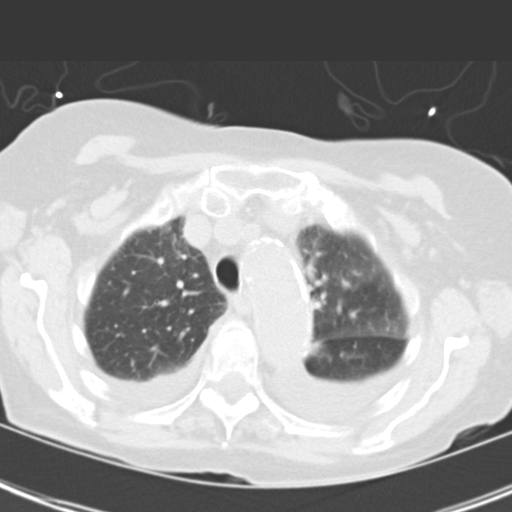
[im 42/50  lung]
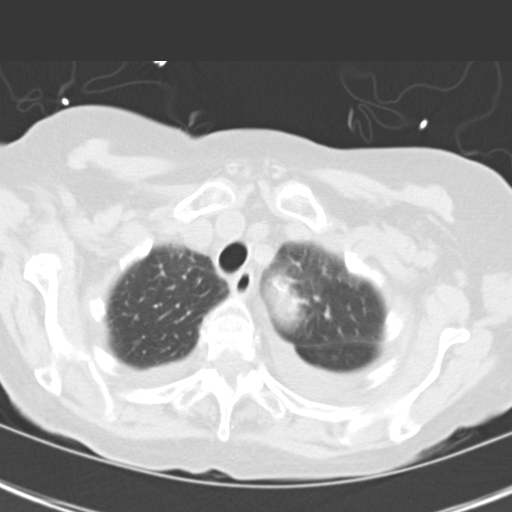
[im 46/50  lung]
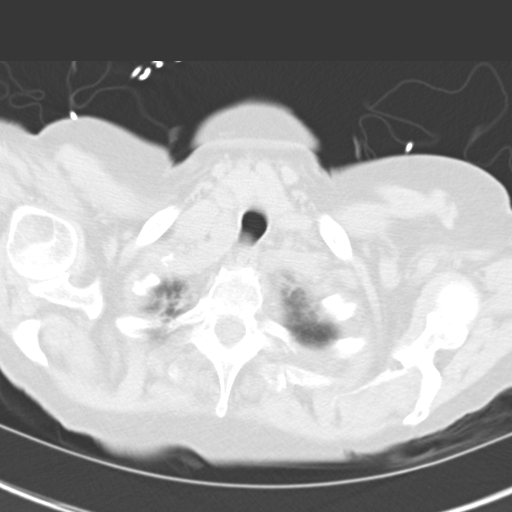

[Series 4: mpr coro 3mm · coronal · 0.64mm/px · 3 of 73 slices shown]
[im 15/73  lung]
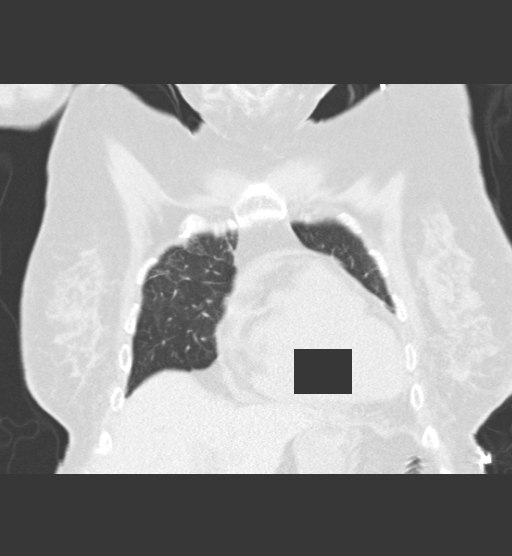
[im 29/73  lung]
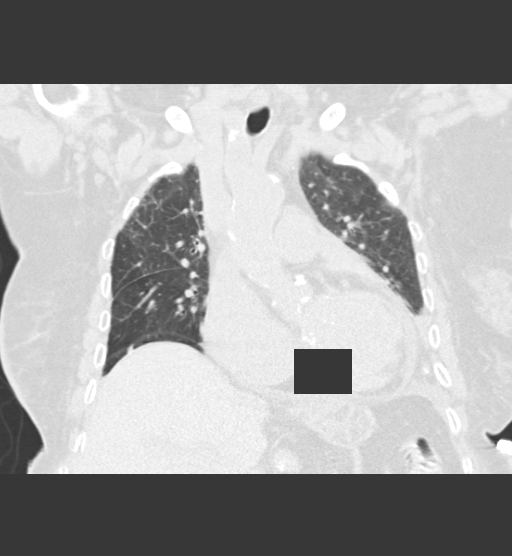
[im 44/73  lung]
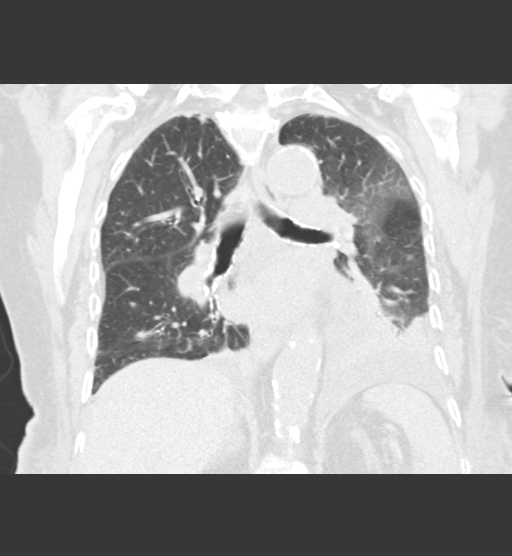

[15 of 36 positions shown; findings below may reference images not displayed]

FINDINGS: Bilateral small pleural effusions are noted with associated
compressive atelectasis, left greater than right lower lobe. Mild
central bronchial wall thickening is noted. Central airways are
patent. No pulmonary nodule or mass.

Heart size is mildly enlarged. Moderate pericardial effusion is
present. Moderate atheromatous aortic calcification without
aneurysm. Small mediastinal nodes are identified, largest in the
pretracheal region measuring 0.6 cm image 18. Imaging of the upper
abdomen is degraded by respiratory motion. Great vessels are normal
in caliber. Remote left lateral second, third, fourth, fifth, sixth
rib fractures are identified. No acute osseous abnormality. T4
compression deformity reidentified.
IMPRESSION: Small bilateral pleural effusions and associated compressive
atelectasis, left greater than right, accounting for the findings on
prior CT. Early pneumonia could appear similar, however.

Mild cardiomegaly with moderate pericardial effusion.

## 2016-03-02 ENCOUNTER — Other Ambulatory Visit: Payer: Self-pay | Admitting: *Deleted

## 2016-03-02 DIAGNOSIS — Z7901 Long term (current) use of anticoagulants: Secondary | ICD-10-CM

## 2016-03-02 DIAGNOSIS — I10 Essential (primary) hypertension: Secondary | ICD-10-CM

## 2016-03-02 DIAGNOSIS — R609 Edema, unspecified: Secondary | ICD-10-CM

## 2016-03-02 DIAGNOSIS — I4821 Permanent atrial fibrillation: Secondary | ICD-10-CM

## 2016-03-02 DIAGNOSIS — I5032 Chronic diastolic (congestive) heart failure: Secondary | ICD-10-CM

## 2016-03-02 DIAGNOSIS — I701 Atherosclerosis of renal artery: Secondary | ICD-10-CM

## 2016-03-02 DIAGNOSIS — N189 Chronic kidney disease, unspecified: Secondary | ICD-10-CM

## 2016-03-02 MED ORDER — FUROSEMIDE 20 MG PO TABS: ORAL_TABLET | ORAL | 6 refills | Status: DC

## 2016-03-30 ENCOUNTER — Telehealth: Payer: Self-pay | Admitting: Cardiovascular Disease

## 2016-03-30 NOTE — Telephone Encounter (Signed)
Husband [name]) notified that her Coreg should be 12.5mg  every morning & 6.25mg  every evening per last notes in May.

## 2016-04-03 ENCOUNTER — Telehealth: Payer: Self-pay | Admitting: Cardiovascular Disease

## 2016-04-03 NOTE — Telephone Encounter (Signed)
Caregiver called stating that [name] fell during the night. Very fatigue and weakness.  Productive cough for a week now. BP 120/108 (845am) and pulse 90. Patient is lethargic.

## 2016-04-03 NOTE — Telephone Encounter (Signed)
Fell over off potty chair last evening, did not hit head or get any bruises anywhere.  Fatigue & weakness seems to be more than usual for her.  C/O hacking cough x 2 weeks, non productive.  No fever.  Caregiver (Ms. Mosetta Anis) concerned over BP.  Asked patient to take BP again & cal back after patient done with bath time.  Suggest having her sit still for 5-10 minutes prior to taking.

## 2016-04-08 NOTE — Telephone Encounter (Addendum)
Caregiver [facility] [employer]) called back with concerns over heart rate - end of her shift so she checked her before leaving.  HR with BP monitor was 46.  HR with pulse ox was 51.  Explained that those readings may not always be accurate with her AF.  Better way to check would be manual, but still may be difficult with AF.  Also, depends on how she feels.  Any dizziness, lightheadedness, or fatigue that is more than usual for her.  Advised to start today, for the next 2 weeks to log BP & HR and call / return to office for review.  Phone call on 03/30/16 was husband questioning her dose of the Coreg.  Per our chart, we had 12.5 in the am & 6.25 in the pm.  Caregiver thinks there has been a change with her since going back on that dose.  She thought that medication was at 6.25mg  twice a day & that PMD decreased it.  Caregiver does not administer her medications.  Patient's daughter prepares them for the week for her.  Caregiver stated that she is there every other day & sometimes she is not informed of changes.  Caregiver verbalized understanding.

## 2016-04-08 NOTE — Telephone Encounter (Addendum)
[name] (caregiver) returning call today.  Seems to be in better spirits today & doing a little better.  BP today - 95/55  58.    Stated that she monitors vitals every day.  Not eating much though.  Suggested Ensure occasionally also.  She will call back if anything changes.

## 2016-04-08 NOTE — Telephone Encounter (Signed)
When I saw Ms. Selkirk she was exhibiting signs of failure to thrive and had been for some time. Agree with ensure. Please clarify what dose of carvedilol she's actually taking. If she is on 6.25mg , can try decreasing dose to 3.125mg  BID to see if that improves her HR/BP. Please also coach caregiver how to take manual pulse on wrist or placing hand on heart to get accurate reading. If decline continues may need OV with labs to determine if any recent changes (has a multitude of other issues that could be contributing including CKD, dementia). Encourage adequate oral intake. Dayna Dunn PA-C

## 2016-04-08 NOTE — Telephone Encounter (Signed)
No return call to present.  Attempted to return call, left message.

## 2016-04-09 NOTE — Telephone Encounter (Addendum)
[name] (husband) notified.  States she is currently on Coreg 12.5mg  every morning & 6.25mg  every evening.  Doing good today, sitting up eating sandwich.  HR 56.  Will continue to monitor for now & call back if symptoms change.

## 2016-04-16 ENCOUNTER — Ambulatory Visit (INDEPENDENT_AMBULATORY_CARE_PROVIDER_SITE_OTHER): Payer: Medicare Other | Admitting: *Deleted

## 2016-04-16 DIAGNOSIS — Z7901 Long term (current) use of anticoagulants: Secondary | ICD-10-CM | POA: Diagnosis not present

## 2016-04-16 DIAGNOSIS — I4891 Unspecified atrial fibrillation: Secondary | ICD-10-CM | POA: Diagnosis not present

## 2016-04-16 DIAGNOSIS — Z5181 Encounter for therapeutic drug level monitoring: Secondary | ICD-10-CM

## 2016-04-16 LAB — POCT INR: INR: 3.1

## 2016-04-24 ENCOUNTER — Other Ambulatory Visit: Payer: Self-pay | Admitting: Cardiovascular Disease

## 2016-04-28 ENCOUNTER — Other Ambulatory Visit: Payer: Self-pay | Admitting: Cardiovascular Disease

## 2016-04-28 DIAGNOSIS — I701 Atherosclerosis of renal artery: Secondary | ICD-10-CM

## 2016-04-28 DIAGNOSIS — Z7901 Long term (current) use of anticoagulants: Secondary | ICD-10-CM

## 2016-04-28 DIAGNOSIS — I4821 Permanent atrial fibrillation: Secondary | ICD-10-CM

## 2016-04-28 DIAGNOSIS — I5032 Chronic diastolic (congestive) heart failure: Secondary | ICD-10-CM

## 2016-04-28 DIAGNOSIS — R609 Edema, unspecified: Secondary | ICD-10-CM

## 2016-04-28 DIAGNOSIS — N189 Chronic kidney disease, unspecified: Secondary | ICD-10-CM

## 2016-04-28 DIAGNOSIS — I10 Essential (primary) hypertension: Secondary | ICD-10-CM

## 2016-04-30 HISTORY — PX: PACEMAKER IMPLANT: EP1218

## 2016-05-11 ENCOUNTER — Other Ambulatory Visit: Payer: Self-pay | Admitting: Cardiovascular Disease

## 2016-05-12 ENCOUNTER — Ambulatory Visit: Payer: Medicare Other | Admitting: Cardiovascular Disease

## 2016-05-28 ENCOUNTER — Ambulatory Visit (INDEPENDENT_AMBULATORY_CARE_PROVIDER_SITE_OTHER): Payer: Medicare Other | Admitting: *Deleted

## 2016-05-28 DIAGNOSIS — Z7901 Long term (current) use of anticoagulants: Secondary | ICD-10-CM

## 2016-05-28 DIAGNOSIS — I701 Atherosclerosis of renal artery: Secondary | ICD-10-CM

## 2016-05-28 DIAGNOSIS — I4891 Unspecified atrial fibrillation: Secondary | ICD-10-CM | POA: Diagnosis not present

## 2016-05-28 DIAGNOSIS — Z5181 Encounter for therapeutic drug level monitoring: Secondary | ICD-10-CM | POA: Diagnosis not present

## 2016-05-28 LAB — POCT INR: INR: 3.3

## 2016-06-23 ENCOUNTER — Ambulatory Visit (INDEPENDENT_AMBULATORY_CARE_PROVIDER_SITE_OTHER): Payer: Medicare Other | Admitting: *Deleted

## 2016-06-23 ENCOUNTER — Ambulatory Visit: Payer: Medicare Other | Admitting: Cardiovascular Disease

## 2016-06-23 DIAGNOSIS — Z5181 Encounter for therapeutic drug level monitoring: Secondary | ICD-10-CM | POA: Diagnosis not present

## 2016-06-23 DIAGNOSIS — I4891 Unspecified atrial fibrillation: Secondary | ICD-10-CM

## 2016-06-23 DIAGNOSIS — I701 Atherosclerosis of renal artery: Secondary | ICD-10-CM

## 2016-06-23 DIAGNOSIS — Z7901 Long term (current) use of anticoagulants: Secondary | ICD-10-CM | POA: Diagnosis not present

## 2016-06-23 LAB — POCT INR: INR: 3.1

## 2016-06-25 ENCOUNTER — Ambulatory Visit (INDEPENDENT_AMBULATORY_CARE_PROVIDER_SITE_OTHER): Payer: Medicare Other | Admitting: Physician Assistant

## 2016-06-25 ENCOUNTER — Encounter: Payer: Self-pay | Admitting: Physician Assistant

## 2016-06-25 VITALS — BP 124/80 | HR 63 | Ht 62.0 in | Wt 121.4 lb

## 2016-06-25 DIAGNOSIS — I5032 Chronic diastolic (congestive) heart failure: Secondary | ICD-10-CM

## 2016-06-25 DIAGNOSIS — I4821 Permanent atrial fibrillation: Secondary | ICD-10-CM

## 2016-06-25 DIAGNOSIS — R001 Bradycardia, unspecified: Secondary | ICD-10-CM

## 2016-06-25 DIAGNOSIS — I10 Essential (primary) hypertension: Secondary | ICD-10-CM

## 2016-06-25 DIAGNOSIS — I482 Chronic atrial fibrillation: Secondary | ICD-10-CM

## 2016-06-25 DIAGNOSIS — I701 Atherosclerosis of renal artery: Secondary | ICD-10-CM | POA: Diagnosis not present

## 2016-06-25 NOTE — Progress Notes (Signed)
Cardiology Office Note    Date:  06/25/2016   ID:  [name], DOB 03/04/1929, MRN [id number]  PCP:  Ignatius Speckinghruv B Vyas, MD  Cardiologist: Dr. Purvis SheffieldKoneswaran  Chief Complaint  Patient presents with  . Hospitalization Follow-up    History of Present Illness:  [name] is a 82 y.o. female patient with history of chronic diastolic heart failure, hypertension, CKD with mild right renal artery stenosis 1-59% in 2014, atrial fibrillation on Coumadin. She had a 2-D echo 09/04/14 normal LVEF 55-60% with mild aortic and mitral and moderate tricuspid regurgitation. Moderate size circumferential pericardial effusion.  Patient comes in today accompanied by her husband and friend. 2 months ago she became unresponsive and her husband did CPR and her for 15-20 minutes. She was taken to the hospital in [place] and received a pacemaker by Dr. Georganna Skeanspainter. Her husband says her heart rate was down in the 20s. They don't think she had an MI, heart failure or any other problems. She's got a lot more energy since her pacemaker insertion. She does have a degree of dementia and uses a rolling seated walker. She is very unsteady on her feet. Denies chest pain, palpitations, dyspnea, dyspnea on exertion, dizziness or presyncope. No edema.    Past Medical History:  Diagnosis Date  . Chronic atrial fibrillation (HCC)   . Chronic diastolic CHF (congestive heart failure) (HCC)   . CKD (chronic kidney disease), stage III   . Coronary artery disease   . Dementia   . Encounter for long-term (current) use of other medications    COUMADIN THERAPY  . Essential hypertension   . GERD (gastroesophageal reflux disease)   . Lower extremity weakness   . Pericardial effusion    a. Echo 08/2014: mod pericardial effusion.  . Renal artery stenosis (HCC)    a. right renal artery stenosis (1-59% by duplex in 04/2013).  . Sinoatrial node dysfunction (HCC)     SINUS BRADYCARDIA  . Sinus bradycardia     Past  Surgical History:  Procedure Laterality Date  . BACK SURGERY    . CARDIOVERSION     x2  . CHOLECYSTECTOMY    . COLONOSCOPY  05/2011   Dr. Allena KatzPatel: normal. no bx.  Marland Kitchen. KNEE ARTHROSCOPY      Current Medications: Outpatient Medications Prior to Visit  Medication Sig Dispense Refill  . ALPRAZolam (XANAX) 0.5 MG tablet Take 0.5 mg by mouth at bedtime as needed for anxiety or sleep.     . Biotin 5000 MCG TABS Take 1 tablet by mouth daily. 10,000 mcg daily    . Calcium Carbonate-Vitamin D (CALTRATE 600+D PO) Take 1 tablet by mouth 2 (two) times daily.     . carvedilol (COREG) 12.5 MG tablet TAKE 1 TABLET BY MOUTH EVERY MORNING AND ONE-HALF TABLET BY MOUTH EVERY EVENING 45 tablet 3  . Cyanocobalamin (VITAMIN B-12 PO) Take 1 tablet by mouth daily.    Marland Kitchen. diltiazem (CARDIZEM) 90 MG tablet TAKE 1 TABLET BY MOUTH TWICE DAILY 60 tablet 6  . donepezil (ARICEPT) 10 MG tablet Take 1 tablet by mouth at bedtime.  6  . feeding supplement, ENSURE COMPLETE, (ENSURE COMPLETE) LIQD Take 237 mLs by mouth 3 (three) times daily between meals. 90 Bottle 1  . furosemide (LASIX) 20 MG tablet TAKE LASIX 20 MG A DAY ALTERNATING WITH LASIX 40 MG A DAY 45 tablet 6  . HYDROcodone-acetaminophen (NORCO/VICODIN) 5-325 MG per tablet Take 1 tablet by mouth every 8 (eight) hours as needed  for moderate pain.    . memantine (NAMENDA) 10 MG tablet Take 1 tablet by mouth 2 (two) times daily.  3  . mirtazapine (REMERON) 15 MG tablet Take 1 tablet by mouth at bedtime.  5  . Multiple Vitamin (MULTIVITAMIN) tablet Take 1 tablet by mouth daily.     . potassium chloride (K-DUR) 10 MEQ tablet Take 1 tablet (10 mEq total) by mouth daily. 30 tablet 0  . warfarin (COUMADIN) 2 MG tablet TAKE 2 TABLETS BY MOUTH ON TUESDAYS, THURSDAYS, AND SATURDAYS. TAKE 1 TABLET ALL OTHER DAYS 45 tablet 1   No facility-administered medications prior to visit.      Allergies:   Patient has no known allergies.   Social History   Social History  . Marital  status: Married    Spouse name: [name]  . Number of children: 0  . Years of education: 12   Occupational History  . RETIRED   .  Retired   Social History Main Topics  . Smoking status: Never Smoker  . Smokeless tobacco: Never Used     Comment: Never smoked  . Alcohol use No  . Drug use: No  . Sexual activity: Not Currently   Other Topics Concern  . None   Social History Narrative   Patient lives at home with her husband [name]) Patient is retired. Patient has 12 th grade education.   Right handed.   Caffeine- two three cups of coffee daily.     Family History:  The patient's   family history includes Cancer in her brother; Cirrhosis in her other; Heart failure in her mother; High blood pressure in her mother.   ROS:   Please see the history of present illness.    Review of Systems  Constitution: Positive for weakness and malaise/fatigue.  Neurological: Positive for difficulty with concentration, disturbances in coordination and loss of balance.       Memory loss   All other systems reviewed and are negative.   PHYSICAL EXAM:   VS:  BP 124/80   Pulse 63   Ht 5\' 2"  (1.575 m)   Wt 121 lb 6.4 oz (55.1 kg)   BMI 22.20 kg/m   Physical Exam  GEN: Elderly, in no acute distress  Neck: no JVD, carotid bruits, or masses Cardiac: Irregular irregular with 2/6 systolic murmur at the left sternal border, no rubs, or gallops  Respiratory:  clear to auscultation bilaterally, normal work of breathing GI: soft, nontender, nondistended, + BS Ext: without cyanosis, clubbing, or edema, Good distal pulses bilaterally MS: no deformity or atrophy  Skin: warm and dry, no rash    Wt Readings from Last 3 Encounters:  06/25/16 121 lb 6.4 oz (55.1 kg)  12/16/15 121 lb (54.9 kg)  07/03/15 119 lb (54 kg)      Studies/Labs Reviewed:   EKG:  EKG is  ordered today.  The ekg ordered today demonstrates Underlying atrial fibrillation with some pacing  Recent Labs: 06/28/2015: B  Natriuretic Peptide 571.0; BUN 37; Creatinine, Ser 1.18; Hemoglobin 15.5; Platelets 196; Potassium 4.1; Sodium 140   Lipid Panel No results found for: CHOL, TRIG, HDL, CHOLHDL, VLDL, LDLCALC, LDLDIRECT  Additional studies/ records that were reviewed today include:  2-D echo 1/2016Study Conclusions  - Left ventricle: The cavity size was normal. Wall thickness was   increased in a pattern of mild LVH. Systolic function was normal.   The estimated ejection fraction was in the range of 55% to 60%. - Regional wall motion  abnormality: Hypokinesis of the basal-mid   anteroseptal myocardium. - Aortic valve: Moderately calcified annulus. Trileaflet;   moderately thickened leaflets. There was mild regurgitation.   Valve area (VTI): 1.42 cm^2. - Mitral valve: Moderately calcified annulus. Moderately thickened   leaflets . There was mild regurgitation. The MR vena contracta is   0.2 cm. - Left atrium: The atrium was severely dilated. - Right ventricle: The cavity size was mildly dilated. Systolic   function was mildly reduced. RV TAPSE is 1.3 cm. - Right atrium: The atrium was moderately dilated. - Tricuspid valve: There was moderate regurgitation. - Pulmonary arteries: Systolic pressure was moderately increased.   PA peak pressure: 62 mm Hg (S). - Inferior vena cava: The vessel was dilated. The respirophasic   diameter changes were blunted (< 50%), consistent with elevated   central venous pressure. - Pericardium, extracardiac: There is a large left pleural   effusion. There is a moderate circumferential pericardial   effusion. It measures 1.5 cm adjacent to the RV free wall in   diastole. There is no evidence of tamponade physiology by echo. - Technically adequate study.  Transthoracic echocardiography.  M-mode, complete 2D, spectral Doppler, and color Doppler.  Birthdate:  Patient birthdate: 12-11-1928.  Age:  Patient is 82 yr old.  Sex:  Gender: female. BMI: 21.6 kg/m^2.  Blood  pressure:     119/69  Patient status: Inpatient.  Study date:  Study date: 09/04/2014. Study time: 09:48 AM.  Location:  Bedside.  Mild right renal artery stenosis 1-59% 04/17/13   ASSESSMENT:    1. Bradycardia   2. Permanent atrial fibrillation (HCC)   3. Essential hypertension   4. Renal artery stenosis (HCC)   5. Chronic diastolic heart failure (HCC)      PLAN:  In order of problems listed above:  Bradycardia requiring CPR and pacemaker insertion at [facility] 2 months ago. We'll obtain records. F/U with Dr. Purvis Sheffield in 2 months.  Permanent atrial fibrillation with controlled rate on Coreg and diltiazem  Essential hypertension well controlled  History of renal artery stenosis last checked in 2014 recommend Dopplers per Dr.Koneswaran  Chronic diastolic heart failure well compensated    Medication Adjustments/Labs and Tests Ordered: Current medicines are reviewed at length with the patient today.  Concerns regarding medicines are outlined above.  Medication changes, Labs and Tests ordered today are listed in the Patient Instructions below. Patient Instructions  Your physician recommends that you schedule a follow-up appointment in: 2-3 Months with Dr. Purvis Sheffield   Your physician recommends that you continue on your current medications as directed. Please refer to the Current Medication list given to you today.  Your physician has requested that you have a renal artery duplex. During this test, an ultrasound is used to evaluate blood flow to the kidneys. Allow one hour for this exam. Do not eat after midnight the day before and avoid carbonated beverages. Take your medications as you usually do.  If you need a refill on your cardiac medications before your next appointment, please call your pharmacy.  Thank you for choosing [facility]!       Elson Clan, PA-C  06/25/2016 3:40 PM    [facility] [address]  [place], [place], [place]  [address] Phone: [phone]; Fax: [phone]

## 2016-06-25 NOTE — Patient Instructions (Signed)
Your physician recommends that you schedule a follow-up appointment in: 2-3 Months with Dr. Purvis Sheffield   Your physician recommends that you continue on your current medications as directed. Please refer to the Current Medication list given to you today.  Your physician has requested that you have a renal artery duplex. During this test, an ultrasound is used to evaluate blood flow to the kidneys. Allow one hour for this exam. Do not eat after midnight the day before and avoid carbonated beverages. Take your medications as you usually do.  If you need a refill on your cardiac medications before your next appointment, please call your pharmacy.  Thank you for choosing [facility]!

## 2016-06-28 ENCOUNTER — Other Ambulatory Visit: Payer: Self-pay | Admitting: Cardiovascular Disease

## 2016-07-16 ENCOUNTER — Ambulatory Visit (INDEPENDENT_AMBULATORY_CARE_PROVIDER_SITE_OTHER): Payer: Medicare Other | Admitting: *Deleted

## 2016-07-16 ENCOUNTER — Other Ambulatory Visit: Payer: Self-pay | Admitting: Cardiovascular Disease

## 2016-07-16 DIAGNOSIS — Z5181 Encounter for therapeutic drug level monitoring: Secondary | ICD-10-CM | POA: Diagnosis not present

## 2016-07-16 DIAGNOSIS — N189 Chronic kidney disease, unspecified: Secondary | ICD-10-CM

## 2016-07-16 DIAGNOSIS — I701 Atherosclerosis of renal artery: Secondary | ICD-10-CM | POA: Diagnosis not present

## 2016-07-16 DIAGNOSIS — I4821 Permanent atrial fibrillation: Secondary | ICD-10-CM

## 2016-07-16 DIAGNOSIS — R609 Edema, unspecified: Secondary | ICD-10-CM

## 2016-07-16 DIAGNOSIS — I4891 Unspecified atrial fibrillation: Secondary | ICD-10-CM | POA: Diagnosis not present

## 2016-07-16 DIAGNOSIS — I10 Essential (primary) hypertension: Secondary | ICD-10-CM

## 2016-07-16 DIAGNOSIS — Z7901 Long term (current) use of anticoagulants: Secondary | ICD-10-CM

## 2016-07-16 DIAGNOSIS — I5032 Chronic diastolic (congestive) heart failure: Secondary | ICD-10-CM

## 2016-07-16 LAB — POCT INR: INR: 2.5

## 2016-08-18 ENCOUNTER — Ambulatory Visit (INDEPENDENT_AMBULATORY_CARE_PROVIDER_SITE_OTHER): Payer: Medicare Other | Admitting: *Deleted

## 2016-08-18 DIAGNOSIS — Z5181 Encounter for therapeutic drug level monitoring: Secondary | ICD-10-CM | POA: Diagnosis not present

## 2016-08-18 LAB — POCT INR: INR: 3.2

## 2016-08-31 ENCOUNTER — Encounter: Payer: Self-pay | Admitting: Cardiovascular Disease

## 2016-08-31 ENCOUNTER — Ambulatory Visit (INDEPENDENT_AMBULATORY_CARE_PROVIDER_SITE_OTHER): Payer: Medicare Other | Admitting: Cardiovascular Disease

## 2016-08-31 VITALS — BP 144/82 | HR 96 | Ht 62.0 in | Wt 124.8 lb

## 2016-08-31 DIAGNOSIS — I10 Essential (primary) hypertension: Secondary | ICD-10-CM

## 2016-08-31 DIAGNOSIS — Z950 Presence of cardiac pacemaker: Secondary | ICD-10-CM

## 2016-08-31 DIAGNOSIS — I5032 Chronic diastolic (congestive) heart failure: Secondary | ICD-10-CM

## 2016-08-31 DIAGNOSIS — N183 Chronic kidney disease, stage 3 unspecified: Secondary | ICD-10-CM

## 2016-08-31 DIAGNOSIS — I482 Chronic atrial fibrillation: Secondary | ICD-10-CM | POA: Diagnosis not present

## 2016-08-31 DIAGNOSIS — R609 Edema, unspecified: Secondary | ICD-10-CM

## 2016-08-31 DIAGNOSIS — I701 Atherosclerosis of renal artery: Secondary | ICD-10-CM | POA: Diagnosis not present

## 2016-08-31 DIAGNOSIS — R001 Bradycardia, unspecified: Secondary | ICD-10-CM

## 2016-08-31 DIAGNOSIS — I5033 Acute on chronic diastolic (congestive) heart failure: Secondary | ICD-10-CM

## 2016-08-31 DIAGNOSIS — Z7901 Long term (current) use of anticoagulants: Secondary | ICD-10-CM

## 2016-08-31 DIAGNOSIS — I4821 Permanent atrial fibrillation: Secondary | ICD-10-CM

## 2016-08-31 MED ORDER — FUROSEMIDE 20 MG PO TABS: 20.0000 mg | ORAL_TABLET | Freq: Every day | ORAL | Status: AC

## 2016-08-31 NOTE — Progress Notes (Signed)
SUBJECTIVE: The patient presents for follow-up of atrial fibrillation, chronic diastolic heart failure, pacemaker, and hypertension in the context of chronic kidney disease.  She is feeling very well and denies chest pain, palpitations, and shortness of breath. Has mild leg swelling. Her weight is up a few lbs from baseline.  Echocardiogram 09/04/14 showed normal left ventricular systolic function, EF 55-60%, mild aortic and mitral and moderate tricuspid regurgitation, with a moderate size circumferential pericardial effusion.   Review of Systems: As per "subjective", otherwise negative.  No Known Allergies  Current Outpatient Prescriptions  Medication Sig Dispense Refill  . ALPRAZolam (XANAX) 0.5 MG tablet Take 0.5 mg by mouth at bedtime as needed for anxiety or sleep.     . Biotin 5000 MCG TABS Take 1 tablet by mouth daily. 10,000 mcg daily    . Calcium Carbonate-Vitamin D (CALTRATE 600+D PO) Take 1 tablet by mouth 2 (two) times daily.     . carvedilol (COREG) 12.5 MG tablet TAKE 1 TABLET BY MOUTH EVERY MORNING AND ONE-HALF TABLET BY MOUTH EVERY EVENING 45 tablet 3  . Cyanocobalamin (VITAMIN B-12 PO) Take 1 tablet by mouth daily.    Marland Kitchen diltiazem (CARDIZEM) 90 MG tablet TAKE 1 TABLET BY MOUTH TWICE DAILY 60 tablet 6  . donepezil (ARICEPT) 10 MG tablet Take 1 tablet by mouth at bedtime.  6  . feeding supplement, ENSURE COMPLETE, (ENSURE COMPLETE) LIQD Take 237 mLs by mouth 3 (three) times daily between meals. 90 Bottle 1  . furosemide (LASIX) 20 MG tablet TAKE LASIX 20 MG A DAY ALTERNATING WITH LASIX 40 MG A DAY 45 tablet 6  . HYDROcodone-acetaminophen (NORCO/VICODIN) 5-325 MG per tablet Take 1 tablet by mouth every 8 (eight) hours as needed for moderate pain.    . mirtazapine (REMERON) 15 MG tablet Take 1 tablet by mouth at bedtime.  5  . Multiple Vitamin (MULTIVITAMIN) tablet Take 1 tablet by mouth daily.     Marland Kitchen NAMZARIC 28-10 MG CP24 take one capsule by mouth once daily  12  .  omeprazole (PRILOSEC) 20 MG capsule Take 20 mg by mouth daily.    . ondansetron (ZOFRAN) 4 MG tablet Take 4 mg by mouth every 8 (eight) hours as needed for nausea or vomiting.    . potassium chloride (K-DUR) 10 MEQ tablet Take 1 tablet (10 mEq total) by mouth daily. 30 tablet 0  . warfarin (COUMADIN) 2 MG tablet Take 1 tablet by mouth as directed.  3   No current facility-administered medications for this visit.     Past Medical History:  Diagnosis Date  . Chronic atrial fibrillation (HCC)   . Chronic diastolic CHF (congestive heart failure) (HCC)   . CKD (chronic kidney disease), stage III   . Coronary artery disease   . Dementia   . Encounter for long-term (current) use of other medications    COUMADIN THERAPY  . Essential hypertension   . GERD (gastroesophageal reflux disease)   . Lower extremity weakness   . Pericardial effusion    a. Echo 08/2014: mod pericardial effusion.  . Renal artery stenosis (HCC)    a. right renal artery stenosis (1-59% by duplex in 04/2013).  . Sinoatrial node dysfunction (HCC)     SINUS BRADYCARDIA  . Sinus bradycardia     Past Surgical History:  Procedure Laterality Date  . BACK SURGERY    . CARDIOVERSION     x2  . CHOLECYSTECTOMY    . COLONOSCOPY  05/2011  Dr. Allena Katz: normal. no bx.  Marland Kitchen KNEE ARTHROSCOPY      Social History   Social History  . Marital status: Married    Spouse name: [name]  . Number of children: 0  . Years of education: 12   Occupational History  . RETIRED   .  Retired   Social History Main Topics  . Smoking status: Never Smoker  . Smokeless tobacco: Never Used     Comment: Never smoked  . Alcohol use No  . Drug use: No  . Sexual activity: Not Currently   Other Topics Concern  . Not on file   Social History Narrative   Patient lives at home with her husband [name]) Patient is retired. Patient has 12 th grade education.   Right handed.   Caffeine- two three cups of coffee daily.     Vitals:   08/31/16  1402  BP: (!) 144/82  Pulse: 96  Weight: 124 lb 12.8 oz (56.6 kg)  Height: 5\' 2"  (1.575 m)    PHYSICAL EXAM General: NAD  Neck: No JVD, no thyromegaly.  Lungs: No rales. CV: Regular rate and rhythm, normal S1/S2, no S3, II/VI pansystolic murmur along left sternal border. Trace pretibial edema b/l. Venous varicosities b/l. Abdomen: Soft, nontender, no distention.  Neurologic: Alert and oriented.  Psych: Normal affect.  Skin: Normal. Musculoskeletal: No gross deformities. Extremities: No clubbing or cyanosis.     ECG: Most recent ECG reviewed.      ASSESSMENT AND PLAN:  Essential hypertension BP is reasonably controlled for age. No changes.  Peripheral edema  She has had exacerbations of chronic kidney disease in the past, and has right renal artery stenosis. Weight is slightly elevated at 124 lbs. BP is reasonably controlled for age. Instructed to take Lasix 40 mg daily for the next 3 days and then reduce to 20 mg daily. If her weight increases again in one week I will go back to having her take 40 mg and 20 mg on alternate days. She is already on supplemental potassium.  Chronic renal insufficiency/Right renal artery stenosis  Repeat renal Dopplers possibly later this year.  Atrial fibrillation  HR controlled on Coreg and diltiazem and is symptomatically stable. On warfarin for anticoagulation, followed here in our clinic.  Acute on chronic diastolic heart failure  Weight is slightly elevated,124 lbs. Taking Lasix 20 mg daily. Instructed to take Lasix 40 mg daily for the next 3 days and then reduce to 20 mg daily. If her weight increases again in one week I will go back to having her take 40 mg and 20 mg on alternate days. She is already on supplemental potassium.  Pacemaker Will enroll in device clinic for routine surveillance monitoring. Will obtain records from [facility].  Dispo: f/u 4 months.  Prentice Docker, M.D.,  F.A.C.C.

## 2016-08-31 NOTE — Patient Instructions (Addendum)
Medication Instructions:   Increase Lasix to 40mg  daily x 3 days, then back to previous 20mg  daily.  Continue all other medications.    Labwork: none  Testing/Procedures: none  Follow-Up: Your physician wants you to follow up in:  4 months.  You will receive a reminder letter in the mail one-two months in advance.  If you don't receive a letter, please call our office to schedule the follow up appointment   Any Other Special Instructions Will Be Listed Below (If Applicable). Enroll in device clinic.   If you need a refill on your cardiac medications before your next appointment, please call your pharmacy.

## 2016-08-31 NOTE — Addendum Note (Signed)
Addended by: Lesle Chris on: 08/31/2016 02:42 PM   Modules accepted: Orders

## 2016-09-08 ENCOUNTER — Ambulatory Visit (INDEPENDENT_AMBULATORY_CARE_PROVIDER_SITE_OTHER): Payer: Medicare Other | Admitting: *Deleted

## 2016-09-08 DIAGNOSIS — Z5181 Encounter for therapeutic drug level monitoring: Secondary | ICD-10-CM | POA: Diagnosis not present

## 2016-09-08 DIAGNOSIS — I701 Atherosclerosis of renal artery: Secondary | ICD-10-CM

## 2016-09-08 LAB — POCT INR: INR: 2.7

## 2016-09-16 ENCOUNTER — Other Ambulatory Visit: Payer: Self-pay | Admitting: Cardiovascular Disease

## 2016-10-01 ENCOUNTER — Other Ambulatory Visit (HOSPITAL_COMMUNITY)
Admission: RE | Admit: 2016-10-01 | Discharge: 2016-10-01 | Disposition: A | Discharge status: Home / Self Care | Payer: Medicare Other | Source: Ambulatory Visit | Attending: Adult Health | Admitting: Adult Health

## 2016-10-01 ENCOUNTER — Ambulatory Visit (HOSPITAL_COMMUNITY)
Admission: RE | Admit: 2016-10-01 | Discharge: 2016-10-01 | Disposition: A | Discharge status: Home / Self Care | Payer: Medicare Other | Source: Ambulatory Visit | Attending: Adult Health | Admitting: Adult Health

## 2016-10-01 ENCOUNTER — Ambulatory Visit (INDEPENDENT_AMBULATORY_CARE_PROVIDER_SITE_OTHER): Payer: Medicare Other | Admitting: Adult Health

## 2016-10-01 ENCOUNTER — Encounter: Payer: Self-pay | Admitting: Adult Health

## 2016-10-01 VITALS — BP 106/60 | HR 67 | Ht 63.0 in | Wt 124.0 lb

## 2016-10-01 DIAGNOSIS — R0602 Shortness of breath: Secondary | ICD-10-CM | POA: Diagnosis present

## 2016-10-01 DIAGNOSIS — R531 Weakness: Secondary | ICD-10-CM | POA: Insufficient documentation

## 2016-10-01 DIAGNOSIS — I4891 Unspecified atrial fibrillation: Secondary | ICD-10-CM | POA: Diagnosis not present

## 2016-10-01 DIAGNOSIS — I350 Nonrheumatic aortic (valve) stenosis: Secondary | ICD-10-CM | POA: Insufficient documentation

## 2016-10-01 DIAGNOSIS — I482 Chronic atrial fibrillation, unspecified: Secondary | ICD-10-CM

## 2016-10-01 DIAGNOSIS — I701 Atherosclerosis of renal artery: Secondary | ICD-10-CM

## 2016-10-01 DIAGNOSIS — R001 Bradycardia, unspecified: Secondary | ICD-10-CM | POA: Diagnosis not present

## 2016-10-01 DIAGNOSIS — I081 Rheumatic disorders of both mitral and tricuspid valves: Secondary | ICD-10-CM | POA: Insufficient documentation

## 2016-10-01 DIAGNOSIS — I10 Essential (primary) hypertension: Secondary | ICD-10-CM | POA: Insufficient documentation

## 2016-10-01 LAB — BASIC METABOLIC PANEL
Anion gap: 9 (ref 5–15)
BUN: 25 mg/dL — AB (ref 6–20)
CO2: 31 mmol/L (ref 22–32)
CREATININE: 1.41 mg/dL — AB (ref 0.44–1.00)
Calcium: 9.2 mg/dL (ref 8.9–10.3)
Chloride: 101 mmol/L (ref 101–111)
GFR calc non Af Amer: 33 mL/min — ABNORMAL LOW (ref >60)
GFR, EST AFRICAN AMERICAN: 38 mL/min — AB (ref >60)
Glucose, Bld: 151 mg/dL — ABNORMAL HIGH (ref 65–99)
POTASSIUM: 3.6 mmol/L (ref 3.5–5.1)
SODIUM: 141 mmol/L (ref 135–145)

## 2016-10-01 LAB — CBC WITH DIFFERENTIAL/PLATELET
BASOS ABS: 0.1 10*3/uL (ref 0.0–0.1)
BASOS PCT: 1 %
EOS ABS: 0.1 10*3/uL (ref 0.0–0.7)
EOS PCT: 1 %
HCT: 41 % (ref 36.0–46.0)
HEMOGLOBIN: 13.3 g/dL (ref 12.0–15.0)
LYMPHS ABS: 1.2 10*3/uL (ref 0.7–4.0)
Lymphocytes Relative: 17 %
MCH: 30 pg (ref 26.0–34.0)
MCHC: 32.4 g/dL (ref 30.0–36.0)
MCV: 92.6 fL (ref 78.0–100.0)
Monocytes Absolute: 0.6 10*3/uL (ref 0.1–1.0)
Monocytes Relative: 9 %
NEUTROS PCT: 72 %
Neutro Abs: 5.4 10*3/uL (ref 1.7–7.7)
PLATELETS: 206 10*3/uL (ref 150–400)
RBC: 4.43 MIL/uL (ref 3.87–5.11)
RDW: 16.5 % — ABNORMAL HIGH (ref 11.5–15.5)
WBC: 7.4 10*3/uL (ref 4.0–10.5)

## 2016-10-01 LAB — ECHOCARDIOGRAM COMPLETE
HEIGHTINCHES: 63 in
WEIGHTICAEL: 1984 [oz_av]

## 2016-10-01 LAB — PROTIME-INR
INR: 2.33
PROTHROMBIN TIME: 26 s — AB (ref 11.4–15.2)

## 2016-10-01 NOTE — Progress Notes (Signed)
Cardiology Office Note   Date:  10/01/2016   ID:  [name], DOB Feb 16, 1929, MRN [id number]  PCP:  Ignatius Specking, MD  Cardiologist: Inis Sizer, NP   Chief Complaint  Patient presents with  . Atrial Fibrillation    History of Present Illness: [name] is a 83 y.o. female who presents for ongoing assessment and management of atrial fibrillation, chronic diastolic heart failure, pacemaker, hypertension, in the context of chronic kidney disease. The patient was last in the office on 08/31/2016. Patient's echocardiogram was completed in January 2016 revealing normal LV systolic function with mild aortic and mitral and moderate tricuspid regurg with moderate sized circumferential pericardial effusion  On last office visit with Dr. Purvis Sheffield on 08/31/2016, was found to have evidence of fluid overload with elevated weight, and was advised to take Lasix 40 mg daily for 3 days and then reduce to 20 mg daily. Ever await increased again in one week she was to go back to taking 40 mg and 20 mg alternating days. Continue take potassium supplement. She was also to be enrolled in our pacemaker clinic for ongoing evaluation and interrogation. She was to return to the office in 4 months.  She is here with her husband, due to complaints of decreasing O2 levels. She denies chest pain, dyspnea, or dizziness. She denies chest pain, bleeding or significant palpitations. She is not seen by pulmonologist.   Past Medical History:  Diagnosis Date  . Chronic atrial fibrillation (HCC)   . Chronic diastolic CHF (congestive heart failure) (HCC)   . CKD (chronic kidney disease), stage III   . Coronary artery disease   . Dementia   . Encounter for long-term (current) use of other medications    COUMADIN THERAPY  . Essential hypertension   . GERD (gastroesophageal reflux disease)   . Lower extremity weakness   . Pericardial effusion    a. Echo 08/2014: mod pericardial effusion.  .  Renal artery stenosis (HCC)    a. right renal artery stenosis (1-59% by duplex in 04/2013).  . Sinoatrial node dysfunction (HCC)     SINUS BRADYCARDIA  . Sinus bradycardia     Past Surgical History:  Procedure Laterality Date  . BACK SURGERY    . CARDIOVERSION     x2  . CHOLECYSTECTOMY    . COLONOSCOPY  05/2011   Dr. Allena Katz: normal. no bx.  Marland Kitchen KNEE ARTHROSCOPY       Current Outpatient Prescriptions  Medication Sig Dispense Refill  . Biotin 5000 MCG TABS Take 1 tablet by mouth daily. 10,000 mcg daily    . Calcium Carbonate-Vitamin D (CALTRATE 600+D PO) Take 1 tablet by mouth 2 (two) times daily.     . carvedilol (COREG) 12.5 MG tablet TAKE 1 TABLET BY MOUTH EVERY MORNING AND ONE-HALF TABLET BY MOUTH EVERY EVENING 45 tablet 3  . Cyanocobalamin (VITAMIN B-12 PO) Take 1 tablet by mouth daily.    Marland Kitchen diltiazem (CARDIZEM) 90 MG tablet TAKE 1 TABLET BY MOUTH TWICE DAILY 60 tablet 6  . donepezil (ARICEPT) 10 MG tablet Take 1 tablet by mouth at bedtime.  6  . feeding supplement, ENSURE COMPLETE, (ENSURE COMPLETE) LIQD Take 237 mLs by mouth 3 (three) times daily between meals. 90 Bottle 1  . furosemide (LASIX) 20 MG tablet Take 1 tablet (20 mg total) by mouth daily.    . mirtazapine (REMERON) 15 MG tablet Take 1 tablet by mouth at bedtime.  5  . Multiple Vitamin (MULTIVITAMIN)  tablet Take 1 tablet by mouth daily.     Marland Kitchen NAMZARIC 28-10 MG CP24 take one capsule by mouth once daily  12  . omeprazole (PRILOSEC) 20 MG capsule Take 20 mg by mouth daily.    . potassium chloride (K-DUR) 10 MEQ tablet Take 1 tablet (10 mEq total) by mouth daily. 30 tablet 0  . warfarin (COUMADIN) 2 MG tablet Take 1 tablet by mouth as directed.  3   No current facility-administered medications for this visit.     Allergies:   Patient has no known allergies.    Social History:  The patient  reports that she has never smoked. She has never used smokeless tobacco. She reports that she does not drink alcohol or use  drugs.   Family History:  The patient's family history includes Cancer in her brother; Cirrhosis in her other; Heart failure in her mother; High blood pressure in her mother.    ROS: All other systems are reviewed and negative. Unless otherwise mentioned in H&P    PHYSICAL EXAM: VS:  BP 106/60   Pulse 67   Ht 5\' 3"  (1.6 m)   Wt 124 lb (56.2 kg)   SpO2 (!) 89%   BMI 21.97 kg/m  , BMI Body mass index is 21.97 kg/m. GEN: Frail, in a wheelchair, O2 2 liters via [place] HEENT: normal  Neck: no JVD, carotid bruits, or masses Cardiac: IRRR; no murmurs, rubs, or gallops,no edema  Respiratory:  Clear to auscultation bilaterally, normal work of breathing GI: soft, nontender, nondistended, + BS MS: no deformity or atrophy  Skin: warm and dry, no rash Neuro:  Strength and sensation are intact Psych: euthymic mood, full affect  Recent Labs: 10/01/2016: BUN 25; Creatinine, Ser 1.41; Hemoglobin 13.3; Platelets 206; Potassium 3.6; Sodium 141    Lipid Panel No results found for: CHOL, TRIG, HDL, CHOLHDL, VLDL, LDLCALC, LDLDIRECT    Wt Readings from Last 3 Encounters:  10/01/16 124 lb (56.2 kg)  08/31/16 124 lb 12.8 oz (56.6 kg)  06/25/16 121 lb 6.4 oz (55.1 kg)      Other studies Reviewed:   ASSESSMENT AND PLAN:  1. Decreased O2: Was seen by PCP given lasix. She continues to have issues with low O2. Sat in the office is 89% on RA. I will have a CXR and blood work competed to include CBC and BMET. She may need referral to pulmonology for more intensive treatment. She is on coumadin and therefore I am not worried about PE.   2. Hypertension: BP is slightly elevated today. Continue current regimen. No changes at this time. She has not had echocardiogram since 2016. Will repeat.    Current medicines are reviewed at length with the patient today.    Labs/ tests ordered today include:   Orders Placed This Encounter  Procedures  . DG Chest 2 View  . CBC with Differential  . Basic  Metabolic Panel (BMET)  . INR/PT  . ECHOCARDIOGRAM COMPLETE     Disposition:   FU in [place].   Signed, Joni Reining, NP  10/01/2016 4:13 PM    [facility] [address], [place], [place] [address] Phone: [phone]; Fax: [phone]

## 2016-10-01 NOTE — Progress Notes (Signed)
*  PRELIMINARY RESULTS* Echocardiogram 2D Echocardiogram has been performed.  [name] 10/01/2016, 3:57 PM

## 2016-10-01 NOTE — Patient Instructions (Addendum)
Medication Instructions:  Your physician recommends that you continue on your current medications as directed. Please refer to the Current Medication list given to you today.   Labwork: Your physician recommends that you return for lab work in: TODAY   Testing/Procedures: Your physician has requested that you have an echocardiogram. Echocardiography is a painless test that uses sound waves to create images of your heart. It provides your doctor with information about the size and shape of your heart and how well your heart's chambers and valves are working. This procedure takes approximately one hour. There are no restrictions for this procedure.  A chest x-ray takes a picture of the organs and structures inside the chest, including the heart, lungs, and blood vessels. This test can show several things, including, whether the heart is enlarges; whether fluid is building up in the lungs; and whether pacemaker / defibrillator leads are still in place.   Follow-Up: Your physician recommends that you schedule a follow-up appointment in: 3 WEEKS IN [place]   Any Other Special Instructions Will Be Listed Below (If Applicable).     If you need a refill on your cardiac medications before your next appointment, please call your pharmacy.

## 2016-10-01 NOTE — Progress Notes (Signed)
Name: [name]    DOB: 13-Feb-1929  Age: 83 y.o.  MR#: [id number]       PCP:  Ignatius Specking, MD      Insurance: Payor: MEDICARE / Plan: MEDICARE PART A AND B / Product Type: *No Product type* /   CC:   No chief complaint on file.   VS Vitals:   10/01/16 1430  Pulse: 67  SpO2: (!) 89%  Weight: 124 lb (56.2 kg)  Height: 5\' 3"  (1.6 m)    Weights Current Weight  10/01/16 124 lb (56.2 kg)  08/31/16 124 lb 12.8 oz (56.6 kg)  06/25/16 121 lb 6.4 oz (55.1 kg)    Blood Pressure  BP Readings from Last 3 Encounters:  08/31/16 (!) 144/82  06/25/16 124/80  12/16/15 134/88     Admit date:  (Not on file) Last encounter with RMR:  Visit date not found   Allergy Patient has no known allergies.  Current Outpatient Prescriptions  Medication Sig Dispense Refill  . Biotin 5000 MCG TABS Take 1 tablet by mouth daily. 10,000 mcg daily    . Calcium Carbonate-Vitamin D (CALTRATE 600+D PO) Take 1 tablet by mouth 2 (two) times daily.     . carvedilol (COREG) 12.5 MG tablet TAKE 1 TABLET BY MOUTH EVERY MORNING AND ONE-HALF TABLET BY MOUTH EVERY EVENING 45 tablet 3  . Cyanocobalamin (VITAMIN B-12 PO) Take 1 tablet by mouth daily.    Marland Kitchen diltiazem (CARDIZEM) 90 MG tablet TAKE 1 TABLET BY MOUTH TWICE DAILY 60 tablet 6  . donepezil (ARICEPT) 10 MG tablet Take 1 tablet by mouth at bedtime.  6  . feeding supplement, ENSURE COMPLETE, (ENSURE COMPLETE) LIQD Take 237 mLs by mouth 3 (three) times daily between meals. 90 Bottle 1  . furosemide (LASIX) 20 MG tablet Take 1 tablet (20 mg total) by mouth daily.    . mirtazapine (REMERON) 15 MG tablet Take 1 tablet by mouth at bedtime.  5  . Multiple Vitamin (MULTIVITAMIN) tablet Take 1 tablet by mouth daily.     Marland Kitchen NAMZARIC 28-10 MG CP24 take one capsule by mouth once daily  12  . omeprazole (PRILOSEC) 20 MG capsule Take 20 mg by mouth daily.    . potassium chloride (K-DUR) 10 MEQ tablet Take 1 tablet (10 mEq total) by mouth daily. 30 tablet 0  . warfarin  (COUMADIN) 2 MG tablet Take 1 tablet by mouth as directed.  3   No current facility-administered medications for this visit.     Discontinued Meds:    Medications Discontinued During This Encounter  Medication Reason  . HYDROcodone-acetaminophen (NORCO/VICODIN) 5-325 MG per tablet Error  . ondansetron (ZOFRAN) 4 MG tablet Error  . ALPRAZolam (XANAX) 0.5 MG tablet Error    Patient Active Problem List   Diagnosis Date Noted  . Essential hypertension 07/02/2015  . Chronic atrial fibrillation (HCC) 07/02/2015  . CKD (chronic kidney disease), stage III 07/02/2015  . Renal artery stenosis (HCC) 07/02/2015  . Elevated transaminase level 01/18/2015  . FTT (failure to thrive) in adult 01/17/2015  . Hyperkalemia   . Protein-calorie malnutrition, severe (HCC) 09/04/2014  . Dementia 09/03/2014  . GERD without esophagitis 09/03/2014  . Anxiety 09/03/2014  . Loss of weight 09/03/2014  . Physical deconditioning 09/03/2014  . C. difficile diarrhea 05/11/2014  . C. difficile colitis 04/13/2014  . Chronic respiratory failure with hypoxia (HCC) 04/13/2014  . Chronic diarrhea 03/05/2014  . Encounter for therapeutic drug monitoring 09/15/2013  . Gait difficulty  05/18/2013  . Chronic diastolic heart failure (HCC) 02/17/2013  . Chronic renal insufficiency   . Peripheral edema 02/03/2013  . Dysphagia 01/12/2011  . Dilated cardiomyopathy (HCC) 11/04/2010  . Mitral regurgitation 11/04/2010  . PFO (patent foramen ovale) 11/04/2010  . Chronic anticoagulation 11/04/2010  . Long term (current) use of anticoagulants 10/30/2010  . COMPRESSION FRACTURE, SPINE 10/10/2010  . ATHEROSCLEROSIS OF RENAL ARTERY 09/04/2010  . HTN (hypertension) 05/27/2009  . SINUS BRADYCARDIA 05/27/2009    LABS    Component Value Date/Time   NA 140 06/28/2015 1200   NA 142 01/19/2015 0623   NA 141 01/18/2015 0300   K 4.1 06/28/2015 1200   K 4.5 01/19/2015 0623   K 4.6 01/18/2015 0300   CL 104 06/28/2015 1200   CL  107 01/19/2015 0623   CL 105 01/18/2015 0300   CO2 31 06/28/2015 1200   CO2 27 01/19/2015 0623   CO2 29 01/18/2015 0300   GLUCOSE 194 (H) 06/28/2015 1200   GLUCOSE 122 (H) 01/19/2015 0623   GLUCOSE 82 01/18/2015 0300   BUN 37 (H) 06/28/2015 1200   BUN 54 (H) 01/19/2015 0623   BUN 58 (H) 01/18/2015 0300   CREATININE 1.18 (H) 06/28/2015 1200   CREATININE 1.23 (H) 01/19/2015 0623   CREATININE 1.42 (H) 01/18/2015 0300   CREATININE 1.30 (H) 10/22/2014 1230   CREATININE 1.98 (H) 10/12/2014 1144   CREATININE 1.37 (H) 06/05/2014 1254   CALCIUM 9.8 06/28/2015 1200   CALCIUM 8.5 (L) 01/19/2015 0623   CALCIUM 8.8 (L) 01/18/2015 0300   GFRNONAA 41 (L) 06/28/2015 1200   GFRNONAA 39 (L) 01/19/2015 0623   GFRNONAA 33 (L) 01/18/2015 0300   GFRAA 48 (L) 06/28/2015 1200   GFRAA 46 (L) 01/19/2015 0623   GFRAA 38 (L) 01/18/2015 0300   CMP     Component Value Date/Time   NA 140 06/28/2015 1200   K 4.1 06/28/2015 1200   CL 104 06/28/2015 1200   CO2 31 06/28/2015 1200   GLUCOSE 194 (H) 06/28/2015 1200   BUN 37 (H) 06/28/2015 1200   CREATININE 1.18 (H) 06/28/2015 1200   CREATININE 1.30 (H) 10/22/2014 1230   CALCIUM 9.8 06/28/2015 1200   PROT 5.6 (L) 01/19/2015 0623   ALBUMIN 3.2 (L) 01/19/2015 0623   AST 35 01/19/2015 0623   ALT 43 01/19/2015 0623   ALKPHOS 108 01/19/2015 0623   BILITOT 0.8 01/19/2015 0623   GFRNONAA 41 (L) 06/28/2015 1200   GFRAA 48 (L) 06/28/2015 1200       Component Value Date/Time   WBC 8.0 06/28/2015 1200   WBC 10.5 01/17/2015 0820   WBC 6.9 10/05/2014 1535   HGB 15.5 (H) 06/28/2015 1200   HGB 14.4 01/17/2015 0820   HGB 12.4 10/05/2014 1535   HCT 48.3 (H) 06/28/2015 1200   HCT 46.5 (H) 01/17/2015 0820   HCT 39.9 10/05/2014 1535   MCV 89.1 06/28/2015 1200   MCV 82.2 01/17/2015 0820   MCV 81.6 10/05/2014 1535    Lipid Panel  No results found for: CHOL, TRIG, HDL, CHOLHDL, VLDL, LDLCALC, LDLDIRECT  ABG No results found for: PHART, PCO2ART, PO2ART, HCO3,  TCO2, ACIDBASEDEF, O2SAT   No results found for: TSH BNP (last 3 results) No results for input(s): BNP in the last 8760 hours.  ProBNP (last 3 results) No results for input(s): PROBNP in the last 8760 hours.  Cardiac Panel (last 3 results) No results for input(s): CKTOTAL, CKMB, TROPONINI, RELINDX in the last 72 hours.  Iron/TIBC/Ferritin/ %Sat  Component Value Date/Time   IRON 15 (L) 09/04/2014 0521   TIBC 303 09/04/2014 0521   FERRITIN 41 09/04/2014 0521   IRONPCTSAT 5 (L) 09/04/2014 0521     EKG Orders placed or performed in visit on 06/25/16  . EKG 12-Lead  . EKG 12-Lead     Prior Assessment and Plan Problem List as of 10/01/2016 Reviewed: 08/31/2016  2:14 PM by Prentice Docker, MD     Cardiovascular and Mediastinum   Mitral regurgitation   Last Assessment & Plan 01/01/2012 Office Visit Written 01/30/2012  3:20 PM by June Leap, MD    No significant mitral regurgitation by physical examination.      PFO (patent foramen ovale)   HTN (hypertension)   Last Assessment & Plan 04/03/2013 Office Visit Written 04/03/2013  2:46 PM by Rande Brunt, PA-C    This has become her most salient issue, and remains quite difficult to manage. Following her last OV, I added HCTZ 12.5 twice a day in an attempt to better control her HTN. However, she developed worsening renal function (peak creatinine 3.3), necessitating complete cessation of Demadex and HCTZ, with subsequent resumption of lisinopril at a lower dose, following improvement in her creatinine level. This may have been due to her known bilateral RAS, followed by Dr. Clifton James, with whom she is scheduled to follow in the next several weeks. In fact, she is scheduled for a renal artery duplex scan next month. Given this, I recommend she not resume HCTZ, but rather start Norvasc 10 mg daily, and return for BP check here in our clinic later this week.      SINUS BRADYCARDIA   Last Assessment & Plan 04/03/2013 Office Visit Written  04/03/2013  2:59 PM by Rande Brunt, PA-C    I believe that this remains a contributing factor in her complaint of dizziness, fatigue, and gait instability. She has now had documented marked sinus bradycardia on several occasions. She remains on low-dose amiodarone and carvedilol, but in addition to Cardizem, which I decreased in the past for this problem. Therefore, I recommend cutting her Cardizem dose in half for 1 week, then stopping it altogether. She is to continue on low-dose amiodarone, for maintenance of NSR, as well as carvedilol. Will reassess her vitals later this week, at which time we should see an improvement in her resting heart rate.       ATHEROSCLEROSIS OF RENAL ARTERY   Last Assessment & Plan 04/26/2012 Office Visit Written 04/26/2012 10:06 AM by Rande Brunt, PA    Followed by Dr. Clifton James in [place]. Conservative management has been recommended.      Dilated cardiomyopathy [facility])   Last Assessment & Plan 04/26/2012 Office Visit Written 04/26/2012 10:05 AM by Rande Brunt, PA    Continue current medication regimen, although unable to further uptitrate carvedilol, which I substituted for labetalol at time of last OV, secondary to resting bradycardia.      Chronic diastolic heart failure [facility])   Last Assessment & Plan 04/03/2013 Office Visit Written 04/03/2013  3:01 PM by Rande Brunt, PA-C    Compensated by history and exam. Do not recommend resuming diuretic therapy, for reasons as outlined above.      Essential hypertension   Chronic atrial fibrillation [facility])   Renal artery stenosis [facility])     Respiratory   Chronic respiratory failure with hypoxia [facility])     Digestive   Dysphagia   Last Assessment & Plan 01/01/2012 Office Visit Written  01/30/2012  3:19 PM by June Leap, MD    Patient reports symptoms of functional dyspepsia.  I scheduled her for a gastric imaging study.  She could have diabetic gastroparesis.      Chronic diarrhea   Last Assessment & Plan  03/05/2014 Office Visit Written 03/05/2014 10:55 AM by Tiffany Kocher, PA-C    83 y/o female with 6 month h/o chronic diarrhea. Reports unremarkable colonoscopy a couple years ago. No obvious medication changes to explain symptoms. No recent antibiotic use. No travel. Differential diagnosis includes microscopic colitis, high on the list. We will check stool studies, CBC, I FOBT. Low likelihood of mesenteric ischemia without abdominal pain or weight loss. If initial work up negative, she may require repeat colonoscopy with random colon biopsies. Recommended she try imodium 2mg  TID prn for now. Further recommendations to follow.       C. difficile colitis   C. difficile diarrhea   Last Assessment & Plan 05/07/2014 Office Visit Written 05/11/2014 10:44 PM by Nira Retort, NP    Initial onset with positive PCR in Aug 2015 with Flagyl failure, improvement with Vancomycin thereafter. Just complete vanc, with improved frequency of stools but still loose. Need to check Cdiff PCR now. If positive, needs higher dose of vanc with tapering. As of note, last colonoscopy in 2012 at outside facility. If persistent diarrhea but negative Cdiff PCR, consider updated colonoscopy with random colonic biopsies.       GERD without esophagitis     Nervous and Auditory   Dementia     Musculoskeletal and Integument   COMPRESSION FRACTURE, SPINE   Last Assessment & Plan 11/04/2010 Office Visit Written 11/04/2010  4:18 PM by June Leap, MD    Patient will followup in [facility] for consideration of kyphoplasty        Genitourinary   Chronic renal insufficiency   Last Assessment & Plan 09/13/2014 Office Visit Written 09/13/2014  4:31 PM by Jodelle Gross, NP    Follow up labs in one month.  On discharge, the patient's creatinine was 1.50.      CKD (chronic kidney disease), stage III     Other   Chronic anticoagulation   Last Assessment & Plan 01/01/2012 Office Visit Written 01/30/2012  3:20 PM by June Leap, MD     No complications.  No problems with bleeding.  Patient recently tripped and had an MRI done of the back.  No hematoma was noted.      Long term (current) use of anticoagulants   Peripheral edema   Last Assessment & Plan 04/03/2013 Office Visit Written 04/03/2013  2:53 PM by Rande Brunt, PA-C    Based on recent evidence of exacerbation of chronic kidney disease, with known bilateral RAS, I have elected to not resume diuretic treatment for her chronic peripheral edema. Moreover, she has documented evidence of NL LVF and RVF, by recent echocardiography. Therefore, recommend conservative management employing use of compression hose (which she has used in the past), and elevating her legs whenever possible.      Gait difficulty   Encounter for therapeutic drug monitoring   Anxiety   Loss of weight   Physical deconditioning   Protein-calorie malnutrition, severe (HCC)   FTT (failure to thrive) in adult   Hyperkalemia   Elevated transaminase level       Imaging: No results found.

## 2016-10-07 ENCOUNTER — Ambulatory Visit (HOSPITAL_COMMUNITY)
Admission: RE | Admit: 2016-10-07 | Discharge: 2016-10-07 | Disposition: A | Discharge status: Home / Self Care | Payer: Medicare Other | Source: Ambulatory Visit | Attending: Adult Health | Admitting: Adult Health

## 2016-10-07 DIAGNOSIS — J90 Pleural effusion, not elsewhere classified: Secondary | ICD-10-CM | POA: Insufficient documentation

## 2016-10-07 DIAGNOSIS — I517 Cardiomegaly: Secondary | ICD-10-CM | POA: Diagnosis not present

## 2016-10-07 DIAGNOSIS — J849 Interstitial pulmonary disease, unspecified: Secondary | ICD-10-CM | POA: Insufficient documentation

## 2016-10-07 DIAGNOSIS — R0602 Shortness of breath: Secondary | ICD-10-CM | POA: Diagnosis present

## 2016-10-08 ENCOUNTER — Telehealth: Payer: Self-pay | Admitting: *Deleted

## 2016-10-08 ENCOUNTER — Ambulatory Visit (INDEPENDENT_AMBULATORY_CARE_PROVIDER_SITE_OTHER): Payer: Medicare Other | Admitting: *Deleted

## 2016-10-08 DIAGNOSIS — Z5181 Encounter for therapeutic drug level monitoring: Secondary | ICD-10-CM | POA: Diagnosis not present

## 2016-10-08 DIAGNOSIS — Z79899 Other long term (current) drug therapy: Secondary | ICD-10-CM

## 2016-10-08 DIAGNOSIS — I701 Atherosclerosis of renal artery: Secondary | ICD-10-CM

## 2016-10-08 LAB — POCT INR: INR: 2.7

## 2016-10-08 NOTE — Telephone Encounter (Signed)
-----   Message from Kathryn M Lawrence, NP sent at 10/08/2016  6:46 AM EST ----- Please increase lasix to 40 mg daily for 3 days and also increase potassium to 20 mEq daily for 3 days.. She has evidence of mild CHF with small pleural effusion. Go back to usual dose of 20 mg daily  And 10mEq daily of potassium after 3 days. Needs follow up BMET in 3 days. If no improvement in O2 saturation, call us. 

## 2016-10-08 NOTE — Telephone Encounter (Signed)
-----   Message from Jodelle Gross, NP sent at 10/08/2016  6:46 AM EST ----- Please increase lasix to 40 mg daily for 3 days and also increase potassium to 20 mEq daily for 3 days.. She has evidence of mild CHF with small pleural effusion. Go back to usual dose of 20 mg daily  And daily of potassium after 3 days. Needs follow up BMET in 3 days. If no improvement in O2 saturation, call us.

## 2016-10-08 NOTE — Telephone Encounter (Signed)
Called patient with test results. No answer. Left message to call back.  

## 2016-10-12 ENCOUNTER — Telehealth: Payer: Self-pay | Admitting: Cardiovascular Disease

## 2016-10-12 NOTE — Telephone Encounter (Signed)
Please make appointment with Dr. Purvis Sheffield this week if possible in [place] office for ongoing complaints. She will also need to see PCP for possible referral to pulmonology.

## 2016-10-12 NOTE — Telephone Encounter (Signed)
Delane Gammon (care giver) - not feeling any better, not acting any different.  Wearing the oxygen all the time now.  Very weak.  Stated that her O2 sats keep dropping.  No weight gain.  Wants to know when she needs to be seen again.

## 2016-10-12 NOTE — Telephone Encounter (Signed)
Per pt phone call --pt is still retaining fluid, would like someone to give her a call [phone]

## 2016-10-13 ENCOUNTER — Encounter: Payer: Self-pay | Admitting: *Deleted

## 2016-10-13 NOTE — Telephone Encounter (Signed)
Please make apt for patient in [place] this week, thanks

## 2016-10-13 NOTE — Telephone Encounter (Addendum)
Appointment scheduled for Thursday with Dr. Purvis Sheffield in [place] office.  (10/15/2016)

## 2016-10-15 ENCOUNTER — Encounter: Payer: Self-pay | Admitting: Cardiovascular Disease

## 2016-10-15 ENCOUNTER — Ambulatory Visit (INDEPENDENT_AMBULATORY_CARE_PROVIDER_SITE_OTHER): Payer: Medicare Other | Admitting: Cardiovascular Disease

## 2016-10-15 VITALS — BP 98/68 | HR 65 | Ht 63.0 in | Wt 123.0 lb

## 2016-10-15 DIAGNOSIS — I10 Essential (primary) hypertension: Secondary | ICD-10-CM | POA: Diagnosis not present

## 2016-10-15 DIAGNOSIS — I482 Chronic atrial fibrillation, unspecified: Secondary | ICD-10-CM

## 2016-10-15 DIAGNOSIS — Z79899 Other long term (current) drug therapy: Secondary | ICD-10-CM

## 2016-10-15 DIAGNOSIS — I342 Nonrheumatic mitral (valve) stenosis: Secondary | ICD-10-CM | POA: Diagnosis not present

## 2016-10-15 DIAGNOSIS — I5033 Acute on chronic diastolic (congestive) heart failure: Secondary | ICD-10-CM | POA: Diagnosis not present

## 2016-10-15 DIAGNOSIS — I350 Nonrheumatic aortic (valve) stenosis: Secondary | ICD-10-CM

## 2016-10-15 DIAGNOSIS — R531 Weakness: Secondary | ICD-10-CM | POA: Diagnosis not present

## 2016-10-15 DIAGNOSIS — I701 Atherosclerosis of renal artery: Secondary | ICD-10-CM | POA: Diagnosis not present

## 2016-10-15 NOTE — Patient Instructions (Signed)
Medication Instructions:  Continue all current medications.  Labwork: CBC, urinalysis - orders given today.  Testing/Procedures: Chest x-ray - order given today.  Follow-Up:  Office will contact with results via phone or letter.    1 month  Any Other Special Instructions Will Be Listed Below (If Applicable). See family doctor soon to follow up.  If you need a refill on your cardiac medications before your next appointment, please call your pharmacy.

## 2016-10-15 NOTE — Progress Notes (Signed)
SUBJECTIVE: The patient presents  for follow-up of atrial fibrillation, acute on chronic diastolic heart failure, pacemaker, and hypertension in the context of chronic kidney disease.  She has been having issues with CHF and hypoxemia related to this.  Echocardiogram 10/01/16: Normal left ventricular systolic function, LVEF 60-65%, diastolic dysfunction with restrictive physiology, elevated filling pressures, wall motion abnormalities, mild to moderate aortic stenosis, mild mitral stenosis, moderate mitral regurgitation, mild to moderate tricuspid regurgitation, moderately reduced right ventricular systolic function, and moderate pulmonary hypertension.  Chest x-ray 10/07/16: Cardiomegaly with mild bilateral interstitial prominence and small left pleural effusion.  Labs 2/22: BUN 25, creatinine 1.41, sodium 141, potassium 3.6.  Her husband says that they have been unable to wean her from oxygen in the past 2 weeks. She took an increased dose of Lasix for a few days. She denies nausea and vomiting. Primary complaints relate to generalized weakness. There is no increase in leg swelling.  CBC 10/01/16: Hemoglobin 15.5, white blood cells 8.   Review of Systems: As per "subjective", otherwise negative.  No Known Allergies  Current Outpatient Prescriptions  Medication Sig Dispense Refill  . Biotin 5000 MCG TABS Take 1 tablet by mouth daily. 10,000 mcg daily    . Calcium Carbonate-Vitamin D (CALTRATE 600+D PO) Take 1 tablet by mouth 2 (two) times daily.     . carvedilol (COREG) 12.5 MG tablet TAKE 1 TABLET BY MOUTH EVERY MORNING AND ONE-HALF TABLET BY MOUTH EVERY EVENING 45 tablet 3  . Cyanocobalamin (VITAMIN B-12 PO) Take 1 tablet by mouth daily.    Marland Kitchen diltiazem (CARDIZEM) 90 MG tablet TAKE 1 TABLET BY MOUTH TWICE DAILY 60 tablet 6  . donepezil (ARICEPT) 10 MG tablet Take 1 tablet by mouth at bedtime.  6  . feeding supplement, ENSURE COMPLETE, (ENSURE COMPLETE) LIQD Take 237 mLs by mouth 3  (three) times daily between meals. 90 Bottle 1  . furosemide (LASIX) 20 MG tablet Take 1 tablet (20 mg total) by mouth daily.    . mirtazapine (REMERON) 15 MG tablet Take 1 tablet by mouth at bedtime.  5  . Multiple Vitamin (MULTIVITAMIN) tablet Take 1 tablet by mouth daily.     Marland Kitchen NAMZARIC 28-10 MG CP24 take one capsule by mouth once daily  12  . omeprazole (PRILOSEC) 20 MG capsule Take 20 mg by mouth daily.    . potassium chloride (K-DUR) 10 MEQ tablet Take 1 tablet (10 mEq total) by mouth daily. 30 tablet 0  . warfarin (COUMADIN) 2 MG tablet Take 1 tablet by mouth as directed.  3   No current facility-administered medications for this visit.     Past Medical History:  Diagnosis Date  . Chronic atrial fibrillation (HCC)   . Chronic diastolic CHF (congestive heart failure) (HCC)   . CKD (chronic kidney disease), stage III   . Coronary artery disease   . Dementia   . Encounter for long-term (current) use of other medications    COUMADIN THERAPY  . Essential hypertension   . GERD (gastroesophageal reflux disease)   . Lower extremity weakness   . Pericardial effusion    a. Echo 08/2014: mod pericardial effusion.  . Renal artery stenosis (HCC)    a. right renal artery stenosis (1-59% by duplex in 04/2013).  . Sinoatrial node dysfunction (HCC)     SINUS BRADYCARDIA  . Sinus bradycardia     Past Surgical History:  Procedure Laterality Date  . BACK SURGERY    . CARDIOVERSION  x2  . CHOLECYSTECTOMY    . COLONOSCOPY  05/2011   Dr. Allena Katz: normal. no bx.  Marland Kitchen KNEE ARTHROSCOPY      Social History   Social History  . Marital status: Married    Spouse name: [name]  . Number of children: 0  . Years of education: 12   Occupational History  . RETIRED   .  Retired   Social History Main Topics  . Smoking status: Never Smoker  . Smokeless tobacco: Never Used     Comment: Never smoked  . Alcohol use No  . Drug use: No  . Sexual activity: Not Currently   Other Topics Concern    . Not on file   Social History Narrative   Patient lives at home with her husband [name]) Patient is retired. Patient has 12 th grade education.   Right handed.   Caffeine- two three cups of coffee daily.     Vitals:   10/15/16 1041  BP: 98/68  Pulse: 65  SpO2: 99%  Weight: 123 lb (55.8 kg)  Height: 5\' 3"  (1.6 m)    PHYSICAL EXAM General: Lethargic. Neck: No JVD, no thyromegaly.  Lungs: No rales. CV: Regular rate and rhythm, normal S1/S2, no S3, II/VI pansystolic murmur along left sternal border. No significant pretibial edema b/l. Venous varicosities b/l. Abdomen: Soft, nontender, no distention.  Neurologic: Alert, lethargic. Psych: Normal affect.  Skin: Normal. Musculoskeletal: No gross deformities. Extremities: No clubbing or cyanosis.     ECG: Most recent ECG reviewed.      ASSESSMENT AND PLAN: 1. Acute on chronic diastolic heart failure: Sats 99% today. Currently on Lasix 20 mg daily. She appears euvolemic. Her weight is stable. I will repeat a basic metabolic panel to evaluate renal function. Will obtain a follow up chest xray.  2. Chronic renal insufficiency/Right renal artery stenosis: Repeat renal Dopplers possibly later this year. I will repeat a basic metabolic panel to evaluate renal function.  3. Atrial fibrillation: HR controlled on Coreg and diltiazem and is symptomatically stable. On warfarin for anticoagulation, followed here in our clinic.  4. Pacemaker: Enrolled in device clinic.  5. Aortic stenosis: Mild to moderate.  6. Mitral stenosis: Mild.  7. Pulmonary hypertension: Major contribution from restrictive diastolic dysfunction and possible interstitial lung disease.  8. Hypertension: Low normal.  9. Generalized weakness: I am not convinced that her problems are cardiac. I will check a basic metabolic panel, CBC, urinalysis, and chest x-ray. This may represent an overall decline in health and deterioration.  Time spent: 40 minutes,  of which greater than 50% was spent reviewing symptoms, relevant blood tests and studies, and discussing management plan with the patient.   Prentice Docker, M.D., F.A.C.C.

## 2016-10-16 ENCOUNTER — Ambulatory Visit: Payer: Medicare Other | Admitting: Cardiovascular Disease

## 2016-10-19 ENCOUNTER — Telehealth: Payer: Self-pay

## 2016-10-19 NOTE — Telephone Encounter (Signed)
-----   Message from Eustace Moore, LPN sent at 10/01/9796  9:52 AM EDT -----   ----- Message ----- From: Laqueta Linden, MD Sent: 10/16/2016   4:55 PM To: Eustace Moore, LPN  Hgb is normal.

## 2016-10-19 NOTE — Telephone Encounter (Signed)
HUSBAND NOTIFIED OF BLOOD WORK

## 2016-10-21 ENCOUNTER — Telehealth: Payer: Self-pay | Admitting: *Deleted

## 2016-10-21 NOTE — Telephone Encounter (Signed)
Notes Recorded by Lesle Chris, LPN on 01/14/3709 at 9:23 AM EDT Husband [name]) notified. Copy to pmd. Follow up scheduled for 11/05/2016. ------  Notes Recorded by Laqueta Linden, MD on 10/20/2016 at 2:42 PM EDT Small amount of fluid at the bottom of the lungs, but no CHF within the lungs per se.

## 2016-10-26 ENCOUNTER — Other Ambulatory Visit: Payer: Self-pay | Admitting: *Deleted

## 2016-10-26 ENCOUNTER — Encounter: Payer: Self-pay | Admitting: *Deleted

## 2016-10-26 DIAGNOSIS — Z950 Presence of cardiac pacemaker: Secondary | ICD-10-CM

## 2016-10-27 ENCOUNTER — Telehealth: Payer: Self-pay | Admitting: *Deleted

## 2016-10-27 NOTE — Telephone Encounter (Signed)
Notes recorded by Lesle Chris, LPN on 4/59/9774 at 8:33 AM EDT Husband [name]) notified. Copy to pmd. ------  Notes recorded by Laqueta Linden, MD on 10/26/2016 at 3:33 PM EDT No UTI.

## 2016-11-02 ENCOUNTER — Encounter: Payer: Self-pay | Admitting: Cardiovascular Disease

## 2016-11-02 ENCOUNTER — Ambulatory Visit (INDEPENDENT_AMBULATORY_CARE_PROVIDER_SITE_OTHER): Payer: Medicare Other | Admitting: Cardiovascular Disease

## 2016-11-02 VITALS — BP 118/64 | HR 60 | Ht 63.0 in | Wt 125.0 lb

## 2016-11-02 DIAGNOSIS — I701 Atherosclerosis of renal artery: Secondary | ICD-10-CM | POA: Diagnosis not present

## 2016-11-02 DIAGNOSIS — I350 Nonrheumatic aortic (valve) stenosis: Secondary | ICD-10-CM

## 2016-11-02 DIAGNOSIS — R531 Weakness: Secondary | ICD-10-CM | POA: Diagnosis not present

## 2016-11-02 DIAGNOSIS — I342 Nonrheumatic mitral (valve) stenosis: Secondary | ICD-10-CM | POA: Diagnosis not present

## 2016-11-02 DIAGNOSIS — I5032 Chronic diastolic (congestive) heart failure: Secondary | ICD-10-CM | POA: Diagnosis not present

## 2016-11-02 DIAGNOSIS — I482 Chronic atrial fibrillation, unspecified: Secondary | ICD-10-CM

## 2016-11-02 DIAGNOSIS — I10 Essential (primary) hypertension: Secondary | ICD-10-CM | POA: Diagnosis not present

## 2016-11-02 NOTE — Progress Notes (Signed)
SUBJECTIVE: The patient is an 83 year old woman with multiple medical problems. I evaluated her on 3/8 and she was complaining of generalized weakness. I obtained a variety of studies. She was not found to be anemic nor had a UTI. Chest x-ray showed small bilateral pleural effusions but no pulmonary edema.  She has restrictive diastolic heart failure, pacemaker, and atrial fibrillation. She also has aortic and mitral stenosis and mitral and tricuspid regurgitation.  Echocardiogram 10/01/16: Normal left ventricular systolic function, LVEF 60-65%, diastolic dysfunction with restrictive physiology, elevated filling pressures, wall motion abnormalities, mild to moderate aortic stenosis, mild mitral stenosis, moderate mitral regurgitation, mild to moderate tricuspid regurgitation, moderately reduced right ventricular systolic function, and moderate pulmonary hypertension.  Wt 125 lbs (123 lbs 3/8, wearing a coat today).  Her caretaker feels that she is slightly stronger than she was 2 weeks ago. There has been no increase in leg swelling. She continues to use oxygen but they have not tried to wean her recently.  Review of Systems: As per "subjective", otherwise negative.  No Known Allergies  Current Outpatient Prescriptions  Medication Sig Dispense Refill  . Biotin 5000 MCG TABS Take 1 tablet by mouth daily. 10,000 mcg daily    . Calcium Carbonate-Vitamin D (CALTRATE 600+D PO) Take 1 tablet by mouth 2 (two) times daily.     . carvedilol (COREG) 12.5 MG tablet TAKE 1 TABLET BY MOUTH EVERY MORNING AND ONE-HALF TABLET BY MOUTH EVERY EVENING 45 tablet 3  . Cyanocobalamin (VITAMIN B-12 PO) Take 1 tablet by mouth daily.    Marland Kitchen diltiazem (CARDIZEM) 90 MG tablet TAKE 1 TABLET BY MOUTH TWICE DAILY 60 tablet 6  . donepezil (ARICEPT) 10 MG tablet Take 1 tablet by mouth at bedtime.  6  . feeding supplement, ENSURE COMPLETE, (ENSURE COMPLETE) LIQD Take 237 mLs by mouth 3 (three) times daily between  meals. 90 Bottle 1  . furosemide (LASIX) 20 MG tablet Take 1 tablet (20 mg total) by mouth daily.    . mirtazapine (REMERON) 15 MG tablet Take 1 tablet by mouth at bedtime.  5  . Multiple Vitamin (MULTIVITAMIN) tablet Take 1 tablet by mouth daily.     Marland Kitchen NAMZARIC 28-10 MG CP24 take one capsule by mouth once daily  12  . omeprazole (PRILOSEC) 20 MG capsule Take 20 mg by mouth daily.    . potassium chloride (K-DUR) 10 MEQ tablet Take 1 tablet (10 mEq total) by mouth daily. 30 tablet 0  . warfarin (COUMADIN) 2 MG tablet Take 1 tablet by mouth as directed.  3   No current facility-administered medications for this visit.     Past Medical History:  Diagnosis Date  . Chronic atrial fibrillation (HCC)   . Chronic diastolic CHF (congestive heart failure) (HCC)   . CKD (chronic kidney disease), stage III   . Coronary artery disease   . Dementia   . Encounter for long-term (current) use of other medications    COUMADIN THERAPY  . Essential hypertension   . GERD (gastroesophageal reflux disease)   . Lower extremity weakness   . Pericardial effusion    a. Echo 08/2014: mod pericardial effusion.  . Renal artery stenosis (HCC)    a. right renal artery stenosis (1-59% by duplex in 04/2013).  . Sinoatrial node dysfunction (HCC)     SINUS BRADYCARDIA  . Sinus bradycardia     Past Surgical History:  Procedure Laterality Date  . BACK SURGERY    . CARDIOVERSION  x2  . CHOLECYSTECTOMY    . COLONOSCOPY  05/2011   Dr. Allena Katz: normal. no bx.  Marland Kitchen KNEE ARTHROSCOPY      Social History   Social History  . Marital status: Married    Spouse name: [name]  . Number of children: 0  . Years of education: 12   Occupational History  . RETIRED   .  Retired   Social History Main Topics  . Smoking status: Never Smoker  . Smokeless tobacco: Never Used     Comment: Never smoked  . Alcohol use No  . Drug use: No  . Sexual activity: Not Currently   Other Topics Concern  . Not on file   Social  History Narrative   Patient lives at home with her husband [name]) Patient is retired. Patient has 12 th grade education.   Right handed.   Caffeine- two three cups of coffee daily.     Vitals:   11/02/16 1346  BP: 118/64  Pulse: 60  SpO2: 97%  Weight: 125 lb (56.7 kg)  Height: 5\' 3"  (1.6 m)    PHYSICAL EXAM General: NAD, using oxygen by nasal cannula. Neck: No JVD, no thyromegaly.  Lungs: No rales. CV: Regular rate andrhythm, normal S1/S2, no S3, II/VI pansystolic murmur along left sternal border. No significant pretibial edema b/l. Venous varicosities b/l. Abdomen: Soft, nontender, no distention.  Neurologic: Alert.Marland Kitchen Psych: Normal affect.  Skin: Normal. Musculoskeletal: No gross deformities. Extremities: No clubbing or cyanosis.     ECG: Most recent ECG reviewed.      ASSESSMENT AND PLAN: 1. Chronic diastolic heart failure: Sats 97% today. Currently on Lasix 20 mg daily. She appears euvolemic. Her weight is stable.  2. Chronic renal insufficiency/Right renal artery stenosis: Repeat renal Dopplers possibly later this year.  3. Atrial fibrillation: HR controlled on Coreg and diltiazem and is symptomatically stable. On warfarin for anticoagulation, followed here in our clinic.  4. Pacemaker: Enrolled in device clinic.  5. Aortic stenosis: Mild to moderate.  6. Mitral stenosis: Mild.  7. Pulmonary hypertension: Major contribution from restrictive diastolic dysfunction and possible interstitial lung disease.  8. Hypertension: Low normal.  9. Generalized weakness: This may represent an overall decline in health and deterioration.   Dispo: fu 6 months. Encouraged to see PCP.   Prentice Docker, M.D., F.A.C.C.

## 2016-11-02 NOTE — Patient Instructions (Signed)
Your physician wants you to follow-up in: 6 MONTHS WITH DR. KONESWAREN. You will receive a reminder letter in the mail two months in advance. If you don't receive a letter, please call our office to schedule the follow-up appointment.  Your physician recommends that you continue on your current medications as directed. Please refer to the Current Medication list given to you today.  

## 2016-11-04 ENCOUNTER — Telehealth: Payer: Self-pay | Admitting: Cardiovascular Disease

## 2016-11-04 NOTE — Telephone Encounter (Signed)
Pt had been started on amoxicillin for congestion.  Told CG it is ok for pt to take it with coumadin.  Keep regular INR appt.  She verbalized understanding.

## 2016-11-04 NOTE — Telephone Encounter (Signed)
Mr. Barefield called stating that [name] was put on antibiotics 11/03/16 Wanted to see how it would affect her coumdin.

## 2016-11-10 ENCOUNTER — Ambulatory Visit (INDEPENDENT_AMBULATORY_CARE_PROVIDER_SITE_OTHER): Payer: Medicare Other | Admitting: *Deleted

## 2016-11-10 DIAGNOSIS — I701 Atherosclerosis of renal artery: Secondary | ICD-10-CM

## 2016-11-10 DIAGNOSIS — Z5181 Encounter for therapeutic drug level monitoring: Secondary | ICD-10-CM | POA: Diagnosis not present

## 2016-11-10 LAB — POCT INR: INR: 4

## 2016-12-01 ENCOUNTER — Other Ambulatory Visit: Payer: Self-pay | Admitting: Cardiovascular Disease

## 2016-12-01 DIAGNOSIS — Z7901 Long term (current) use of anticoagulants: Secondary | ICD-10-CM

## 2016-12-01 DIAGNOSIS — N189 Chronic kidney disease, unspecified: Secondary | ICD-10-CM

## 2016-12-01 DIAGNOSIS — I5032 Chronic diastolic (congestive) heart failure: Secondary | ICD-10-CM

## 2016-12-01 DIAGNOSIS — R609 Edema, unspecified: Secondary | ICD-10-CM

## 2016-12-01 DIAGNOSIS — I10 Essential (primary) hypertension: Secondary | ICD-10-CM

## 2016-12-01 DIAGNOSIS — I701 Atherosclerosis of renal artery: Secondary | ICD-10-CM

## 2016-12-01 DIAGNOSIS — I4821 Permanent atrial fibrillation: Secondary | ICD-10-CM

## 2016-12-08 ENCOUNTER — Ambulatory Visit (INDEPENDENT_AMBULATORY_CARE_PROVIDER_SITE_OTHER): Payer: Medicare Other | Admitting: *Deleted

## 2016-12-08 DIAGNOSIS — I701 Atherosclerosis of renal artery: Secondary | ICD-10-CM

## 2016-12-08 DIAGNOSIS — Z5181 Encounter for therapeutic drug level monitoring: Secondary | ICD-10-CM

## 2016-12-08 LAB — POCT INR: INR: 3.4

## 2016-12-23 ENCOUNTER — Other Ambulatory Visit: Payer: Self-pay | Admitting: *Deleted

## 2016-12-23 MED ORDER — WARFARIN SODIUM 2 MG PO TABS: 2.0000 mg | ORAL_TABLET | ORAL | 3 refills | Status: DC

## 2016-12-28 ENCOUNTER — Encounter: Payer: Medicare Other | Admitting: Cardiology

## 2016-12-28 ENCOUNTER — Encounter: Payer: Self-pay | Admitting: Internal Medicine

## 2016-12-28 ENCOUNTER — Ambulatory Visit (INDEPENDENT_AMBULATORY_CARE_PROVIDER_SITE_OTHER): Payer: Medicare Other | Admitting: Internal Medicine

## 2016-12-28 VITALS — BP 158/84 | HR 90 | Ht 65.0 in | Wt 121.0 lb

## 2016-12-28 DIAGNOSIS — I482 Chronic atrial fibrillation, unspecified: Secondary | ICD-10-CM

## 2016-12-28 DIAGNOSIS — R001 Bradycardia, unspecified: Secondary | ICD-10-CM

## 2016-12-28 DIAGNOSIS — I701 Atherosclerosis of renal artery: Secondary | ICD-10-CM

## 2016-12-28 LAB — CUP PACEART INCLINIC DEVICE CHECK
Brady Statistic RV Percent Paced: 32 %
Date Time Interrogation Session: 20180521150416
Implantable Lead Location: 753858
Lead Channel Pacing Threshold Pulse Width: 0.4 ms
Lead Channel Pacing Threshold Pulse Width: 0.4 ms
Lead Channel Setting Pacing Amplitude: 2.5 V
MDC IDC LEAD IMPLANT DT: 20170921
MDC IDC MSMT BATTERY VOLTAGE: 3.02 V
MDC IDC MSMT LEADCHNL RV IMPEDANCE VALUE: 587.5 Ohm
MDC IDC MSMT LEADCHNL RV PACING THRESHOLD AMPLITUDE: 0.5 V
MDC IDC MSMT LEADCHNL RV PACING THRESHOLD AMPLITUDE: 0.5 V
MDC IDC MSMT LEADCHNL RV SENSING INTR AMPL: 12 mV
MDC IDC PG IMPLANT DT: 20170921
MDC IDC SET LEADCHNL RV PACING PULSEWIDTH: 0.4 ms
MDC IDC SET LEADCHNL RV SENSING SENSITIVITY: 2 mV
Pulse Gen Model: 1160
Pulse Gen Serial Number: 7882251

## 2016-12-28 NOTE — Progress Notes (Signed)
Karen Specking, MD: Primary Cardiologist:  Dr Sherral Hammers is a 83 y.o. female with a h/o bradycardia sp PPM (SJM) by Dr Georganna Skeans in [name] who presents today to establish care in the Electrophysiology device clinic.  She is a patient of Dr Purvis Sheffield.  She developed syncope 04/2016 and was taken urgently to [name] where she underwent PPM implanation for bradycardia.  She has permanent afib.  She has done well since her PPM without any further syncope.  She is otherwise fragile.   The patient reports doing very well since having a pacemaker implanted and remains very active despite her age.   Today, she  denies symptoms of palpitations, chest pain, shortness of breath, orthopnea, PND, lower extremity edema, dizziness, presyncope, syncope, or neurologic sequela.  The patientis tolerating medications without difficulties and is otherwise without complaint today.   Past Medical History:  Diagnosis Date  . Chronic atrial fibrillation (HCC)   . Chronic diastolic CHF (congestive heart failure) (HCC)   . CKD (chronic kidney disease), stage III   . Coronary artery disease   . Dementia   . Encounter for long-term (current) use of other medications    COUMADIN THERAPY  . Essential hypertension   . GERD (gastroesophageal reflux disease)   . Lower extremity weakness   . Pericardial effusion    a. Echo 08/2014: mod pericardial effusion.  . Renal artery stenosis (HCC)    a. right renal artery stenosis (1-59% by duplex in 04/2013).  . Sinoatrial node dysfunction (HCC)     SINUS BRADYCARDIA  . Sinus bradycardia    Past Surgical History:  Procedure Laterality Date  . BACK SURGERY    . CARDIOVERSION     x2  . CHOLECYSTECTOMY    . COLONOSCOPY  05/2011   Dr. Allena Katz: normal. no bx.  Marland Kitchen KNEE ARTHROSCOPY    . PACEMAKER IMPLANT  04/30/2016   SJM Endurity VR PPM implanted by Dr Danella Maiers in [place]    Social History   Social History  . Marital status: Married   Spouse name: [name]  . Number of children: 0  . Years of education: 12   Occupational History  . RETIRED   .  Retired   Social History Main Topics  . Smoking status: Never Smoker  . Smokeless tobacco: Never Used     Comment: Never smoked  . Alcohol use No  . Drug use: No  . Sexual activity: Not Currently   Other Topics Concern  . Not on file   Social History Narrative   Patient lives at home with her husband [name]) in [name].  Patient is retired. Patient has 12 th grade education.   Right handed.   Caffeine- two three cups of coffee daily.    Family History  Problem Relation Age of Onset  . Heart failure Mother   . High blood pressure Mother   . Cancer Brother   . Cirrhosis Other     No Known Allergies  Current Outpatient Prescriptions  Medication Sig Dispense Refill  . ALPRAZolam (XANAX) 0.5 MG tablet Take 0.5 mg by mouth at bedtime as needed for anxiety or sleep.    . Biotin 5000 MCG TABS Take by mouth daily. 10,000 mcg daily    . Calcium Carbonate-Vitamin D (CALTRATE 600+D PO) Take 1 tablet by mouth 2 (two) times daily.     . carvedilol (COREG) 12.5 MG tablet TAKE 1 TABLET BY MOUTH EVERY MORNING AND ONE-HALF TABLET BY MOUTH  EVERY EVENING 45 tablet 3  . Cyanocobalamin (VITAMIN B-12 PO) Take 1 tablet by mouth daily.    Marland Kitchen diltiazem (CARDIZEM) 90 MG tablet TAKE 1 TABLET BY MOUTH TWICE DAILY 60 tablet 6  . donepezil (ARICEPT) 10 MG tablet Take 1 tablet by mouth at bedtime.  6  . feeding supplement, ENSURE COMPLETE, (ENSURE COMPLETE) LIQD Take 237 mLs by mouth 3 (three) times daily between meals. 90 Bottle 1  . furosemide (LASIX) 20 MG tablet Take 1 tablet (20 mg total) by mouth daily.    Marland Kitchen HYDROcodone-acetaminophen (NORCO/VICODIN) 5-325 MG tablet Take 1 tablet by mouth every 8 (eight) hours as needed for pain.  0  . mirtazapine (REMERON) 15 MG tablet Take 1 tablet by mouth at bedtime.  5  . Multiple Vitamin (MULTIVITAMIN) tablet Take 1 tablet by mouth daily.     Marland Kitchen  NAMZARIC 28-10 MG CP24 take one capsule by mouth once daily  12  . omeprazole (PRILOSEC) 20 MG capsule Take 20 mg by mouth daily.    . ondansetron (ZOFRAN) 4 MG tablet Take 4 mg by mouth every 8 (eight) hours as needed for nausea or vomiting.    . potassium chloride (K-DUR) 10 MEQ tablet Take 1 tablet (10 mEq total) by mouth daily. 30 tablet 0  . warfarin (COUMADIN) 2 MG tablet Take 1 tablet (2 mg total) by mouth as directed. 30 tablet 3   No current facility-administered medications for this visit.     ROS- all systems are reviewed and negative except as per HPI  Physical Exam: Vitals:   12/28/16 1427  BP: (!) 158/84  Pulse: 90  Weight: 121 lb (54.9 kg)  Height: 5\' 5"  (1.651 m)    GEN- The patient is elderly and frail appearing, alert and oriented x 3 today.   Head- normocephalic, atraumatic Eyes-  Sclera clear, conjunctiva pink Ears- hearing intact Oropharynx- clear Neck- supple,   Lungs- Clear to ausculation bilaterally, normal work of breathing Chest- pacemaker pocket is well healed Heart- irregular rate and rhythm, no murmurs, rubs or gallops, PMI not laterally displaced GI- soft, NT, ND, + BS Extremities- no clubbing, cyanosis, or edema MS- diffuse muscle atrophy Skin- no rash or lesion Psych- euthymic mood, full affect Neuro- strength and sensation are intact  Pacemaker interrogation- reviewed in detail today,  See PACEART report  ekgs in epic are reviewed and reveal Afib on all ekgs from 2016 forward.  Assessment and Plan:  1. Symptomatic bradycardia Normal pacemaker function See Pace Art report No changes today Could consider lowering lower V rate to 40 or 50 bpm if she develops any clinical decline (V pacees 32% currently) Will enroll in remote monitoring  2. Permanent afib On coumadin per Dr Purvis Sheffield Rate controlled  No changes today Merlin Return to see me in [place] in 1 year Follow-up with Dr Purvis Sheffield as scheduled  Hillis Range MD,  [facility] 12/28/2016 3:11 PM

## 2016-12-28 NOTE — Patient Instructions (Signed)
Your physician recommends that you continue on your current medications as directed. Please refer to the Current Medication list given to you today.   Your physician wants you to follow-up in: 1 year with Dr. Johney Frame in [place]. You will receive a reminder letter in the mail two months in advance. If you don't receive a letter, please call our office to schedule the follow-up appointment.   Remote monitoring is used to monitor your Pacemaker from home. This monitoring reduces the number of office visits required to check your device to one time per year. It allows [place] to keep an eye on the functioning of your device to ensure it is working properly. You are scheduled for a device check from home on 03/29/17. You may send your transmission at any time that day. If you have a wireless device, the transmission will be sent automatically. After your physician reviews your transmission, you will receive a postcard with your next transmission date.

## 2016-12-29 ENCOUNTER — Ambulatory Visit (INDEPENDENT_AMBULATORY_CARE_PROVIDER_SITE_OTHER): Payer: Medicare Other | Admitting: *Deleted

## 2016-12-29 DIAGNOSIS — I701 Atherosclerosis of renal artery: Secondary | ICD-10-CM | POA: Diagnosis not present

## 2016-12-29 DIAGNOSIS — Z5181 Encounter for therapeutic drug level monitoring: Secondary | ICD-10-CM | POA: Diagnosis not present

## 2016-12-29 LAB — POCT INR: INR: 2.7

## 2017-01-26 ENCOUNTER — Ambulatory Visit (INDEPENDENT_AMBULATORY_CARE_PROVIDER_SITE_OTHER): Payer: Medicare Other | Admitting: *Deleted

## 2017-01-26 DIAGNOSIS — Z5181 Encounter for therapeutic drug level monitoring: Secondary | ICD-10-CM | POA: Diagnosis not present

## 2017-01-26 DIAGNOSIS — I701 Atherosclerosis of renal artery: Secondary | ICD-10-CM | POA: Diagnosis not present

## 2017-01-26 LAB — POCT INR: INR: 2.7

## 2017-02-23 ENCOUNTER — Ambulatory Visit (INDEPENDENT_AMBULATORY_CARE_PROVIDER_SITE_OTHER): Payer: Medicare Other | Admitting: *Deleted

## 2017-02-23 DIAGNOSIS — Z5181 Encounter for therapeutic drug level monitoring: Secondary | ICD-10-CM | POA: Diagnosis not present

## 2017-02-23 DIAGNOSIS — I701 Atherosclerosis of renal artery: Secondary | ICD-10-CM

## 2017-02-23 LAB — POCT INR: INR: 2.8

## 2017-02-25 ENCOUNTER — Telehealth: Payer: Self-pay | Admitting: Cardiovascular Disease

## 2017-02-25 DIAGNOSIS — I4821 Permanent atrial fibrillation: Secondary | ICD-10-CM

## 2017-02-25 DIAGNOSIS — I10 Essential (primary) hypertension: Secondary | ICD-10-CM

## 2017-02-25 DIAGNOSIS — I5032 Chronic diastolic (congestive) heart failure: Secondary | ICD-10-CM

## 2017-02-25 DIAGNOSIS — I701 Atherosclerosis of renal artery: Secondary | ICD-10-CM

## 2017-02-25 DIAGNOSIS — R609 Edema, unspecified: Secondary | ICD-10-CM

## 2017-02-25 DIAGNOSIS — Z7901 Long term (current) use of anticoagulants: Secondary | ICD-10-CM

## 2017-02-25 MED ORDER — DILTIAZEM HCL 120 MG PO TABS: 120.0000 mg | ORAL_TABLET | ORAL | 6 refills | Status: DC

## 2017-02-25 MED ORDER — DILTIAZEM HCL 90 MG PO TABS: 90.0000 mg | ORAL_TABLET | Freq: Every evening | ORAL | Status: AC

## 2017-02-25 NOTE — Telephone Encounter (Signed)
Husband [name]) notified.  Stated that her BP this evening was 181/117  93.  Stated that he just has given her the evening dose of her Diltiazem 90mg  & is eating pretty good right now.  States that she has layed around most of the day.  C/O weakness also.  New Diltiazem 120mg  every morning will be sent to [facility] now.  Advised that he may also try some G2 - gatorade to help a little extra when she is not eating so good.  He will continue to monitor & call back if anything changes.

## 2017-02-25 NOTE — Telephone Encounter (Signed)
Increase morning dose of diltiazem to 120 mg.

## 2017-02-25 NOTE — Telephone Encounter (Signed)
[name] family called stating that her BP is running high. [name]   BP is high  181/100  & 173/90 both BP's were taken today.

## 2017-02-25 NOTE — Telephone Encounter (Signed)
[name] - step daughter - bp elevated, been in wheelchair this week, weakness.  States that they notice that BP does go up with anything physical & then takes a few hours got go down.  No chest pain, SOB the same, no weight gain, ankles did not look swollen.    This am 181/100  88   12:30 - 173 95  77  157/103  88 - yesterday 138/89  65 yesterday evening   7/17  137/85  73

## 2017-03-22 ENCOUNTER — Other Ambulatory Visit: Payer: Self-pay | Admitting: Cardiovascular Disease

## 2017-03-29 ENCOUNTER — Encounter: Payer: Medicare Other | Admitting: *Deleted

## 2017-04-01 ENCOUNTER — Encounter: Payer: Self-pay | Admitting: Cardiology

## 2017-04-06 ENCOUNTER — Telehealth: Payer: Self-pay | Admitting: *Deleted

## 2017-04-06 ENCOUNTER — Ambulatory Visit (INDEPENDENT_AMBULATORY_CARE_PROVIDER_SITE_OTHER): Payer: Medicare Other | Admitting: *Deleted

## 2017-04-06 DIAGNOSIS — Z5181 Encounter for therapeutic drug level monitoring: Secondary | ICD-10-CM

## 2017-04-06 DIAGNOSIS — I701 Atherosclerosis of renal artery: Secondary | ICD-10-CM | POA: Diagnosis not present

## 2017-04-06 LAB — POCT INR: INR: 3.7

## 2017-04-06 NOTE — Telephone Encounter (Signed)
[name] (husband) notified.    Notes recorded by Lesle Chris, LPN on 2/67/1245 at 5:55 PM EDT Patient notified. Labs sent from pmd. Follow up scheduled for 05/04/2017 with Dr. Purvis Sheffield. ------  Notes recorded by Laqueta Linden, MD on 04/01/2017 at 4:12 PM EDT While cholesterol is elevated, I would first consider trying dietary modification rather than starting a new medication in this elderly, frail woman.

## 2017-04-08 ENCOUNTER — Other Ambulatory Visit: Payer: Self-pay | Admitting: Cardiovascular Disease

## 2017-04-13 ENCOUNTER — Telehealth: Payer: Self-pay | Admitting: *Deleted

## 2017-04-13 NOTE — Telephone Encounter (Signed)
Spoke with Mr. Pruden and Mrs. Mikkola aid- instructed how to send transmission. Transmission in process, they are aware to leave monitor plugged in at all times.

## 2017-04-13 NOTE — Telephone Encounter (Signed)
Husband [name]) calling stating that he received letter about not sending transmission.  Stated that he no longer has a land line phone, only mobile.  Please advise.

## 2017-04-20 ENCOUNTER — Other Ambulatory Visit: Payer: Self-pay | Admitting: Cardiovascular Disease

## 2017-04-22 ENCOUNTER — Ambulatory Visit (INDEPENDENT_AMBULATORY_CARE_PROVIDER_SITE_OTHER): Payer: Medicare Other | Admitting: *Deleted

## 2017-04-22 DIAGNOSIS — Z5181 Encounter for therapeutic drug level monitoring: Secondary | ICD-10-CM

## 2017-04-22 DIAGNOSIS — I482 Chronic atrial fibrillation, unspecified: Secondary | ICD-10-CM

## 2017-04-22 LAB — POCT INR: INR: 2.5

## 2017-05-04 ENCOUNTER — Ambulatory Visit (INDEPENDENT_AMBULATORY_CARE_PROVIDER_SITE_OTHER): Payer: Medicare Other | Admitting: Cardiovascular Disease

## 2017-05-04 ENCOUNTER — Encounter: Payer: Self-pay | Admitting: Cardiovascular Disease

## 2017-05-04 VITALS — BP 138/80 | HR 70 | Ht 65.0 in | Wt 114.0 lb

## 2017-05-04 DIAGNOSIS — I701 Atherosclerosis of renal artery: Secondary | ICD-10-CM | POA: Diagnosis not present

## 2017-05-04 DIAGNOSIS — I482 Chronic atrial fibrillation, unspecified: Secondary | ICD-10-CM

## 2017-05-04 DIAGNOSIS — I5032 Chronic diastolic (congestive) heart failure: Secondary | ICD-10-CM | POA: Diagnosis not present

## 2017-05-04 DIAGNOSIS — I38 Endocarditis, valve unspecified: Secondary | ICD-10-CM

## 2017-05-04 DIAGNOSIS — Z7901 Long term (current) use of anticoagulants: Secondary | ICD-10-CM

## 2017-05-04 DIAGNOSIS — I10 Essential (primary) hypertension: Secondary | ICD-10-CM

## 2017-05-04 NOTE — Patient Instructions (Signed)

## 2017-05-04 NOTE — Progress Notes (Signed)
SUBJECTIVE: The patient presents for routine follow-up. She is an 83 year old woman with a history of restrictive diastolic heart failure, pacemaker, and atrial fibrillation. She also has aortic and mitral stenosis and mitral and tricuspid regurgitation.  Echocardiogram 10/01/16: Normal left ventricular systolic function, LVEF 60-65%, diastolic dysfunction with restrictive physiology, elevated filling pressures, wall motion abnormalities, mild to moderate aortic stenosis, mild mitral stenosis, moderate mitral regurgitation, mild to moderate tricuspid regurgitation, moderately reduced right ventricular systolic function, and moderate pulmonary hypertension.  Weight is down 11 pounds from last visit.  She drinks one Glucerna daily.  She denies chest pain. She is using oxygen. Chronic shortness of breath is stable. She denies leg swelling.  Her husband will be 67 tomorrow and the patient turns 88 next week. They got married when they were 83 years old.    Review of Systems: As per "subjective", otherwise negative.  No Known Allergies  Current Outpatient Prescriptions  Medication Sig Dispense Refill  . ALPRAZolam (XANAX) 0.5 MG tablet Take 0.5 mg by mouth at bedtime as needed for anxiety or sleep.    . Biotin 5000 MCG TABS Take by mouth daily. 10,000 mcg daily    . Calcium Carbonate-Vitamin D (CALTRATE 600+D PO) Take 1 tablet by mouth 2 (two) times daily.     . carvedilol (COREG) 12.5 MG tablet TAKE 1 TABLET BY MOUTH EVERY MORNING AND ONE-HALF TABLET BY MOUTH EVERY EVENING 45 tablet 1  . Cyanocobalamin (VITAMIN B-12 PO) Take 1 tablet by mouth daily.    Marland Kitchen diltiazem (CARDIZEM) 120 MG tablet Take 1 tablet (120 mg total) by mouth every morning. 30 tablet 6  . diltiazem (CARDIZEM) 90 MG tablet Take 1 tablet (90 mg total) by mouth every evening.    . donepezil (ARICEPT) 10 MG tablet Take 1 tablet by mouth at bedtime.  6  . feeding supplement, ENSURE COMPLETE, (ENSURE COMPLETE) LIQD Take  237 mLs by mouth 3 (three) times daily between meals. 90 Bottle 1  . furosemide (LASIX) 20 MG tablet Take 1 tablet (20 mg total) by mouth daily.    Marland Kitchen HYDROcodone-acetaminophen (NORCO/VICODIN) 5-325 MG tablet Take 1 tablet by mouth every 8 (eight) hours as needed for pain.  0  . mirtazapine (REMERON) 15 MG tablet Take 1 tablet by mouth at bedtime.  5  . Multiple Vitamin (MULTIVITAMIN) tablet Take 1 tablet by mouth daily.     Marland Kitchen NAMZARIC 28-10 MG CP24 take one capsule by mouth once daily  12  . omeprazole (PRILOSEC) 20 MG capsule Take 20 mg by mouth daily.    . ondansetron (ZOFRAN) 4 MG tablet Take 4 mg by mouth every 8 (eight) hours as needed for nausea or vomiting.    . potassium chloride (K-DUR) 10 MEQ tablet Take 1 tablet (10 mEq total) by mouth daily. 30 tablet 0  . warfarin (COUMADIN) 2 MG tablet TAKE 1 TABLET BY MOUTH AS DIRECTED 30 tablet 6   No current facility-administered medications for this visit.     Past Medical History:  Diagnosis Date  . Chronic atrial fibrillation (HCC)   . Chronic diastolic CHF (congestive heart failure) (HCC)   . CKD (chronic kidney disease), stage III   . Coronary artery disease   . Dementia   . Encounter for long-term (current) use of other medications    COUMADIN THERAPY  . Essential hypertension   . GERD (gastroesophageal reflux disease)   . Lower extremity weakness   . Pericardial effusion    a.  Echo 08/2014: mod pericardial effusion.  . Renal artery stenosis (HCC)    a. right renal artery stenosis (1-59% by duplex in 04/2013).  . Sinoatrial node dysfunction (HCC)     SINUS BRADYCARDIA  . Sinus bradycardia     Past Surgical History:  Procedure Laterality Date  . BACK SURGERY    . CARDIOVERSION     x2  . CHOLECYSTECTOMY    . COLONOSCOPY  05/2011   Dr. Allena Katz: normal. no bx.  Marland Kitchen KNEE ARTHROSCOPY    . PACEMAKER IMPLANT  04/30/2016   SJM Endurity VR PPM implanted by Dr Danella Maiers in [place]    Social History   Social History  .  Marital status: Married    Spouse name: [name]  . Number of children: 0  . Years of education: 12   Occupational History  . RETIRED   .  Retired   Social History Main Topics  . Smoking status: Never Smoker  . Smokeless tobacco: Never Used     Comment: Never smoked  . Alcohol use No  . Drug use: No  . Sexual activity: Not Currently   Other Topics Concern  . Not on file   Social History Narrative   Patient lives at home with her husband [name]) in [place].  Patient is retired. Patient has 12 th grade education.   Right handed.   Caffeine- two three cups of coffee daily.     Vitals:   05/04/17 1258  BP: 138/80  Pulse: 70  SpO2: 98%  Weight: 114 lb (51.7 kg)  Height:  (1.651 m)    Wt Readings from Last 3 Encounters:  05/04/17 114 lb (51.7 kg)  12/28/16 121 lb (54.9 kg)  11/02/16 125 lb (56.7 kg)     PHYSICAL EXAM General: NAD, chronically ill appearing HEENT: Normal. Neck: No JVD, no thyromegaly. Lungs: Clear to auscultation bilaterally with normal respiratory effort. CV: Regular rate andrhythm, normal S1/S2, no S3, II/VI pansystolic murmur along left sternal border. No significantpretibial edema b/l. Venous varicosities b/l. Abdomen: Soft, nontender, no distention.  Neurologic: Alert and oriented.  Psych: Normal affect. Skin: Normal. Musculoskeletal: No gross deformities.    ECG: Most recent ECG reviewed.   Labs: Lab Results  Component Value Date/Time   K 3.6 10/01/2016 03:23 PM   BUN 25 (H) 10/01/2016 03:23 PM   CREATININE 1.41 (H) 10/01/2016 03:23 PM   CREATININE 1.30 (H) 10/22/2014 12:30 PM   ALT 43 01/19/2015 06:23 AM   HGB 13.3 10/01/2016 03:23 PM     Lipids: No results found for: LDLCALC, LDLDIRECT, CHOL, TRIG, HDL     ASSESSMENT AND PLAN:  1. Chronic diastolic heart failure: Currently on Lasix 20 mg daily. She appears euvolemic. Her weight has significantly decreased since her last visit.  2. Chronic renal  insufficiency/Right renal artery stenosis: No plans for imaging at this time.  3. Atrial fibrillation: HR controlled on Coreg and diltiazem and is symptomatically stable. On warfarin for anticoagulation, followed here in our clinic.  4. Pacemaker: Enrolled in device clinic. Stable.  5. Aortic stenosis: Mild to moderate.  6. Mitral stenosis: Mild.  7. Pulmonary hypertension: Major contribution from restrictive diastolic dysfunction and possible interstitial lung disease.  8. Hypertension: Controlled. No changes. I personally review the blood pressure log and blood pressure has been stable.     Disposition: Follow up 6 months   Prentice Docker, M.D., F.A.C.C.

## 2017-05-13 ENCOUNTER — Ambulatory Visit (INDEPENDENT_AMBULATORY_CARE_PROVIDER_SITE_OTHER): Payer: Medicare Other | Admitting: *Deleted

## 2017-05-13 DIAGNOSIS — Z5181 Encounter for therapeutic drug level monitoring: Secondary | ICD-10-CM | POA: Diagnosis not present

## 2017-05-13 DIAGNOSIS — I482 Chronic atrial fibrillation, unspecified: Secondary | ICD-10-CM

## 2017-05-13 LAB — POCT INR: INR: 2.1

## 2017-05-18 ENCOUNTER — Other Ambulatory Visit: Payer: Self-pay | Admitting: Cardiovascular Disease

## 2017-05-28 ENCOUNTER — Other Ambulatory Visit: Payer: Self-pay | Admitting: Cardiovascular Disease

## 2017-05-28 DIAGNOSIS — I5032 Chronic diastolic (congestive) heart failure: Secondary | ICD-10-CM

## 2017-05-28 DIAGNOSIS — I701 Atherosclerosis of renal artery: Secondary | ICD-10-CM

## 2017-05-28 DIAGNOSIS — R609 Edema, unspecified: Secondary | ICD-10-CM

## 2017-05-28 DIAGNOSIS — I10 Essential (primary) hypertension: Secondary | ICD-10-CM

## 2017-05-28 DIAGNOSIS — I4821 Permanent atrial fibrillation: Secondary | ICD-10-CM

## 2017-05-28 DIAGNOSIS — N189 Chronic kidney disease, unspecified: Secondary | ICD-10-CM

## 2017-05-28 DIAGNOSIS — Z7901 Long term (current) use of anticoagulants: Secondary | ICD-10-CM

## 2017-06-03 ENCOUNTER — Other Ambulatory Visit: Payer: Self-pay | Admitting: Cardiovascular Disease

## 2017-06-17 ENCOUNTER — Ambulatory Visit (INDEPENDENT_AMBULATORY_CARE_PROVIDER_SITE_OTHER): Payer: Medicare Other | Admitting: *Deleted

## 2017-06-17 DIAGNOSIS — Z5181 Encounter for therapeutic drug level monitoring: Secondary | ICD-10-CM | POA: Diagnosis not present

## 2017-06-17 LAB — POCT INR: INR: 2.1

## 2017-06-28 ENCOUNTER — Other Ambulatory Visit: Payer: Self-pay | Admitting: Cardiovascular Disease

## 2017-06-28 DIAGNOSIS — I5032 Chronic diastolic (congestive) heart failure: Secondary | ICD-10-CM

## 2017-06-28 DIAGNOSIS — R609 Edema, unspecified: Secondary | ICD-10-CM

## 2017-06-28 DIAGNOSIS — N189 Chronic kidney disease, unspecified: Secondary | ICD-10-CM

## 2017-06-28 DIAGNOSIS — I701 Atherosclerosis of renal artery: Secondary | ICD-10-CM

## 2017-06-28 DIAGNOSIS — I4821 Permanent atrial fibrillation: Secondary | ICD-10-CM

## 2017-06-28 DIAGNOSIS — I10 Essential (primary) hypertension: Secondary | ICD-10-CM

## 2017-06-28 DIAGNOSIS — Z7901 Long term (current) use of anticoagulants: Secondary | ICD-10-CM

## 2017-07-16 ENCOUNTER — Encounter (HOSPITAL_COMMUNITY): Payer: Self-pay | Admitting: Emergency Medicine

## 2017-07-16 ENCOUNTER — Telehealth: Payer: Self-pay | Admitting: Cardiovascular Disease

## 2017-07-16 ENCOUNTER — Emergency Department (HOSPITAL_COMMUNITY): Payer: Medicare Other

## 2017-07-16 ENCOUNTER — Emergency Department (HOSPITAL_COMMUNITY)
Admission: EM | Admit: 2017-07-16 | Discharge: 2017-07-16 | Disposition: A | Discharge status: Home / Self Care | Payer: Medicare Other | Attending: Emergency Medicine | Admitting: Emergency Medicine

## 2017-07-16 ENCOUNTER — Other Ambulatory Visit: Payer: Self-pay

## 2017-07-16 DIAGNOSIS — F0390 Unspecified dementia without behavioral disturbance: Secondary | ICD-10-CM | POA: Diagnosis not present

## 2017-07-16 DIAGNOSIS — Z950 Presence of cardiac pacemaker: Secondary | ICD-10-CM | POA: Diagnosis not present

## 2017-07-16 DIAGNOSIS — R002 Palpitations: Secondary | ICD-10-CM

## 2017-07-16 DIAGNOSIS — Z79899 Other long term (current) drug therapy: Secondary | ICD-10-CM | POA: Diagnosis not present

## 2017-07-16 DIAGNOSIS — N183 Chronic kidney disease, stage 3 (moderate): Secondary | ICD-10-CM | POA: Insufficient documentation

## 2017-07-16 DIAGNOSIS — I4891 Unspecified atrial fibrillation: Secondary | ICD-10-CM | POA: Diagnosis not present

## 2017-07-16 DIAGNOSIS — Z7901 Long term (current) use of anticoagulants: Secondary | ICD-10-CM | POA: Diagnosis not present

## 2017-07-16 DIAGNOSIS — I5032 Chronic diastolic (congestive) heart failure: Secondary | ICD-10-CM | POA: Insufficient documentation

## 2017-07-16 DIAGNOSIS — I130 Hypertensive heart and chronic kidney disease with heart failure and stage 1 through stage 4 chronic kidney disease, or unspecified chronic kidney disease: Secondary | ICD-10-CM | POA: Insufficient documentation

## 2017-07-16 DIAGNOSIS — R000 Tachycardia, unspecified: Secondary | ICD-10-CM | POA: Diagnosis present

## 2017-07-16 LAB — MAGNESIUM: MAGNESIUM: 1.8 mg/dL (ref 1.7–2.4)

## 2017-07-16 LAB — CBC
HCT: 46.1 % — ABNORMAL HIGH (ref 36.0–46.0)
Hemoglobin: 13.6 g/dL (ref 12.0–15.0)
MCH: 28.7 pg (ref 26.0–34.0)
MCHC: 29.5 g/dL — ABNORMAL LOW (ref 30.0–36.0)
MCV: 97.3 fL (ref 78.0–100.0)
PLATELETS: 253 10*3/uL (ref 150–400)
RBC: 4.74 MIL/uL (ref 3.87–5.11)
RDW: 15.6 % — AB (ref 11.5–15.5)
WBC: 9 10*3/uL (ref 4.0–10.5)

## 2017-07-16 LAB — PROTIME-INR
INR: 1.98
Prothrombin Time: 22.3 seconds — ABNORMAL HIGH (ref 11.4–15.2)

## 2017-07-16 LAB — BASIC METABOLIC PANEL
Anion gap: 9 (ref 5–15)
BUN: 25 mg/dL — AB (ref 6–20)
CALCIUM: 9.5 mg/dL (ref 8.9–10.3)
CO2: 29 mmol/L (ref 22–32)
CREATININE: 1.06 mg/dL — AB (ref 0.44–1.00)
Chloride: 102 mmol/L (ref 101–111)
GFR calc non Af Amer: 46 mL/min — ABNORMAL LOW (ref >60)
GFR, EST AFRICAN AMERICAN: 54 mL/min — AB (ref >60)
Glucose, Bld: 154 mg/dL — ABNORMAL HIGH (ref 65–99)
Potassium: 4.1 mmol/L (ref 3.5–5.1)
SODIUM: 140 mmol/L (ref 135–145)

## 2017-07-16 LAB — URINALYSIS, ROUTINE W REFLEX MICROSCOPIC
BILIRUBIN URINE: NEGATIVE
GLUCOSE, UA: NEGATIVE mg/dL
Hgb urine dipstick: NEGATIVE
KETONES UR: NEGATIVE mg/dL
LEUKOCYTES UA: NEGATIVE
Nitrite: NEGATIVE
PH: 6 (ref 5.0–8.0)
PROTEIN: NEGATIVE mg/dL
Specific Gravity, Urine: 1.012 (ref 1.005–1.030)

## 2017-07-16 NOTE — Discharge Instructions (Signed)
Please call Dr. Darl Householder on Monday regarding medication changes for your blood pressure and atrial fibrillation.   You have episodes of atrial fibrillation with RVR on your pacemaker interrogation. Please discuss with Dr. Darl Householder regarding any potential changes.   Please return without fail for worsening symptoms, including confusion, passing out, difficulty breathing, chest pain or any other symptoms concerning to you.

## 2017-07-16 NOTE — Telephone Encounter (Signed)
Caregiver states BP is 173/123 HR 110-153 (HR checked via ascultation) O2 is 98%. Caregiver states this has been going on for over a month now but patient has just gotten very weak. Caregiver states patient gets up to eat and goes right back to bed. Caregiver and husband are very concerned regarding patients BP and HR. Caregiver also stated patient has been holding chest area, but when they ask if her chest hurts patient states it doesn't. Patient has not complained of any other symptoms. Caregiver stated patient has been breathing very heavy as well.

## 2017-07-16 NOTE — Telephone Encounter (Signed)
Caregiver notified. Taking patient to [facility]

## 2017-07-16 NOTE — ED Provider Notes (Addendum)
[facility] EMERGENCY DEPARTMENT Provider Note   CSN: [id number] Arrival date & time: 07/16/17  1215     History   Chief Complaint Chief Complaint  Patient presents with  . Tachycardia    HPI [name] is a 83 y.o. female.  HPI  83 year old female who presents with intermittent elevated blood pressure and tachycardia. Level V caveat due to dementia. History of chronic afib on coumadin, chronic diastolic CHF, CKD III, CAD, and sinus node dysfunction s/p SJM pacemaker. For the past few weeks, her caregiver reports she has had intermitted elevated BP and HR, typically in the morning. Symptoms improved later on in the days. They deny that she has be complaining of anything. Denies fever, cough, dyspnea, chest pain, abd pain, vomiting, diarrhea, melena, hematochezia, or urinary complaints. No fall or confusion. This morning, HR 110 and SbP 170. She was given her morning medications, and HR and BP has improved.   Past Medical History:  Diagnosis Date  . Chronic atrial fibrillation (HCC)   . Chronic diastolic CHF (congestive heart failure) (HCC)   . CKD (chronic kidney disease), stage III (HCC)   . Coronary artery disease   . Dementia   . Encounter for long-term (current) use of other medications    COUMADIN THERAPY  . Essential hypertension   . GERD (gastroesophageal reflux disease)   . Lower extremity weakness   . Pericardial effusion    a. Echo 08/2014: mod pericardial effusion.  . Renal artery stenosis (HCC)    a. right renal artery stenosis (1-59% by duplex in 04/2013).  . Sinoatrial node dysfunction (HCC)     SINUS BRADYCARDIA  . Sinus bradycardia     Patient Active Problem List   Diagnosis Date Noted  . Essential hypertension 07/02/2015  . Chronic atrial fibrillation (HCC) 07/02/2015  . CKD (chronic kidney disease), stage III (HCC) 07/02/2015  . Renal artery stenosis (HCC) 07/02/2015  . Elevated transaminase level 01/18/2015  . FTT (failure to thrive) in adult  01/17/2015  . Hyperkalemia   . Protein-calorie malnutrition, severe (HCC) 09/04/2014  . Dementia 09/03/2014  . GERD without esophagitis 09/03/2014  . Anxiety 09/03/2014  . Loss of weight 09/03/2014  . Physical deconditioning 09/03/2014  . C. difficile diarrhea 05/11/2014  . C. difficile colitis 04/13/2014  . Chronic respiratory failure with hypoxia (HCC) 04/13/2014  . Chronic diarrhea 03/05/2014  . Encounter for therapeutic drug monitoring 09/15/2013  . Gait difficulty 05/18/2013  . Chronic diastolic heart failure (HCC) 02/17/2013  . Chronic renal insufficiency   . Peripheral edema 02/03/2013  . Dysphagia 01/12/2011  . Dilated cardiomyopathy (HCC) 11/04/2010  . Mitral regurgitation 11/04/2010  . PFO (patent foramen ovale) 11/04/2010  . Chronic anticoagulation 11/04/2010  . Long term (current) use of anticoagulants 10/30/2010  . COMPRESSION FRACTURE, SPINE 10/10/2010  . ATHEROSCLEROSIS OF RENAL ARTERY 09/04/2010  . HTN (hypertension) 05/27/2009  . SINUS BRADYCARDIA 05/27/2009    Past Surgical History:  Procedure Laterality Date  . BACK SURGERY    . CARDIOVERSION     x2  . CHOLECYSTECTOMY    . COLONOSCOPY  05/2011   Dr. Allena Katz: normal. no bx.  Marland Kitchen KNEE ARTHROSCOPY    . PACEMAKER IMPLANT  04/30/2016   SJM Endurity VR PPM implanted by Dr Danella Maiers in [place]    OB History    No data available       Home Medications    Prior to Admission medications   Medication Sig Start Date End Date Taking?  Authorizing Provider  ALPRAZolam Prudy Feeler) 0.5 MG tablet Take 0.5 mg by mouth at bedtime as needed for anxiety or sleep.   Yes [provider]  Biotin 5000 MCG TABS Take by mouth daily. 10,000 mcg daily   Yes [provider]  Calcium Carbonate-Vitamin D (CALTRATE 600+D PO) Take 1 tablet by mouth 2 (two) times daily.    Yes [provider]  carvedilol (COREG) 12.5 MG tablet Take 6.25-12.5 mg by mouth 2 (two) times daily with a meal. Take 12.5 mg in  the morning and 6.25 mg in the evening.   Yes [provider]  Cyanocobalamin (VITAMIN B-12 PO) Take 1 tablet by mouth daily.   Yes [provider]  diltiazem (CARDIZEM) 120 MG tablet Take 1 tablet (120 mg total) by mouth every morning. 02/25/17  Yes Laqueta Linden, MD  diltiazem (CARDIZEM) 90 MG tablet Take 1 tablet (90 mg total) by mouth every evening. 02/25/17  Yes Laqueta Linden, MD  donepezil (ARICEPT) 10 MG tablet Take 1 tablet by mouth at bedtime. 08/06/14  Yes [provider]  feeding supplement, GLUCERNA SHAKE, (GLUCERNA SHAKE) LIQD Take 237 mLs by mouth 3 (three) times daily between meals.   Yes [provider]  furosemide (LASIX) 20 MG tablet Take 1 tablet (20 mg total) by mouth daily. 08/31/16  Yes Laqueta Linden, MD  HYDROcodone-acetaminophen (NORCO/VICODIN) 5-325 MG tablet Take 1 tablet by mouth every 8 (eight) hours as needed for pain. 12/01/16  Yes [provider]  mirtazapine (REMERON) 15 MG tablet Take 1 tablet by mouth at bedtime. 05/31/15  Yes [provider]  Multiple Vitamin (MULTIVITAMIN) tablet Take 1 tablet by mouth daily.    Yes [provider]  [facility] 28-10 MG CP24 take one capsule by mouth once daily 06/16/16  Yes [provider]  omeprazole (PRILOSEC) 20 MG capsule Take 20 mg by mouth daily.   Yes [provider]  potassium chloride (K-DUR) 10 MEQ tablet Take 1 tablet (10 mEq total) by mouth daily. 06/18/15  Yes Laqueta Linden, MD  warfarin (COUMADIN) 2 MG tablet Take 2 mg by mouth daily at 6 PM.   Yes [provider]    Family History Family History  Problem Relation Age of Onset  . Heart failure Mother   . High blood pressure Mother   . Cancer Brother   . Cirrhosis Other     Social History Social History   Tobacco Use  . Smoking status: Never Smoker  . Smokeless tobacco: Never Used  . Tobacco comment: Never smoked  Substance Use Topics  .  Alcohol use: No    Alcohol/week: 0.0 oz  . Drug use: No     Allergies   Patient has no known allergies.   Review of Systems Review of Systems  Constitutional: Negative for fever.  Respiratory: Negative for shortness of breath.   Cardiovascular: Negative for chest pain.  All other systems reviewed and are negative.    Physical Exam Updated Vital Signs BP (!) 152/106   Pulse 61   Temp 97.8 F (36.6 C) (Oral)   Resp 16   Ht 5\' 6"  (1.676 m)   Wt 52.2 kg (115 lb)   SpO2 98%   BMI 18.56 kg/m   Physical Exam Physical Exam  Nursing note and vitals reviewed. Constitutional: Well developed, well nourished, non-toxic, and in no acute distress Head: Normocephalic and atraumatic.  Mouth/Throat: Oropharynx is clear and moist.  Neck: Normal range of motion. Neck  supple.  Cardiovascular: Normal rate and regular rhythm.   Pulmonary/Chest: Effort normal and breath sounds normal.  Abdominal: Soft. There is no tenderness. There is no rebound and no guarding.  Musculoskeletal: Normal range of motion. No edema. Neurological: Alert, no facial droop, fluent speech, moves all extremities symmetrically Skin: Skin is warm and dry.  Psychiatric: Cooperative   ED Treatments / Results  Labs (all labs ordered are listed, but only abnormal results are displayed) Labs Reviewed  BASIC METABOLIC PANEL - Abnormal; Notable for the following components:      Result Value   Glucose, Bld 154 (*)    BUN 25 (*)    Creatinine, Ser 1.06 (*)    GFR calc non Af Amer 46 (*)    GFR calc Af Amer 54 (*)    All other components within normal limits  CBC - Abnormal; Notable for the following components:   HCT 46.1 (*)    MCHC 29.5 (*)    RDW 15.6 (*)    All other components within normal limits  PROTIME-INR - Abnormal; Notable for the following components:   Prothrombin Time 22.3 (*)    All other components within normal limits  MAGNESIUM  URINALYSIS, ROUTINE W REFLEX MICROSCOPIC    EKG  EKG  Interpretation  Date/Time:  Friday July 16 2017 12:26:58 EST Ventricular Rate:  80 PR Interval:    QRS Duration: 104 QT Interval:  389 QTC Calculation: 449 R Axis:   -28 Text Interpretation:  Atrial fibrillation Ventricular premature complex Borderline left axis deviation Borderline T abnormalities, diffuse leads Confirmed by Crista Curb [phone]) on 07/16/2017 12:52:32 PM       Radiology Dg Chest 2 View  Result Date: 07/16/2017 CLINICAL DATA:  Tachycardia EXAM: CHEST  2 VIEW COMPARISON:  06/01/2017 FINDINGS: Cardiac shadow is stable. Pacing device is again seen. Changes of prior vertebral augmentation are again noted. The lungs are clear bilaterally. No acute bony abnormality is noted. Aortic atherosclerotic changes are seen. IMPRESSION: No acute abnormality noted. Electronically Signed   By: Alcide Clever M.D.   On: 07/16/2017 13:55    Procedures Procedures (including critical care time)  Medications Ordered in ED Medications - No data to display   Initial Impression / Assessment and Plan / ED Course  I have reviewed the triage vital signs and the nursing notes.  Pertinent labs & imaging results that were available during my care of the patient were reviewed by me and considered in my medical decision making (see chart for details).     83 year old female with history of atrial fibrillation who presents with intermittent elevated heart rate and blood pressures at home.  On presentation here, she is in A. fib but rate controlled, mildly hypertensive.  It seems that her rate and blood pressure does respond to home medications after taking them.  She is asymptomatic.  Blood work shows no major electrolyte or metabolic derangements.  Chest x-ray visualized and shows no acute cardiopulmonary processes.  UA is unremarkable.  Patient family given reassurance.  Pacemaker interrogated. She has 2 episodes of afib/atrail tachycardia with RVR.   Given that she is asymptomatic, and vital signs  are improved here in the ED I feel that she can continue to to have outpatient management by Dr. Purvis Sheffield.  They will call his office to arrange follow-up regarding medication management.  Final Clinical Impressions(s) / ED Diagnoses   Final diagnoses:  Palpitations    ED Discharge Orders    None  Lavera GuiseLiu, Ikhlas Albo Duo, MD 07/16/17 2244    Lavera GuiseLiu, Darrell Leonhardt Duo, MD 07/17/17 1225

## 2017-07-16 NOTE — Telephone Encounter (Signed)
I would suggest she go to the ED for further evaluation.

## 2017-07-16 NOTE — Telephone Encounter (Signed)
Caregiver called stating that patient continues to have fatigue, HR is high, BP 174/23 HR 110 at 800am  Please call [phone].

## 2017-07-16 NOTE — ED Triage Notes (Addendum)
Per pt caregiver, pt heart rate and bp have been elevated intermittently for last several weeks. Pt denies pain. Pt family reports pt has been seen for same in [place], no known cause. Pt wears 2 liters o2 as needed and fractured left arm x6 weeks ago.  Pt family unsure what type of pacemaker was inserted in March or when it was last interrogated.

## 2017-07-17 ENCOUNTER — Telehealth: Payer: Self-pay | Admitting: Cardiology

## 2017-07-17 NOTE — Telephone Encounter (Signed)
Called by a person who described herself as the pt's "caregiver". She was upset because nothing was done for Ms Suto when she was brought to the ED yesterday. She went on the advice of Dr Jerrye Noble for high HR and B/P though that apparently wasn't the case when they went top the ED. The person I spoke to did not know Ms Boudreau medications. She admitted that the pt was asymptomatic and that the pt's HR and B/P were actually better than yesterday. I told I would be unable to help her over the phone. I suggested IF the pt became symptomatic she could take her to the ED.   Corine Shelter PA-C 07/17/2017 4:39 PM

## 2017-07-22 ENCOUNTER — Ambulatory Visit (INDEPENDENT_AMBULATORY_CARE_PROVIDER_SITE_OTHER): Payer: Medicare Other | Admitting: *Deleted

## 2017-07-22 DIAGNOSIS — I4891 Unspecified atrial fibrillation: Secondary | ICD-10-CM | POA: Diagnosis not present

## 2017-07-22 DIAGNOSIS — Z5181 Encounter for therapeutic drug level monitoring: Secondary | ICD-10-CM | POA: Diagnosis not present

## 2017-07-22 LAB — POCT INR: INR: 2.3

## 2017-07-31 ENCOUNTER — Other Ambulatory Visit: Payer: Self-pay

## 2017-07-31 ENCOUNTER — Inpatient Hospital Stay (HOSPITAL_COMMUNITY)
Admission: EM | Admit: 2017-07-31 | Discharge: 2017-09-10 | DRG: 871 | Disposition: E | Payer: Medicare Other | Attending: Family Medicine | Admitting: Family Medicine

## 2017-07-31 ENCOUNTER — Emergency Department (HOSPITAL_COMMUNITY): Payer: Medicare Other

## 2017-07-31 ENCOUNTER — Inpatient Hospital Stay (HOSPITAL_COMMUNITY): Payer: Medicare Other

## 2017-07-31 ENCOUNTER — Encounter (HOSPITAL_COMMUNITY): Payer: Self-pay | Admitting: Emergency Medicine

## 2017-07-31 DIAGNOSIS — I481 Persistent atrial fibrillation: Secondary | ICD-10-CM | POA: Diagnosis present

## 2017-07-31 DIAGNOSIS — J9621 Acute and chronic respiratory failure with hypoxia: Secondary | ICD-10-CM | POA: Diagnosis present

## 2017-07-31 DIAGNOSIS — I482 Chronic atrial fibrillation: Secondary | ICD-10-CM | POA: Diagnosis present

## 2017-07-31 DIAGNOSIS — E870 Hyperosmolality and hypernatremia: Secondary | ICD-10-CM | POA: Diagnosis not present

## 2017-07-31 DIAGNOSIS — J189 Pneumonia, unspecified organism: Secondary | ICD-10-CM

## 2017-07-31 DIAGNOSIS — Z66 Do not resuscitate: Secondary | ICD-10-CM | POA: Diagnosis not present

## 2017-07-31 DIAGNOSIS — A419 Sepsis, unspecified organism: Principal | ICD-10-CM

## 2017-07-31 DIAGNOSIS — I959 Hypotension, unspecified: Secondary | ICD-10-CM | POA: Diagnosis present

## 2017-07-31 DIAGNOSIS — I5032 Chronic diastolic (congestive) heart failure: Secondary | ICD-10-CM

## 2017-07-31 DIAGNOSIS — I701 Atherosclerosis of renal artery: Secondary | ICD-10-CM | POA: Diagnosis present

## 2017-07-31 DIAGNOSIS — J690 Pneumonitis due to inhalation of food and vomit: Secondary | ICD-10-CM | POA: Diagnosis present

## 2017-07-31 DIAGNOSIS — R0902 Hypoxemia: Secondary | ICD-10-CM

## 2017-07-31 DIAGNOSIS — T17908A Unspecified foreign body in respiratory tract, part unspecified causing other injury, initial encounter: Secondary | ICD-10-CM | POA: Diagnosis not present

## 2017-07-31 DIAGNOSIS — N179 Acute kidney failure, unspecified: Secondary | ICD-10-CM | POA: Diagnosis present

## 2017-07-31 DIAGNOSIS — N390 Urinary tract infection, site not specified: Secondary | ICD-10-CM | POA: Diagnosis not present

## 2017-07-31 DIAGNOSIS — R6889 Other general symptoms and signs: Secondary | ICD-10-CM

## 2017-07-31 DIAGNOSIS — R627 Adult failure to thrive: Secondary | ICD-10-CM | POA: Diagnosis present

## 2017-07-31 DIAGNOSIS — Q211 Atrial septal defect: Secondary | ICD-10-CM | POA: Diagnosis not present

## 2017-07-31 DIAGNOSIS — Z7901 Long term (current) use of anticoagulants: Secondary | ICD-10-CM

## 2017-07-31 DIAGNOSIS — I130 Hypertensive heart and chronic kidney disease with heart failure and stage 1 through stage 4 chronic kidney disease, or unspecified chronic kidney disease: Secondary | ICD-10-CM | POA: Diagnosis present

## 2017-07-31 DIAGNOSIS — Z515 Encounter for palliative care: Secondary | ICD-10-CM | POA: Diagnosis not present

## 2017-07-31 DIAGNOSIS — J181 Lobar pneumonia, unspecified organism: Secondary | ICD-10-CM

## 2017-07-31 DIAGNOSIS — T502X5A Adverse effect of carbonic-anhydrase inhibitors, benzothiadiazides and other diuretics, initial encounter: Secondary | ICD-10-CM | POA: Diagnosis not present

## 2017-07-31 DIAGNOSIS — N183 Chronic kidney disease, stage 3 (moderate): Secondary | ICD-10-CM | POA: Diagnosis present

## 2017-07-31 DIAGNOSIS — E860 Dehydration: Secondary | ICD-10-CM | POA: Diagnosis present

## 2017-07-31 DIAGNOSIS — I5033 Acute on chronic diastolic (congestive) heart failure: Secondary | ICD-10-CM | POA: Diagnosis not present

## 2017-07-31 DIAGNOSIS — Z8249 Family history of ischemic heart disease and other diseases of the circulatory system: Secondary | ICD-10-CM

## 2017-07-31 DIAGNOSIS — R1310 Dysphagia, unspecified: Secondary | ICD-10-CM | POA: Diagnosis present

## 2017-07-31 DIAGNOSIS — Z9981 Dependence on supplemental oxygen: Secondary | ICD-10-CM

## 2017-07-31 DIAGNOSIS — G9341 Metabolic encephalopathy: Secondary | ICD-10-CM | POA: Diagnosis present

## 2017-07-31 DIAGNOSIS — I4821 Permanent atrial fibrillation: Secondary | ICD-10-CM

## 2017-07-31 DIAGNOSIS — E876 Hypokalemia: Secondary | ICD-10-CM | POA: Diagnosis not present

## 2017-07-31 DIAGNOSIS — G9340 Encephalopathy, unspecified: Secondary | ICD-10-CM

## 2017-07-31 DIAGNOSIS — T17908D Unspecified foreign body in respiratory tract, part unspecified causing other injury, subsequent encounter: Secondary | ICD-10-CM | POA: Diagnosis not present

## 2017-07-31 DIAGNOSIS — K219 Gastro-esophageal reflux disease without esophagitis: Secondary | ICD-10-CM | POA: Diagnosis present

## 2017-07-31 DIAGNOSIS — R531 Weakness: Secondary | ICD-10-CM | POA: Diagnosis present

## 2017-07-31 DIAGNOSIS — Z7189 Other specified counseling: Secondary | ICD-10-CM | POA: Diagnosis not present

## 2017-07-31 DIAGNOSIS — B9689 Other specified bacterial agents as the cause of diseases classified elsewhere: Secondary | ICD-10-CM | POA: Diagnosis present

## 2017-07-31 DIAGNOSIS — Z452 Encounter for adjustment and management of vascular access device: Secondary | ICD-10-CM

## 2017-07-31 DIAGNOSIS — F0390 Unspecified dementia without behavioral disturbance: Secondary | ICD-10-CM

## 2017-07-31 DIAGNOSIS — I509 Heart failure, unspecified: Secondary | ICD-10-CM

## 2017-07-31 DIAGNOSIS — I2510 Atherosclerotic heart disease of native coronary artery without angina pectoris: Secondary | ICD-10-CM | POA: Diagnosis present

## 2017-07-31 DIAGNOSIS — Z950 Presence of cardiac pacemaker: Secondary | ICD-10-CM

## 2017-07-31 DIAGNOSIS — T17908S Unspecified foreign body in respiratory tract, part unspecified causing other injury, sequela: Secondary | ICD-10-CM | POA: Diagnosis not present

## 2017-07-31 LAB — INFLUENZA PANEL BY PCR (TYPE A & B)
Influenza A By PCR: NEGATIVE
Influenza B By PCR: NEGATIVE

## 2017-07-31 LAB — COMPREHENSIVE METABOLIC PANEL
ALT: 27 U/L (ref 14–54)
AST: 51 U/L — AB (ref 15–41)
Albumin: 3.4 g/dL — ABNORMAL LOW (ref 3.5–5.0)
Alkaline Phosphatase: 90 U/L (ref 38–126)
Anion gap: 14 (ref 5–15)
BUN: 38 mg/dL — ABNORMAL HIGH (ref 6–20)
CHLORIDE: 102 mmol/L (ref 101–111)
CO2: 26 mmol/L (ref 22–32)
CREATININE: 1.62 mg/dL — AB (ref 0.44–1.00)
Calcium: 9.5 mg/dL (ref 8.9–10.3)
GFR, EST AFRICAN AMERICAN: 32 mL/min — AB (ref >60)
GFR, EST NON AFRICAN AMERICAN: 28 mL/min — AB (ref >60)
Glucose, Bld: 135 mg/dL — ABNORMAL HIGH (ref 65–99)
POTASSIUM: 4.9 mmol/L (ref 3.5–5.1)
SODIUM: 142 mmol/L (ref 135–145)
Total Bilirubin: 1.4 mg/dL — ABNORMAL HIGH (ref 0.3–1.2)
Total Protein: 6.6 g/dL (ref 6.5–8.1)

## 2017-07-31 LAB — CBC WITH DIFFERENTIAL/PLATELET
BASOS ABS: 0 10*3/uL (ref 0.0–0.1)
Basophils Relative: 0 %
EOS ABS: 0 10*3/uL (ref 0.0–0.7)
Eosinophils Relative: 0 %
HCT: 40.3 % (ref 36.0–46.0)
Hemoglobin: 12.3 g/dL (ref 12.0–15.0)
Lymphocytes Relative: 5 %
Lymphs Abs: 0.8 10*3/uL (ref 0.7–4.0)
MCH: 28.7 pg (ref 26.0–34.0)
MCHC: 30.5 g/dL (ref 30.0–36.0)
MCV: 93.9 fL (ref 78.0–100.0)
MONO ABS: 0.8 10*3/uL (ref 0.1–1.0)
Monocytes Relative: 5 %
Neutro Abs: 14.7 10*3/uL — ABNORMAL HIGH (ref 1.7–7.7)
Neutrophils Relative %: 90 %
Platelets: 199 10*3/uL (ref 150–400)
RBC: 4.29 MIL/uL (ref 3.87–5.11)
RDW: 16.2 % — AB (ref 11.5–15.5)
WBC: 16.3 10*3/uL — AB (ref 4.0–10.5)

## 2017-07-31 LAB — URINALYSIS, ROUTINE W REFLEX MICROSCOPIC
BILIRUBIN URINE: NEGATIVE
GLUCOSE, UA: NEGATIVE mg/dL
HGB URINE DIPSTICK: NEGATIVE
KETONES UR: NEGATIVE mg/dL
Nitrite: NEGATIVE
PH: 5 (ref 5.0–8.0)
PROTEIN: NEGATIVE mg/dL
Specific Gravity, Urine: 1.016 (ref 1.005–1.030)

## 2017-07-31 LAB — STREP PNEUMONIAE URINARY ANTIGEN: Strep Pneumo Urinary Antigen: NEGATIVE

## 2017-07-31 LAB — PROCALCITONIN: Procalcitonin: 2.44 ng/mL

## 2017-07-31 LAB — TROPONIN I

## 2017-07-31 LAB — PROTIME-INR
INR: 2.23
PROTHROMBIN TIME: 24.5 s — AB (ref 11.4–15.2)

## 2017-07-31 LAB — I-STAT CG4 LACTIC ACID, ED: LACTIC ACID, VENOUS: 1.54 mmol/L (ref 0.5–1.9)

## 2017-07-31 LAB — APTT: APTT: 49 s — AB (ref 24–36)

## 2017-07-31 LAB — LACTIC ACID, PLASMA: Lactic Acid, Venous: 0.8 mmol/L (ref 0.5–1.9)

## 2017-07-31 LAB — MRSA PCR SCREENING: MRSA BY PCR: NEGATIVE

## 2017-07-31 MED ORDER — ALPRAZOLAM 0.5 MG PO TABS
0.5000 mg | ORAL_TABLET | Freq: Once | ORAL | Status: AC
  Administered 2017-07-31: 0.5 mg via ORAL
  Filled 2017-07-31: qty 1

## 2017-07-31 MED ORDER — DONEPEZIL HCL 5 MG PO TABS
10.0000 mg | ORAL_TABLET | Freq: Every day | ORAL | Status: DC
  Administered 2017-07-31 – 2017-08-12 (×8): 10 mg via ORAL
  Filled 2017-07-31 (×8): qty 2

## 2017-07-31 MED ORDER — MEMANTINE HCL ER 28 MG PO CP24
28.0000 mg | ORAL_CAPSULE | Freq: Every day | ORAL | Status: DC
  Administered 2017-07-31 – 2017-08-05 (×2): 28 mg via ORAL
  Filled 2017-07-31 (×10): qty 1

## 2017-07-31 MED ORDER — GLUCERNA SHAKE PO LIQD
237.0000 mL | Freq: Three times a day (TID) | ORAL | Status: DC
  Administered 2017-07-31 – 2017-08-13 (×13): 237 mL via ORAL

## 2017-07-31 MED ORDER — DEXTROSE 5 % IV SOLN: 500.0000 mg | Freq: Once | INTRAVENOUS | Status: DC

## 2017-07-31 MED ORDER — HALOPERIDOL LACTATE 5 MG/ML IJ SOLN
5.0000 mg | Freq: Four times a day (QID) | INTRAMUSCULAR | Status: DC | PRN
  Administered 2017-07-31 – 2017-08-05 (×7): 5 mg via INTRAVENOUS
  Filled 2017-07-31 (×7): qty 1

## 2017-07-31 MED ORDER — SODIUM CHLORIDE 0.9 % IV SOLN
INTRAVENOUS | Status: DC
  Administered 2017-07-31 – 2017-08-01 (×3): via INTRAVENOUS

## 2017-07-31 MED ORDER — ORAL CARE MOUTH RINSE
15.0000 mL | Freq: Two times a day (BID) | OROMUCOSAL | Status: DC
  Administered 2017-07-31 – 2017-08-13 (×24): 15 mL via OROMUCOSAL

## 2017-07-31 MED ORDER — ENSURE ENLIVE PO LIQD: 237.0000 mL | Freq: Two times a day (BID) | ORAL | Status: DC

## 2017-07-31 MED ORDER — ONDANSETRON HCL 4 MG/2ML IJ SOLN
4.0000 mg | Freq: Once | INTRAMUSCULAR | Status: AC
  Administered 2017-07-31: 4 mg via INTRAVENOUS
  Filled 2017-07-31: qty 2

## 2017-07-31 MED ORDER — CEFTRIAXONE SODIUM 1 G IJ SOLR
1.0000 g | Freq: Once | INTRAMUSCULAR | Status: AC
  Administered 2017-07-31: 1 g via INTRAVENOUS
  Filled 2017-07-31: qty 10

## 2017-07-31 MED ORDER — DEXTROSE 5 % IV SOLN
500.0000 mg | INTRAVENOUS | Status: DC
  Administered 2017-08-01 – 2017-08-04 (×4): 500 mg via INTRAVENOUS
  Filled 2017-07-31 (×5): qty 500

## 2017-07-31 MED ORDER — ONDANSETRON HCL 4 MG PO TABS
4.0000 mg | ORAL_TABLET | Freq: Four times a day (QID) | ORAL | Status: DC | PRN
  Filled 2017-07-31: qty 1

## 2017-07-31 MED ORDER — ACETAMINOPHEN 325 MG PO TABS: 650.0000 mg | ORAL_TABLET | Freq: Four times a day (QID) | ORAL | Status: DC | PRN

## 2017-07-31 MED ORDER — WARFARIN - PHARMACIST DOSING INPATIENT: Freq: Every day | Status: DC

## 2017-07-31 MED ORDER — MEMANTINE HCL-DONEPEZIL HCL ER 28-10 MG PO CP24: 1.0000 | ORAL_CAPSULE | Freq: Every day | ORAL | Status: DC

## 2017-07-31 MED ORDER — SODIUM CHLORIDE 0.9 % IV SOLN
1000.0000 mL | INTRAVENOUS | Status: DC
  Administered 2017-07-31: 1000 mL via INTRAVENOUS

## 2017-07-31 MED ORDER — ONDANSETRON HCL 4 MG/2ML IJ SOLN
4.0000 mg | Freq: Four times a day (QID) | INTRAMUSCULAR | Status: DC | PRN
  Filled 2017-07-31: qty 2

## 2017-07-31 MED ORDER — PANTOPRAZOLE SODIUM 40 MG PO TBEC
40.0000 mg | DELAYED_RELEASE_TABLET | Freq: Every day | ORAL | Status: DC
  Administered 2017-07-31 – 2017-08-05 (×2): 40 mg via ORAL
  Filled 2017-07-31 (×3): qty 1

## 2017-07-31 MED ORDER — ACETAMINOPHEN 650 MG RE SUPP: 650.0000 mg | Freq: Four times a day (QID) | RECTAL | Status: DC | PRN

## 2017-07-31 MED ORDER — PIPERACILLIN-TAZOBACTAM 3.375 G IVPB
3.3750 g | Freq: Three times a day (TID) | INTRAVENOUS | Status: DC
  Administered 2017-07-31 – 2017-08-06 (×18): 3.375 g via INTRAVENOUS
  Filled 2017-07-31 (×17): qty 50

## 2017-07-31 MED ORDER — DEXTROSE 5 % IV SOLN
500.0000 mg | Freq: Once | INTRAVENOUS | Status: AC
  Administered 2017-07-31: 500 mg via INTRAVENOUS
  Filled 2017-07-31: qty 500

## 2017-07-31 MED ORDER — DEXTROSE 5 % IV SOLN
1.0000 g | INTRAVENOUS | Status: DC
  Filled 2017-07-31 (×2): qty 10

## 2017-07-31 MED ORDER — ADULT MULTIVITAMIN W/MINERALS CH
1.0000 | ORAL_TABLET | Freq: Every day | ORAL | Status: DC
  Administered 2017-07-31 – 2017-08-13 (×8): 1 via ORAL
  Filled 2017-07-31 (×13): qty 1

## 2017-07-31 MED ORDER — DEXTROSE 5 % IV SOLN
1.0000 g | Freq: Once | INTRAVENOUS | Status: AC
  Administered 2017-07-31: 1 g via INTRAVENOUS
  Filled 2017-07-31: qty 10

## 2017-07-31 MED ORDER — WARFARIN SODIUM 2 MG PO TABS
2.0000 mg | ORAL_TABLET | Freq: Once | ORAL | Status: AC
Start: 2017-07-31 — End: 2017-07-31
  Administered 2017-07-31: 2 mg via ORAL
  Filled 2017-07-31 (×2): qty 1

## 2017-07-31 NOTE — ED Triage Notes (Addendum)
Pt's husband states pt having dry cough, vomiting and weakness since last night. Pt was seen at PCP last week and placed on antibiotics.  Caregiver states that pt's urine has a strong odor.  HX of dementia

## 2017-07-31 NOTE — H&P (Signed)
History and Physical  [name] WUJ:[id number] DOB: 01/21/1929 DOA: Aug 07, 2017   PCP: Ignatius Specking, MD   Patient coming from: Home  Chief Complaint: Generalized weakness, and cough  HPI:  [name] is a 83 y.o. female with medical history of chronic respiratory failure on 2 L, dementia, symptomatic bradycardia status post PPM, permanent atrial fibrillation, diastolic CHF presents with 3-4-day history of cough and generalized weakness.  On the morning of admission, the patient was significantly weaker and more somnolent requiring maximal assistance to transfer from her bed.  As a result, EMS was activated.  The patient is unable to provide any history secondary to her encephalopathy.  History is obtained from speaking with the patient's family and reviewing the medical record.  The patient's husband stated that she had vomited earlier in the morning without any blood.  She has not had any diarrhea, hematochezia, melena.   there has not been any complaints of chest pain, headache, neck pain, dysuria, hematuria.  In the emergency department, the patient was afebrile with soft blood pressures.  WBC was 16.3.  Lactic acid was 1.4.  Chest x-ray showed bilateral airspace opacities, right greater than left.  The patient was started on ceftriaxone and azithromycin and IV fluids.  Assessment/Plan: Sepsis  -secondary to pneumonia, concerned about aspiration -Lactic acid 1.54-->trend -Procalcitonin -Start Zosyn -Continue azithromycin -IV fluids -check influenza  Lobar pneumonia -Concerned about aspiration -Speech therapy evaluation -Start Zosyn, continue azithromycin -Urine Streptococcus pneumoniae antigen -Urine Legionella antigen  Acute on chronic respiratory failure with hypoxia -Normally on 2 L -Presently on 4 L -Wean oxygen for saturation greater than 92%  Acute on chronic renal failure--CKD3 -baseline creatinine 1.0-1.3 -presenting creatinine 1.62 -due to  sepsis and volume depletion  UTI -UA with TNTC WBC -continue zosyn pending culture  Acute metabolic encephalopathy -Secondary to sepsis  Hypertension -Holding diltiazem and Coreg secondary to soft blood pressures  Permanent atrial fibrillation with RVR -RVR secondary to sepsis -Continue Coumadin -Anticipate improvement with treatment of sepsis and IV fluids  Symptomatic bradycardia -s/p PPM  Dementia -continue Namzaric  Chronic diastolic CHF -Holding Lasix secondary to hypotension and sepsis -Reevaluate December 23 to restart -Daily weights -08/07/2017 echo EF 60-65%       Past Medical History:  Diagnosis Date  . Chronic atrial fibrillation (HCC)   . Chronic diastolic CHF (congestive heart failure) (HCC)   . CKD (chronic kidney disease), stage III (HCC)   . Coronary artery disease   . Dementia   . Encounter for long-term (current) use of other medications    COUMADIN THERAPY  . Essential hypertension   . GERD (gastroesophageal reflux disease)   . Lower extremity weakness   . Pericardial effusion    a. Echo 08/2014: mod pericardial effusion.  . Renal artery stenosis (HCC)    a. right renal artery stenosis (1-59% by duplex in 04/2013).  . Sinoatrial node dysfunction (HCC)     SINUS BRADYCARDIA  . Sinus bradycardia    Past Surgical History:  Procedure Laterality Date  . BACK SURGERY    . CARDIOVERSION     x2  . CHOLECYSTECTOMY    . COLONOSCOPY  05/2011   Dr. Allena Katz: normal. no bx.  Marland Kitchen KNEE ARTHROSCOPY    . PACEMAKER IMPLANT  04/30/2016   SJM Endurity VR PPM implanted by Dr Danella Maiers in [place]   Social History:  reports that  has never smoked. she has never used smokeless tobacco. She  reports that she does not drink alcohol or use drugs.   Family History  Problem Relation Age of Onset  . Heart failure Mother   . High blood pressure Mother   . Cancer Brother   . Cirrhosis Other      No Known Allergies   Prior to Admission  medications   Medication Sig Start Date End Date Taking? Authorizing Provider  ALPRAZolam Prudy Feeler) 0.5 MG tablet Take 0.5 mg by mouth at bedtime as needed for anxiety or sleep.    [provider]  Biotin 5000 MCG TABS Take by mouth daily. 10,000 mcg daily    [provider]  Calcium Carbonate-Vitamin D (CALTRATE 600+D PO) Take 1 tablet by mouth 2 (two) times daily.     [provider]  carvedilol (COREG) 12.5 MG tablet Take 6.25-12.5 mg by mouth 2 (two) times daily with a meal. Take 12.5 mg in the morning and 6.25 mg in the evening.    [provider]  Cyanocobalamin (VITAMIN B-12 PO) Take 1 tablet by mouth daily.    [provider]  diltiazem (CARDIZEM) 120 MG tablet Take 1 tablet (120 mg total) by mouth every morning. 02/25/17   Laqueta Linden, MD  diltiazem (CARDIZEM) 90 MG tablet Take 1 tablet (90 mg total) by mouth every evening. 02/25/17   Laqueta Linden, MD  donepezil (ARICEPT) 10 MG tablet Take 1 tablet by mouth at bedtime. 08/06/14   [provider]  feeding supplement, GLUCERNA SHAKE, (GLUCERNA SHAKE) LIQD Take 237 mLs by mouth 3 (three) times daily between meals.    [provider]  furosemide (LASIX) 20 MG tablet Take 1 tablet (20 mg total) by mouth daily. 08/31/16   Laqueta Linden, MD  HYDROcodone-acetaminophen (NORCO/VICODIN) 5-325 MG tablet Take 1 tablet by mouth every 8 (eight) hours as needed for pain. 12/01/16   [provider]  mirtazapine (REMERON) 15 MG tablet Take 1 tablet by mouth at bedtime. 05/31/15   [provider]  Multiple Vitamin (MULTIVITAMIN) tablet Take 1 tablet by mouth daily.     [provider]  [facility] 28-10 MG CP24 take one capsule by mouth once daily 06/16/16   [provider]  omeprazole (PRILOSEC) 20 MG capsule Take 20 mg by mouth daily.    [provider]  potassium chloride (K-DUR) 10 MEQ tablet Take 1 tablet (10 mEq total) by mouth  daily. 06/18/15   Laqueta Linden, MD  warfarin (COUMADIN) 2 MG tablet Take 2 mg by mouth daily at 6 PM.    [provider]    Review of Systems:  Unobtainable secondary to patient's mental status  Physical Exam: Vitals:   07/28/2017 0953 08/02/2017 1000 07/29/2017 1015 07/21/2017 1030  BP:  96/60  (!) 100/57  Pulse:  (!) 103 74   Resp:  13 12 (!) 9  Temp: 99.9 F (37.7 C)     TempSrc: Rectal     SpO2:  98% 97%   Weight:      Height:       General:  A&O x 2, NAD, nontoxic, pleasant/cooperative Head/Eye: No conjunctival hemorrhage, no icterus, [place]/AT, No nystagmus ENT:  No icterus,  No thrush, good dentition, no pharyngeal exudate Neck:  No masses, no lymphadenpathy, no bruits CV:  IRRR, no rub, no gallop, no S3 Lung:   Bilateral scattered rhonchi.  No wheezing. Abdomen: soft/NT, +BS, nondistended, no peritoneal signs Ext: No cyanosis, No rashes, No petechiae, No lymphangitis, trace LE edema  Labs on Admission:  Basic Metabolic Panel: Recent Labs  Lab 07/25/2017 0927  NA 142  K 4.9  CL 102  CO2 26  GLUCOSE 135*  BUN 38*  CREATININE 1.62*  CALCIUM 9.5   Liver Function Tests: Recent Labs  Lab 08/08/2017 0927  AST 51*  ALT 27  ALKPHOS 90  BILITOT 1.4*  PROT 6.6  ALBUMIN 3.4*   No results for input(s): LIPASE, AMYLASE in the last 168 hours. No results for input(s): AMMONIA in the last 168 hours. CBC: Recent Labs  Lab 07/15/2017 0927  WBC 16.3*  NEUTROABS 14.7*  HGB 12.3  HCT 40.3  MCV 93.9  PLT 199   Coagulation Profile: Recent Labs  Lab 08/01/2017 0927  INR 2.23   Cardiac Enzymes: Recent Labs  Lab 08/07/2017 0927  TROPONINI <0.03   BNP: Invalid input(s): POCBNP CBG: No results for input(s): GLUCAP in the last 168 hours. Urine analysis:    Component Value Date/Time   COLORURINE YELLOW 07/10/2017 0926   APPEARANCEUR HAZY (A) 07/19/2017 0926   LABSPEC 1.016 07/12/2017 0926   PHURINE 5.0 08/03/2017 0926   GLUCOSEU NEGATIVE 07/18/2017 0926     HGBUR NEGATIVE 07/30/2017 0926   BILIRUBINUR NEGATIVE 07/30/2017 0926   KETONESUR NEGATIVE 07/14/2017 0926   PROTEINUR NEGATIVE 07/12/2017 0926   UROBILINOGEN 0.2 01/17/2015 1030   NITRITE NEGATIVE 07/24/2017 0926   LEUKOCYTESUR MODERATE (A) 08/07/2017 0926   Sepsis Labs: @LABRCNTIP (procalcitonin:4,lacticidven:4) ) Recent Results (from the past 240 hour(s))  Blood Culture (routine x 2)     Status: None (Preliminary result)   Collection Time: 08/05/2017  9:34 AM  Result Value Ref Range Status   Specimen Description   Final    BLOOD RIGHT ANTECUBITAL Blood Culture adequate volume   Special Requests BOTTLES DRAWN AEROBIC AND ANAEROBIC  Final   Culture PENDING  Incomplete   Report Status PENDING  Incomplete  Blood Culture (routine x 2)     Status: None (Preliminary result)   Collection Time: 08/04/2017  9:45 AM  Result Value Ref Range Status   Specimen Description   Final    BLOOD BLOOD LEFT ARM IV BOTTLES DRAWN AEROBIC AND ANAEROBIC   Special Requests Blood Culture adequate volume  Final   Culture PENDING  Incomplete   Report Status PENDING  Incomplete     Radiological Exams on Admission: Ct Head Wo Contrast  Result Date: 07/26/2017 CLINICAL DATA:  Increasing weakness. EXAM: CT HEAD WITHOUT CONTRAST TECHNIQUE: Contiguous axial images were obtained from the base of the skull through the vertex without intravenous contrast. COMPARISON:  07/19/2016 FINDINGS: Brain: There is atrophy and chronic small vessel disease changes. No acute intracranial abnormality. Specifically, no hemorrhage, hydrocephalus, mass lesion, acute infarction, or significant intracranial injury. Vascular: No hyperdense vessel or unexpected calcification. Skull: No acute calvarial abnormality. Sinuses/Orbits: Visualized paranasal sinuses and mastoids clear. Orbital soft tissues unremarkable. Other: None IMPRESSION: No acute intracranial abnormality. Atrophy, chronic microvascular disease. Electronically Signed   By:  Charlett NoseKevin  Dover M.D.   On: 08/09/2017 11:18   Dg Chest Port 1 View  Result Date: 07/26/2017 CLINICAL DATA:  Cough.  Right sided row walls. EXAM: PORTABLE CHEST 1 VIEW COMPARISON:  07/16/2017 FINDINGS: There are new bilateral airspace lung opacities. This is most confluent in the right lower lung zone, with more ground-glass type opacity seen more diffusely in the central lungs. Probable small effusions.  No pneumothorax. Cardiac silhouette is borderline enlarged. No mediastinal or hilar masses. Left anterior chest wall single lead pacemaker is stable.  Skeletal structures are demineralized. IMPRESSION: 1. New bilateral airspace lung opacities most confluent in the right lower lobe. Findings are consistent with multifocal pneumonia or asymmetric edema. Electronically Signed   By: Amie Portland M.D.   On: 07/10/2017 09:51    EKG: Independently reviewed. AFib, nonspecific T wave changes    Time spent:60 minutes Code Status:   FULL Family Communication:  Spouse and son updated at bedside Disposition Plan: expect 2-3 day hospitalization Consults called: none DVT Prophylaxis: Coumadin  Catarina Hartshorn, DO  [facility] Pager [phone]  If 7PM-7AM, please contact night-coverage [email or web address] Password [facility] 07/13/2017, 11:42 AM

## 2017-07-31 NOTE — ED Provider Notes (Signed)
[facility] EMERGENCY DEPARTMENT Provider Note   CSN: [id number] Arrival date & time: December 05, 2016  0908     History   Chief Complaint Chief Complaint  Patient presents with  . Weakness    HPI [name] is a 83 y.o. female.  HPI  The patient is a pleasant 83 year old female who has a history of dementia and is accompanied by her husband and the CNA that helps take care of her at home.  The patient does have a known history of chronic atrial fibrillation, chronic congestive heart failure which appears to be diastolic per the notes and chronic kidney disease stage III.  She is known to have coronary disease, dementia, she is on chronic Coumadin therapy but has also had some renal artery stenosis.  The patient has been in her usual state of health which unfortunately is fairly poor with the inability to ambulate, she requires significant assistance getting in and out of bed into her wheelchair and does not ambulate at baseline.  She has also had a chronically poor appetite and seems to be generally struggling with failure to thrive.  That being said she was seen at her doctor's office, Dr. Sherril CroonVyas, yesterday where she was prescribed an antibiotic for a recent dry cough that is started.  This antibiotic has not yet been filled by the husband as it was a late visit yesterday afternoon.  When they went to see her this morning she was diaphoretic, she felt hot to the touch, she was generally weak and did not seem to be her normal self.  She was unable to even get on a bedpan without significant assistance from the CNA which is extremely unusual.  Additionally they found that there was some vomit on the bed sheets which was unusual.  The patient is unable to give much history because of dementia thus a level 5 caveat applies  Past Medical History:  Diagnosis Date  . Chronic atrial fibrillation (HCC)   . Chronic diastolic CHF (congestive heart failure) (HCC)   . CKD (chronic kidney disease),  stage III (HCC)   . Coronary artery disease   . Dementia   . Encounter for long-term (current) use of other medications    COUMADIN THERAPY  . Essential hypertension   . GERD (gastroesophageal reflux disease)   . Lower extremity weakness   . Pericardial effusion    a. Echo 08/2014: mod pericardial effusion.  . Renal artery stenosis (HCC)    a. right renal artery stenosis (1-59% by duplex in 04/2013).  . Sinoatrial node dysfunction (HCC)     SINUS BRADYCARDIA  . Sinus bradycardia     Patient Active Problem List   Diagnosis Date Noted  . Essential hypertension 07/02/2015  . Chronic atrial fibrillation (HCC) 07/02/2015  . CKD (chronic kidney disease), stage III (HCC) 07/02/2015  . Renal artery stenosis (HCC) 07/02/2015  . Elevated transaminase level 01/18/2015  . FTT (failure to thrive) in adult 01/17/2015  . Hyperkalemia   . Protein-calorie malnutrition, severe (HCC) 09/04/2014  . Dementia 09/03/2014  . GERD without esophagitis 09/03/2014  . Anxiety 09/03/2014  . Loss of weight 09/03/2014  . Physical deconditioning 09/03/2014  . C. difficile diarrhea 05/11/2014  . C. difficile colitis 04/13/2014  . Chronic respiratory failure with hypoxia (HCC) 04/13/2014  . Chronic diarrhea 03/05/2014  . Encounter for therapeutic drug monitoring 09/15/2013  . Gait difficulty 05/18/2013  . Chronic diastolic heart failure (HCC) 02/17/2013  . Chronic renal insufficiency   . Peripheral edema  02/03/2013  . Dysphagia 01/12/2011  . Dilated cardiomyopathy (HCC) 11/04/2010  . Mitral regurgitation 11/04/2010  . PFO (patent foramen ovale) 11/04/2010  . Chronic anticoagulation 11/04/2010  . Long term (current) use of anticoagulants 10/30/2010  . COMPRESSION FRACTURE, SPINE 10/10/2010  . ATHEROSCLEROSIS OF RENAL ARTERY 09/04/2010  . HTN (hypertension) 05/27/2009  . SINUS BRADYCARDIA 05/27/2009    Past Surgical History:  Procedure Laterality Date  . BACK SURGERY    . CARDIOVERSION     x2  .  CHOLECYSTECTOMY    . COLONOSCOPY  05/2011   Dr. Allena Katz: normal. no bx.  Marland Kitchen KNEE ARTHROSCOPY    . PACEMAKER IMPLANT  04/30/2016   SJM Endurity VR PPM implanted by Dr Danella Maiers in [place]    OB History    No data available       Home Medications    Prior to Admission medications   Medication Sig Start Date End Date Taking? Authorizing Provider  ALPRAZolam Prudy Feeler) 0.5 MG tablet Take 0.5 mg by mouth at bedtime as needed for anxiety or sleep.    [provider]  Biotin 5000 MCG TABS Take by mouth daily. 10,000 mcg daily    [provider]  Calcium Carbonate-Vitamin D (CALTRATE 600+D PO) Take 1 tablet by mouth 2 (two) times daily.     [provider]  carvedilol (COREG) 12.5 MG tablet Take 6.25-12.5 mg by mouth 2 (two) times daily with a meal. Take 12.5 mg in the morning and 6.25 mg in the evening.    [provider]  Cyanocobalamin (VITAMIN B-12 PO) Take 1 tablet by mouth daily.    [provider]  diltiazem (CARDIZEM) 120 MG tablet Take 1 tablet (120 mg total) by mouth every morning. 02/25/17   Laqueta Linden, MD  diltiazem (CARDIZEM) 90 MG tablet Take 1 tablet (90 mg total) by mouth every evening. 02/25/17   Laqueta Linden, MD  donepezil (ARICEPT) 10 MG tablet Take 1 tablet by mouth at bedtime. 08/06/14   [provider]  feeding supplement, GLUCERNA SHAKE, (GLUCERNA SHAKE) LIQD Take 237 mLs by mouth 3 (three) times daily between meals.    [provider]  furosemide (LASIX) 20 MG tablet Take 1 tablet (20 mg total) by mouth daily. 08/31/16   Laqueta Linden, MD  HYDROcodone-acetaminophen (NORCO/VICODIN) 5-325 MG tablet Take 1 tablet by mouth every 8 (eight) hours as needed for pain. 12/01/16   [provider]  mirtazapine (REMERON) 15 MG tablet Take 1 tablet by mouth at bedtime. 05/31/15   [provider]  Multiple Vitamin (MULTIVITAMIN) tablet Take 1 tablet by mouth daily.     [provider]  [facility] 28-10 MG CP24 take one capsule by mouth once daily 06/16/16   [provider]  omeprazole (PRILOSEC) 20 MG capsule Take 20 mg by mouth daily.    [provider]  potassium chloride (K-DUR) 10 MEQ tablet Take 1 tablet (10 mEq total) by mouth daily. 06/18/15   Laqueta Linden, MD  warfarin (COUMADIN) 2 MG tablet Take 2 mg by mouth daily at 6 PM.    [provider]    Family History Family History  Problem Relation Age of Onset  . Heart failure Mother   . High blood pressure Mother   . Cancer Brother   . Cirrhosis Other     Social History Social History   Tobacco Use  . Smoking status: Never Smoker  . Smokeless tobacco: Never Used  . Tobacco  comment: Never smoked  Substance Use Topics  . Alcohol use: No    Alcohol/week: 0.0 oz  . Drug use: No     Allergies   Patient has no known allergies.   Review of Systems Review of Systems  Unable to perform ROS: Dementia     Physical Exam Updated Vital Signs BP 101/67 (BP Location: Right Arm)   Pulse (!) 108   Temp 99.9 F (37.7 C) (Rectal)   Resp 11   Ht 5\' 6"  (1.676 m)   Wt 52.2 kg (115 lb)   SpO2 95%   BMI 18.56 kg/m   Physical Exam  Constitutional: She appears well-developed and well-nourished. No distress.  The patient is lethargic, somnolent but arousable  HENT:  Head: Normocephalic and atraumatic.  Mouth/Throat: No oropharyngeal exudate.  Dry mucous membranes  Eyes: Conjunctivae and EOM are normal. Right eye exhibits no discharge. Left eye exhibits no discharge. No scleral icterus.  1 mm pupils bilaterally  Neck: Normal range of motion. Neck supple. No JVD present. No thyromegaly present.  No lymphadenopathy of the neck or the supraclavicular region  Cardiovascular: Normal heart sounds. Exam reveals no gallop and no friction rub.  No murmur heard. Bilateral weak radial artery pulses, no significant edema, no JVD, no obvious murmurs, she does have an  irregularly irregular rhythm with heart rates between 95 and 115 in a pattern consistent with atrial fibrillation.  Pulmonary/Chest: Effort normal. No respiratory distress. She has no wheezes. She has rales ( There are some rhonchi and there are rales on the right anterior chest, posterior chest without abnormal sounds, no increased work of breathing).  Abdominal: Soft. Bowel sounds are normal. She exhibits no distension and no mass. There is no tenderness.  Musculoskeletal: Normal range of motion. She exhibits no edema or tenderness.  Lymphadenopathy:    She has no cervical adenopathy.  Neurological:  The patient's exam is limited by a prior left proximal humerus fracture from 6 weeks ago which seems to be healing however she does have some left upper extremity difficulty moving.  All other extremities appear to be at baseline though she is generally weak, her mental status is somnolent but arousable, she does not speak to answer questions but is able to follow commands  Skin: Skin is warm and dry. No rash noted. No erythema.  Psychiatric: She has a normal mood and affect. Her behavior is normal.  Nursing note and vitals reviewed.    ED Treatments / Results  Labs (all labs ordered are listed, but only abnormal results are displayed) Labs Reviewed  CBC WITH DIFFERENTIAL/PLATELET - Abnormal; Notable for the following components:      Result Value   WBC 16.3 (*)    RDW 16.2 (*)    Neutro Abs 14.7 (*)    All other components within normal limits  COMPREHENSIVE METABOLIC PANEL - Abnormal; Notable for the following components:   Glucose, Bld 135 (*)    BUN 38 (*)    Creatinine, Ser 1.62 (*)    Albumin 3.4 (*)    AST 51 (*)    Total Bilirubin 1.4 (*)    GFR calc non Af Amer 28 (*)    GFR calc Af Amer 32 (*)    All other components within normal limits  URINALYSIS, ROUTINE W REFLEX MICROSCOPIC - Abnormal; Notable for the following components:   APPearance HAZY (*)    Leukocytes, UA  MODERATE (*)    Bacteria, UA MANY (*)    Squamous  Epithelial / LPF 0-5 (*)    All other components within normal limits  PROTIME-INR - Abnormal; Notable for the following components:   Prothrombin Time 24.5 (*)    All other components within normal limits  URINE CULTURE  CULTURE, BLOOD (ROUTINE X 2)  CULTURE, BLOOD (ROUTINE X 2)  TROPONIN I  I-STAT CG4 LACTIC ACID, ED    EKG  EKG Interpretation  Date/Time:  Saturday Aug 07, 2017 09:21:57 EST Ventricular Rate:  111 PR Interval:    QRS Duration: 99 QT Interval:  345 QTC Calculation: 469 R Axis:   -26 Text Interpretation:  Atrial fibrillation Ventricular premature complex Borderline left axis deviation Low voltage, extremity and precordial leads Nonspecific repol abnormality, diffuse leads Baseline wander in lead(s) V4 Since last tracing voltage is lower Confirmed by Eber Hong ([id number]) on 07-Aug-2017 10:32:06 AM       Radiology Dg Chest Port 1 View  Result Date: 08/07/2017 CLINICAL DATA:  Cough.  Right sided row walls. EXAM: PORTABLE CHEST 1 VIEW COMPARISON:  07/16/2017 FINDINGS: There are new bilateral airspace lung opacities. This is most confluent in the right lower lung zone, with more ground-glass type opacity seen more diffusely in the central lungs. Probable small effusions.  No pneumothorax. Cardiac silhouette is borderline enlarged. No mediastinal or hilar masses. Left anterior chest wall single lead pacemaker is stable. Skeletal structures are demineralized. IMPRESSION: 1. New bilateral airspace lung opacities most confluent in the right lower lobe. Findings are consistent with multifocal pneumonia or asymmetric edema. Electronically Signed   By: Amie Portland M.D.   On: 08-07-17 09:51    Procedures .Critical Care Performed by: Eber Hong, MD Authorized by: Eber Hong, MD   Critical care provider statement:    Critical care time (minutes):  35   Critical care time was exclusive of:  Separately billable  procedures and treating other patients and teaching time   Critical care was necessary to treat or prevent imminent or life-threatening deterioration of the following conditions:  Renal failure and sepsis   Critical care was time spent personally by me on the following activities:  Blood draw for specimens, development of treatment plan with patient or surrogate, discussions with consultants, evaluation of patient's response to treatment, examination of patient, obtaining history from patient or surrogate, ordering and performing treatments and interventions, ordering and review of laboratory studies, ordering and review of radiographic studies, pulse oximetry, re-evaluation of patient's condition and review of old charts   (including critical care time)  Medications Ordered in ED Medications  0.9 %  sodium chloride infusion (1,000 mLs Intravenous New Bag/Given Aug 07, 2017 1002)  cefTRIAXone (ROCEPHIN) 1 g in dextrose 5 % 50 mL IVPB (1 g Intravenous New Bag/Given Aug 07, 2017 1009)  azithromycin (ZITHROMAX) 500 mg in dextrose 5 % 250 mL IVPB (not administered)  azithromycin (ZITHROMAX) 500 mg in dextrose 5 % 250 mL IVPB (not administered)  cefTRIAXone (ROCEPHIN) 1 g in dextrose 5 % 50 mL IVPB (not administered)  cefTRIAXone (ROCEPHIN) 1 g in dextrose 5 % 50 mL IVPB (not administered)  ondansetron (ZOFRAN) injection 4 mg (4 mg Intravenous Given 08-07-17 0959)     Initial Impression / Assessment and Plan / ED Course  I have reviewed the triage vital signs and the nursing notes.  Pertinent labs & imaging results that were available during my care of the patient were reviewed by me and considered in my medical decision making (see chart for details).    I am generally concerned that the patient  has had a significant failure to thrive which could include a pneumonia, urinary tract infection and with her history of being on Coumadin with vomiting and somnolence I would also consider aspiration,  intracranial hemorrhage, or electrolyte abnormalities or progressive renal failure given her history of chronic kidney disease and poor oral intake.  She does appear ill and I suspect she will need to be admitted to the hospital for hydration and supportive care at the minimum  X-ray reviewed and report reviewed showing multifocal lower lobe pneumonia, she also has a significant urinary tract infection, she has endorgan dysfunction with progressive renal failure and a leukocytosis of 16,000 with a decrease in her mental status and a borderline blood pressure.  I am concerned that the patient may be septic.  Code sepsis will be activated and the patient will be given sepsis dose antibiotics to cover for both pneumonia and urinary tract infection.  I have paged the hospitalist at 10:30 AM to discuss admission.  Thankfully the lactic acid is 1.54, 30 cc/kg has not been ordered though the patient is on a saline drip for some fluid hydration.  Discussed with hospitalist at 10:45 AM, they will admit.  Final Clinical Impressions(s) / ED Diagnoses   Final diagnoses:  Sepsis, due to unspecified organism [facility])  Urinary tract infection without hematuria, site unspecified  Community acquired pneumonia, unspecified laterality  AKI (acute kidney injury) (HCC)  Dehydration  Acute encephalopathy    ED Discharge Orders    None       Eber Hong, MD 2017/08/11 1048

## 2017-07-31 NOTE — Progress Notes (Signed)
Pharmacy Antibiotic Note  [name] is a 83 y.o. female admitted on 08/09/17 with pneumonia.  Pharmacy has been consulted for ceftriaxone and azithromycin dosing. Initial doses ordered in the ED  Plan: Continue azithromycin 500 mg IV and rocephin 1gm IV q24 hours Pharmacy will sign off as no dose adjustment needed  Height: 5\' 6"  (167.6 cm) Weight: 115 lb (52.2 kg) IBW/kg (Calculated) : 59.3   Estimated Creatinine Clearance: 31.4 mL/min (A) (by C-G formula based on SCr of 1.06 mg/dL (H)).    No Known Allergies   Thank you for allowing pharmacy to be a part of this patient's care.  Talbert Cage Poteet 2017-08-09 9:45 AM

## 2017-07-31 NOTE — ED Notes (Signed)
Awaiting bed ready 

## 2017-07-31 NOTE — ED Notes (Signed)
Awaiting ICU bed ready

## 2017-07-31 NOTE — ED Notes (Signed)
ED Provider at bedside. 

## 2017-07-31 NOTE — Progress Notes (Addendum)
ANTICOAGULATION CONSULT NOTE - Initial Consult  Pharmacy Consult for coumadin Indication: atrial fibrillation  No Known Allergies  Patient Measurements: Height: 5\' 6"  (167.6 cm) Weight: 115 lb (52.2 kg) IBW/kg (Calculated) : 59.3   Vital Signs: Temp: 99.9 F (37.7 C) (12/22 0953) Temp Source: Rectal (12/22 0953) BP: 95/58 (12/22 1230) Pulse Rate: 81 (12/22 1230)  Labs: Recent Labs    2017-08-02 0927  HGB 12.3  HCT 40.3  PLT 199  LABPROT 24.5*  INR 2.23  CREATININE 1.62*  TROPONINI <0.03    Estimated Creatinine Clearance: 20.5 mL/min (A) (by C-G formula based on SCr of 1.62 mg/dL (H)).   Medical History: Past Medical History:  Diagnosis Date  . Chronic atrial fibrillation (HCC)   . Chronic diastolic CHF (congestive heart failure) (HCC)   . CKD (chronic kidney disease), stage III (HCC)   . Coronary artery disease   . Dementia   . Encounter for long-term (current) use of other medications    COUMADIN THERAPY  . Essential hypertension   . GERD (gastroesophageal reflux disease)   . Lower extremity weakness   . Pericardial effusion    a. Echo 08/2014: mod pericardial effusion.  . Renal artery stenosis (HCC)    a. right renal artery stenosis (1-59% by duplex in 04/2013).  . Sinoatrial node dysfunction (HCC)     SINUS BRADYCARDIA  . Sinus bradycardia     Medications:  See medication history Was on coumadin 2mg  po daily, last dose 12/21  Assessment: 83 yo lady to continue coumadin for afib. Admission INR is therapeutic. Goal of Therapy:  INR 2-3 Monitor platelets by anticoagulation protocol: Yes   Plan:  Coumadin 2 mg po today Daily PT/INR Monitor for bleeding complications   Alante Weimann Poteet 2017-08-02,12:44 PM

## 2017-07-31 NOTE — ED Notes (Signed)
hospitalist in to assess  

## 2017-07-31 NOTE — Progress Notes (Signed)
While taking night meds. Patient became choked on Glucerna drink. Pt immediately vomited Glucerna drink up. Constant coughing afterwards. RN concerned about aspiration. Patient sitting upright. Audible wheezing. Pt does not seem in distress, just coughing. VS normal. O2 sats maintained on 3L [place]   Will continue to monitor.  Genelle Bal, RN

## 2017-07-31 NOTE — ED Notes (Signed)
To radiology

## 2017-07-31 NOTE — ED Notes (Signed)
Report to Elizabeth,RN

## 2017-07-31 NOTE — ED Notes (Signed)
hospitalist at bedside

## 2017-08-01 LAB — CBC
HCT: 38.8 % (ref 36.0–46.0)
HEMOGLOBIN: 11.3 g/dL — AB (ref 12.0–15.0)
MCH: 28.1 pg (ref 26.0–34.0)
MCHC: 29.1 g/dL — AB (ref 30.0–36.0)
MCV: 96.5 fL (ref 78.0–100.0)
PLATELETS: 154 10*3/uL (ref 150–400)
RBC: 4.02 MIL/uL (ref 3.87–5.11)
RDW: 16.4 % — ABNORMAL HIGH (ref 11.5–15.5)
WBC: 10.8 10*3/uL — ABNORMAL HIGH (ref 4.0–10.5)

## 2017-08-01 LAB — BASIC METABOLIC PANEL
ANION GAP: 11 (ref 5–15)
BUN: 38 mg/dL — ABNORMAL HIGH (ref 6–20)
CALCIUM: 8.7 mg/dL — AB (ref 8.9–10.3)
CO2: 26 mmol/L (ref 22–32)
CREATININE: 1.36 mg/dL — AB (ref 0.44–1.00)
Chloride: 106 mmol/L (ref 101–111)
GFR, EST AFRICAN AMERICAN: 40 mL/min — AB (ref >60)
GFR, EST NON AFRICAN AMERICAN: 34 mL/min — AB (ref >60)
GLUCOSE: 115 mg/dL — AB (ref 65–99)
Potassium: 4.3 mmol/L (ref 3.5–5.1)
Sodium: 143 mmol/L (ref 135–145)

## 2017-08-01 LAB — BLOOD GAS, ARTERIAL
ACID-BASE DEFICIT: 1.3 mmol/L (ref 0.0–2.0)
BICARBONATE: 23.3 mmol/L (ref 20.0–28.0)
DRAWN BY: 221791
O2 Content: 5 L/min
O2 Saturation: 97.6 %
PCO2 ART: 39.5 mmHg (ref 32.0–48.0)
PH ART: 7.384 (ref 7.350–7.450)
PO2 ART: 88.6 mmHg (ref 83.0–108.0)

## 2017-08-01 LAB — PROTIME-INR
INR: 2.88
PROTHROMBIN TIME: 29.9 s — AB (ref 11.4–15.2)

## 2017-08-01 LAB — MAGNESIUM: Magnesium: 1.9 mg/dL (ref 1.7–2.4)

## 2017-08-01 LAB — AMMONIA: AMMONIA: 36 umol/L — AB (ref 9–35)

## 2017-08-01 MED ORDER — LEVALBUTEROL HCL 1.25 MG/0.5ML IN NEBU
1.2500 mg | INHALATION_SOLUTION | Freq: Four times a day (QID) | RESPIRATORY_TRACT | Status: DC
  Administered 2017-08-01 – 2017-08-04 (×14): 1.25 mg via RESPIRATORY_TRACT
  Filled 2017-08-01 (×14): qty 0.5

## 2017-08-01 MED ORDER — LEVALBUTEROL HCL 1.25 MG/0.5ML IN NEBU
1.2500 mg | INHALATION_SOLUTION | Freq: Four times a day (QID) | RESPIRATORY_TRACT | Status: DC
  Administered 2017-08-01: 1.25 mg via RESPIRATORY_TRACT
  Filled 2017-08-01: qty 0.5

## 2017-08-01 MED ORDER — WARFARIN 0.5 MG HALF TABLET
0.5000 mg | ORAL_TABLET | Freq: Once | ORAL | Status: DC
  Filled 2017-08-01: qty 1

## 2017-08-01 MED ORDER — DILTIAZEM HCL-DEXTROSE 100-5 MG/100ML-% IV SOLN (PREMIX)
2.5000 mg/h | INTRAVENOUS | Status: DC
  Administered 2017-08-01: 2.5 mg/h via INTRAVENOUS
  Administered 2017-08-02: 5 mg/h via INTRAVENOUS
  Administered 2017-08-02: 7.5 mg/h via INTRAVENOUS
  Administered 2017-08-04: 2.5 mg/h via INTRAVENOUS
  Administered 2017-08-05: 5 mg/h via INTRAVENOUS
  Filled 2017-08-01 (×6): qty 100

## 2017-08-01 MED ORDER — DILTIAZEM LOAD VIA INFUSION
10.0000 mg | Freq: Once | INTRAVENOUS | Status: AC
  Administered 2017-08-01: 10 mg via INTRAVENOUS
  Filled 2017-08-01: qty 10

## 2017-08-01 MED ORDER — IPRATROPIUM BROMIDE 0.02 % IN SOLN
0.5000 mg | Freq: Four times a day (QID) | RESPIRATORY_TRACT | Status: DC
  Administered 2017-08-01 – 2017-08-04 (×15): 0.5 mg via RESPIRATORY_TRACT
  Filled 2017-08-01 (×15): qty 2.5

## 2017-08-01 MED ORDER — METOPROLOL TARTRATE 5 MG/5ML IV SOLN: 2.5000 mg | Freq: Once | INTRAVENOUS | Status: DC

## 2017-08-01 MED ORDER — CHLORHEXIDINE GLUCONATE CLOTH 2 % EX PADS
6.0000 | MEDICATED_PAD | Freq: Every day | CUTANEOUS | Status: DC
  Administered 2017-08-01 – 2017-08-13 (×12): 6 via TOPICAL

## 2017-08-01 MED ORDER — MAGNESIUM SULFATE 2 GM/50ML IV SOLN
2.0000 g | Freq: Once | INTRAVENOUS | Status: AC
  Administered 2017-08-01: 2 g via INTRAVENOUS
  Filled 2017-08-01: qty 50

## 2017-08-01 MED ORDER — SODIUM CHLORIDE 0.9% FLUSH
10.0000 mL | Freq: Two times a day (BID) | INTRAVENOUS | Status: DC
Start: 2017-08-01 — End: 2017-08-13
  Administered 2017-08-01 – 2017-08-06 (×12): 10 mL
  Administered 2017-08-07: 30 mL
  Administered 2017-08-08 – 2017-08-13 (×11): 10 mL

## 2017-08-01 MED ORDER — SODIUM CHLORIDE 0.9% FLUSH: 10.0000 mL | INTRAVENOUS | Status: DC | PRN

## 2017-08-01 MED ORDER — METOPROLOL TARTRATE 5 MG/5ML IV SOLN
5.0000 mg | Freq: Four times a day (QID) | INTRAVENOUS | Status: DC
  Administered 2017-08-01 – 2017-08-05 (×17): 5 mg via INTRAVENOUS
  Filled 2017-08-01 (×17): qty 5

## 2017-08-01 NOTE — Plan of Care (Signed)
  Nutrition: Patient currently getting choked on thin liquids and per day shift Rn, food as well. Adequate nutrition will be maintained 08/01/2017 0347 - Progressing by Genelle Bal, RN

## 2017-08-01 NOTE — Progress Notes (Signed)
The nursing staff reports A. fib with RVR running from the low 100s-140s.  The patient has been off carvedilol and diltiazem due to hypotension.  She is currently hypertensive with a systolic in the 160s.  The nursing staff states that she has difficulty swallowing.  Metoprolol 5 mg IVP every 6 hours ordered.  Continue to monitor telemetry.  Sanda Klein, MD.

## 2017-08-01 NOTE — Progress Notes (Signed)
Patient's HR ranging from low 100's-140's. BP is now also increased to systolic 140's. Paged MD received order for Metoprolol 5mg  Q6. Med given.  PRN haldol also given. Pt is restless in bed, incontinent and playing her feces. Upon cleaning up patient. Nurse noticed that patient's wheezing had become worse. Patient's lips looked a little blue/purple, and an accurate O2 reading could not be obtained. Increased [place] to 5L, replaced O2 probe. Pt is mouth breathing so placed her on a non-rebreather temporarily. MD paged about audible wheezing and decrease in O2 sats.  Breathing treatments ordered. Patient O2 sats on non-rebreather are 100%  Pt still remains confused, but can answer questions.  Will continue to monitor

## 2017-08-01 NOTE — Plan of Care (Signed)
At the beginning of shift, pt was on a NRB due to not being able to maintain oxygen saturations during the night. Pt was able to transition over to 6L [place] and has maintained saturations in the 90's throughout the shift.  At 1300 RN noted that pt had not voided. Bladder scan performed and revealed 275 cc urine. MD notified. No new orders received. Later in the shift the caregiver for the pt called out and notified staff that pt stated she needed to void. Pt had already voided in the bed however, so urine was unable to be measured.  Apparently patient has been having some episodes of coughing and choking during oral intake. SLP was consulted but pt was too lethargic today to be evaluated. All PO intake was held today due to patient's inability to swallow safely. Pt is somewhat more alert this evening but still very lethargic.Will continue to monitor.

## 2017-08-01 NOTE — Progress Notes (Signed)
5 beat run of VTACH noted.

## 2017-08-01 NOTE — Progress Notes (Signed)
ANTICOAGULATION CONSULT NOTE   Pharmacy Consult for coumadin Indication: atrial fibrillation  No Known Allergies  Patient Measurements: Height: 5\' 6"  (167.6 cm) Weight: 124 lb 12.5 oz (56.6 kg) IBW/kg (Calculated) : 59.3   Vital Signs: Temp: 98.4 F (36.9 C) (12/23 0725) Temp Source: Axillary (12/23 0725) BP: 130/71 (12/23 0630) Pulse Rate: 106 (12/23 0725)  Labs: Recent Labs    07/30/2017 0927 07/16/2017 1615 08/01/17 0409  HGB 12.3  --  11.3*  HCT 40.3  --  38.8  PLT 199  --  154  APTT  --  49*  --   LABPROT 24.5*  --  29.9*  INR 2.23  --  2.88  CREATININE 1.62*  --  1.36*  TROPONINI <0.03  --   --     Estimated Creatinine Clearance: 26.5 mL/min (A) (by C-G formula based on SCr of 1.36 mg/dL (H)).   Medical History: Past Medical History:  Diagnosis Date  . Chronic atrial fibrillation (HCC)   . Chronic diastolic CHF (congestive heart failure) (HCC)   . CKD (chronic kidney disease), stage III (HCC)   . Coronary artery disease   . Dementia   . Encounter for long-term (current) use of other medications    COUMADIN THERAPY  . Essential hypertension   . GERD (gastroesophageal reflux disease)   . Lower extremity weakness   . Pericardial effusion    a. Echo 08/2014: mod pericardial effusion.  . Renal artery stenosis (HCC)    a. right renal artery stenosis (1-59% by duplex in 04/2013).  . Sinoatrial node dysfunction (HCC)     SINUS BRADYCARDIA  . Sinus bradycardia     Medications:  See medication history Was on coumadin 2mg  po daily, last dose 12/21  Assessment: 83 yo lady to continue coumadin for afib. Admission INR was therapeutic and INR has increased to 2.88 today.  No bleeding noted.  Will likely need lower doses than home dose while on antibiotics and poor appetite. Goal of Therapy:  INR 2-3 Monitor platelets by anticoagulation protocol: Yes   Plan:  Coumadin 0.5 mg po today Daily PT/INR Monitor for bleeding complications   Kieli Golladay  Poteet 08/01/2017,8:57 AM

## 2017-08-01 NOTE — Progress Notes (Signed)
SLP Cancellation Note  Patient Details Name: [name] MRN: [id number] DOB: [birth year]   Cancelled treatment:       Reason Eval/Treat Not Completed: Fatigue/lethargy limiting ability to participate; Pt lethargic and has been all day per family and RN. SLP spoke with Pt's step-daughter who provided some background information. She reports that she lives at home with family and has assistance during the day from outside help. She typically consumes meals up at the table, takes medications whole with water, and eats regular foods/liquids. She also reports a history of esophageal dysphagia and states that they were told a year ago that she had a stricture and did not wish to pursue treatment (?). SLP did search on Epic care everywhere tab and unable to find anything to substantiate this. She states she had work up at [employer]. SLP will attempt BSE tomorrow.   Thank you,  Havery Moros, CCC-SLP [phone]    PORTER,DABNEY 08/01/2017, 12:18 PM

## 2017-08-01 NOTE — Progress Notes (Signed)
PROGRESS NOTE    [name]  OZH:[id number] DOB: 05-17-1929 DOA: Aug 20, 2017 PCP: Ignatius Specking, MD    Brief Narrative:  83 year old female with a history of chronic respiratory failure, dementia, atrial fibrillation and diastolic heart failure, presents to the hospital with generalized weakness and cough.  Found to have possible aspiration pneumonia and admitted to the hospital for further treatments.  Patient developed rapid atrial fibrillation and was started on Cardizem infusion.  She is currently on intravenous antibiotics and IV fluids.   Assessment & Plan:   Active Problems:   Acute renal failure superimposed on stage 3 chronic kidney disease (HCC)   Sepsis (HCC)   Sepsis due to undetermined organism (HCC)   Chronic diastolic CHF (congestive heart failure) (HCC)   Acute on chronic respiratory failure with hypoxia (HCC)   Acute metabolic encephalopathy   Lobar pneumonia (HCC)   Permanent atrial fibrillation (HCC)   Dementia without behavioral disturbance   1. Sepsis.  Secondary to pneumonia.  Lactic acid 1.5 on admission, trended down with IV fluids.  Influenza panel negative.  Continue intravenous antibiotics. 2. Lobar pneumonia.  Underlying aspiration is a concern.  Speech therapy has been consulted.  Continue on Zosyn and azithromycin for now. 3. Dysphagia.  Staff has noted significant dysphagia with any p.o. intake.  Speech therapy evaluation has been requested. 4. Acute on chronic respiratory failure with hypoxia.  Related to pneumonia.  Chronically on 2 L.  Wean down oxygen as tolerated. 5. Acute on chronic renal failure, chronic kidney disease stage III.  Creatinine on presentation of 1.6.  Improving with IV fluids. 6. Possible urinary tract infection.  2 numerous to count WBCs on urinalysis.  Continue on IV antibiotics and follow-up urine culture. 7. Acute metabolic encephalopathy.  Suspect this is related to sepsis.  Continue to monitor.  She has baseline  dementia. 8. Atrial fibrillation with rapid ventricular response.  Restarted on intravenous metoprolol as well as intravenous Cardizem infusion to control heart rate.  Transition to p.o. once swallowing issues have been addressed.  She is anticoagulated with Coumadin. 9. Chronic diastolic CHF.  Lasix currently on hold due to elevated creatinine.  Continue to monitor.   DVT prophylaxis: Coumadin Code Status: Full code Family Communication: Discussed with son over the phone Disposition Plan: Return home on discharge, pending hospital course   Consultants:     Procedures:     Antimicrobials:   Zosyn 12/22 >  Azithromycin 12/23 >   Subjective: Patient does not respond to voice.  She does moan to sternal rub.  Reported by staff that she was agitated overnight and received sedative medications.  Objective: Vitals:   08/01/17 1630 08/01/17 1700 08/01/17 1730 08/01/17 1800  BP: (!) 126/107 119/75 110/63 131/80  Pulse: (!) 110 88 85 62  Resp: (!) 25 15 11  (!) 22  Temp:      TempSrc:      SpO2: 96% 95% 98% 95%  Weight:      Height:        Intake/Output Summary (Last 24 hours) at 08/01/2017 1843 Last data filed at 08/01/2017 1630 Gross per 24 hour  Intake 2317.08 ml  Output -  Net 2317.08 ml   Filed Weights   20-Aug-2017 0919 08/01/17 0530  Weight: 52.2 kg (115 lb) 56.6 kg (124 lb 12.5 oz)    Examination:  General exam: Appears calm and comfortable  Respiratory system: Clear to auscultation. Respiratory effort normal. Cardiovascular system: S1 & S2 heard, irregular. No JVD, murmurs,  rubs, gallops or clicks. No pedal edema. Gastrointestinal system: Abdomen is nondistended, soft and nontender. No organomegaly or masses felt. Normal bowel sounds heard. Central nervous system: Lethargic. No focal neurological deficits. Extremities: Symmetric 5 x 5 power. Skin: No rashes, lesions or ulcers Psychiatry: Lethargic    Data Reviewed: I have personally reviewed following  labs and imaging studies  CBC: Recent Labs  Lab 07/23/2017 0927 08/01/17 0409  WBC 16.3* 10.8*  NEUTROABS 14.7*  --   HGB 12.3 11.3*  HCT 40.3 38.8  MCV 93.9 96.5  PLT 199 154   Basic Metabolic Panel: Recent Labs  Lab 07/14/2017 0927 08/01/17 0409  NA 142 143  K 4.9 4.3  CL 102 106  CO2 26 26  GLUCOSE 135* 115*  BUN 38* 38*  CREATININE 1.62* 1.36*  CALCIUM 9.5 8.7*  MG  --  1.9   GFR: Estimated Creatinine Clearance: 26.5 mL/min (A) (by C-G formula based on SCr of 1.36 mg/dL (H)). Liver Function Tests: Recent Labs  Lab 07/18/2017 0927  AST 51*  ALT 27  ALKPHOS 90  BILITOT 1.4*  PROT 6.6  ALBUMIN 3.4*   No results for input(s): LIPASE, AMYLASE in the last 168 hours. Recent Labs  Lab 08/01/17 1348  AMMONIA 36*   Coagulation Profile: Recent Labs  Lab 07/12/2017 0927 08/01/17 0409  INR 2.23 2.88   Cardiac Enzymes: Recent Labs  Lab 08/05/2017 0927  TROPONINI <0.03   BNP (last 3 results) No results for input(s): PROBNP in the last 8760 hours. HbA1C: No results for input(s): HGBA1C in the last 72 hours. CBG: No results for input(s): GLUCAP in the last 168 hours. Lipid Profile: No results for input(s): CHOL, HDL, LDLCALC, TRIG, CHOLHDL, LDLDIRECT in the last 72 hours. Thyroid Function Tests: No results for input(s): TSH, T4TOTAL, FREET4, T3FREE, THYROIDAB in the last 72 hours. Anemia Panel: No results for input(s): VITAMINB12, FOLATE, FERRITIN, TIBC, IRON, RETICCTPCT in the last 72 hours. Sepsis Labs: Recent Labs  Lab 08/05/2017 0936 07/21/2017 1615  PROCALCITON  --  2.44  LATICACIDVEN 1.54 0.8    Recent Results (from the past 240 hour(s))  Urine Culture     Status: Abnormal (Preliminary result)   Collection Time: 07/25/2017  9:26 AM  Result Value Ref Range Status   Specimen Description URINE, CATHETERIZED  Final   Special Requests NONE  Final   Culture >=100,000 COLONIES/mL GRAM NEGATIVE RODS (A)  Final   Report Status PENDING  Incomplete  Blood  Culture (routine x 2)     Status: None (Preliminary result)   Collection Time: 07/26/2017  9:34 AM  Result Value Ref Range Status   Specimen Description   Final    BLOOD RIGHT ANTECUBITAL Blood Culture adequate volume   Special Requests BOTTLES DRAWN AEROBIC AND ANAEROBIC  Final   Culture NO GROWTH 1 DAY  Final   Report Status PENDING  Incomplete  Blood Culture (routine x 2)     Status: None (Preliminary result)   Collection Time: 07/12/2017  9:45 AM  Result Value Ref Range Status   Specimen Description   Final    BLOOD BLOOD LEFT ARM IV BOTTLES DRAWN AEROBIC AND ANAEROBIC   Special Requests Blood Culture adequate volume  Final   Culture NO GROWTH 1 DAY  Final   Report Status PENDING  Incomplete  MRSA PCR Screening     Status: None   Collection Time: 07/21/2017  2:03 PM  Result Value Ref Range Status   MRSA by PCR NEGATIVE NEGATIVE  Final    Comment:        The GeneXpert MRSA Assay (FDA approved for NASAL specimens only), is one component of a comprehensive MRSA colonization surveillance program. It is not intended to diagnose MRSA infection nor to guide or monitor treatment for MRSA infections.          Radiology Studies: Ct Head Wo Contrast  Result Date: 08/01/2017 CLINICAL DATA:  Increasing weakness. EXAM: CT HEAD WITHOUT CONTRAST TECHNIQUE: Contiguous axial images were obtained from the base of the skull through the vertex without intravenous contrast. COMPARISON:  07/19/2016 FINDINGS: Brain: There is atrophy and chronic small vessel disease changes. No acute intracranial abnormality. Specifically, no hemorrhage, hydrocephalus, mass lesion, acute infarction, or significant intracranial injury. Vascular: No hyperdense vessel or unexpected calcification. Skull: No acute calvarial abnormality. Sinuses/Orbits: Visualized paranasal sinuses and mastoids clear. Orbital soft tissues unremarkable. Other: None IMPRESSION: No acute intracranial abnormality. Atrophy, chronic microvascular  disease. Electronically Signed   By: Charlett NoseKevin  Dover M.D.   On: 07/27/2017 11:18   Dg Chest Port 1 View  Result Date: 07/21/2017 CLINICAL DATA:  PICC line placement. EXAM: PORTABLE CHEST 1 VIEW COMPARISON:  Film at 0930 hours FINDINGS: Interval placement of right upper extremity PICC line. The catheter tip extends into the right atrium. Although the tip is difficult to accurately delineate, it is felt to likely be within the inferior right atrium. Recommend retraction of the catheter approximately 3 cm. Bilateral pulmonary airspace disease without significant change. Long volumes are slightly improved bilaterally. IMPRESSION: PICC line tip extends into the right atrium with the tip difficult to visualize but likely within the inferior right atrium. Recommend retraction of the catheter by approximately 3 cm. Electronically Signed   By: Irish LackGlenn  Yamagata M.D.   On: 07/28/2017 15:58   Dg Chest Port 1 View  Result Date: 08/02/2017 CLINICAL DATA:  Cough.  Right sided row walls. EXAM: PORTABLE CHEST 1 VIEW COMPARISON:  07/16/2017 FINDINGS: There are new bilateral airspace lung opacities. This is most confluent in the right lower lung zone, with more ground-glass type opacity seen more diffusely in the central lungs. Probable small effusions.  No pneumothorax. Cardiac silhouette is borderline enlarged. No mediastinal or hilar masses. Left anterior chest wall single lead pacemaker is stable. Skeletal structures are demineralized. IMPRESSION: 1. New bilateral airspace lung opacities most confluent in the right lower lobe. Findings are consistent with multifocal pneumonia or asymmetric edema. Electronically Signed   By: Amie Portlandavid  Ormond M.D.   On: 08/06/2017 09:51        Scheduled Meds: . Chlorhexidine Gluconate Cloth  6 each Topical Daily  . donepezil  10 mg Oral QHS   And  . memantine  28 mg Oral Daily  . feeding supplement (ENSURE ENLIVE)  237 mL Oral BID BM  . feeding supplement (GLUCERNA SHAKE)  237 mL  Oral TID BM  . ipratropium  0.5 mg Nebulization Q6H  . levalbuterol  1.25 mg Nebulization Q6H  . mouth rinse  15 mL Mouth Rinse BID  . metoprolol tartrate  5 mg Intravenous Q6H  . multivitamin with minerals  1 tablet Oral Daily  . pantoprazole  40 mg Oral Daily  . sodium chloride flush  10-40 mL Intracatheter Q12H  . warfarin  0.5 mg Oral Once  . Warfarin - Pharmacist Dosing Inpatient   Does not apply q1800   Continuous Infusions: . sodium chloride 1,000 mL (07/30/2017 1002)  . sodium chloride 75 mL/hr at 08/01/17 1630  . azithromycin Stopped (08/01/17  1134)  . diltiazem (CARDIZEM) infusion 2.5 mg/hr (08/01/17 0440)  . piperacillin-tazobactam (ZOSYN)  IV Stopped (08/01/17 1756)     LOS: 1 day    Time spent:    Erick Blinks, MD [facility] Pager [phone]  If 7PM-7AM, please contact night-coverage [email or web address] Password [facility] 08/01/2017, 6:43 PM

## 2017-08-02 ENCOUNTER — Inpatient Hospital Stay (HOSPITAL_COMMUNITY): Payer: Medicare Other

## 2017-08-02 LAB — BRAIN NATRIURETIC PEPTIDE: B NATRIURETIC PEPTIDE 5: 541 pg/mL — AB (ref 0.0–100.0)

## 2017-08-02 LAB — PROTIME-INR
INR: 3.17
PROTHROMBIN TIME: 32.3 s — AB (ref 11.4–15.2)

## 2017-08-02 LAB — BASIC METABOLIC PANEL
ANION GAP: 13 (ref 5–15)
BUN: 37 mg/dL — ABNORMAL HIGH (ref 6–20)
CALCIUM: 8.7 mg/dL — AB (ref 8.9–10.3)
CO2: 23 mmol/L (ref 22–32)
Chloride: 107 mmol/L (ref 101–111)
Creatinine, Ser: 1.5 mg/dL — ABNORMAL HIGH (ref 0.44–1.00)
GFR calc non Af Amer: 30 mL/min — ABNORMAL LOW (ref >60)
GFR, EST AFRICAN AMERICAN: 35 mL/min — AB (ref >60)
Glucose, Bld: 127 mg/dL — ABNORMAL HIGH (ref 65–99)
Potassium: 4.2 mmol/L (ref 3.5–5.1)
Sodium: 143 mmol/L (ref 135–145)

## 2017-08-02 LAB — CBC
HCT: 38 % (ref 36.0–46.0)
HEMOGLOBIN: 11.2 g/dL — AB (ref 12.0–15.0)
MCH: 28.9 pg (ref 26.0–34.0)
MCHC: 29.5 g/dL — ABNORMAL LOW (ref 30.0–36.0)
MCV: 97.9 fL (ref 78.0–100.0)
Platelets: 161 10*3/uL (ref 150–400)
RBC: 3.88 MIL/uL (ref 3.87–5.11)
RDW: 16.6 % — ABNORMAL HIGH (ref 11.5–15.5)
WBC: 11.1 10*3/uL — ABNORMAL HIGH (ref 4.0–10.5)

## 2017-08-02 LAB — URINE CULTURE

## 2017-08-02 MED ORDER — WARFARIN - PHARMACIST DOSING INPATIENT
Status: DC
  Administered 2017-08-04 – 2017-08-12 (×5)

## 2017-08-02 MED ORDER — FUROSEMIDE 10 MG/ML IJ SOLN
40.0000 mg | Freq: Two times a day (BID) | INTRAMUSCULAR | Status: DC
  Administered 2017-08-02 – 2017-08-04 (×5): 40 mg via INTRAVENOUS
  Filled 2017-08-02 (×5): qty 4

## 2017-08-02 NOTE — Evaluation (Signed)
Clinical/Bedside Swallow Evaluation Patient Details  Name: [name] MRN: [id number] Date of Birth: 09/01/1928  Today's Date: 08/02/2017 Time: SLP Start Time (ACUTE ONLY): 1520 SLP Stop Time (ACUTE ONLY): 1558 SLP Time Calculation (min) (ACUTE ONLY): 38 min  Past Medical History:  Past Medical History:  Diagnosis Date  . Chronic atrial fibrillation (HCC)   . Chronic diastolic CHF (congestive heart failure) (HCC)   . CKD (chronic kidney disease), stage III (HCC)   . Coronary artery disease   . Dementia   . Encounter for long-term (current) use of other medications    COUMADIN THERAPY  . Essential hypertension   . GERD (gastroesophageal reflux disease)   . Lower extremity weakness   . Pericardial effusion    a. Echo 08/2014: mod pericardial effusion.  . Renal artery stenosis (HCC)    a. right renal artery stenosis (1-59% by duplex in 04/2013).  . Sinoatrial node dysfunction (HCC)     SINUS BRADYCARDIA  . Sinus bradycardia    Past Surgical History:  Past Surgical History:  Procedure Laterality Date  . BACK SURGERY    . CARDIOVERSION     x2  . CHOLECYSTECTOMY    . COLONOSCOPY  05/2011   Dr. Allena KatzPatel: normal. no bx.  Marland Kitchen. KNEE ARTHROSCOPY    . PACEMAKER IMPLANT  04/30/2016   SJM Endurity VR PPM implanted by Dr Danella MaiersJack Painter [place]   HPI:  83 year old female with a history of chronic respiratory failure, dementia, atrial fibrillation and diastolic heart failure, presents to the hospital with generalized weakness and cough.  Found to have possible aspiration pneumonia and admitted to the hospital for further treatments.  Patient developed rapid atrial fibrillation and was started on Cardizem infusion.  She is currently on intravenous antibiotics and IV fluids.   Assessment / Plan / Recommendation Clinical Impression  Pt with immediate signs and symptoms of aspiration with water and puree characterized by immediate and delayed coughing and periods of apnea with desats  into 80s. Hyolaryngeal excursion difficult to palpate consistently. Pt currently wearing NRBM on 10L O2. Pt is not yet ready for objective assessment due to poor respiratory support. Recommend continued NPO with oral care and ice chips presented by RN for comfort. No SLP coverage tomorrow so Pt will be seen on 08/04/2017. Above to family and RN.  SLP Visit Diagnosis: Dysphagia, oropharyngeal phase (R13.12)    Aspiration Risk  Moderate aspiration risk;Risk for inadequate nutrition/hydration;Severe aspiration risk    Diet Recommendation NPO;Ice chips PRN after oral care   Medication Administration: Crushed with puree    Other  Recommendations Oral Care Recommendations: Oral care prior to ice chip/H20;Oral care QID Other Recommendations: Have oral suction available   Follow up Recommendations 24 hour supervision/assistance      Frequency and Duration min 2x/week  1 week       Prognosis Prognosis for Safe Diet Advancement: Guarded Barriers to Reach Goals: Severity of deficits      Swallow Study   General Date of Onset: 07/30/2017 HPI: 83 year old female with a history of chronic respiratory failure, dementia, atrial fibrillation and diastolic heart failure, presents to the hospital with generalized weakness and cough.  Found to have possible aspiration pneumonia and admitted to the hospital for further treatments.  Patient developed rapid atrial fibrillation and was started on Cardizem infusion.  She is currently on intravenous antibiotics and IV fluids. Type of Study: Bedside Swallow Evaluation Previous Swallow Assessment: None on record Diet Prior to this Study: Dysphagia 3 (soft);Thin  liquids Temperature Spikes Noted: No Respiratory Status: Non-rebreather History of Recent Intubation: No Behavior/Cognition: Alert;Cooperative Oral Cavity Assessment: Within Functional Limits Oral Care Completed by SLP: Yes Oral Cavity - Dentition: Adequate natural dentition;Missing dentition Vision:  Impaired for self-feeding Self-Feeding Abilities: Needs assist;Needs set up Patient Positioning: Upright in bed Baseline Vocal Quality: Low vocal intensity Volitional Cough: Weak;Congested Volitional Swallow: Able to elicit    Oral/Motor/Sensory Function Overall Oral Motor/Sensory Function: Within functional limits   Ice Chips Ice chips: Impaired Presentation: Spoon Pharyngeal Phase Impairments: Suspected delayed Swallow   Thin Liquid Thin Liquid: Impaired Presentation: Cup;Self Fed;Straw Pharyngeal  Phase Impairments: Suspected delayed Swallow;Decreased hyoid-laryngeal movement;Cough - Immediate;Cough - Delayed;Change in Vital Signs("silent" cough for up to 6 seconds with de-sats)    Nectar Thick Nectar Thick Liquid: Not tested   Honey Thick Honey Thick Liquid: Not tested   Puree Puree: Impaired Presentation: Spoon Pharyngeal Phase Impairments: Suspected delayed Swallow;Decreased hyoid-laryngeal movement;Cough - Delayed   Solid   Thank you,  Havery Moros, CCC-SLP [phone]    Solid: Not tested        [name] 08/02/2017,4:11 PM

## 2017-08-02 NOTE — Progress Notes (Signed)
PROGRESS NOTE    [name]  ZOX:[id number] DOB: 05-01-1929 DOA: 07/11/2017 PCP: Ignatius Specking, MD    Brief Narrative:  83 year old female with a history of chronic respiratory failure, dementia, atrial fibrillation and diastolic heart failure, presents to the hospital with generalized weakness and cough.  Found to have possible aspiration pneumonia and admitted to the hospital for further treatments.  Patient developed rapid atrial fibrillation and was started on Cardizem infusion.  She is currently on intravenous antibiotics and IV fluids.   Assessment & Plan:   Active Problems:   Acute renal failure superimposed on stage 3 chronic kidney disease (HCC)   Sepsis (HCC)   Sepsis due to undetermined organism (HCC)   Chronic diastolic CHF (congestive heart failure) (HCC)   Acute on chronic respiratory failure with hypoxia (HCC)   Acute metabolic encephalopathy   Lobar pneumonia (HCC)   Permanent atrial fibrillation (HCC)   Dementia without behavioral disturbance   1. Sepsis.  Secondary to pneumonia.  Lactic acid 1.5 on admission, trended down with IV fluids.  Influenza panel negative.  Continue intravenous antibiotics. 2. Lobar pneumonia.  Underlying aspiration is a concern.  Speech therapy has been consulted.  Strep pneumo antigen negative.  Continue on Zosyn and azithromycin for now. 3. Dysphagia.  Staff has noted significant dysphagia with any p.o. intake.  Speech therapy evaluation has been requested. 4. Acute on chronic respiratory failure with hypoxia.  Related to pneumonia/pulmonary edema.  Chronically on 2 L.  Currently requiring partial rebreather mask.  Wean down oxygen as tolerated. 5. Acute on chronic renal failure, chronic kidney disease stage III.  Creatinine on presentation of 1.6.  This mildly improved with hydration, but has since trended back up to 1.5.  Continue to follow 6. Possible urinary tract infection.  Too numerous to count WBCs on urinalysis.  Continue on  IV antibiotics and follow-up urine culture. 7. Acute metabolic encephalopathy.  Suspect this is related to sepsis.  Appears to be better today.  Continue to monitor.  She has baseline dementia. 8. Atrial fibrillation with rapid ventricular response.  Currently on intravenous metoprolol as well as intravenous Cardizem infusion to control heart rate.  Transition to p.o. once swallowing issues have been addressed.  She is anticoagulated with Coumadin. 9. Acute on Chronic diastolic CHF.  Chest x-ray shows worsening pulmonary edema.  Patient has elevated BNP.  Likely precipitated by IV hydration for pneumonia.  Will start on IV Lasix..   DVT prophylaxis: Coumadin Code Status: Full code Family Communication: Discussed with son over the phone Disposition Plan: Return home on discharge, pending hospital course   Consultants:     Procedures:     Antimicrobials:   Zosyn 12/22 >  Azithromycin 12/23 >   Subjective: Feels that her breathing is worse today.  No chest pain.  Continues to have cough.  Objective: Vitals:   08/02/17 0600 08/02/17 0630 08/02/17 0726 08/02/17 0840  BP: (!) 144/72 (!) 145/85    Pulse: 93 91 92   Resp: 18 (!) 21 15   Temp:  97.6 F (36.4 C) (!) 97.5 F (36.4 C)   TempSrc:  Oral Axillary   SpO2: 92% 95% 97% 96%  Weight:      Height:        Intake/Output Summary (Last 24 hours) at 08/02/2017 0918 Last data filed at 08/01/2017 1630 Gross per 24 hour  Intake 881.25 ml  Output -  Net 881.25 ml   Filed Weights   07/21/2017 0919 08/01/17 0530  Weight: 52.2 kg (115 lb) 56.6 kg (124 lb 12.5 oz)    Examination:  General exam: Appears calm and comfortable  Respiratory system: Bilateral crackles with mild upper airway wheeze.  Increased respiratory effort. Cardiovascular system: S1 & S2 heard, irregular. No JVD, murmurs, rubs, gallops or clicks. No pedal edema. Gastrointestinal system: Abdomen is nondistended, soft and nontender. No organomegaly or masses  felt. Normal bowel sounds heard. Central nervous system: Awake. No focal neurological deficits. Extremities: Symmetric 5 x 5 power. Skin: No rashes, lesions or ulcers Psychiatry: Awake, pleasant, cooperative    Data Reviewed: I have personally reviewed following labs and imaging studies  CBC: Recent Labs  Lab 07/21/2017 0927 08/01/17 0409 08/02/17 0632  WBC 16.3* 10.8* 11.1*  NEUTROABS 14.7*  --   --   HGB 12.3 11.3* 11.2*  HCT 40.3 38.8 38.0  MCV 93.9 96.5 97.9  PLT 199 154 161   Basic Metabolic Panel: Recent Labs  Lab 08/09/2017 0927 08/01/17 0409 08/02/17 0632  NA 142 143 143  K 4.9 4.3 4.2  CL 102 106 107  CO2 26 26 23   GLUCOSE 135* 115* 127*  BUN 38* 38* 37*  CREATININE 1.62* 1.36* 1.50*  CALCIUM 9.5 8.7* 8.7*  MG  --  1.9  --    GFR: Estimated Creatinine Clearance: 24.1 mL/min (A) (by C-G formula based on SCr of 1.5 mg/dL (H)). Liver Function Tests: Recent Labs  Lab 07/18/2017 0927  AST 51*  ALT 27  ALKPHOS 90  BILITOT 1.4*  PROT 6.6  ALBUMIN 3.4*   No results for input(s): LIPASE, AMYLASE in the last 168 hours. Recent Labs  Lab 08/01/17 1348  AMMONIA 36*   Coagulation Profile: Recent Labs  Lab 07/21/2017 0927 08/01/17 0409 08/02/17 0632  INR 2.23 2.88 3.17   Cardiac Enzymes: Recent Labs  Lab 07/10/2017 0927  TROPONINI <0.03   BNP (last 3 results) No results for input(s): PROBNP in the last 8760 hours. HbA1C: No results for input(s): HGBA1C in the last 72 hours. CBG: No results for input(s): GLUCAP in the last 168 hours. Lipid Profile: No results for input(s): CHOL, HDL, LDLCALC, TRIG, CHOLHDL, LDLDIRECT in the last 72 hours. Thyroid Function Tests: No results for input(s): TSH, T4TOTAL, FREET4, T3FREE, THYROIDAB in the last 72 hours. Anemia Panel: No results for input(s): VITAMINB12, FOLATE, FERRITIN, TIBC, IRON, RETICCTPCT in the last 72 hours. Sepsis Labs: Recent Labs  Lab 07/22/2017 0936 07/19/2017 1615  PROCALCITON  --  2.44    LATICACIDVEN 1.54 0.8    Recent Results (from the past 240 hour(s))  Urine Culture     Status: Abnormal   Collection Time: 08/05/2017  9:26 AM  Result Value Ref Range Status   Specimen Description URINE, CATHETERIZED  Final   Special Requests NONE  Final   Culture >=100,000 COLONIES/mL SERRATIA MARCESCENS (A)  Final   Report Status 08/02/2017 FINAL  Final   Organism ID, Bacteria SERRATIA MARCESCENS (A)  Final      Susceptibility   Serratia marcescens - MIC*    CEFAZOLIN >=64 RESISTANT Resistant     CEFTRIAXONE <=1 SENSITIVE Sensitive     CIPROFLOXACIN <=0.25 SENSITIVE Sensitive     GENTAMICIN <=1 SENSITIVE Sensitive     NITROFURANTOIN 128 RESISTANT Resistant     TRIMETH/SULFA <=20 SENSITIVE Sensitive     * >=100,000 COLONIES/mL SERRATIA MARCESCENS  Blood Culture (routine x 2)     Status: None (Preliminary result)   Collection Time: 07/14/2017  9:34 AM  Result Value Ref  Range Status   Specimen Description   Final    BLOOD RIGHT ANTECUBITAL Blood Culture adequate volume   Special Requests BOTTLES DRAWN AEROBIC AND ANAEROBIC  Final   Culture NO GROWTH 2 DAYS  Final   Report Status PENDING  Incomplete  Blood Culture (routine x 2)     Status: None (Preliminary result)   Collection Time: 08-24-16  9:45 AM  Result Value Ref Range Status   Specimen Description   Final    BLOOD BLOOD LEFT ARM IV BOTTLES DRAWN AEROBIC AND ANAEROBIC   Special Requests Blood Culture adequate volume  Final   Culture NO GROWTH 2 DAYS  Final   Report Status PENDING  Incomplete  MRSA PCR Screening     Status: None   Collection Time: 08-24-16  2:03 PM  Result Value Ref Range Status   MRSA by PCR NEGATIVE NEGATIVE Final    Comment:        The GeneXpert MRSA Assay (FDA approved for NASAL specimens only), is one component of a comprehensive MRSA colonization surveillance program. It is not intended to diagnose MRSA infection nor to guide or monitor treatment for MRSA infections.          Radiology  Studies: Ct Head Wo Contrast  Result Date: 2016/09/03 CLINICAL DATA:  Increasing weakness. EXAM: CT HEAD WITHOUT CONTRAST TECHNIQUE: Contiguous axial images were obtained from the base of the skull through the vertex without intravenous contrast. COMPARISON:  07/19/2016 FINDINGS: Brain: There is atrophy and chronic small vessel disease changes. No acute intracranial abnormality. Specifically, no hemorrhage, hydrocephalus, mass lesion, acute infarction, or significant intracranial injury. Vascular: No hyperdense vessel or unexpected calcification. Skull: No acute calvarial abnormality. Sinuses/Orbits: Visualized paranasal sinuses and mastoids clear. Orbital soft tissues unremarkable. Other: None IMPRESSION: No acute intracranial abnormality. Atrophy, chronic microvascular disease. Electronically Signed   By: Charlett NoseKevin  Dover M.D.   On: 2016/09/03 11:18   Dg Chest Port 1 View  Result Date: 08/02/2017 CLINICAL DATA:  Hypoxia. EXAM: PORTABLE CHEST 1 VIEW COMPARISON:  2016/09/03 and 07/16/2017 FINDINGS: The bilateral pulmonary infiltrates have progressed particularly in the left perihilar region. Increased density at the right lung base suggests a subpulmonic effusion. Probable small left effusion. Heart size and pulmonary vascularity remain normal. PICC tip is in the right atrium and could be retracted 3 cm. Pacemaker in place. No acute bone abnormality. Old fracture of the proximal left humerus. Aortic atherosclerosis. IMPRESSION: Progression of bilateral pulmonary infiltrates with evidence consistent with small bilateral new pleural effusions. PICC tip is in the right atrium and could be retracted 3 cm. Electronically Signed   By: Francene BoyersJames  Maxwell M.D.   On: 08/02/2017 09:06   Dg Chest Port 1 View  Result Date: 2016/09/03 CLINICAL DATA:  PICC line placement. EXAM: PORTABLE CHEST 1 VIEW COMPARISON:  Film at 0930 hours FINDINGS: Interval placement of right upper extremity PICC line. The catheter tip extends into  the right atrium. Although the tip is difficult to accurately delineate, it is felt to likely be within the inferior right atrium. Recommend retraction of the catheter approximately 3 cm. Bilateral pulmonary airspace disease without significant change. Long volumes are slightly improved bilaterally. IMPRESSION: PICC line tip extends into the right atrium with the tip difficult to visualize but likely within the inferior right atrium. Recommend retraction of the catheter by approximately 3 cm. Electronically Signed   By: Irish LackGlenn  Yamagata M.D.   On: 2016/09/03 15:58   Dg Chest Port 1 View  Result Date: 2016/09/03 CLINICAL  DATA:  Cough.  Right sided row walls. EXAM: PORTABLE CHEST 1 VIEW COMPARISON:  07/16/2017 FINDINGS: There are new bilateral airspace lung opacities. This is most confluent in the right lower lung zone, with more ground-glass type opacity seen more diffusely in the central lungs. Probable small effusions.  No pneumothorax. Cardiac silhouette is borderline enlarged. No mediastinal or hilar masses. Left anterior chest wall single lead pacemaker is stable. Skeletal structures are demineralized. IMPRESSION: 1. New bilateral airspace lung opacities most confluent in the right lower lobe. Findings are consistent with multifocal pneumonia or asymmetric edema. Electronically Signed   By: Amie Portland M.D.   On: 08/17/2017 09:51        Scheduled Meds: . Chlorhexidine Gluconate Cloth  6 each Topical Daily  . donepezil  10 mg Oral QHS   And  . memantine  28 mg Oral Daily  . feeding supplement (ENSURE ENLIVE)  237 mL Oral BID BM  . feeding supplement (GLUCERNA SHAKE)  237 mL Oral TID BM  . furosemide  40 mg Intravenous BID  . ipratropium  0.5 mg Nebulization Q6H  . levalbuterol  1.25 mg Nebulization Q6H  . mouth rinse  15 mL Mouth Rinse BID  . metoprolol tartrate  5 mg Intravenous Q6H  . multivitamin with minerals  1 tablet Oral Daily  . pantoprazole  40 mg Oral Daily  . sodium chloride  flush  10-40 mL Intracatheter Q12H  . warfarin  0.5 mg Oral Once  . Warfarin - Pharmacist Dosing Inpatient   Does not apply q1800   Continuous Infusions: . azithromycin Stopped (08/01/17 1134)  . diltiazem (CARDIZEM) infusion 7.5 mg/hr (08/02/17 0339)  . piperacillin-tazobactam (ZOSYN)  IV 3.375 g (08/02/17 5188)     LOS: 2 days    Time spent:    Erick Blinks, MD [facility] Pager [phone]  If 7PM-7AM, please contact night-coverage [email or web address] Password [email or web address] 08/02/2017, 9:18 AM

## 2017-08-02 NOTE — Progress Notes (Signed)
Placed patient on Partial rebreathing mask 10 lpm. Had already turned nasal cannula to 6 lpm, she continued to drop saturation which would return to normal whit stimulation even though she appeared not to be sleeping.  Oxygen  Saturation now 100 percent. Will Titrate as tolerated.

## 2017-08-02 NOTE — Progress Notes (Signed)
Patient is about 3 liter positive by chart.

## 2017-08-02 NOTE — Progress Notes (Signed)
ANTICOAGULATION CONSULT NOTE   Pharmacy Consult for coumadin Indication: atrial fibrillation  No Known Allergies  Patient Measurements: Height: 5\' 6"  (167.6 cm) Weight: 124 lb 12.5 oz (56.6 kg) IBW/kg (Calculated) : 59.3   Vital Signs: Temp: 97.5 F (36.4 C) (12/24 0726) Temp Source: Axillary (12/24 0726) BP: 145/85 (12/24 0630) Pulse Rate: 92 (12/24 0726)  Labs: Recent Labs    08/02/2017 0927 07/27/2017 1615 08/01/17 0409 08/02/17 0632  HGB 12.3  --  11.3* 11.2*  HCT 40.3  --  38.8 38.0  PLT 199  --  154 161  APTT  --  49*  --   --   LABPROT 24.5*  --  29.9* 32.3*  INR 2.23  --  2.88 3.17  CREATININE 1.62*  --  1.36* 1.50*  TROPONINI <0.03  --   --   --    Estimated Creatinine Clearance: 24.1 mL/min (A) (by C-G formula based on SCr of 1.5 mg/dL (H)).  Medications:  Was on coumadin 2mg  po daily PTA  Assessment: 83 yo lady to continue coumadin for afib. Admission INR was therapeutic and INR elevated today.  No bleeding noted.  Will likely need lower doses than home dose while on antibiotics and poor appetite.  Warfarin dose not given on 12/23 (unable to swallow PO).  Goal of Therapy:  INR 2-3 Monitor platelets by anticoagulation protocol: Yes   Plan:  No coumadin today Daily PT/INR Monitor for bleeding complications  Mady Gemma 08/02/2017,9:49 AM

## 2017-08-03 LAB — LEGIONELLA PNEUMOPHILA SEROGP 1 UR AG: L. pneumophila Serogp 1 Ur Ag: NEGATIVE

## 2017-08-03 LAB — BASIC METABOLIC PANEL
ANION GAP: 12 (ref 5–15)
BUN: 43 mg/dL — AB (ref 6–20)
CALCIUM: 8.5 mg/dL — AB (ref 8.9–10.3)
CO2: 24 mmol/L (ref 22–32)
Chloride: 108 mmol/L (ref 101–111)
Creatinine, Ser: 1.41 mg/dL — ABNORMAL HIGH (ref 0.44–1.00)
GFR calc Af Amer: 38 mL/min — ABNORMAL LOW (ref >60)
GFR calc non Af Amer: 33 mL/min — ABNORMAL LOW (ref >60)
GLUCOSE: 131 mg/dL — AB (ref 65–99)
Potassium: 3.7 mmol/L (ref 3.5–5.1)
Sodium: 144 mmol/L (ref 135–145)

## 2017-08-03 LAB — CBC
HEMATOCRIT: 35.3 % — AB (ref 36.0–46.0)
Hemoglobin: 10.4 g/dL — ABNORMAL LOW (ref 12.0–15.0)
MCH: 28.4 pg (ref 26.0–34.0)
MCHC: 29.5 g/dL — ABNORMAL LOW (ref 30.0–36.0)
MCV: 96.4 fL (ref 78.0–100.0)
Platelets: 135 10*3/uL — ABNORMAL LOW (ref 150–400)
RBC: 3.66 MIL/uL — ABNORMAL LOW (ref 3.87–5.11)
RDW: 16.4 % — AB (ref 11.5–15.5)
WBC: 10.8 10*3/uL — AB (ref 4.0–10.5)

## 2017-08-03 LAB — PROTIME-INR
INR: 3.9
Prothrombin Time: 37.9 seconds — ABNORMAL HIGH (ref 11.4–15.2)

## 2017-08-03 MED ORDER — LORAZEPAM 2 MG/ML IJ SOLN
0.5000 mg | INTRAMUSCULAR | Status: AC | PRN
  Administered 2017-08-03: 1 mg via INTRAVENOUS
  Filled 2017-08-03: qty 1

## 2017-08-03 NOTE — Progress Notes (Signed)
Patient very restless tonight. PRN Haldol seems to have no effect. Respiratory trying patient off of partial rebreather. She is now on HFNC @12L . Oral care performed on patient and moisturizer applied to lips and oral mucosa.  Pure wick is not working well for patient. patient is moving around in bed and therefore soiling bed. Unable to measure urine output.   Will continue to monitor Genelle Bal, RN

## 2017-08-03 NOTE — Progress Notes (Signed)
ANTICOAGULATION CONSULT NOTE   Pharmacy Consult for coumadin Indication: atrial fibrillation  No Known Allergies  Patient Measurements: Height: 5\' 6"  (167.6 cm) Weight: 127 lb 13.9 oz (58 kg) IBW/kg (Calculated) : 59.3   Vital Signs: Temp: 97.6 F (36.4 C) (12/25 0300) Temp Source: Axillary (12/25 0300) BP: 120/71 (12/25 0800) Pulse Rate: 81 (12/25 0800)  Labs: Recent Labs    2017/08/19 0927 08-19-2017 1615 08/01/17 0409 08/02/17 0632 08/03/17 0500  HGB 12.3  --  11.3* 11.2* 10.4*  HCT 40.3  --  38.8 38.0 35.3*  PLT 199  --  154 161 135*  APTT  --  49*  --   --   --   LABPROT 24.5*  --  29.9* 32.3* 37.9*  INR 2.23  --  2.88 3.17 3.90  CREATININE 1.62*  --  1.36* 1.50* 1.41*  TROPONINI <0.03  --   --   --   --    Estimated Creatinine Clearance: 26.2 mL/min (A) (by C-G formula based on SCr of 1.41 mg/dL (H)).  Medications:  Was on coumadin 2mg  po daily PTA  Assessment: 83 yo lady to continue coumadin for afib. Admission INR was therapeutic and INR remains elevated today.  No bleeding noted.  Will likely need lower doses than home dose while on antibiotics and poor appetite.  Warfarin dose not given on 12/23 (unable to swallow PO).  Goal of Therapy:  INR 2-3 Monitor platelets by anticoagulation protocol: Yes   Plan:  No coumadin today Daily PT/INR Monitor for bleeding complications  Pricila Bridge P[place]et 08/03/2017,8:37 AM

## 2017-08-03 NOTE — Progress Notes (Signed)
PROGRESS NOTE    [name]  RUE:[id number] DOB: Feb 22, 1929 DOA: 08-09-17 PCP: Ignatius Specking, MD    Brief Narrative:  83 year old female with a history of chronic respiratory failure, dementia, atrial fibrillation and diastolic heart failure, presents to the hospital with generalized weakness and cough.  Found to have possible aspiration pneumonia and admitted to the hospital for further treatments.  Patient developed rapid atrial fibrillation and was started on Cardizem infusion.  She is currently on intravenous antibiotics and IV fluids.   Assessment & Plan:   Active Problems:   Acute renal failure superimposed on stage 3 chronic kidney disease (HCC)   Sepsis (HCC)   Sepsis due to undetermined organism (HCC)   Chronic diastolic CHF (congestive heart failure) (HCC)   Acute on chronic respiratory failure with hypoxia (HCC)   Acute metabolic encephalopathy   Lobar pneumonia (HCC)   Permanent atrial fibrillation (HCC)   Dementia without behavioral disturbance   1. Sepsis.  Secondary to pneumonia.  Lactic acid 1.5 on admission, trended down with IV fluids.  Influenza panel negative.  Continue intravenous antibiotics. 2. Lobar pneumonia.  Underlying aspiration is a concern.  Speech therapy has been consulted.  Strep pneumo antigen negative.  Continue on Zosyn and azithromycin for now. 3. Dysphagia.  Staff has noted significant dysphagia with any p.o. intake.  Speech therapy evaluated patient and currently recommends n.p.o.  She would likely need modified barium swallow, but clinically still is able to do this.. 4. Acute on chronic respiratory failure with hypoxia.  Related to pneumonia/pulmonary edema.  Chronically on 2 L.  Currently requiring partial rebreather mask.  Wean down oxygen as tolerated. 5. Acute on chronic renal failure, chronic kidney disease stage III.  Creatinine on presentation of 1.6.  Creatinine stable.  Continue to monitor in the setting of diuresis.  Continue  to follow 6. urinary tract infection.  Too numerous to count WBCs on urinalysis.  Continue on IV antibiotics and follow-up urine culture. 7. Acute metabolic encephalopathy.  Suspect this is related to sepsis.  Mental status is waxing and waning.  Continue to monitor.  She has baseline dementia. 8. Atrial fibrillation with rapid ventricular response.  Currently on intravenous metoprolol as well as intravenous Cardizem infusion to control heart rate.  Transition to p.o. once swallowing issues have been addressed.  She is anticoagulated with Coumadin. 9. Acute on Chronic diastolic CHF.  Chest x-ray shows worsening pulmonary edema.  Patient has elevated BNP.  Likely precipitated by IV hydration for pneumonia.  Continue on IV Lasix 10. Goals of care. Discussed with son.  We reviewed the patient's baseline medical problems, chronic decline, current state and expected prognosis.  I have strongly recommended to DNR status and pending swallow evaluation, patient may be appropriate for hospice.  His son will discuss this with the patient's father.  Continue current treatments for now.   DVT prophylaxis: Coumadin Code Status: Full code Family Communication: Discussed with son at the bedside Disposition Plan: Return home on discharge, pending hospital course   Consultants:     Procedures:     Antimicrobials:   Zosyn 12/22 >  Azithromycin 12/23 >   Subjective: Patient is lethargic. Does not respond to questions or follows commands.  Objective: Vitals:   08/03/17 0700 08/03/17 0730 08/03/17 0800 08/03/17 0925  BP: 128/72 120/73 120/71   Pulse: 80 90 81   Resp: 14 11 14    Temp:      TempSrc:      SpO2: 97% 97%  97% 97%  Weight:      Height:        Intake/Output Summary (Last 24 hours) at 08/03/2017 1031 Last data filed at 08/03/2017 0700 Gross per 24 hour  Intake 268.67 ml  Output -  Net 268.67 ml   Filed Weights   07/27/2017 0919 08/01/17 0530 08/03/17 0300  Weight: 52.2 kg (115  lb) 56.6 kg (124 lb 12.5 oz) 58 kg (127 lb 13.9 oz)    Examination:  General exam: Appears calm and comfortable  Respiratory system: Bilateral crackles.  Increased respiratory effort. Cardiovascular system: S1 & S2 heard, irregular. No JVD, murmurs, rubs, gallops or clicks. No pedal edema. Gastrointestinal system: Abdomen is nondistended, soft and nontender. No organomegaly or masses felt. Normal bowel sounds heard. Central nervous system: No focal neurological deficits. Extremities: Symmetric 5 x 5 power. Skin: No rashes, lesions or ulcers Psychiatry: somnolent, grimaces to voice    Data Reviewed: I have personally reviewed following labs and imaging studies  CBC: Recent Labs  Lab 08/02/2017 0927 08/01/17 0409 08/02/17 0632 08/03/17 0500  WBC 16.3* 10.8* 11.1* 10.8*  NEUTROABS 14.7*  --   --   --   HGB 12.3 11.3* 11.2* 10.4*  HCT 40.3 38.8 38.0 35.3*  MCV 93.9 96.5 97.9 96.4  PLT 199 154 161 135*   Basic Metabolic Panel: Recent Labs  Lab 08/02/2017 0927 08/01/17 0409 08/02/17 0632 08/03/17 0500  NA 142 143 143 144  K 4.9 4.3 4.2 3.7  CL 102 106 107 108  CO2 26 26 23 24   GLUCOSE 135* 115* 127* 131*  BUN 38* 38* 37* 43*  CREATININE 1.62* 1.36* 1.50* 1.41*  CALCIUM 9.5 8.7* 8.7* 8.5*  MG  --  1.9  --   --    GFR: Estimated Creatinine Clearance: 26.2 mL/min (A) (by C-G formula based on SCr of 1.41 mg/dL (H)). Liver Function Tests: Recent Labs  Lab 07/26/2017 0927  AST 51*  ALT 27  ALKPHOS 90  BILITOT 1.4*  PROT 6.6  ALBUMIN 3.4*   No results for input(s): LIPASE, AMYLASE in the last 168 hours. Recent Labs  Lab 08/01/17 1348  AMMONIA 36*   Coagulation Profile: Recent Labs  Lab 07/30/2017 0927 08/01/17 0409 08/02/17 0632 08/03/17 0500  INR 2.23 2.88 3.17 3.90   Cardiac Enzymes: Recent Labs  Lab 07/12/2017 0927  TROPONINI <0.03   BNP (last 3 results) No results for input(s): PROBNP in the last 8760 hours. HbA1C: No results for input(s): HGBA1C in  the last 72 hours. CBG: No results for input(s): GLUCAP in the last 168 hours. Lipid Profile: No results for input(s): CHOL, HDL, LDLCALC, TRIG, CHOLHDL, LDLDIRECT in the last 72 hours. Thyroid Function Tests: No results for input(s): TSH, T4TOTAL, FREET4, T3FREE, THYROIDAB in the last 72 hours. Anemia Panel: No results for input(s): VITAMINB12, FOLATE, FERRITIN, TIBC, IRON, RETICCTPCT in the last 72 hours. Sepsis Labs: Recent Labs  Lab 07/18/2017 0936 07/10/2017 1615  PROCALCITON  --  2.44  LATICACIDVEN 1.54 0.8    Recent Results (from the past 240 hour(s))  Urine Culture     Status: Abnormal   Collection Time: 07/28/2017  9:26 AM  Result Value Ref Range Status   Specimen Description URINE, CATHETERIZED  Final   Special Requests NONE  Final   Culture >=100,000 COLONIES/mL SERRATIA MARCESCENS (A)  Final   Report Status 08/02/2017 FINAL  Final   Organism ID, Bacteria SERRATIA MARCESCENS (A)  Final      Susceptibility   Serratia  marcescens - MIC*    CEFAZOLIN >=64 RESISTANT Resistant     CEFTRIAXONE <=1 SENSITIVE Sensitive     CIPROFLOXACIN <=0.25 SENSITIVE Sensitive     GENTAMICIN <=1 SENSITIVE Sensitive     NITROFURANTOIN 128 RESISTANT Resistant     TRIMETH/SULFA <=20 SENSITIVE Sensitive     * >=100,000 COLONIES/mL SERRATIA MARCESCENS  Blood Culture (routine x 2)     Status: None (Preliminary result)   Collection Time: 08/09/2017  9:34 AM  Result Value Ref Range Status   Specimen Description   Final    BLOOD RIGHT ANTECUBITAL Blood Culture adequate volume   Special Requests BOTTLES DRAWN AEROBIC AND ANAEROBIC  Final   Culture NO GROWTH 3 DAYS  Final   Report Status PENDING  Incomplete  Blood Culture (routine x 2)     Status: None (Preliminary result)   Collection Time: 07/19/2017  9:45 AM  Result Value Ref Range Status   Specimen Description   Final    BLOOD BLOOD LEFT ARM IV BOTTLES DRAWN AEROBIC AND ANAEROBIC   Special Requests Blood Culture adequate volume  Final   Culture  NO GROWTH 3 DAYS  Final   Report Status PENDING  Incomplete  MRSA PCR Screening     Status: None   Collection Time: 07/10/2017  2:03 PM  Result Value Ref Range Status   MRSA by PCR NEGATIVE NEGATIVE Final    Comment:        The GeneXpert MRSA Assay (FDA approved for NASAL specimens only), is one component of a comprehensive MRSA colonization surveillance program. It is not intended to diagnose MRSA infection nor to guide or monitor treatment for MRSA infections.          Radiology Studies: Dg Chest Port 1 View  Result Date: 08/02/2017 CLINICAL DATA:  Hypoxia. EXAM: PORTABLE CHEST 1 VIEW COMPARISON:  07/21/2017 and 07/16/2017 FINDINGS: The bilateral pulmonary infiltrates have progressed particularly in the left perihilar region. Increased density at the right lung base suggests a subpulmonic effusion. Probable small left effusion. Heart size and pulmonary vascularity remain normal. PICC tip is in the right atrium and could be retracted 3 cm. Pacemaker in place. No acute bone abnormality. Old fracture of the proximal left humerus. Aortic atherosclerosis. IMPRESSION: Progression of bilateral pulmonary infiltrates with evidence consistent with small bilateral new pleural effusions. PICC tip is in the right atrium and could be retracted 3 cm. Electronically Signed   By: Francene Boyers M.D.   On: 08/02/2017 09:06        Scheduled Meds: . Chlorhexidine Gluconate Cloth  6 each Topical Daily  . donepezil  10 mg Oral QHS   And  . memantine  28 mg Oral Daily  . feeding supplement (ENSURE ENLIVE)  237 mL Oral BID BM  . feeding supplement (GLUCERNA SHAKE)  237 mL Oral TID BM  . furosemide  40 mg Intravenous BID  . ipratropium  0.5 mg Nebulization Q6H  . levalbuterol  1.25 mg Nebulization Q6H  . mouth rinse  15 mL Mouth Rinse BID  . metoprolol tartrate  5 mg Intravenous Q6H  . multivitamin with minerals  1 tablet Oral Daily  . pantoprazole  40 mg Oral Daily  . sodium chloride flush   10-40 mL Intracatheter Q12H  . warfarin  0.5 mg Oral Once  . Warfarin - Pharmacist Dosing Inpatient   Does not apply Q24H   Continuous Infusions: . azithromycin Stopped (08/02/17 1106)  . diltiazem (CARDIZEM) infusion 2.5 mg/hr (08/03/17 0821)  .  piperacillin-tazobactam (ZOSYN)  IV Stopped (08/03/17 0906)     LOS: 3 days    Time spent: 30mins    Erick BlinksJehanzeb Memon, MD [facility] Pager [phone]  If 7PM-7AM, please contact night-coverage [email or web address] Password [email or web address] 08/03/2017, 10:31 AM

## 2017-08-03 NOTE — Progress Notes (Signed)
Patient still on partial rebreathing mask .Possible aspiration vs fluid. Suspect more fluid than aspiration. Unable to keep up with In/out, Patient has increased weight since first check. BNP elevated., X-ray worsening. Spoke to nurse yesterday and today. Patient also has chronic kidney failure. Will monitor.

## 2017-08-03 NOTE — Progress Notes (Signed)
Patient continued to be restless, pulling at cords, and playing in urine. RN paged MD and received order for PRN ativan 0.5-1mg  Q4.  1mg  of ativan given and effective. Pt is resting. Placed back on partial rebreather before ativan given due to pt work of breathing increasing and O2 sats dropping. While on HFNC @12  patient did maintain sats until she became restless/agitated. Pure Wick removed per family and pt request. She has been asking for bedpan.  Genelle Bal, RN

## 2017-08-04 LAB — BASIC METABOLIC PANEL
Anion gap: 16 — ABNORMAL HIGH (ref 5–15)
BUN: 37 mg/dL — ABNORMAL HIGH (ref 6–20)
CHLORIDE: 106 mmol/L (ref 101–111)
CO2: 27 mmol/L (ref 22–32)
Calcium: 8.8 mg/dL — ABNORMAL LOW (ref 8.9–10.3)
Creatinine, Ser: 1.3 mg/dL — ABNORMAL HIGH (ref 0.44–1.00)
GFR calc non Af Amer: 36 mL/min — ABNORMAL LOW (ref >60)
GFR, EST AFRICAN AMERICAN: 42 mL/min — AB (ref >60)
Glucose, Bld: 118 mg/dL — ABNORMAL HIGH (ref 65–99)
POTASSIUM: 2.9 mmol/L — AB (ref 3.5–5.1)
SODIUM: 149 mmol/L — AB (ref 135–145)

## 2017-08-04 LAB — PROTIME-INR
INR: 3.99
Prothrombin Time: 39.6 seconds — ABNORMAL HIGH (ref 11.4–15.2)

## 2017-08-04 MED ORDER — FUROSEMIDE 10 MG/ML IJ SOLN
40.0000 mg | Freq: Every day | INTRAMUSCULAR | Status: DC
  Administered 2017-08-04 – 2017-08-06 (×3): 40 mg via INTRAVENOUS
  Filled 2017-08-04 (×3): qty 4

## 2017-08-04 MED ORDER — POTASSIUM CHLORIDE 10 MEQ/100ML IV SOLN
10.0000 meq | INTRAVENOUS | Status: AC
  Administered 2017-08-04 (×6): 10 meq via INTRAVENOUS
  Filled 2017-08-04 (×6): qty 100

## 2017-08-04 NOTE — Progress Notes (Signed)
ANTICOAGULATION CONSULT NOTE   Pharmacy Consult for coumadin Indication: atrial fibrillation  No Known Allergies  Patient Measurements: Height: 5\' 6"  (167.6 cm) Weight: 127 lb 13.9 oz (58 kg) IBW/kg (Calculated) : 59.3   Vital Signs: Temp: 97.4 F (36.3 C) (12/26 0823) Temp Source: Oral (12/26 0823) BP: 170/76 (12/26 0800) Pulse Rate: 30 (12/26 0800)  Labs: Recent Labs    08/02/17 0632 08/03/17 0500 08/04/17 0520  HGB 11.2* 10.4*  --   HCT 38.0 35.3*  --   PLT 161 135*  --   LABPROT 32.3* 37.9* 39.6*  INR 3.17 3.90 3.99  CREATININE 1.50* 1.41* 1.30*   Estimated Creatinine Clearance: 28.4 mL/min (A) (by C-G formula based on SCr of 1.3 mg/dL (H)).  Medications:  Was on coumadin 2mg  po daily PTA  Assessment: 83 yo lady to continue coumadin for afib. Admission INR was therapeutic. No bleeding noted.  Will likely need lower doses than home dose while on antibiotics and poor appetite.  Warfarin dose not given on 12/23 (unable to swallow PO). Last dose given was 12/22.  INR remains elevated today.   Goal of Therapy:  INR 2-3 Monitor platelets by anticoagulation protocol: Yes   Plan:  No coumadin today Daily PT/INR Monitor for bleeding complications  Elder Cyphers, BS Loura Back, BCPS Clinical Pharmacist Pager [phone] 08/04/2017,8:56 AM

## 2017-08-04 NOTE — Progress Notes (Signed)
  Speech Language Pathology Treatment: Dysphagia  Patient Details Name: [name] MRN: [id number] DOB: 02-04-1929 Today's Date: 08/04/2017 Time: 3790-2409 SLP Time Calculation (min) (ACUTE ONLY): 28 min  Assessment / Plan / Recommendation Clinical Impression  Pt seen at bedside for ongoing diagnostic dysphagia therapy with family present in room. Pt is no longer on NRB and tolerating HFNC at 9L. Pt with stronger voice and cough this date. Pt consumed ice chips, 340cc water, and 4 oz puree without overt signs of symptoms of aspiration when SLP provided rate control and feeding assist. Education completed with spouse in regards for importance of slow rate of feeding, as he endorsed feeling like he was feeding her too quickly the other day when she choked on potato pieces. Pt has a reported history of esophageal dysphagia without workup at [facility] so records unable to be seen. Recommend initiating D1/puree with thin liquids with 100% feeder assist to ensure slow rate of intake in upright position. Pt may benefit from [facility] tomorrow with esophageal sweep due to history of esophageal dysphagia. Pt with immediate coughing and desaturations on Monday with po trials and this was not observed today. If Pt becomes short of breath or coughing with intake, withhold po. Strict aspiration and reflux precautions.     HPI HPI: 83 year old female with a history of chronic respiratory failure, dementia, atrial fibrillation and diastolic heart failure, presents to the hospital with generalized weakness and cough.  Found to have possible aspiration pneumonia and admitted to the hospital for further treatments.  Patient developed rapid atrial fibrillation and was started on Cardizem infusion.  She is currently on intravenous antibiotics and IV fluids.      SLP Plan  Continue with current plan of care       Recommendations  Diet recommendations: Dysphagia 1 (puree);Thin liquid Liquids provided via:  Cup;Straw Medication Administration: Crushed with puree Supervision: Staff to assist with self feeding;Full supervision/cueing for compensatory strategies Compensations: Slow rate;Small sips/bites;Multiple dry swallows after each bite/sip Postural Changes and/or Swallow Maneuvers: Seated upright 90 degrees;Upright 30-60 min after meal                Oral Care Recommendations: Oral care BID;Staff/trained caregiver to provide oral care Follow up Recommendations: 24 hour supervision/assistance SLP Visit Diagnosis: Dysphagia, oropharyngeal phase (R13.12) Plan: Continue with current plan of care       Thank you,  Havery Moros, CCC-SLP [phone]                 [name] 08/04/2017, 11:31 AM

## 2017-08-04 NOTE — Consult Note (Signed)
Consult requested by: [facility], Dr.Memon  Consult requested for: Acute on chronic respiratory failure/aspiration pneumonia  HPI: This is an 83 year old who came to the emergency department with increased confusion and lethargy.  She had gotten choked on some Tea and may have aspirated. The family and her caretaker who are at bedside provide most of the history.  I also reviewed the medical record.  Her caretaker says that normally she does not have swallowing and that she frequently even takes a number of pills together with no water without any difficulty.  She is a lifelong non-smoker.  She has been on oxygen at home.  She has had more difficulty with transfers at home over the last 24-48 hours before admission.  Since she is been in the hospital she developed atrial fib with rapid ventricular response and she is on IV diltiazem.  Her mental status was poor enough that she could not do a swallowing evaluation.  She currently denies any pain but with her dementia is not clear how accurate that is. Past Medical History:  Diagnosis Date  . Chronic atrial fibrillation (HCC)   . Chronic diastolic CHF (congestive heart failure) (HCC)   . CKD (chronic kidney disease), stage III (HCC)   . Coronary artery disease   . Dementia   . Encounter for long-term (current) use of other medications    COUMADIN THERAPY  . Essential hypertension   . GERD (gastroesophageal reflux disease)   . Lower extremity weakness   . Pericardial effusion    a. Echo 08/2014: mod pericardial effusion.  . Renal artery stenosis (HCC)    a. right renal artery stenosis (1-59% by duplex in 04/2013).  . Sinoatrial node dysfunction (HCC)     SINUS BRADYCARDIA  . Sinus bradycardia      Family History  Problem Relation Age of Onset  . Heart failure Mother   . High blood pressure Mother   . Cancer Brother   . Cirrhosis Other      Social History   Socioeconomic History  . Marital status: Married    Spouse name:  [name]  . Number of children: 0  . Years of education: 2  . Highest education level: None  Social Needs  . Financial resource strain: None  . Food insecurity - worry: None  . Food insecurity - inability: None  . Transportation needs - medical: None  . Transportation needs - non-medical: None  Occupational History  . Occupation: RETIRED    Employer: RETIRED  Tobacco Use  . Smoking status: Never Smoker  . Smokeless tobacco: Never Used  . Tobacco comment: Never smoked  Substance and Sexual Activity  . Alcohol use: No    Alcohol/week: 0.0 oz  . Drug use: No  . Sexual activity: Not Currently  Other Topics Concern  . None  Social History Narrative   Patient lives at home with her husband [name]) in [place].  Patient is retired. Patient has 12 th grade education.   Right handed.   Caffeine- two three cups of coffee daily.     ROS: Not obtainable because of mental status other than as mentioned above with history provided by caretakers    Objective: Vital signs in last 24 hours: Temp:  [97.4 F (36.3 C)-97.5 F (36.4 C)] 97.4 F (36.3 C) (12/26 0823) Pulse Rate:  [25-123] 30 (12/26 0800) Resp:  [9-24] 16 (12/26 0800) BP: (116-179)/(55-144) 170/76 (12/26 0800) SpO2:  [90 %-100 %] 97 % (12/26 0833) Weight change:  Last  BM Date: 08/02/17  Intake/Output from previous day: 12/25 0701 - 12/26 0700 In: 125.9 [I.V.:25.9; IV Piggyback:100] Out: -   PHYSICAL EXAM Constitutional: She is awake and alert and able to have a conversation.  Eyes: Pupils react EOMI.  Ears nose mouth and throat: Still fairly poor gag reflex.  Her throat is clear.  Hearing is grossly normal.  Cardiovascular: Her heart is irregularly irregular with a rate around 90.  I do not hear a gallop.  Respiratory: Her respiratory effort is slightly increased.  She is wearing nasal oxygen.  She has some rales in the bases bilaterally.  Gastrointestinal: Her abdomen is soft with no masses.  Skin: Warm and dry.   Musculoskeletal: She is able to move all 4 extremities.  Psychiatric: She does not appear to be anxious.  Neurological: She is mildly confused but apparently more alert than previously.  Lab Results: Basic Metabolic Panel: Recent Labs    08/03/17 0500 08/04/17 0520  NA 144 149*  K 3.7 2.9*  CL 108 106  CO2 24 27  GLUCOSE 131* 118*  BUN 43* 37*  CREATININE 1.41* 1.30*  CALCIUM 8.5* 8.8*   Liver Function Tests: No results for input(s): AST, ALT, ALKPHOS, BILITOT, PROT, ALBUMIN in the last 72 hours. No results for input(s): LIPASE, AMYLASE in the last 72 hours. Recent Labs    08/01/17 1348  AMMONIA 36*   CBC: Recent Labs    08/02/17 0632 08/03/17 0500  WBC 11.1* 10.8*  HGB 11.2* 10.4*  HCT 38.0 35.3*  MCV 97.9 96.4  PLT 161 135*   Cardiac Enzymes: No results for input(s): CKTOTAL, CKMB, CKMBINDEX, TROPONINI in the last 72 hours. BNP: No results for input(s): PROBNP in the last 72 hours. D-Dimer: No results for input(s): DDIMER in the last 72 hours. CBG: No results for input(s): GLUCAP in the last 72 hours. Hemoglobin A1C: No results for input(s): HGBA1C in the last 72 hours. Fasting Lipid Panel: No results for input(s): CHOL, HDL, LDLCALC, TRIG, CHOLHDL, LDLDIRECT in the last 72 hours. Thyroid Function Tests: No results for input(s): TSH, T4TOTAL, FREET4, T3FREE, THYROIDAB in the last 72 hours. Anemia Panel: No results for input(s): VITAMINB12, FOLATE, FERRITIN, TIBC, IRON, RETICCTPCT in the last 72 hours. Coagulation: Recent Labs    08/03/17 0500 08/04/17 0520  LABPROT 37.9* 39.6*  INR 3.90 3.99   Urine Drug Screen: Drugs of Abuse  No results found for: LABOPIA, COCAINSCRNUR, LABBENZ, AMPHETMU, THCU, LABBARB  Alcohol Level: No results for input(s): ETH in the last 72 hours. Urinalysis: No results for input(s): COLORURINE, LABSPEC, PHURINE, GLUCOSEU, HGBUR, BILIRUBINUR, KETONESUR, PROTEINUR, UROBILINOGEN, NITRITE, LEUKOCYTESUR in the last 72  hours.  Invalid input(s): APPERANCEUR Misc. Labs:   ABGS: Recent Labs    08/01/17 1255  PHART 7.384  PO2ART 88.6  HCO3 23.3     MICROBIOLOGY: Recent Results (from the past 240 hour(s))  Urine Culture     Status: Abnormal   Collection Time: 07/21/2017  9:26 AM  Result Value Ref Range Status   Specimen Description URINE, CATHETERIZED  Final   Special Requests NONE  Final   Culture >=100,000 COLONIES/mL SERRATIA MARCESCENS (A)  Final   Report Status 08/02/2017 FINAL  Final   Organism ID, Bacteria SERRATIA MARCESCENS (A)  Final      Susceptibility   Serratia marcescens - MIC*    CEFAZOLIN >=64 RESISTANT Resistant     CEFTRIAXONE <=1 SENSITIVE Sensitive     CIPROFLOXACIN <=0.25 SENSITIVE Sensitive     GENTAMICIN <=1  SENSITIVE Sensitive     NITROFURANTOIN 128 RESISTANT Resistant     TRIMETH/SULFA <=20 SENSITIVE Sensitive     * >=100,000 COLONIES/mL SERRATIA MARCESCENS  Blood Culture (routine x 2)     Status: None (Preliminary result)   Collection Time: 07/30/2017  9:34 AM  Result Value Ref Range Status   Specimen Description   Final    BLOOD RIGHT ANTECUBITAL Blood Culture adequate volume   Special Requests BOTTLES DRAWN AEROBIC AND ANAEROBIC  Final   Culture NO GROWTH 4 DAYS  Final   Report Status PENDING  Incomplete  Blood Culture (routine x 2)     Status: None (Preliminary result)   Collection Time: 07/21/2017  9:45 AM  Result Value Ref Range Status   Specimen Description   Final    BLOOD BLOOD LEFT ARM IV BOTTLES DRAWN AEROBIC AND ANAEROBIC   Special Requests Blood Culture adequate volume  Final   Culture NO GROWTH 4 DAYS  Final   Report Status PENDING  Incomplete  MRSA PCR Screening     Status: None   Collection Time: 07/30/2017  2:03 PM  Result Value Ref Range Status   MRSA by PCR NEGATIVE NEGATIVE Final    Comment:        The GeneXpert MRSA Assay (FDA approved for NASAL specimens only), is one component of a comprehensive MRSA colonization surveillance program. It  is not intended to diagnose MRSA infection nor to guide or monitor treatment for MRSA infections.     Studies/Results: No results found.  Medications:  Prior to Admission:  Medications Prior to Admission  Medication Sig Dispense Refill Last Dose  . ALPRAZolam (XANAX) 0.5 MG tablet Take 0.5 mg by mouth at bedtime as needed for anxiety or sleep.   07/30/2017 at 1930  . Biotin 5000 MCG TABS Take by mouth daily. 10,000 mcg daily   07/30/2017 at Unknown time  . Calcium Carbonate-Vitamin D (CALTRATE 600+D PO) Take 1 tablet by mouth 2 (two) times daily.    07/30/2017 at Unknown time  . carvedilol (COREG) 12.5 MG tablet Take 6.25-12.5 mg by mouth 2 (two) times daily with a meal. Take 12.5 mg in the morning and 6.25 mg in the evening.   07/16/2017 at 0800  . Cyanocobalamin (VITAMIN B-12) 5000 MCG TBDP Take 1 tablet by mouth daily.   07/30/2017 at Unknown time  . diltiazem (CARDIZEM) 90 MG tablet Take 1 tablet (90 mg total) by mouth every evening.   07/14/2017 at 0800  . donepezil (ARICEPT) 10 MG tablet Take 1 tablet by mouth at bedtime.  6 07/30/2017 at Unknown time  . feeding supplement, GLUCERNA SHAKE, (GLUCERNA SHAKE) LIQD Take 237 mLs by mouth 3 (three) times daily between meals.   07/30/2017 at Unknown time  . furosemide (LASIX) 20 MG tablet Take 1 tablet (20 mg total) by mouth daily.   07/18/2017 at 0800  . HYDROcodone-acetaminophen (NORCO/VICODIN) 5-325 MG tablet Take 1 tablet by mouth every 8 (eight) hours as needed for pain.  0 07/30/2017 at Unknown time  . mirtazapine (REMERON) 15 MG tablet Take 1 tablet by mouth at bedtime.  5 07/30/2017 at Unknown time  . Multiple Vitamin (MULTIVITAMIN) tablet Take 1 tablet by mouth daily.    07/30/2017 at Unknown time  . NAMZARIC 28-10 MG CP24 take one capsule by mouth once daily  12 07/30/2017 at Unknown time  . omeprazole (PRILOSEC) 20 MG capsule Take 20 mg by mouth daily.   07/30/2017 at Unknown time  . potassium chloride (  K-DUR) 10 MEQ tablet  Take 1 tablet (10 mEq total) by mouth daily. 30 tablet 0 07/30/2017 at Unknown time  . warfarin (COUMADIN) 2 MG tablet Take 2 mg by mouth daily at 6 PM.   07/30/2017 at 1930   Scheduled: . Chlorhexidine Gluconate Cloth  6 each Topical Daily  . donepezil  10 mg Oral QHS   And  . memantine  28 mg Oral Daily  . feeding supplement (ENSURE ENLIVE)  237 mL Oral BID BM  . feeding supplement (GLUCERNA SHAKE)  237 mL Oral TID BM  . furosemide  40 mg Intravenous BID  . ipratropium  0.5 mg Nebulization Q6H  . levalbuterol  1.25 mg Nebulization Q6H  . mouth rinse  15 mL Mouth Rinse BID  . metoprolol tartrate  5 mg Intravenous Q6H  . multivitamin with minerals  1 tablet Oral Daily  . pantoprazole  40 mg Oral Daily  . sodium chloride flush  10-40 mL Intracatheter Q12H  . warfarin  0.5 mg Oral Once  . Warfarin - Pharmacist Dosing Inpatient   Does not apply Q24H   Continuous: . azithromycin 500 mg (08/04/17 0924)  . diltiazem (CARDIZEM) infusion 5 mg/hr (08/04/17 0334)  . piperacillin-tazobactam (ZOSYN)  IV Stopped (08/04/17 0924)   UJW:[id number]:acetaminophen **OR** acetaminophen, haloperidol lactate, ondansetron **OR** ondansetron (ZOFRAN) IV, sodium chloride flush  Assesment: She was admitted with pneumonia, sepsis and acute on chronic hypoxic respiratory failure.  She has been requiring very high FiO2 and is now on high flow nasal cannula at 9 L/min with O2 saturation in the mid 90s.  It is felt that she aspirated and I think that is probably true.  She is on appropriate treatment for that.  She has significant issues with atrial fib and I think she has some heart failure as well and she seems to have improved with some diuresis.  Her sepsis has resolved.  She had acute on chronic renal failure which has improved.  Her metabolic encephalopathy is better. Active Problems:   Acute renal failure superimposed on stage 3 chronic kidney disease (HCC)   Sepsis (HCC)   Sepsis due to undetermined organism (HCC)    Chronic diastolic CHF (congestive heart failure) (HCC)   Acute on chronic respiratory failure with hypoxia (HCC)   Acute metabolic encephalopathy   Lobar pneumonia (HCC)   Permanent atrial fibrillation (HCC)   Dementia without behavioral disturbance    Plan: I think she is on appropriate treatment.  She seems to be improving a little bit based on the medical record. Continue IV antibiotics.  She does not have a history of COPD so I do not know that adding steroids will help.  Continue inhaled bronchodilators   LOS: 4 days   Jafar Poffenberger L 08/04/2017, 9:19 AM

## 2017-08-04 NOTE — Progress Notes (Signed)
PROGRESS NOTE    [name]  YHC:[id number] DOB: August 31, 1928 DOA: 08/02/2017 PCP: Ignatius Specking, MD    Brief Narrative:  83 year old female with a history of chronic respiratory failure, dementia, atrial fibrillation and diastolic heart failure, presents to the hospital with generalized weakness and cough.  Found to have possible aspiration pneumonia and admitted to the hospital for further treatments.  Patient developed rapid atrial fibrillation and was started on Cardizem infusion.  She is currently on intravenous antibiotics and IV fluids.   Assessment & Plan:   Active Problems:   Acute renal failure superimposed on stage 3 chronic kidney disease (HCC)   Sepsis (HCC)   Sepsis due to undetermined organism (HCC)   Chronic diastolic CHF (congestive heart failure) (HCC)   Acute on chronic respiratory failure with hypoxia (HCC)   Acute metabolic encephalopathy   Lobar pneumonia (HCC)   Permanent atrial fibrillation (HCC)   Dementia without behavioral disturbance   1. Sepsis.  Secondary to pneumonia.  Lactic acid 1.5 on admission, trended down with IV fluids.  Influenza panel negative.  Continue intravenous antibiotics. 2. Aspiration pneumonia. Speech therapy has been consulted.  Strep pneumo antigen negative.  Continue on Zosyn for now. 3. Dysphagia.  Staff has noted significant dysphagia with any p.o. intake.  Speech therapy evaluated patient and currently recommends n.p.o.  She would likely need modified barium swallow, but needs to be clinically stable to do this. 4. Acute on chronic respiratory failure with hypoxia.  Related to pneumonia/pulmonary edema.  Chronically on 2 L.  Required partial rebreather mask.  Currently, she has been weaned down to 9L HFNC. Wean down oxygen as tolerated. 5. Acute on chronic renal failure, chronic kidney disease stage III.  Creatinine on presentation of 1.6.  Creatinine stable.  Continue to monitor in the setting of diuresis.  Trending down to  1.3 today. Continue to follow 6. Urinary tract infection.  Too numerous to count WBCs on urinalysis.  Urine culture positive for serratia. This should be covered by IV Zosyn. Continue on IV antibiotics. 7. Acute metabolic encephalopathy.  Suspect this is related to sepsis.  Mental status is waxing and waning.  Continue to monitor.  She has baseline dementia. 8. Atrial fibrillation with rapid ventricular response.  Currently on intravenous metoprolol as well as intravenous Cardizem infusion to control heart rate.  Transition to p.o. once swallowing issues have been addressed.  She is anticoagulated with Coumadin. 9. Acute on Chronic diastolic CHF.  Chest x-ray shows worsening pulmonary edema.  Patient has elevated BNP.  Likely precipitated by IV hydration for pneumonia.  Continue on IV Lasix. Overall volume status improving. 10. Hypokalemia. Related to diuretics. Replace. 11. Goals of care. Discussed with son.  We reviewed the patient's baseline medical problems, chronic decline, current state and expected prognosis.  I have strongly recommended to DNR status and pending swallow evaluation, patient may be appropriate for hospice.  His son will discuss this with the patient's husband.  Continue current treatments for now.   DVT prophylaxis: Coumadin Code Status: Full code Family Communication: Discussed with son and husband at the bedside Disposition Plan: Return home on discharge, pending hospital course   Consultants:   Pulmonology  Speech therapy  Procedures:     Antimicrobials:   Zosyn 12/22 >  Azithromycin 12/23 >12/26   Subjective: Starting to have productive cough. More awake today. Denies shortness of breath  Objective: Vitals:   08/04/17 0730 08/04/17 0800 08/04/17 0823 08/04/17 0833  BP: (!) 154/70 Marland Kitchen)  170/76    Pulse: 92 (!) 30    Resp: 13 16    Temp:   (!) 97.4 F (36.3 C)   TempSrc:   Oral   SpO2: 98% 93%  97%  Weight:      Height:        Intake/Output  Summary (Last 24 hours) at 08/04/2017 1019 Last data filed at 08/03/2017 2212 Gross per 24 hour  Intake 125.88 ml  Output -  Net 125.88 ml   Filed Weights   07/28/2017 0919 08/01/17 0530 08/03/17 0300  Weight: 52.2 kg (115 lb) 56.6 kg (124 lb 12.5 oz) 58 kg (127 lb 13.9 oz)    Examination:  General exam: Appears calm and comfortable  Respiratory system: Bilateral crackles.  normal respiratory effort. Cardiovascular system: S1 & S2 heard, irregular. No JVD, murmurs, rubs, gallops or clicks. No pedal edema. Gastrointestinal system: Abdomen is nondistended, soft and nontender. No organomegaly or masses felt. Normal bowel sounds heard. Central nervous system: No focal neurological deficits. Extremities: Symmetric 5 x 5 power. Skin: No rashes, lesions or ulcers Psychiatry: awake, pleasant    Data Reviewed: I have personally reviewed following labs and imaging studies  CBC: Recent Labs  Lab 08/06/2017 0927 08/01/17 0409 08/02/17 0632 08/03/17 0500  WBC 16.3* 10.8* 11.1* 10.8*  NEUTROABS 14.7*  --   --   --   HGB 12.3 11.3* 11.2* 10.4*  HCT 40.3 38.8 38.0 35.3*  MCV 93.9 96.5 97.9 96.4  PLT 199 154 161 135*   Basic Metabolic Panel: Recent Labs  Lab 07/30/2017 0927 08/01/17 0409 08/02/17 0632 08/03/17 0500 08/04/17 0520  NA 142 143 143 144 149*  K 4.9 4.3 4.2 3.7 2.9*  CL 102 106 107 108 106  CO2 26 26 23 24 27   GLUCOSE 135* 115* 127* 131* 118*  BUN 38* 38* 37* 43* 37*  CREATININE 1.62* 1.36* 1.50* 1.41* 1.30*  CALCIUM 9.5 8.7* 8.7* 8.5* 8.8*  MG  --  1.9  --   --   --    GFR: Estimated Creatinine Clearance: 28.4 mL/min (A) (by C-G formula based on SCr of 1.3 mg/dL (H)). Liver Function Tests: Recent Labs  Lab 07/17/2017 0927  AST 51*  ALT 27  ALKPHOS 90  BILITOT 1.4*  PROT 6.6  ALBUMIN 3.4*   No results for input(s): LIPASE, AMYLASE in the last 168 hours. Recent Labs  Lab 08/01/17 1348  AMMONIA 36*   Coagulation Profile: Recent Labs  Lab 07/20/2017 0927  08/01/17 0409 08/02/17 0632 08/03/17 0500 08/04/17 0520  INR 2.23 2.88 3.17 3.90 3.99   Cardiac Enzymes: Recent Labs  Lab 07/17/2017 0927  TROPONINI <0.03   BNP (last 3 results) No results for input(s): PROBNP in the last 8760 hours. HbA1C: No results for input(s): HGBA1C in the last 72 hours. CBG: No results for input(s): GLUCAP in the last 168 hours. Lipid Profile: No results for input(s): CHOL, HDL, LDLCALC, TRIG, CHOLHDL, LDLDIRECT in the last 72 hours. Thyroid Function Tests: No results for input(s): TSH, T4TOTAL, FREET4, T3FREE, THYROIDAB in the last 72 hours. Anemia Panel: No results for input(s): VITAMINB12, FOLATE, FERRITIN, TIBC, IRON, RETICCTPCT in the last 72 hours. Sepsis Labs: Recent Labs  Lab 08/04/2017 0936 08/02/2017 1615  PROCALCITON  --  2.44  LATICACIDVEN 1.54 0.8    Recent Results (from the past 240 hour(s))  Urine Culture     Status: Abnormal   Collection Time: 08/06/2017  9:26 AM  Result Value Ref Range Status  Specimen Description URINE, CATHETERIZED  Final   Special Requests NONE  Final   Culture >=100,000 COLONIES/mL SERRATIA MARCESCENS (A)  Final   Report Status 08/02/2017 FINAL  Final   Organism ID, Bacteria SERRATIA MARCESCENS (A)  Final      Susceptibility   Serratia marcescens - MIC*    CEFAZOLIN >=64 RESISTANT Resistant     CEFTRIAXONE <=1 SENSITIVE Sensitive     CIPROFLOXACIN <=0.25 SENSITIVE Sensitive     GENTAMICIN <=1 SENSITIVE Sensitive     NITROFURANTOIN 128 RESISTANT Resistant     TRIMETH/SULFA <=20 SENSITIVE Sensitive     * >=100,000 COLONIES/mL SERRATIA MARCESCENS  Blood Culture (routine x 2)     Status: None (Preliminary result)   Collection Time: 07/19/2017  9:34 AM  Result Value Ref Range Status   Specimen Description   Final    BLOOD RIGHT ANTECUBITAL Blood Culture adequate volume   Special Requests BOTTLES DRAWN AEROBIC AND ANAEROBIC  Final   Culture NO GROWTH 4 DAYS  Final   Report Status PENDING  Incomplete  Blood  Culture (routine x 2)     Status: None (Preliminary result)   Collection Time: 07/30/2017  9:45 AM  Result Value Ref Range Status   Specimen Description   Final    BLOOD BLOOD LEFT ARM IV BOTTLES DRAWN AEROBIC AND ANAEROBIC   Special Requests Blood Culture adequate volume  Final   Culture NO GROWTH 4 DAYS  Final   Report Status PENDING  Incomplete  MRSA PCR Screening     Status: None   Collection Time: 07/13/2017  2:03 PM  Result Value Ref Range Status   MRSA by PCR NEGATIVE NEGATIVE Final    Comment:        The GeneXpert MRSA Assay (FDA approved for NASAL specimens only), is one component of a comprehensive MRSA colonization surveillance program. It is not intended to diagnose MRSA infection nor to guide or monitor treatment for MRSA infections.          Radiology Studies: No results found.      Scheduled Meds: . Chlorhexidine Gluconate Cloth  6 each Topical Daily  . donepezil  10 mg Oral QHS   And  . memantine  28 mg Oral Daily  . feeding supplement (ENSURE ENLIVE)  237 mL Oral BID BM  . feeding supplement (GLUCERNA SHAKE)  237 mL Oral TID BM  . ipratropium  0.5 mg Nebulization Q6H  . levalbuterol  1.25 mg Nebulization Q6H  . mouth rinse  15 mL Mouth Rinse BID  . metoprolol tartrate  5 mg Intravenous Q6H  . multivitamin with minerals  1 tablet Oral Daily  . pantoprazole  40 mg Oral Daily  . sodium chloride flush  10-40 mL Intracatheter Q12H  . warfarin  0.5 mg Oral Once  . Warfarin - Pharmacist Dosing Inpatient   Does not apply Q24H   Continuous Infusions: . azithromycin 500 mg (08/04/17 0924)  . diltiazem (CARDIZEM) infusion 5 mg/hr (08/04/17 0334)  . piperacillin-tazobactam (ZOSYN)  IV Stopped (08/04/17 0924)  . potassium chloride       LOS: 4 days    Time spent:    Erick Blinks, MD [facility] Pager [phone]  If 7PM-7AM, please contact night-coverage [email or web address] Password [facility] 08/04/2017, 10:19 AM

## 2017-08-05 ENCOUNTER — Inpatient Hospital Stay (HOSPITAL_COMMUNITY): Payer: Medicare Other

## 2017-08-05 LAB — BASIC METABOLIC PANEL
ANION GAP: 14 (ref 5–15)
BUN: 32 mg/dL — ABNORMAL HIGH (ref 6–20)
CALCIUM: 8.8 mg/dL — AB (ref 8.9–10.3)
CO2: 30 mmol/L (ref 22–32)
Chloride: 102 mmol/L (ref 101–111)
Creatinine, Ser: 1.12 mg/dL — ABNORMAL HIGH (ref 0.44–1.00)
GFR, EST AFRICAN AMERICAN: 50 mL/min — AB (ref >60)
GFR, EST NON AFRICAN AMERICAN: 43 mL/min — AB (ref >60)
Glucose, Bld: 151 mg/dL — ABNORMAL HIGH (ref 65–99)
Potassium: 3 mmol/L — ABNORMAL LOW (ref 3.5–5.1)
Sodium: 146 mmol/L — ABNORMAL HIGH (ref 135–145)

## 2017-08-05 LAB — CULTURE, BLOOD (ROUTINE X 2)
CULTURE: NO GROWTH
Culture: NO GROWTH
SPECIAL REQUESTS: ADEQUATE
Specimen Description: ADEQUATE

## 2017-08-05 LAB — CBC
HCT: 36.8 % (ref 36.0–46.0)
HEMOGLOBIN: 10.9 g/dL — AB (ref 12.0–15.0)
MCH: 28.1 pg (ref 26.0–34.0)
MCHC: 29.6 g/dL — ABNORMAL LOW (ref 30.0–36.0)
MCV: 94.8 fL (ref 78.0–100.0)
Platelets: 139 10*3/uL — ABNORMAL LOW (ref 150–400)
RBC: 3.88 MIL/uL (ref 3.87–5.11)
RDW: 16.6 % — ABNORMAL HIGH (ref 11.5–15.5)
WBC: 11.9 10*3/uL — AB (ref 4.0–10.5)

## 2017-08-05 LAB — PROTIME-INR
INR: 2.62
PROTHROMBIN TIME: 27.8 s — AB (ref 11.4–15.2)

## 2017-08-05 LAB — C DIFFICILE QUICK SCREEN W PCR REFLEX
C DIFFICILE (CDIFF) INTERP: NOT DETECTED
C DIFFICLE (CDIFF) ANTIGEN: NEGATIVE
C Diff toxin: NEGATIVE

## 2017-08-05 LAB — MAGNESIUM: MAGNESIUM: 1.6 mg/dL — AB (ref 1.7–2.4)

## 2017-08-05 MED ORDER — MAGNESIUM SULFATE 2 GM/50ML IV SOLN
2.0000 g | Freq: Once | INTRAVENOUS | Status: AC
  Administered 2017-08-05: 2 g via INTRAVENOUS
  Filled 2017-08-05: qty 50

## 2017-08-05 MED ORDER — WARFARIN SODIUM 2 MG PO TABS
2.0000 mg | ORAL_TABLET | Freq: Once | ORAL | Status: AC
  Administered 2017-08-05: 2 mg via ORAL
  Filled 2017-08-05: qty 1

## 2017-08-05 MED ORDER — CARVEDILOL 12.5 MG PO TABS
12.5000 mg | ORAL_TABLET | Freq: Two times a day (BID) | ORAL | Status: DC
  Administered 2017-08-05 – 2017-08-13 (×14): 12.5 mg via ORAL
  Filled 2017-08-05 (×14): qty 1

## 2017-08-05 MED ORDER — RESOURCE THICKENUP CLEAR PO POWD
ORAL | Status: DC | PRN
  Filled 2017-08-05 (×2): qty 125

## 2017-08-05 MED ORDER — IPRATROPIUM BROMIDE 0.02 % IN SOLN
0.5000 mg | Freq: Three times a day (TID) | RESPIRATORY_TRACT | Status: DC
  Administered 2017-08-05 – 2017-08-08 (×12): 0.5 mg via RESPIRATORY_TRACT
  Filled 2017-08-05 (×12): qty 2.5

## 2017-08-05 MED ORDER — LEVALBUTEROL HCL 1.25 MG/0.5ML IN NEBU
1.2500 mg | INHALATION_SOLUTION | Freq: Three times a day (TID) | RESPIRATORY_TRACT | Status: DC
  Administered 2017-08-05 – 2017-08-08 (×12): 1.25 mg via RESPIRATORY_TRACT
  Filled 2017-08-05 (×12): qty 0.5

## 2017-08-05 MED ORDER — POTASSIUM CHLORIDE 10 MEQ/100ML IV SOLN
10.0000 meq | INTRAVENOUS | Status: AC
  Administered 2017-08-05 (×6): 10 meq via INTRAVENOUS
  Filled 2017-08-05 (×5): qty 100

## 2017-08-05 NOTE — Care Management Note (Signed)
Case Management Note  Patient Details  Name: [name] MRN: [id number] Date of Birth: 05/07/1929  If discussed at Long Length of Stay Meetings, dates discussed:  08/05/2017  Additional Comments:  Malcolm Metro, RN 08/05/2017, 1:17 PM

## 2017-08-05 NOTE — Progress Notes (Signed)
Subjective: She says she feels okay.  No new complaints.  Speech therapy evaluation noted and appreciated  Objective: Vital signs in last 24 hours: Temp:  [97.4 F (36.3 C)-99.6 F (37.6 C)] 99.6 F (37.6 C) (12/26 1932) Pulse Rate:  [30-161] 77 (12/27 0700) Resp:  [11-29] 14 (12/27 0700) BP: (120-170)/(58-123) 139/84 (12/27 0700) SpO2:  [88 %-100 %] 91 % (12/27 0700) Weight change:  Last BM Date: 08/04/17  Intake/Output from previous day: 12/26 0701 - 12/27 0700 In: 200 [IV Piggyback:200] Out: -   PHYSICAL EXAM General appearance: alert, cooperative and no distress Resp: rales bilaterally and rhonchi bilaterally Cardio: irregularly irregular rhythm GI: soft, non-tender; bowel sounds normal; no masses,  no organomegaly Extremities: extremities normal, atraumatic, no cyanosis or edema Mildly confused  Lab Results:  Results for orders placed or performed during the hospital encounter of 08/06/2017 (from the past 48 hour(s))  Protime-INR     Status: Abnormal   Collection Time: 08/04/17  5:20 AM  Result Value Ref Range   Prothrombin Time 39.6 (H) 11.4 - 15.2 seconds   INR 6.97   Basic metabolic panel     Status: Abnormal   Collection Time: 08/04/17  5:20 AM  Result Value Ref Range   Sodium 149 (H) 135 - 145 mmol/L   Potassium 2.9 (L) 3.5 - 5.1 mmol/L    Comment: DELTA CHECK NOTED   Chloride 106 101 - 111 mmol/L   CO2 27 22 - 32 mmol/L   Glucose, Bld 118 (H) 65 - 99 mg/dL   BUN 37 (H) 6 - 20 mg/dL   Creatinine, Ser 1.30 (H) 0.44 - 1.00 mg/dL   Calcium 8.8 (L) 8.9 - 10.3 mg/dL   GFR calc non Af Amer 36 (L) >60 mL/min   GFR calc Af Amer 42 (L) >60 mL/min    Comment: (NOTE) The eGFR has been calculated using the CKD EPI equation. This calculation has not been validated in all clinical situations. eGFR's persistently <60 mL/min signify possible Chronic Kidney Disease.    Anion gap 16 (H) 5 - 15  Protime-INR     Status: Abnormal   Collection Time: 08/05/17  4:16 AM   Result Value Ref Range   Prothrombin Time 27.8 (H) 11.4 - 15.2 seconds   INR 9.48   Basic metabolic panel     Status: Abnormal   Collection Time: 08/05/17  4:16 AM  Result Value Ref Range   Sodium 146 (H) 135 - 145 mmol/L   Potassium 3.0 (L) 3.5 - 5.1 mmol/L   Chloride 102 101 - 111 mmol/L   CO2 30 22 - 32 mmol/L   Glucose, Bld 151 (H) 65 - 99 mg/dL   BUN 32 (H) 6 - 20 mg/dL   Creatinine, Ser 1.12 (H) 0.44 - 1.00 mg/dL   Calcium 8.8 (L) 8.9 - 10.3 mg/dL   GFR calc non Af Amer 43 (L) >60 mL/min   GFR calc Af Amer 50 (L) >60 mL/min    Comment: (NOTE) The eGFR has been calculated using the CKD EPI equation. This calculation has not been validated in all clinical situations. eGFR's persistently <60 mL/min signify possible Chronic Kidney Disease.    Anion gap 14 5 - 15  CBC     Status: Abnormal   Collection Time: 08/05/17  4:16 AM  Result Value Ref Range   WBC 11.9 (H) 4.0 - 10.5 K/uL   RBC 3.88 3.87 - 5.11 MIL/uL   Hemoglobin 10.9 (L) 12.0 - 15.0 g/dL  HCT 36.8 36.0 - 46.0 %   MCV 94.8 78.0 - 100.0 fL   MCH 28.1 26.0 - 34.0 pg   MCHC 29.6 (L) 30.0 - 36.0 g/dL   RDW 16.6 (H) 11.5 - 15.5 %   Platelets 139 (L) 150 - 400 K/uL    ABGS No results for input(s): PHART, PO2ART, TCO2, HCO3 in the last 72 hours.  Invalid input(s): PCO2 CULTURES Recent Results (from the past 240 hour(s))  Urine Culture     Status: Abnormal   Collection Time: 08/06/2017  9:26 AM  Result Value Ref Range Status   Specimen Description URINE, CATHETERIZED  Final   Special Requests NONE  Final   Culture >=100,000 COLONIES/mL SERRATIA MARCESCENS (A)  Final   Report Status 08/02/2017 FINAL  Final   Organism ID, Bacteria SERRATIA MARCESCENS (A)  Final      Susceptibility   Serratia marcescens - MIC*    CEFAZOLIN >=64 RESISTANT Resistant     CEFTRIAXONE <=1 SENSITIVE Sensitive     CIPROFLOXACIN <=0.25 SENSITIVE Sensitive     GENTAMICIN <=1 SENSITIVE Sensitive     NITROFURANTOIN 128 RESISTANT Resistant      TRIMETH/SULFA <=20 SENSITIVE Sensitive     * >=100,000 COLONIES/mL SERRATIA MARCESCENS  Blood Culture (routine x 2)     Status: None (Preliminary result)   Collection Time: 08/06/2017  9:34 AM  Result Value Ref Range Status   Specimen Description   Final    BLOOD RIGHT ANTECUBITAL Blood Culture adequate volume   Special Requests BOTTLES DRAWN AEROBIC AND ANAEROBIC  Final   Culture NO GROWTH 4 DAYS  Final   Report Status PENDING  Incomplete  Blood Culture (routine x 2)     Status: None (Preliminary result)   Collection Time: 08/04/2017  9:45 AM  Result Value Ref Range Status   Specimen Description   Final    BLOOD BLOOD LEFT ARM IV BOTTLES DRAWN AEROBIC AND ANAEROBIC   Special Requests Blood Culture adequate volume  Final   Culture NO GROWTH 4 DAYS  Final   Report Status PENDING  Incomplete  MRSA PCR Screening     Status: None   Collection Time: 07/24/2017  2:03 PM  Result Value Ref Range Status   MRSA by PCR NEGATIVE NEGATIVE Final    Comment:        The GeneXpert MRSA Assay (FDA approved for NASAL specimens only), is one component of a comprehensive MRSA colonization surveillance program. It is not intended to diagnose MRSA infection nor to guide or monitor treatment for MRSA infections.    Studies/Results: No results found.  Medications:  Prior to Admission:  Medications Prior to Admission  Medication Sig Dispense Refill Last Dose  . ALPRAZolam (XANAX) 0.5 MG tablet Take 0.5 mg by mouth at bedtime as needed for anxiety or sleep.   07/30/2017 at 1930  . Biotin 5000 MCG TABS Take by mouth daily. 10,000 mcg daily   07/30/2017 at Unknown time  . Calcium Carbonate-Vitamin D (CALTRATE 600+D PO) Take 1 tablet by mouth 2 (two) times daily.    07/30/2017 at Unknown time  . carvedilol (COREG) 12.5 MG tablet Take 6.25-12.5 mg by mouth 2 (two) times daily with a meal. Take 12.5 mg in the morning and 6.25 mg in the evening.   07/30/2017 at 0800  . Cyanocobalamin (VITAMIN B-12) 5000  MCG TBDP Take 1 tablet by mouth daily.   07/30/2017 at Unknown time  . diltiazem (CARDIZEM) 90 MG tablet Take 1 tablet (90  mg total) by mouth every evening.   07/19/2017 at 0800  . donepezil (ARICEPT) 10 MG tablet Take 1 tablet by mouth at bedtime.  6 07/30/2017 at Unknown time  . feeding supplement, GLUCERNA SHAKE, (GLUCERNA SHAKE) LIQD Take 237 mLs by mouth 3 (three) times daily between meals.   07/30/2017 at Unknown time  . furosemide (LASIX) 20 MG tablet Take 1 tablet (20 mg total) by mouth daily.   07/30/2017 at 0800  . HYDROcodone-acetaminophen (NORCO/VICODIN) 5-325 MG tablet Take 1 tablet by mouth every 8 (eight) hours as needed for pain.  0 07/30/2017 at Unknown time  . mirtazapine (REMERON) 15 MG tablet Take 1 tablet by mouth at bedtime.  5 07/30/2017 at Unknown time  . Multiple Vitamin (MULTIVITAMIN) tablet Take 1 tablet by mouth daily.    07/30/2017 at Unknown time  . NAMZARIC 28-10 MG CP24 take one capsule by mouth once daily  12 07/30/2017 at Unknown time  . omeprazole (PRILOSEC) 20 MG capsule Take 20 mg by mouth daily.   07/30/2017 at Unknown time  . potassium chloride (K-DUR) 10 MEQ tablet Take 1 tablet (10 mEq total) by mouth daily. 30 tablet 0 07/30/2017 at Unknown time  . warfarin (COUMADIN) 2 MG tablet Take 2 mg by mouth daily at 6 PM.   07/30/2017 at 1930   Scheduled: . Chlorhexidine Gluconate Cloth  6 each Topical Daily  . donepezil  10 mg Oral QHS   And  . memantine  28 mg Oral Daily  . feeding supplement (GLUCERNA SHAKE)  237 mL Oral TID BM  . furosemide  40 mg Intravenous Daily  . ipratropium  0.5 mg Nebulization TID  . levalbuterol  1.25 mg Nebulization TID  . mouth rinse  15 mL Mouth Rinse BID  . metoprolol tartrate  5 mg Intravenous Q6H  . multivitamin with minerals  1 tablet Oral Daily  . pantoprazole  40 mg Oral Daily  . sodium chloride flush  10-40 mL Intracatheter Q12H  . warfarin  0.5 mg Oral Once  . Warfarin - Pharmacist Dosing Inpatient   Does not apply  Q24H   Continuous: . diltiazem (CARDIZEM) infusion 5 mg/hr (08/05/17 0710)  . piperacillin-tazobactam (ZOSYN)  IV 3.375 g (08/05/17 0534)   ZJI:[id number] **OR** acetaminophen, haloperidol lactate, ondansetron **OR** ondansetron (ZOFRAN) IV, sodium chloride flush  Assesment: She was admitted with acute on chronic hypoxic respiratory failure, community-acquired pneumonia.  She seems to be slowly improving.  There is concerned that her pneumonia is related to aspiration.  She has chronic diastolic heart failure which is being treated.  She was septic on admission but that seems to have resolved.  She has atrial fib and had developed atrial fib with rapid ventricular response but that is better on IV diltiazem.  Her oxygenation is improving.  At baseline she has dementia and is not very mobile at home. Active Problems:   Acute renal failure superimposed on stage 3 chronic kidney disease (HCC)   Sepsis (HCC)   Sepsis due to undetermined organism (HCC)   Chronic diastolic CHF (congestive heart failure) (HCC)   Acute on chronic respiratory failure with hypoxia (HCC)   Acute metabolic encephalopathy   Lobar pneumonia (HCC)   Permanent atrial fibrillation ([place])   Dementia without behavioral disturbance    Plan: Continue current treatments.    LOS: 5 days   Preet Mangano L 08/05/2017, 7:46 AM

## 2017-08-05 NOTE — Progress Notes (Signed)
Modified Barium Swallow Progress Note  Patient Details  Name: [name] MRN: [id number] Date of Birth: 1929/03/19  Today's Date: 08/05/2017  Modified Barium Swallow completed.  Full report located under Chart Review in the Imaging Section.  Brief recommendations include the following:  Clinical Impression  Pt presented with barium tinged thin, NTL, HTL, puree, mech soft, and barium tablet for MBSS. Pt presents with mild oral phase, mild/mod pharyngeal phase, and suspected primary esophageal phase dysphagia characterized by decreased oral bolus cohesiveness, premature spillage (greater with thin liquids), delay in swallow initiation with swallow trigger at the level of the pyriforms with thins, mildly reduced epiglottic deflection, prominent CP, with noted air distention below the UES resulting in premature spillage of bolus, variable penetration of thins and nectars before and during the swallow, sensed and silent aspiration of cup sips thin liquids, mild vallecular residue post swallow with liquids, and delayed emptying of bolus in cervical esophagus with liquids and solids, however the barium tablet traversed to stomach without delay. Pt's delay in swallow initiation along with decreased bolus cohesiveness significantly increase risk for aspiration with thin liquids. Although, Pt did not immediately sense aspiration of thin liquids, once the Pt was cued to cough, Pt appeared to then reflexively cough but was unable to fully removed aspirated material. Pt with decreased clearance of barium in esophagus by primary peristaltic waves and did demonstrate some retrograde movement of bolus noted to expel to pyriforms (during belching?). Recommend D2/fine chop and NTL via cup sips, no straws. Medication should be presented via whole in puree or crushed. Pt had difficulty holding herself upright during assessment and may benefit from PT consult if MD feels it is appropriate. Recommend f/u SLP for  dysphagia at SNF or [facility] depending on discharge recommendations. SLP will follow during acute stay. This assessment and recommendations were reviewed with spouse and Pt.     Swallow Evaluation Recommendations       SLP Diet Recommendations: Dysphagia 2 (Fine chop) solids;Nectar thick liquid   Liquid Administration via: Cup;No straw   Medication Administration: Whole meds with puree   Supervision: Patient able to self feed;Full supervision/cueing for compensatory strategies   Compensations: Slow rate;Small sips/bites;Multiple dry swallows after each bite/sip   Postural Changes: Remain semi-upright after after feeds/meals (Comment);Seated upright at 90 degrees   Oral Care Recommendations: Oral care BID;Staff/trained caregiver to provide oral care   Other Recommendations: Order thickener from pharmacy;Clarify dietary restrictions   Thank you,  [name], CCC-SLP [phone]  Ferdinand Revoir 08/05/2017,2:43 PM

## 2017-08-05 NOTE — Progress Notes (Signed)
PROGRESS NOTE    [name]  XLK:[id number] DOB: 05/16/1929 DOA: 07/23/2017 PCP: Ignatius Specking, MD    Brief Narrative:  83 year old female with a history of chronic respiratory failure, dementia, atrial fibrillation and diastolic heart failure, presents to the hospital with generalized weakness and cough.  Found to have possible aspiration pneumonia and admitted to the hospital for further treatments.  Patient developed rapid atrial fibrillation and was started on Cardizem infusion.  She is currently on intravenous antibiotics and IV fluids.   Assessment & Plan:   Active Problems:   Acute renal failure superimposed on stage 3 chronic kidney disease (HCC)   Sepsis (HCC)   Sepsis due to undetermined organism (HCC)   Chronic diastolic CHF (congestive heart failure) (HCC)   Acute on chronic respiratory failure with hypoxia (HCC)   Acute metabolic encephalopathy   Lobar pneumonia (HCC)   Permanent atrial fibrillation (HCC)   Dementia without behavioral disturbance   1. Sepsis.  Secondary to pneumonia.  Lactic acid 1.5 on admission, trended down with IV fluids.  Influenza panel negative.  Continue intravenous antibiotics. 2. Aspiration pneumonia. Speech therapy has been consulted.  Strep pneumo antigen negative.  Continue on Zosyn for now. 3. Dysphagia.  Staff has noted significant dysphagia with any p.o. intake.  Speech therapy evaluated patient and she is currently on puree diet .  She would likely need modified barium swallow, but needs to be clinically stable to do this. 4. Acute on chronic respiratory failure with hypoxia.  Related to pneumonia/pulmonary edema.  Chronically on 2 L.  Required partial rebreather mask.  Currently, she has been weaned down to 7L HFNC. Wean down oxygen as tolerated. 5. Acute on chronic renal failure, chronic kidney disease stage III.  Creatinine on presentation of 1.6.  Creatinine stable.  Continue to monitor in the setting of diuresis.  Trending down  to 1.1 today. Continue to follow 6. Urinary tract infection.  Too numerous to count WBCs on urinalysis.  Urine culture positive for serratia. This should be covered by IV Zosyn. Continue current treatments. 7. Acute metabolic encephalopathy.  Suspect this is related to sepsis.  Mental status is waxing and waning.  Continue to monitor.  She has baseline dementia. 8. Atrial fibrillation with rapid ventricular response.  Currently on intravenous metoprolol as well as intravenous Cardizem infusion to control heart rate.  Since she is tolerating po diet, will restart on coreg with hopes of weaning off cardizem infusion.  She is anticoagulated with Coumadin. 9. Acute on Chronic diastolic CHF.  Chest x-ray shows worsening pulmonary edema.  Patient has elevated BNP.  Likely precipitated by IV hydration for pneumonia.  Continue on IV Lasix. Overall volume status improving. 10. Hypokalemia. Related to diuretics. Replace. 11. Goals of care. Discussed with son and husband. They wish to continue with current treatment plans. Full code.   DVT prophylaxis: Coumadin Code Status: Full code Family Communication: Discussed with son and husband at the bedside Disposition Plan: Return home on discharge, pending hospital course   Consultants:   Pulmonology  Speech therapy  Procedures:     Antimicrobials:   Zosyn 12/22 >  Azithromycin 12/23 >12/26   Subjective: Denies any shortness of breath. Tolerating puree diet. No chest pain  Objective: Vitals:   08/05/17 0630 08/05/17 0700 08/05/17 0752 08/05/17 0850  BP: (!) 155/98 139/84    Pulse: 96 77 88   Resp: 17 14 16    Temp:   97.6 F (36.4 C)   TempSrc:  Oral   SpO2: (!) 88% 91% 92% 94%  Weight:      Height:        Intake/Output Summary (Last 24 hours) at 08/05/2017 0919 Last data filed at 08/05/2017 0534 Gross per 24 hour  Intake 200 ml  Output -  Net 200 ml   Filed Weights   08/03/2017 0919 08/01/17 0530 08/03/17 0300  Weight: 52.2  kg (115 lb) 56.6 kg (124 lb 12.5 oz) 58 kg (127 lb 13.9 oz)    Examination:  General exam: Appears calm and comfortable  Respiratory system: Bilateral crackles, mostly in upper airways.  normal respiratory effort. Cardiovascular system: S1 & S2 heard, irregular. No JVD, murmurs, rubs, gallops or clicks. No pedal edema. Gastrointestinal system: Abdomen is nondistended, soft and nontender. No organomegaly or masses felt. Normal bowel sounds heard. Central nervous system: No focal neurological deficits. Extremities: Symmetric 5 x 5 power. Skin: No rashes, lesions or ulcers Psychiatry: awake, pleasant    Data Reviewed: I have personally reviewed following labs and imaging studies  CBC: Recent Labs  Lab 08/01/2017 0927 08/01/17 0409 08/02/17 0632 08/03/17 0500 08/05/17 0416  WBC 16.3* 10.8* 11.1* 10.8* 11.9*  NEUTROABS 14.7*  --   --   --   --   HGB 12.3 11.3* 11.2* 10.4* 10.9*  HCT 40.3 38.8 38.0 35.3* 36.8  MCV 93.9 96.5 97.9 96.4 94.8  PLT 199 154 161 135* 139*   Basic Metabolic Panel: Recent Labs  Lab 08/01/17 0409 08/02/17 0632 08/03/17 0500 08/04/17 0520 08/05/17 0416  NA 143 143 144 149* 146*  K 4.3 4.2 3.7 2.9* 3.0*  CL 106 107 108 106 102  CO2 26 23 24 27 30   GLUCOSE 115* 127* 131* 118* 151*  BUN 38* 37* 43* 37* 32*  CREATININE 1.36* 1.50* 1.41* 1.30* 1.12*  CALCIUM 8.7* 8.7* 8.5* 8.8* 8.8*  MG 1.9  --   --   --   --    GFR: Estimated Creatinine Clearance: 33 mL/min (A) (by C-G formula based on SCr of 1.12 mg/dL (H)). Liver Function Tests: Recent Labs  Lab 07/14/2017 0927  AST 51*  ALT 27  ALKPHOS 90  BILITOT 1.4*  PROT 6.6  ALBUMIN 3.4*   No results for input(s): LIPASE, AMYLASE in the last 168 hours. Recent Labs  Lab 08/01/17 1348  AMMONIA 36*   Coagulation Profile: Recent Labs  Lab 08/01/17 0409 08/02/17 0632 08/03/17 0500 08/04/17 0520 08/05/17 0416  INR 2.88 3.17 3.90 3.99 2.62   Cardiac Enzymes: Recent Labs  Lab 07/17/2017 0927    TROPONINI <0.03   BNP (last 3 results) No results for input(s): PROBNP in the last 8760 hours. HbA1C: No results for input(s): HGBA1C in the last 72 hours. CBG: No results for input(s): GLUCAP in the last 168 hours. Lipid Profile: No results for input(s): CHOL, HDL, LDLCALC, TRIG, CHOLHDL, LDLDIRECT in the last 72 hours. Thyroid Function Tests: No results for input(s): TSH, T4TOTAL, FREET4, T3FREE, THYROIDAB in the last 72 hours. Anemia Panel: No results for input(s): VITAMINB12, FOLATE, FERRITIN, TIBC, IRON, RETICCTPCT in the last 72 hours. Sepsis Labs: Recent Labs  Lab 07/23/2017 0936 07/22/2017 1615  PROCALCITON  --  2.44  LATICACIDVEN 1.54 0.8    Recent Results (from the past 240 hour(s))  Urine Culture     Status: Abnormal   Collection Time: 08/05/2017  9:26 AM  Result Value Ref Range Status   Specimen Description URINE, CATHETERIZED  Final   Special Requests NONE  Final  Culture >=100,000 COLONIES/mL SERRATIA MARCESCENS (A)  Final   Report Status 08/02/2017 FINAL  Final   Organism ID, Bacteria SERRATIA MARCESCENS (A)  Final      Susceptibility   Serratia marcescens - MIC*    CEFAZOLIN >=64 RESISTANT Resistant     CEFTRIAXONE <=1 SENSITIVE Sensitive     CIPROFLOXACIN <=0.25 SENSITIVE Sensitive     GENTAMICIN <=1 SENSITIVE Sensitive     NITROFURANTOIN 128 RESISTANT Resistant     TRIMETH/SULFA <=20 SENSITIVE Sensitive     * >=100,000 COLONIES/mL SERRATIA MARCESCENS  Blood Culture (routine x 2)     Status: None   Collection Time: 08/03/2017  9:34 AM  Result Value Ref Range Status   Specimen Description   Final    BLOOD RIGHT ANTECUBITAL Blood Culture adequate volume   Special Requests BOTTLES DRAWN AEROBIC AND ANAEROBIC  Final   Culture NO GROWTH 5 DAYS  Final   Report Status 08/05/2017 FINAL  Final  Blood Culture (routine x 2)     Status: None   Collection Time: 07/30/2017  9:45 AM  Result Value Ref Range Status   Specimen Description   Final    BLOOD BLOOD LEFT ARM  IV BOTTLES DRAWN AEROBIC AND ANAEROBIC   Special Requests Blood Culture adequate volume  Final   Culture NO GROWTH 5 DAYS  Final   Report Status 08/05/2017 FINAL  Final  MRSA PCR Screening     Status: None   Collection Time: 07/30/2017  2:03 PM  Result Value Ref Range Status   MRSA by PCR NEGATIVE NEGATIVE Final    Comment:        The GeneXpert MRSA Assay (FDA approved for NASAL specimens only), is one component of a comprehensive MRSA colonization surveillance program. It is not intended to diagnose MRSA infection nor to guide or monitor treatment for MRSA infections.          Radiology Studies: No results found.      Scheduled Meds: . carvedilol  12.5 mg Oral BID WC  . Chlorhexidine Gluconate Cloth  6 each Topical Daily  . donepezil  10 mg Oral QHS   And  . memantine  28 mg Oral Daily  . feeding supplement (GLUCERNA SHAKE)  237 mL Oral TID BM  . furosemide  40 mg Intravenous Daily  . ipratropium  0.5 mg Nebulization TID  . levalbuterol  1.25 mg Nebulization TID  . mouth rinse  15 mL Mouth Rinse BID  . multivitamin with minerals  1 tablet Oral Daily  . pantoprazole  40 mg Oral Daily  . sodium chloride flush  10-40 mL Intracatheter Q12H  . warfarin  0.5 mg Oral Once  . Warfarin - Pharmacist Dosing Inpatient   Does not apply Q24H   Continuous Infusions: . diltiazem (CARDIZEM) infusion 5 mg/hr (08/05/17 0710)  . piperacillin-tazobactam (ZOSYN)  IV 3.375 g (08/05/17 0534)  . potassium chloride       LOS: 5 days    Time spent:    Erick Blinks, MD [facility] Pager [phone]  If 7PM-7AM, please contact night-coverage [email or web address] Password [facility] 08/05/2017, 9:19 AM

## 2017-08-05 NOTE — Progress Notes (Signed)
ANTICOAGULATION CONSULT NOTE   Pharmacy Consult for coumadin Indication: atrial fibrillation  No Known Allergies  Patient Measurements: Height: 5\' 6"  (167.6 cm) Weight: 127 lb 13.9 oz (58 kg) IBW/kg (Calculated) : 59.3   Vital Signs: Temp: 97.6 F (36.4 C) (12/27 0752) Temp Source: Oral (12/27 0752) BP: 139/84 (12/27 0700) Pulse Rate: 88 (12/27 0752)  Labs: Recent Labs    08/03/17 0500 08/04/17 0520 08/05/17 0416  HGB 10.4*  --  10.9*  HCT 35.3*  --  36.8  PLT 135*  --  139*  LABPROT 37.9* 39.6* 27.8*  INR 3.90 3.99 2.62  CREATININE 1.41* 1.30* 1.12*   Estimated Creatinine Clearance: 33 mL/min (A) (by C-G formula based on SCr of 1.12 mg/dL (H)).  Medications:  Was on coumadin 2mg  po daily PTA  Assessment: 83 yo lady to continue coumadin for afib. Admission INR was therapeutic. No bleeding noted.  Will likely need lower doses than home dose while on antibiotics and poor appetite.  Warfarin dose not given on 12/23 (unable to swallow PO). Last dose given was 12/22.   Pt has not received any Warfarin since 12/22.  INR today is therapeutic.   No bleeding reported.   Goal of Therapy:  INR 2-3 Monitor platelets by anticoagulation protocol: Yes   Plan:  Coumadin 2mg  today x 1 Daily PT/INR Monitor for bleeding complications  [name], PharmD Clinical Pharmacist Pager:  [phone] 08/05/2017   08/05/2017,9:26 AM

## 2017-08-06 LAB — PROTIME-INR
INR: 2.02
PROTHROMBIN TIME: 22.7 s — AB (ref 11.4–15.2)

## 2017-08-06 LAB — BASIC METABOLIC PANEL
ANION GAP: 10 (ref 5–15)
BUN: 33 mg/dL — ABNORMAL HIGH (ref 6–20)
CALCIUM: 8.9 mg/dL (ref 8.9–10.3)
CO2: 32 mmol/L (ref 22–32)
Chloride: 101 mmol/L (ref 101–111)
Creatinine, Ser: 1.22 mg/dL — ABNORMAL HIGH (ref 0.44–1.00)
GFR, EST AFRICAN AMERICAN: 45 mL/min — AB (ref >60)
GFR, EST NON AFRICAN AMERICAN: 39 mL/min — AB (ref >60)
GLUCOSE: 136 mg/dL — AB (ref 65–99)
Potassium: 3.8 mmol/L (ref 3.5–5.1)
SODIUM: 143 mmol/L (ref 135–145)

## 2017-08-06 MED ORDER — AMOXICILLIN-POT CLAVULANATE 875-125 MG PO TABS
1.0000 | ORAL_TABLET | Freq: Two times a day (BID) | ORAL | Status: DC
  Administered 2017-08-06 – 2017-08-08 (×5): 1 via ORAL
  Filled 2017-08-06 (×5): qty 1

## 2017-08-06 MED ORDER — FUROSEMIDE 20 MG PO TABS
20.0000 mg | ORAL_TABLET | Freq: Every day | ORAL | Status: DC
  Administered 2017-08-07 – 2017-08-10 (×3): 20 mg via ORAL
  Filled 2017-08-06 (×3): qty 1

## 2017-08-06 MED ORDER — WARFARIN SODIUM 2 MG PO TABS
2.0000 mg | ORAL_TABLET | Freq: Once | ORAL | Status: AC
  Administered 2017-08-06: 2 mg via ORAL
  Filled 2017-08-06: qty 1

## 2017-08-06 MED ORDER — DILTIAZEM HCL 60 MG PO TABS
60.0000 mg | ORAL_TABLET | Freq: Three times a day (TID) | ORAL | Status: DC
  Administered 2017-08-06 – 2017-08-07 (×4): 60 mg via ORAL
  Filled 2017-08-06 (×4): qty 1

## 2017-08-06 NOTE — Plan of Care (Signed)
  Acute Rehab PT Goals(only PT should resolve) Pt Will Go Supine/Side To Sit 08/06/2017 1605 - Progressing by [name], PT Flowsheets Taken 08/06/2017 1605  Pt will go Supine/Side to Sit with supervision Pt Will Go Sit To Supine/Side 08/06/2017 1605 - Progressing by [name], PT Flowsheets Taken 08/06/2017 1605  Pt will go Sit to Supine/Side with supervision Patient Will Transfer Sit To/From Stand 08/06/2017 1605 - Progressing by [name], PT Flowsheets Taken 08/06/2017 1605  Patient will transfer sit to/from stand with min guard assist Pt Will Transfer Bed To Chair/Chair To Bed 08/06/2017 1605 - Progressing by [name], PT Flowsheets Taken 08/06/2017 1605  Pt will Transfer Bed to Chair/Chair to Bed with min assist Pt Will Ambulate 08/06/2017 1605 - Progressing by [name], PT Flowsheets Taken 08/06/2017 1605  Pt will Ambulate 25 feet;with minimal assist;with rolling walker  4:06 PM, 08/06/17 [name], MPT Physical Therapist with [facility] [phone] office [phone] mobile phone

## 2017-08-06 NOTE — Progress Notes (Signed)
PROGRESS NOTE    [name]  ZOX:[id number] DOB: 05/30/1929 DOA: 08/05/2017 PCP: Ignatius Specking, MD    Brief Narrative:  83 year old female with a history of chronic respiratory failure, dementia, atrial fibrillation and diastolic heart failure, presents to the hospital with generalized weakness and cough.  Found to have possible aspiration pneumonia and admitted to the hospital for further treatments.  Patient developed rapid atrial fibrillation and was started on Cardizem infusion.  She is currently on intravenous antibiotics and IV fluids.   Assessment & Plan:   Active Problems:   Acute renal failure superimposed on stage 3 chronic kidney disease (HCC)   Sepsis (HCC)   Sepsis due to undetermined organism (HCC)   Chronic diastolic CHF (congestive heart failure) (HCC)   Acute on chronic respiratory failure with hypoxia (HCC)   Acute metabolic encephalopathy   Lobar pneumonia (HCC)   Permanent atrial fibrillation (HCC)   Dementia without behavioral disturbance   1. Sepsis.  Secondary to pneumonia.  Lactic acid 1.5 on admission, trended down with IV fluids.  Influenza panel negative.  Continue antibiotics. 2. Aspiration pneumonia. Swallow evaluation as below.  Treated with IV zosyn. Since she is clinically improving, will transition to augmentin 3. Dysphagia.  Staff has noted significant dysphagia with any p.o. intake.  Speech therapy evaluated patient and she underwent MBS. This showed delay in swallow initiation and sensed as well as silent aspitiration. She has been put on D2/fine chop and nectar thick liquids.   4. Acute on chronic respiratory failure with hypoxia.  Related to pneumonia/pulmonary edema.  Chronically on 2 L.  Required partial rebreather mask.  Currently, she has been weaned down to 4L HFNC. Wean down oxygen as tolerated. 5. Acute on chronic renal failure, chronic kidney disease stage III.  Creatinine on presentation of 1.6.  Trended down with diuresis. Continue  to monitor in the setting of diuresis.  Currently stable.  6. Urinary tract infection.  Too numerous to count WBCs on urinalysis.  Urine culture positive for serratia. This has been adequately treated with IV Zosyn.  7. Acute metabolic encephalopathy.  Suspect this is related to sepsis.  Mental status is waxing and waning.  Continue to monitor.  She has baseline dementia. 8. Atrial fibrillation with rapid ventricular response.  Initially treated with intravenous metoprolol as well as intravenous Cardizem infusion to control heart rate.  Since she is tolerating po diet, she was started on coreg and now oral diltiazem with improvement of heart rate. She has been weaned off cardizem infusion.  She is anticoagulated with Coumadin. 9. Acute on Chronic diastolic CHF.  Chest x-ray initially showed worsening pulmonary edema.  Patient had elevated BNP.  Likely precipitated by IV hydration for pneumonia.  Treated with IV Lasix. Overall volume status improving. Will transition to oral lasix tomorrow. 10. Hypokalemia. Related to diuretics. Replace. 11. Goals of care. Discussed with son and husband. They wish to continue with current treatment plans. Full code.   DVT prophylaxis: Coumadin Code Status: Full code Family Communication: Discussed with son at the bedside Disposition Plan: Return home on discharge, pending hospital course   Consultants:   Pulmonology  Speech therapy  Procedures:     Antimicrobials:   Zosyn 12/22 >12/28  Azithromycin 12/23 >12/26  augmentin 12/28>   Subjective: Shortness of breath is better. Non productive cough  Objective: Vitals:   08/06/17 0800 08/06/17 0852 08/06/17 0900 08/06/17 1200  BP: (!) 162/102  (!) 148/113   Pulse: (!) 134  (!) 124  Resp: 19  17   Temp: (!) 97.3 F (36.3 C)   (!) 97.5 F (36.4 C)  TempSrc: Oral   Oral  SpO2: 94% 99% 100%   Weight:      Height:        Intake/Output Summary (Last 24 hours) at 08/06/2017 1258 Last data  filed at 08/06/2017 0532 Gross per 24 hour  Intake 150 ml  Output -  Net 150 ml   Filed Weights   August 15, 2017 0919 08/01/17 0530 08/03/17 0300  Weight: 52.2 kg (115 lb) 56.6 kg (124 lb 12.5 oz) 58 kg (127 lb 13.9 oz)    Examination:  General exam: Appears calm and comfortable  Respiratory system: Bilateral crackles, mostly in upper airways.  normal respiratory effort. Cardiovascular system: S1 & S2 heard, irregular. No JVD, murmurs, rubs, gallops or clicks. No pedal edema. Gastrointestinal system: Abdomen is nondistended, soft and nontender. No organomegaly or masses felt. Normal bowel sounds heard. Central nervous system: No focal neurological deficits. Extremities: Symmetric 5 x 5 power. Skin: No rashes, lesions or ulcers Psychiatry: awake, pleasant    Data Reviewed: I have personally reviewed following labs and imaging studies  CBC: Recent Labs  Lab 2017-08-15 0927 08/01/17 0409 08/02/17 0632 08/03/17 0500 08/05/17 0416  WBC 16.3* 10.8* 11.1* 10.8* 11.9*  NEUTROABS 14.7*  --   --   --   --   HGB 12.3 11.3* 11.2* 10.4* 10.9*  HCT 40.3 38.8 38.0 35.3* 36.8  MCV 93.9 96.5 97.9 96.4 94.8  PLT 199 154 161 135* 139*   Basic Metabolic Panel: Recent Labs  Lab 08/01/17 0409 08/02/17 0632 08/03/17 0500 08/04/17 0520 08/05/17 0416 08/06/17 0552  NA 143 143 144 149* 146* 143  K 4.3 4.2 3.7 2.9* 3.0* 3.8  CL 106 107 108 106 102 101  CO2 26 23 24 27 30  32  GLUCOSE 115* 127* 131* 118* 151* 136*  BUN 38* 37* 43* 37* 32* 33*  CREATININE 1.36* 1.50* 1.41* 1.30* 1.12* 1.22*  CALCIUM 8.7* 8.7* 8.5* 8.8* 8.8* 8.9  MG 1.9  --   --   --  1.6*  --    GFR: Estimated Creatinine Clearance: 30.3 mL/min (A) (by C-G formula based on SCr of 1.22 mg/dL (H)). Liver Function Tests: Recent Labs  Lab 08/15/2017 0927  AST 51*  ALT 27  ALKPHOS 90  BILITOT 1.4*  PROT 6.6  ALBUMIN 3.4*   No results for input(s): LIPASE, AMYLASE in the last 168 hours. Recent Labs  Lab 08/01/17 1348    AMMONIA 36*   Coagulation Profile: Recent Labs  Lab 08/02/17 0632 08/03/17 0500 08/04/17 0520 08/05/17 0416 08/06/17 0552  INR 3.17 3.90 3.99 2.62 2.02   Cardiac Enzymes: Recent Labs  Lab 2017/08/15 0927  TROPONINI <0.03   BNP (last 3 results) No results for input(s): PROBNP in the last 8760 hours. HbA1C: No results for input(s): HGBA1C in the last 72 hours. CBG: No results for input(s): GLUCAP in the last 168 hours. Lipid Profile: No results for input(s): CHOL, HDL, LDLCALC, TRIG, CHOLHDL, LDLDIRECT in the last 72 hours. Thyroid Function Tests: No results for input(s): TSH, T4TOTAL, FREET4, T3FREE, THYROIDAB in the last 72 hours. Anemia Panel: No results for input(s): VITAMINB12, FOLATE, FERRITIN, TIBC, IRON, RETICCTPCT in the last 72 hours. Sepsis Labs: Recent Labs  Lab August 15, 2017 0936 08-15-2017 1615  PROCALCITON  --  2.44  LATICACIDVEN 1.54 0.8    Recent Results (from the past 240 hour(s))  Urine Culture  Status: Abnormal   Collection Time: 08/09/2017  9:26 AM  Result Value Ref Range Status   Specimen Description URINE, CATHETERIZED  Final   Special Requests NONE  Final   Culture >=100,000 COLONIES/mL SERRATIA MARCESCENS (A)  Final   Report Status 08/02/2017 FINAL  Final   Organism ID, Bacteria SERRATIA MARCESCENS (A)  Final      Susceptibility   Serratia marcescens - MIC*    CEFAZOLIN >=64 RESISTANT Resistant     CEFTRIAXONE <=1 SENSITIVE Sensitive     CIPROFLOXACIN <=0.25 SENSITIVE Sensitive     GENTAMICIN <=1 SENSITIVE Sensitive     NITROFURANTOIN 128 RESISTANT Resistant     TRIMETH/SULFA <=20 SENSITIVE Sensitive     * >=100,000 COLONIES/mL SERRATIA MARCESCENS  Blood Culture (routine x 2)     Status: None   Collection Time: 07/28/2017  9:34 AM  Result Value Ref Range Status   Specimen Description   Final    BLOOD RIGHT ANTECUBITAL Blood Culture adequate volume   Special Requests BOTTLES DRAWN AEROBIC AND ANAEROBIC  Final   Culture NO GROWTH 5 DAYS   Final   Report Status 08/05/2017 FINAL  Final  Blood Culture (routine x 2)     Status: None   Collection Time: 07/14/2017  9:45 AM  Result Value Ref Range Status   Specimen Description   Final    BLOOD BLOOD LEFT ARM IV BOTTLES DRAWN AEROBIC AND ANAEROBIC   Special Requests Blood Culture adequate volume  Final   Culture NO GROWTH 5 DAYS  Final   Report Status 08/05/2017 FINAL  Final  MRSA PCR Screening     Status: None   Collection Time: 08/03/2017  2:03 PM  Result Value Ref Range Status   MRSA by PCR NEGATIVE NEGATIVE Final    Comment:        The GeneXpert MRSA Assay (FDA approved for NASAL specimens only), is one component of a comprehensive MRSA colonization surveillance program. It is not intended to diagnose MRSA infection nor to guide or monitor treatment for MRSA infections.   C difficile quick scan w PCR reflex     Status: None   Collection Time: 08/05/17  5:18 PM  Result Value Ref Range Status   C Diff antigen NEGATIVE NEGATIVE Final   C Diff toxin NEGATIVE NEGATIVE Final   C Diff interpretation No C. difficile detected.  Final         Radiology Studies: Dg Swallowing Func-speech Pathology  Result Date: 08/05/2017 Objective Swallowing Evaluation: Type of Study: MBS-Modified Barium Swallow Study  Patient Details Name: [name] MRN: [id number] Date of Birth: 03/20/1929 Today's Date: 08/05/2017 Time: SLP Start Time (ACUTE ONLY): 1200 -SLP Stop Time (ACUTE ONLY): 1300 SLP Time Calculation (min) (ACUTE ONLY): 60 min Past Medical History: Past Medical History: Diagnosis Date . Chronic atrial fibrillation (HCC)  . Chronic diastolic CHF (congestive heart failure) (HCC)  . CKD (chronic kidney disease), stage III (HCC)  . Coronary artery disease  . Dementia  . Encounter for long-term (current) use of other medications   COUMADIN THERAPY . Essential hypertension  . GERD (gastroesophageal reflux disease)  . Lower extremity weakness  . Pericardial effusion   a. Echo 08/2014:  mod pericardial effusion. . Renal artery stenosis (HCC)   a. right renal artery stenosis (1-59% by duplex in 04/2013). . Sinoatrial node dysfunction (HCC)    SINUS BRADYCARDIA . Sinus bradycardia  Past Surgical History: Past Surgical History: Procedure Laterality Date . BACK SURGERY   . CARDIOVERSION  x2 . CHOLECYSTECTOMY   . COLONOSCOPY  05/2011  Dr. Allena Katz: normal. no bx. Marland Kitchen KNEE ARTHROSCOPY   . PACEMAKER IMPLANT  04/30/2016  SJM Endurity VR PPM implanted by Dr Danella Maiers in [place] HPI: 83 year old female with a history of chronic respiratory failure, dementia, atrial fibrillation and diastolic heart failure, presents to the hospital with generalized weakness and cough.  Found to have possible aspiration pneumonia and admitted to the hospital for further treatments.  Patient developed rapid atrial fibrillation and was started on Cardizem infusion.  She is currently on intravenous antibiotics and IV fluids.  Subjective: "Ok" Assessment / Plan / Recommendation CHL IP CLINICAL IMPRESSIONS 08/05/2017 Clinical Impression Pt presented with barium tinged thin, NTL, HTL, puree, mech soft, and barium tablet for MBSS. Pt presents with mild oral phase, mild/mod pharyngeal phase, and suspected primary esophageal phase dysphagia characterized by decreased oral bolus cohesiveness, premature spillage (greater with thin liquids), delay in swallow initiation with swallow trigger at the level of the pyriforms with thins, mildly reduced epiglottic deflection, prominent CP, with noted air distention below the UES resulting in premature spillage of bolus, variable penetration of thins and nectars before and during the swallow, sensed and silent aspiration of cup sips thin liquids, mild vallecular residue post swallow with liquids, and delayed emptying of bolus in cervical esophagus with liquids and solids, however the barium tablet traversed to stomach without delay. Pt's delay in swallow initiation along with decreased bolus  cohesiveness significantly increase risk for aspiration with thin liquids. Although, Pt did not immediately sense aspiration of thin liquids, once the Pt was cued to cough, Pt appeared to then reflexively cough but was unable to fully removed aspirated material. Pt with decreased clearance of barium in esophagus by primary peristaltic waves and did demonstrate some retrograde movement of bolus noted to expel to pyriforms (during belching?). Recommend D2/fine chop and NTL via cup sips, no straws. Medication should be presented via whole in puree or crushed. Pt had difficulty holding herself upright during assessment and may benefit from PT consult if MD feels it is appropriate. Recommend f/u SLP for dysphagia at SNF or [facility] depending on discharge recommendations. SLP will follow during acute stay. This assessment and recommendations were reviewed with spouse and Pt.   SLP Visit Diagnosis Dysphagia, oropharyngeal phase (R13.12) Attention and concentration deficit following -- Frontal lobe and executive function deficit following -- Impact on safety and function Moderate aspiration risk;Risk for inadequate nutrition/hydration;Severe aspiration risk   CHL IP TREATMENT RECOMMENDATION 08/05/2017 Treatment Recommendations Therapy as outlined in treatment plan below   Prognosis 08/05/2017 Prognosis for Safe Diet Advancement Fair Barriers to Reach Goals Severity of deficits Barriers/Prognosis Comment -- CHL IP DIET RECOMMENDATION 08/05/2017 SLP Diet Recommendations Dysphagia 2 (Fine chop) solids;Nectar thick liquid Liquid Administration via Cup;No straw Medication Administration Whole meds with puree Compensations Slow rate;Small sips/bites;Multiple dry swallows after each bite/sip Postural Changes Remain semi-upright after after feeds/meals (Comment);Seated upright at 90 degrees   CHL IP OTHER RECOMMENDATIONS 08/05/2017 Recommended Consults -- Oral Care Recommendations Oral care BID;Staff/trained caregiver to provide oral  care Other Recommendations Order thickener from pharmacy;Clarify dietary restrictions   CHL IP FOLLOW UP RECOMMENDATIONS 08/05/2017 Follow up Recommendations Home health SLP;Skilled Nursing facility   [facility] IP FREQUENCY AND DURATION 08/05/2017 Speech Therapy Frequency (ACUTE ONLY) min 2x/week Treatment Duration 1 week      CHL IP ORAL PHASE 08/05/2017 Oral Phase Impaired Oral - Pudding Teaspoon -- Oral - Pudding Cup -- Oral - Honey Teaspoon -- Oral -  Honey Cup -- Oral - Nectar Teaspoon -- Oral - Nectar Cup -- Oral - Nectar Straw -- Oral - Thin Teaspoon -- Oral - Thin Cup Lingual/palatal residue;Decreased bolus cohesion;Premature spillage Oral - Thin Straw -- Oral - Puree -- Oral - Mech Soft -- Oral - Regular -- Oral - Multi-Consistency -- Oral - Pill -- Oral Phase - Comment decreased liquid bolus cohesiveness; premature spillate  CHL IP PHARYNGEAL PHASE 08/05/2017 Pharyngeal Phase Impaired Pharyngeal- Pudding Teaspoon -- Pharyngeal -- Pharyngeal- Pudding Cup -- Pharyngeal -- Pharyngeal- Honey Teaspoon -- Pharyngeal -- Pharyngeal- Honey Cup Delayed swallow initiation-vallecula Pharyngeal -- Pharyngeal- Nectar Teaspoon Delayed swallow initiation-vallecula;WFL Pharyngeal -- Pharyngeal- Nectar Cup Delayed swallow initiation-vallecula;Reduced epiglottic inversion;Penetration/Aspiration during swallow;Pharyngeal residue - valleculae Pharyngeal Material does not enter airway;Material enters airway, remains ABOVE vocal cords and not ejected out;Material enters airway, remains ABOVE vocal cords then ejected out Pharyngeal- Nectar Straw Delayed swallow initiation-pyriform sinuses;Penetration/Aspiration before swallow;Reduced epiglottic inversion;Pharyngeal residue - valleculae Pharyngeal Material enters airway, CONTACTS cords and then ejected out;Material enters airway, remains ABOVE vocal cords and not ejected out Pharyngeal- Thin Teaspoon Delayed swallow initiation-pyriform sinuses Pharyngeal -- Pharyngeal- Thin Cup Delayed  swallow initiation-pyriform sinuses;Penetration/Aspiration during swallow;Penetration/Apiration after swallow;Reduced airway/laryngeal closure;Reduced epiglottic inversion;Trace aspiration;Pharyngeal residue - valleculae Pharyngeal Material enters airway, passes BELOW cords without attempt by patient to eject out (silent aspiration);Material enters airway, passes BELOW cords and not ejected out despite cough attempt by patient Pharyngeal- Thin Straw -- Pharyngeal -- Pharyngeal- Puree Delayed swallow initiation-vallecula;WFL Pharyngeal -- Pharyngeal- Mechanical Soft Delayed swallow initiation-vallecula Pharyngeal -- Pharyngeal- Regular -- Pharyngeal -- Pharyngeal- Multi-consistency -- Pharyngeal -- Pharyngeal- Pill WFL Pharyngeal -- Pharyngeal Comment --  CHL IP CERVICAL ESOPHAGEAL PHASE 08/05/2017 Cervical Esophageal Phase Impaired Pudding Teaspoon -- Pudding Cup -- Honey Teaspoon -- Honey Cup -- Nectar Teaspoon -- Nectar Cup -- Nectar Straw -- Thin Teaspoon -- Thin Cup Prominent cricopharyngeal segment;Esophageal backflow into cervical esophagus;Esophageal backflow into the pharynx Thin Straw -- Puree Prominent cricopharyngeal segment;Esophageal backflow into cervical esophagus Mechanical Soft -- Regular -- Multi-consistency -- Pill -- Cervical Esophageal Comment Prominent CP with posterior pharyngeal wall thickening near UES possibly due to osteophyte and kyphosis; air distention noted below UES Thank you, Havery Moros, CCC-SLP [phone] No flowsheet data found. PORTER,DABNEY 08/05/2017, 2:44 PM                   Scheduled Meds: . carvedilol  12.5 mg Oral BID WC  . Chlorhexidine Gluconate Cloth  6 each Topical Daily  . diltiazem  60 mg Oral Q8H  . donepezil  10 mg Oral QHS   And  . memantine  28 mg Oral Daily  . feeding supplement (GLUCERNA SHAKE)  237 mL Oral TID BM  . furosemide  40 mg Intravenous Daily  . ipratropium  0.5 mg Nebulization TID  . levalbuterol  1.25 mg Nebulization TID  .  mouth rinse  15 mL Mouth Rinse BID  . multivitamin with minerals  1 tablet Oral Daily  . pantoprazole  40 mg Oral Daily  . sodium chloride flush  10-40 mL Intracatheter Q12H  . warfarin  2 mg Oral Once  . Warfarin - Pharmacist Dosing Inpatient   Does not apply Q24H   Continuous Infusions: . diltiazem (CARDIZEM) infusion Stopped (08/05/17 1614)  . piperacillin-tazobactam (ZOSYN)  IV Stopped (08/06/17 0932)     LOS: 6 days    Time spent:    Erick Blinks, MD [facility] Pager [phone]  If 7PM-7AM, please contact night-coverage [email or web address] Password [facility] 08/06/2017,  12:58 PM

## 2017-08-06 NOTE — Evaluation (Signed)
Physical Therapy Evaluation Patient Details Name: [name] MRN: [id number] DOB: 12-Sep-1928 Today's Date: 08/06/2017   History of Present Illness  [name] is a 83 y.o. female with medical history of chronic respiratory failure on 2 L, dementia, symptomatic bradycardia status post PPM, permanent atrial fibrillation, diastolic CHF presents with 3-4-day history of cough and generalized weakness.  On the morning of admission, the patient was significantly weaker and more somnolent requiring maximal assistance to transfer from her bed.  As a result, EMS was activated.  The patient is unable to provide any history secondary to her encephalopathy.  History is obtained from speaking with the patient's family and reviewing the medical record.  The patient's husband stated that she had vomited earlier in the morning without any blood.  She has not had any diarrhea, hematochezia, melena.   there has not been any complaints of chest pain, headache, neck pain, dysuria, hematuria.    Clinical Impression  Patient demonstrates slow labored movement for sitting up, transfers and taking steps with RW with PWB on LUE due to recent fracture 2 months ago, had been wearing sling per family until admitted to hospital.  They were planning follow up visit for shoulder, but patient had to be admitted after last appointment with PCP.  Patient tolerated sitting up in chair after therapy with family members present at bedside - RN notified.  Patient will benefit from continued physical therapy in hospital and recommended venue below to increase strength, balance, endurance for safe ADLs and gait.    Follow Up Recommendations SNF;Supervision/Assistance - 24 hour    Equipment Recommendations  None recommended by PT    Recommendations for Other Services       Precautions / Restrictions Precautions Precautions: Fall Restrictions Weight Bearing Restrictions: No      Mobility  Bed Mobility Overal bed  mobility: Needs Assistance Bed Mobility: Supine to Sit     Supine to sit: Mod assist     General bed mobility comments: demonstrates slow labored movement with verbal cues to use BUE to help scoot up with fair carryover  Transfers Overall transfer level: Needs assistance Equipment used: Rolling walker (2 wheeled) Transfers: Sit to/from [employer] Sit to Stand: Mod assist Stand pivot transfers: Mod assist       General transfer comment: slow labored movement  Ambulation/Gait Ambulation/Gait assistance: Mod assist;Max assist Ambulation Distance (Feet): 3 Feet Assistive device: Rolling walker (2 wheeled) Gait Pattern/deviations: Decreased step length - right;Decreased step length - left;Decreased stride length   Gait velocity interpretation: Below normal speed for age/gender General Gait Details: limited to a few unsteady steps using RW with PWB on LUE  Stairs            Wheelchair Mobility    Modified Rankin (Stroke Patients Only)       Balance Overall balance assessment: Needs assistance Sitting-balance support: Bilateral upper extremity supported;Feet supported Sitting balance-Leahy Scale: Fair     Standing balance support: Bilateral upper extremity supported;During functional activity Standing balance-Leahy Scale: Fair Standing balance comment: fair/poor with PWB on LUE                             Pertinent Vitals/Pain Pain Assessment: No/denies pain    Home Living Family/patient expects to be discharged to:: Private residence Living Arrangements: Spouse/significant other Available Help at Discharge: Family Type of Home: House Home Access: Stairs to enter Entrance Stairs-Rails: None Entrance Stairs-Number of Steps:  1 Home Layout: Two level;Able to live on main level with bedroom/bathroom Home Equipment: Walker - 4 wheels;Wheelchair - manual;Shower seat;Bedside commode      Prior Function Level of Independence: Needs  assistance   Gait / Transfers Assistance Needed: limited ambulation since fall with LUE humeral fracture per family, has been using wheelchair mostly for last 2 months since LUE fracture  ADL's / Homemaking Assistance Needed: assisted by family        Hand Dominance        Extremity/Trunk Assessment   Upper Extremity Assessment Upper Extremity Assessment: Generalized weakness;RUE deficits/detail;LUE deficits/detail RUE Deficits / Details: grossly 4/5 LUE Deficits / Details: grossly -3/5    Lower Extremity Assessment Lower Extremity Assessment: Generalized weakness    Cervical / Trunk Assessment Cervical / Trunk Assessment: Normal  Communication   Communication: No difficulties  Cognition Arousal/Alertness: Awake/alert Behavior During Therapy: WFL for tasks assessed/performed Overall Cognitive Status: Within Functional Limits for tasks assessed                                        General Comments      Exercises     Assessment/Plan    PT Assessment Patient needs continued PT services  PT Problem List Decreased strength;Decreased activity tolerance;Decreased balance;Decreased mobility       PT Treatment Interventions Gait training;Functional mobility training;Therapeutic activities;Therapeutic exercise;Patient/family education    PT Goals (Current goals can be found in the Care Plan section)  Acute Rehab PT Goals Patient Stated Goal: return home PT Goal Formulation: With patient/family Time For Goal Achievement: 08/12/2017 Potential to Achieve Goals: Good    Frequency Min 3X/week   Barriers to discharge        Co-evaluation               AM-PAC PT "6 Clicks" Daily Activity  Outcome Measure Difficulty turning over in bed (including adjusting bedclothes, sheets and blankets)?: A Little Difficulty moving from lying on back to sitting on the side of the bed? : A Lot Difficulty sitting down on and standing up from a chair with arms  (e.g., wheelchair, bedside commode, etc,.)?: A Lot Help needed moving to and from a bed to chair (including a wheelchair)?: A Lot Help needed walking in hospital room?: A Lot Help needed climbing 3-5 steps with a railing? : Total 6 Click Score: 12    End of Session   Activity Tolerance: Patient tolerated treatment well;Patient limited by fatigue Patient left: in chair;with call bell/phone within reach;with family/visitor present(RN notified patient left in chair) Nurse Communication: Mobility status PT Visit Diagnosis: Unsteadiness on feet (R26.81);Other abnormalities of gait and mobility (R26.89);Muscle weakness (generalized) (M62.81)    Time: [id number] PT Time Calculation (min) (ACUTE ONLY): 27 min   Charges:   PT Evaluation $PT Eval Moderate Complexity: 1 Mod PT Treatments $Therapeutic Activity: 23-37 mins   PT G Codes:        4:04 PM, 08/06/17 Ocie BobJames Hortence Charter, MPT Physical Therapist [phone] office [phone] mobile phone

## 2017-08-06 NOTE — Progress Notes (Signed)
Subjective: She says she feels okay.  Modified barium swallow results noted and she appears to be aspirating without being aware of that.  This is certainly in agreement with clinical appearance.  Objective: Vital signs in last 24 hours: Temp:  [97.5 F (36.4 C)-98.4 F (36.9 C)] 98.1 F (36.7 C) (12/28 0000) Pulse Rate:  [38-125] 104 (12/28 0130) Resp:  [11-25] 15 (12/28 0130) BP: (126-156)/(73-110) 151/90 (12/28 0100) SpO2:  [92 %-100 %] 93 % (12/28 0130) Weight change:  Last BM Date: 08/04/17  Intake/Output from previous day: 12/27 0701 - 12/28 0700 In: 150 [IV Piggyback:150] Out: -   PHYSICAL EXAM General appearance: alert and no distress Resp: rhonchi bilaterally Cardio: irregularly irregular rhythm GI: soft, non-tender; bowel sounds normal; no masses,  no organomegaly Extremities: extremities normal, atraumatic, no cyanosis or edema Skin warm and dry she does not seem to be confused this morning  Lab Results:  Results for orders placed or performed during the hospital encounter of 07/27/2017 (from the past 48 hour(s))  Protime-INR     Status: Abnormal   Collection Time: 08/05/17  4:16 AM  Result Value Ref Range   Prothrombin Time 27.8 (H) 11.4 - 15.2 seconds   INR 7.09   Basic metabolic panel     Status: Abnormal   Collection Time: 08/05/17  4:16 AM  Result Value Ref Range   Sodium 146 (H) 135 - 145 mmol/L   Potassium 3.0 (L) 3.5 - 5.1 mmol/L   Chloride 102 101 - 111 mmol/L   CO2 30 22 - 32 mmol/L   Glucose, Bld 151 (H) 65 - 99 mg/dL   BUN 32 (H) 6 - 20 mg/dL   Creatinine, Ser 1.12 (H) 0.44 - 1.00 mg/dL   Calcium 8.8 (L) 8.9 - 10.3 mg/dL   GFR calc non Af Amer 43 (L) >60 mL/min   GFR calc Af Amer 50 (L) >60 mL/min    Comment: (NOTE) The eGFR has been calculated using the CKD EPI equation. This calculation has not been validated in all clinical situations. eGFR's persistently <60 mL/min signify possible Chronic Kidney Disease.    Anion gap 14 5 - 15  CBC      Status: Abnormal   Collection Time: 08/05/17  4:16 AM  Result Value Ref Range   WBC 11.9 (H) 4.0 - 10.5 K/uL   RBC 3.88 3.87 - 5.11 MIL/uL   Hemoglobin 10.9 (L) 12.0 - 15.0 g/dL   HCT 36.8 36.0 - 46.0 %   MCV 94.8 78.0 - 100.0 fL   MCH 28.1 26.0 - 34.0 pg   MCHC 29.6 (L) 30.0 - 36.0 g/dL   RDW 16.6 (H) 11.5 - 15.5 %   Platelets 139 (L) 150 - 400 K/uL  Magnesium     Status: Abnormal   Collection Time: 08/05/17  4:16 AM  Result Value Ref Range   Magnesium 1.6 (L) 1.7 - 2.4 mg/dL  C difficile quick scan w PCR reflex     Status: None   Collection Time: 08/05/17  5:18 PM  Result Value Ref Range   C Diff antigen NEGATIVE NEGATIVE   C Diff toxin NEGATIVE NEGATIVE   C Diff interpretation No C. difficile detected.   Protime-INR     Status: Abnormal   Collection Time: 08/06/17  5:52 AM  Result Value Ref Range   Prothrombin Time 22.7 (H) 11.4 - 15.2 seconds   INR 6.28   Basic metabolic panel     Status: Abnormal   Collection Time:  08/06/17  5:52 AM  Result Value Ref Range   Sodium 143 135 - 145 mmol/L   Potassium 3.8 3.5 - 5.1 mmol/L    Comment: DELTA CHECK NOTED   Chloride 101 101 - 111 mmol/L   CO2 32 22 - 32 mmol/L   Glucose, Bld 136 (H) 65 - 99 mg/dL   BUN 33 (H) 6 - 20 mg/dL   Creatinine, Ser 1.22 (H) 0.44 - 1.00 mg/dL   Calcium 8.9 8.9 - 10.3 mg/dL   GFR calc non Af Amer 39 (L) >60 mL/min   GFR calc Af Amer 45 (L) >60 mL/min    Comment: (NOTE) The eGFR has been calculated using the CKD EPI equation. This calculation has not been validated in all clinical situations. eGFR's persistently <60 mL/min signify possible Chronic Kidney Disease.    Anion gap 10 5 - 15    ABGS No results for input(s): PHART, PO2ART, TCO2, HCO3 in the last 72 hours.  Invalid input(s): PCO2 CULTURES Recent Results (from the past 240 hour(s))  Urine Culture     Status: Abnormal   Collection Time: 07/14/2017  9:26 AM  Result Value Ref Range Status   Specimen Description URINE, CATHETERIZED   Final   Special Requests NONE  Final   Culture >=100,000 COLONIES/mL SERRATIA MARCESCENS (A)  Final   Report Status 08/02/2017 FINAL  Final   Organism ID, Bacteria SERRATIA MARCESCENS (A)  Final      Susceptibility   Serratia marcescens - MIC*    CEFAZOLIN >=64 RESISTANT Resistant     CEFTRIAXONE <=1 SENSITIVE Sensitive     CIPROFLOXACIN <=0.25 SENSITIVE Sensitive     GENTAMICIN <=1 SENSITIVE Sensitive     NITROFURANTOIN 128 RESISTANT Resistant     TRIMETH/SULFA <=20 SENSITIVE Sensitive     * >=100,000 COLONIES/mL SERRATIA MARCESCENS  Blood Culture (routine x 2)     Status: None   Collection Time: 07/21/2017  9:34 AM  Result Value Ref Range Status   Specimen Description   Final    BLOOD RIGHT ANTECUBITAL Blood Culture adequate volume   Special Requests BOTTLES DRAWN AEROBIC AND ANAEROBIC  Final   Culture NO GROWTH 5 DAYS  Final   Report Status 08/05/2017 FINAL  Final  Blood Culture (routine x 2)     Status: None   Collection Time: 07/29/2017  9:45 AM  Result Value Ref Range Status   Specimen Description   Final    BLOOD BLOOD LEFT ARM IV BOTTLES DRAWN AEROBIC AND ANAEROBIC   Special Requests Blood Culture adequate volume  Final   Culture NO GROWTH 5 DAYS  Final   Report Status 08/05/2017 FINAL  Final  MRSA PCR Screening     Status: None   Collection Time: 07/26/2017  2:03 PM  Result Value Ref Range Status   MRSA by PCR NEGATIVE NEGATIVE Final    Comment:        The GeneXpert MRSA Assay (FDA approved for NASAL specimens only), is one component of a comprehensive MRSA colonization surveillance program. It is not intended to diagnose MRSA infection nor to guide or monitor treatment for MRSA infections.   C difficile quick scan w PCR reflex     Status: None   Collection Time: 08/05/17  5:18 PM  Result Value Ref Range Status   C Diff antigen NEGATIVE NEGATIVE Final   C Diff toxin NEGATIVE NEGATIVE Final   C Diff interpretation No C. difficile detected.  Final    Studies/Results: Dg Swallowing Func-speech  Pathology  Result Date: 08/05/2017 Objective Swallowing Evaluation: Type of Study: MBS-Modified Barium Swallow Study  Patient Details Name: MCKINNLEY COTTIER MRN: [id number] Date of Birth: [birth year] Today's Date: 08/05/2017 Time: SLP Start Time (ACUTE ONLY): 1200 -SLP Stop Time (ACUTE ONLY): 1300 SLP Time Calculation (min) (ACUTE ONLY): 60 min Past Medical History: Past Medical History: Diagnosis Date . Chronic atrial fibrillation ([place])  . Chronic diastolic CHF (congestive heart failure) ([place])  . CKD (chronic kidney disease), stage III ([place])  . Coronary artery disease  . Dementia  . Encounter for long-term (current) use of other medications   COUMADIN THERAPY . Essential hypertension  . GERD (gastroesophageal reflux disease)  . Lower extremity weakness  . Pericardial effusion   a. Echo 08/2014: mod pericardial effusion. . Renal artery stenosis ([place])   a. right renal artery stenosis (1-59% by duplex in 04/2013). . Sinoatrial node dysfunction (H[facility])    SINUS BRADYCARDIA . Sinus bradycardia  Past Surgical History: Past Surgical History: Procedure Laterality Date . BACK SURGERY   . CARDIOVERSION    x2 . CHOLECYSTECTOMY   . COLONOSCOPY  05/2011  Dr. Posey Pronto: normal. no bx. Marland Kitchen KNEE ARTHROSCOPY   . PACEMAKER IMPLANT  04/30/2016  SJM Endurity VR PPM implanted by Dr Amanda Pea in [place] HPI: 83 year old female with a history of chronic respiratory failure, dementia, atrial fibrillation and diastolic heart failure, presents to the hospital with generalized weakness and cough.  Found to have possible aspiration pneumonia and admitted to the hospital for further treatments.  Patient developed rapid atrial fibrillation and was started on Cardizem infusion.  She is currently on intravenous antibiotics and IV fluids.  Subjective: "Ok" Assessment / Plan / Recommendation CHL IP CLINICAL IMPRESSIONS 08/05/2017 Clinical Impression Pt presented with barium tinged thin, NTL, HTL,  puree, mech soft, and barium tablet for MBSS. Pt presents with mild oral phase, mild/mod pharyngeal phase, and suspected primary esophageal phase dysphagia characterized by decreased oral bolus cohesiveness, premature spillage (greater with thin liquids), delay in swallow initiation with swallow trigger at the level of the pyriforms with thins, mildly reduced epiglottic deflection, prominent CP, with noted air distention below the UES resulting in premature spillage of bolus, variable penetration of thins and nectars before and during the swallow, sensed and silent aspiration of cup sips thin liquids, mild vallecular residue post swallow with liquids, and delayed emptying of bolus in cervical esophagus with liquids and solids, however the barium tablet traversed to stomach without delay. Pt's delay in swallow initiation along with decreased bolus cohesiveness significantly increase risk for aspiration with thin liquids. Although, Pt did not immediately sense aspiration of thin liquids, once the Pt was cued to cough, Pt appeared to then reflexively cough but was unable to fully removed aspirated material. Pt with decreased clearance of barium in esophagus by primary peristaltic waves and did demonstrate some retrograde movement of bolus noted to expel to pyriforms (during belching?). Recommend D2/fine chop and NTL via cup sips, no straws. Medication should be presented via whole in puree or crushed. Pt had difficulty holding herself upright during assessment and may benefit from PT consult if MD feels it is appropriate. Recommend f/u SLP for dysphagia at SNF or [facility] depending on discharge recommendations. SLP will follow during acute stay. This assessment and recommendations were reviewed with spouse and Pt.   SLP Visit Diagnosis Dysphagia, oropharyngeal phase (R13.12) Attention and concentration deficit following -- Frontal lobe and executive function deficit following -- Impact on safety and function Moderate  aspiration risk;Risk for  inadequate nutrition/hydration;Severe aspiration risk   CHL IP TREATMENT RECOMMENDATION 08/05/2017 Treatment Recommendations Therapy as outlined in treatment plan below   Prognosis 08/05/2017 Prognosis for Safe Diet Advancement Fair Barriers to Reach Goals Severity of deficits Barriers/Prognosis Comment -- CHL IP DIET RECOMMENDATION 08/05/2017 SLP Diet Recommendations Dysphagia 2 (Fine chop) solids;Nectar thick liquid Liquid Administration via Cup;No straw Medication Administration Whole meds with puree Compensations Slow rate;Small sips/bites;Multiple dry swallows after each bite/sip Postural Changes Remain semi-upright after after feeds/meals (Comment);Seated upright at 90 degrees   CHL IP OTHER RECOMMENDATIONS 08/05/2017 Recommended Consults -- Oral Care Recommendations Oral care BID;Staff/trained caregiver to provide oral care Other Recommendations Order thickener from pharmacy;Clarify dietary restrictions   CHL IP FOLLOW UP RECOMMENDATIONS 08/05/2017 Follow up Recommendations Home health SLP;Skilled Nursing facility   [facility] IP FREQUENCY AND DURATION 08/05/2017 Speech Therapy Frequency (ACUTE ONLY) min 2x/week Treatment Duration 1 week      CHL IP ORAL PHASE 08/05/2017 Oral Phase Impaired Oral - Pudding Teaspoon -- Oral - Pudding Cup -- Oral - Honey Teaspoon -- Oral - Honey Cup -- Oral - Nectar Teaspoon -- Oral - Nectar Cup -- Oral - Nectar Straw -- Oral - Thin Teaspoon -- Oral - Thin Cup Lingual/palatal residue;Decreased bolus cohesion;Premature spillage Oral - Thin Straw -- Oral - Puree -- Oral - Mech Soft -- Oral - Regular -- Oral - Multi-Consistency -- Oral - Pill -- Oral Phase - Comment decreased liquid bolus cohesiveness; premature spillate  CHL IP PHARYNGEAL PHASE 08/05/2017 Pharyngeal Phase Impaired Pharyngeal- Pudding Teaspoon -- Pharyngeal -- Pharyngeal- Pudding Cup -- Pharyngeal -- Pharyngeal- Honey Teaspoon -- Pharyngeal -- Pharyngeal- Honey Cup Delayed swallow  initiation-vallecula Pharyngeal -- Pharyngeal- Nectar Teaspoon Delayed swallow initiation-vallecula;WFL Pharyngeal -- Pharyngeal- Nectar Cup Delayed swallow initiation-vallecula;Reduced epiglottic inversion;Penetration/Aspiration during swallow;Pharyngeal residue - valleculae Pharyngeal Material does not enter airway;Material enters airway, remains ABOVE vocal cords and not ejected out;Material enters airway, remains ABOVE vocal cords then ejected out Pharyngeal- Nectar Straw Delayed swallow initiation-pyriform sinuses;Penetration/Aspiration before swallow;Reduced epiglottic inversion;Pharyngeal residue - valleculae Pharyngeal Material enters airway, CONTACTS cords and then ejected out;Material enters airway, remains ABOVE vocal cords and not ejected out Pharyngeal- Thin Teaspoon Delayed swallow initiation-pyriform sinuses Pharyngeal -- Pharyngeal- Thin Cup Delayed swallow initiation-pyriform sinuses;Penetration/Aspiration during swallow;Penetration/Apiration after swallow;Reduced airway/laryngeal closure;Reduced epiglottic inversion;Trace aspiration;Pharyngeal residue - valleculae Pharyngeal Material enters airway, passes BELOW cords without attempt by patient to eject out (silent aspiration);Material enters airway, passes BELOW cords and not ejected out despite cough attempt by patient Pharyngeal- Thin Straw -- Pharyngeal -- Pharyngeal- Puree Delayed swallow initiation-vallecula;WFL Pharyngeal -- Pharyngeal- Mechanical Soft Delayed swallow initiation-vallecula Pharyngeal -- Pharyngeal- Regular -- Pharyngeal -- Pharyngeal- Multi-consistency -- Pharyngeal -- Pharyngeal- Pill WFL Pharyngeal -- Pharyngeal Comment --  CHL IP CERVICAL ESOPHAGEAL PHASE 08/05/2017 Cervical Esophageal Phase Impaired Pudding Teaspoon -- Pudding Cup -- Honey Teaspoon -- Honey Cup -- Nectar Teaspoon -- Nectar Cup -- Nectar Straw -- Thin Teaspoon -- Thin Cup Prominent cricopharyngeal segment;Esophageal backflow into cervical  esophagus;Esophageal backflow into the pharynx Thin Straw -- Puree Prominent cricopharyngeal segment;Esophageal backflow into cervical esophagus Mechanical Soft -- Regular -- Multi-consistency -- Pill -- Cervical Esophageal Comment Prominent CP with posterior pharyngeal wall thickening near UES possibly due to osteophyte and kyphosis; air distention noted below UES Thank you, Genene Churn, [place] No flowsheet data found. PORTER,DABNEY 08/05/2017, 2:44 PM               Medications:  Prior to Admission:  Medications Prior to Admission  Medication Sig Dispense Refill Last Dose  .  ALPRAZolam (XANAX) 0.5 MG tablet Take 0.5 mg by mouth at bedtime as needed for anxiety or sleep.   07/30/2017 at 1930  . Biotin 5000 MCG TABS Take by mouth daily. 10,000 mcg daily   07/30/2017 at Unknown time  . Calcium Carbonate-Vitamin D (CALTRATE 600+D PO) Take 1 tablet by mouth 2 (two) times daily.    07/30/2017 at Unknown time  . carvedilol (COREG) 12.5 MG tablet Take 6.25-12.5 mg by mouth 2 (two) times daily with a meal. Take 12.5 mg in the morning and 6.25 mg in the evening.   07/24/2017 at 0800  . Cyanocobalamin (VITAMIN B-12) 5000 MCG TBDP Take 1 tablet by mouth daily.   07/30/2017 at Unknown time  . diltiazem (CARDIZEM) 90 MG tablet Take 1 tablet (90 mg total) by mouth every evening.   07/27/2017 at 0800  . donepezil (ARICEPT) 10 MG tablet Take 1 tablet by mouth at bedtime.  6 07/30/2017 at Unknown time  . feeding supplement, GLUCERNA SHAKE, (GLUCERNA SHAKE) LIQD Take 237 mLs by mouth 3 (three) times daily between meals.   07/30/2017 at Unknown time  . furosemide (LASIX) 20 MG tablet Take 1 tablet (20 mg total) by mouth daily.   07/20/2017 at 0800  . HYDROcodone-acetaminophen (NORCO/VICODIN) 5-325 MG tablet Take 1 tablet by mouth every 8 (eight) hours as needed for pain.  0 07/30/2017 at Unknown time  . mirtazapine (REMERON) 15 MG tablet Take 1 tablet by mouth at bedtime.  5 07/30/2017 at Unknown time   . Multiple Vitamin (MULTIVITAMIN) tablet Take 1 tablet by mouth daily.    07/30/2017 at Unknown time  . NAMZARIC 28-10 MG CP24 take one capsule by mouth once daily  12 07/30/2017 at Unknown time  . omeprazole (PRILOSEC) 20 MG capsule Take 20 mg by mouth daily.   07/30/2017 at Unknown time  . potassium chloride (K-DUR) 10 MEQ tablet Take 1 tablet (10 mEq total) by mouth daily. 30 tablet 0 07/30/2017 at Unknown time  . warfarin (COUMADIN) 2 MG tablet Take 2 mg by mouth daily at 6 PM.   07/30/2017 at 1930   Scheduled: . carvedilol  12.5 mg Oral BID WC  . Chlorhexidine Gluconate Cloth  6 each Topical Daily  . donepezil  10 mg Oral QHS   And  . memantine  28 mg Oral Daily  . feeding supplement (GLUCERNA SHAKE)  237 mL Oral TID BM  . furosemide  40 mg Intravenous Daily  . ipratropium  0.5 mg Nebulization TID  . levalbuterol  1.25 mg Nebulization TID  . mouth rinse  15 mL Mouth Rinse BID  . multivitamin with minerals  1 tablet Oral Daily  . pantoprazole  40 mg Oral Daily  . sodium chloride flush  10-40 mL Intracatheter Q12H  . Warfarin - Pharmacist Dosing Inpatient   Does not apply Q24H   Continuous: . diltiazem (CARDIZEM) infusion Stopped (08/05/17 1614)  . piperacillin-tazobactam (ZOSYN)  IV 3.375 g (08/06/17 0532)   PPJ:[id number] **OR** acetaminophen, haloperidol lactate, ondansetron **OR** ondansetron (ZOFRAN) IV, RESOURCE THICKENUP CLEAR, sodium chloride flush  Assesment: She was admitted with sepsis and pneumonia.  The pneumonia seems to be from aspiration and she is on Zosyn which should cover this.  She also has a urinary tract infection and that is also covered by Zosyn.  She does seem to be improving but has ongoing aspiration which she is not aware of.  Her sepsis seems to have cleared.  She had acute on chronic renal failure which is  better  She had acute metabolic encephalopathy and I think she is probably back to her baseline.  She does have dementia  She had acute on  chronic diastolic heart failure which is being treated and I think improving Active Problems:   Acute renal failure superimposed on stage 3 chronic kidney disease (HCC)   Sepsis (HCC)   Sepsis due to undetermined organism (HCC)   Chronic diastolic CHF (congestive heart failure) (HCC)   Acute on chronic respiratory failure with hypoxia (HCC)   Acute metabolic encephalopathy   Lobar pneumonia (HCC)   Permanent atrial fibrillation (HCC)   Dementia without behavioral disturbance    Plan: Continue antibiotics.  Continue other treatments.    LOS: 6 days   Merit Gadsby L 08/06/2017, 7:30 AM

## 2017-08-06 NOTE — Progress Notes (Signed)
ANTICOAGULATION CONSULT NOTE   Pharmacy Consult for coumadin Indication: atrial fibrillation  No Known Allergies  Patient Measurements: Height: 5\' 6"  (167.6 cm) Weight: 127 lb 13.9 oz (58 kg) IBW/kg (Calculated) : 59.3  Vital Signs: Temp: 97.3 F (36.3 C) (12/28 0800) Temp Source: Oral (12/28 0800) BP: 148/113 (12/28 0900) Pulse Rate: 124 (12/28 0900)  Labs: Recent Labs    08/04/17 0520 08/05/17 0416 08/06/17 0552  HGB  --  10.9*  --   HCT  --  36.8  --   PLT  --  139*  --   LABPROT 39.6* 27.8* 22.7*  INR 3.99 2.62 2.02  CREATININE 1.30* 1.12* 1.22*   Estimated Creatinine Clearance: 30.3 mL/min (A) (by C-G formula based on SCr of 1.22 mg/dL (H)).  Medications:  Was on coumadin 2mg  po daily PTA  Assessment: 83 yo lady to continue coumadin for afib. Admission INR was therapeutic. No bleeding noted.  Will likely need lower doses than home dose while on antibiotics and poor appetite.  Warfarin dose not given on 12/23 (unable to swallow PO). Last dose given was 12/22.   Pt has not received any Warfarin since 12/22.  INR today is therapeutic.   No bleeding reported.   Goal of Therapy:  INR 2-3 Monitor platelets by anticoagulation protocol: Yes   Plan:  Coumadin 2mg  today x 1 Daily PT/INR Monitor for bleeding complications  [name], PharmD Clinical Pharmacist Pager:  [phone] 08/06/2017   08/06/2017,10:27 AM

## 2017-08-07 LAB — BASIC METABOLIC PANEL
Anion gap: 12 (ref 5–15)
BUN: 37 mg/dL — AB (ref 6–20)
CHLORIDE: 101 mmol/L (ref 101–111)
CO2: 32 mmol/L (ref 22–32)
Calcium: 9 mg/dL (ref 8.9–10.3)
Creatinine, Ser: 1.22 mg/dL — ABNORMAL HIGH (ref 0.44–1.00)
GFR calc Af Amer: 45 mL/min — ABNORMAL LOW (ref >60)
GFR calc non Af Amer: 39 mL/min — ABNORMAL LOW (ref >60)
GLUCOSE: 133 mg/dL — AB (ref 65–99)
POTASSIUM: 3.4 mmol/L — AB (ref 3.5–5.1)
Sodium: 145 mmol/L (ref 135–145)

## 2017-08-07 LAB — CBC
HEMATOCRIT: 35.5 % — AB (ref 36.0–46.0)
HEMOGLOBIN: 10.5 g/dL — AB (ref 12.0–15.0)
MCH: 28.5 pg (ref 26.0–34.0)
MCHC: 29.6 g/dL — AB (ref 30.0–36.0)
MCV: 96.2 fL (ref 78.0–100.0)
Platelets: 137 10*3/uL — ABNORMAL LOW (ref 150–400)
RBC: 3.69 MIL/uL — ABNORMAL LOW (ref 3.87–5.11)
RDW: 17 % — AB (ref 11.5–15.5)
WBC: 8.7 10*3/uL (ref 4.0–10.5)

## 2017-08-07 LAB — PROTIME-INR
INR: 2.26
Prothrombin Time: 24.8 seconds — ABNORMAL HIGH (ref 11.4–15.2)

## 2017-08-07 MED ORDER — DILTIAZEM HCL 60 MG PO TABS
60.0000 mg | ORAL_TABLET | Freq: Four times a day (QID) | ORAL | Status: DC
  Administered 2017-08-07 – 2017-08-13 (×19): 60 mg via ORAL
  Filled 2017-08-07 (×19): qty 1

## 2017-08-07 MED ORDER — WARFARIN SODIUM 2 MG PO TABS
2.0000 mg | ORAL_TABLET | Freq: Once | ORAL | Status: AC
  Administered 2017-08-07: 2 mg via ORAL
  Filled 2017-08-07: qty 1

## 2017-08-07 MED ORDER — MEMANTINE HCL 10 MG PO TABS
20.0000 mg | ORAL_TABLET | Freq: Every day | ORAL | Status: DC
  Administered 2017-08-07 – 2017-08-12 (×4): 20 mg via ORAL
  Filled 2017-08-07 (×4): qty 2

## 2017-08-07 MED ORDER — POTASSIUM CHLORIDE 10 MEQ/100ML IV SOLN
10.0000 meq | INTRAVENOUS | Status: AC
  Administered 2017-08-07 (×3): 10 meq via INTRAVENOUS
  Filled 2017-08-07 (×3): qty 100

## 2017-08-07 MED ORDER — PANTOPRAZOLE SODIUM 40 MG PO PACK
40.0000 mg | PACK | Freq: Every day | ORAL | Status: DC
  Administered 2017-08-07 – 2017-08-10 (×3): 40 mg
  Filled 2017-08-07 (×3): qty 20

## 2017-08-07 MED ORDER — LOPERAMIDE HCL 2 MG PO CAPS
4.0000 mg | ORAL_CAPSULE | Freq: Once | ORAL | Status: AC
Start: 2017-08-07 — End: 2017-08-07
  Administered 2017-08-07: 4 mg via ORAL
  Filled 2017-08-07: qty 2

## 2017-08-07 MED ORDER — POTASSIUM CHLORIDE 10 MEQ/100ML IV SOLN
INTRAVENOUS | Status: AC
  Administered 2017-08-07: 10 meq
  Filled 2017-08-07: qty 100

## 2017-08-07 NOTE — Progress Notes (Signed)
ANTICOAGULATION CONSULT NOTE   Pharmacy Consult for coumadin Indication: atrial fibrillation  No Known Allergies  Patient Measurements: Height: 5\' 6"  (167.6 cm) Weight: 127 lb 6.8 oz (57.8 kg) IBW/kg (Calculated) : 59.3  Vital Signs: Temp: 97.7 F (36.5 C) (12/29 0500) Temp Source: Oral (12/29 0500) BP: 180/71 (12/29 0900) Pulse Rate: 98 (12/29 0900)  Labs: Recent Labs    08/05/17 0416 08/06/17 0552 08/07/17 0445  HGB 10.9*  --  10.5*  HCT 36.8  --  35.5*  PLT 139*  --  137*  LABPROT 27.8* 22.7* 24.8*  INR 2.62 2.02 2.26  CREATININE 1.12* 1.22* 1.22*   Estimated Creatinine Clearance: 30.2 mL/min (A) (by C-G formula based on SCr of 1.22 mg/dL (H)).  Medications:  Was on coumadin 2mg  po daily PTA  Assessment: 83 yo lady to continue coumadin for afib. Admission INR was therapeutic. No bleeding noted.  Will likely need lower doses than home dose while on antibiotics and poor appetite.   INR today is therapeutic.   No bleeding reported.   Goal of Therapy:  INR 2-3 Monitor platelets by anticoagulation protocol: Yes   Plan:  Coumadin 2mg  today x 1 Daily PT/INR Monitor for bleeding complications  [name], PharmD Clinical Pharmacist Pager:  [phone] 08/07/2017   08/07/2017,9:57 AM

## 2017-08-07 NOTE — Progress Notes (Signed)
Report called to Earnstine Regal, RN on Unit 300.  Patient to transfer to room 318.

## 2017-08-07 NOTE — Progress Notes (Signed)
Late entry:  Patient transported to room 318.  Patient stable.  O2 4L [place].  Belongings with patient at transfer.  Patient's caregiver at bedside.

## 2017-08-07 NOTE — Progress Notes (Signed)
Subjective: She says she feels okay.  Caretaker says she had a good night.  No new complaints.  Her breathing is better.  Objective: Vital signs in last 24 hours: Temp:  [97.3 F (36.3 C)-98.2 F (36.8 C)] 97.7 F (36.5 C) (12/29 0500) Pulse Rate:  [43-149] 98 (12/29 0600) Resp:  [11-20] 19 (12/29 0600) BP: (124-162)/(55-123) 159/96 (12/29 0600) SpO2:  [90 %-100 %] 90 % (12/29 0600) Weight:  [57.8 kg (127 lb 6.8 oz)] 57.8 kg (127 lb 6.8 oz) (12/29 0600) Weight change:  Last BM Date: 08/06/17  Intake/Output from previous day: 12/28 0701 - 12/29 0700 In: 30 [I.V.:30] Out: -   PHYSICAL EXAM General appearance: alert, cooperative and no distress Resp: Still some rales in the bases Cardio: irregularly irregular rhythm GI: soft, non-tender; bowel sounds normal; no masses,  no organomegaly Extremities: extremities normal, atraumatic, no cyanosis or edema Skin warm and dry.  Does not seem confused this morning  Lab Results:  Results for orders placed or performed during the hospital encounter of 08/05/2017 (from the past 48 hour(s))  C difficile quick scan w PCR reflex     Status: None   Collection Time: 08/05/17  5:18 PM  Result Value Ref Range   C Diff antigen NEGATIVE NEGATIVE   C Diff toxin NEGATIVE NEGATIVE   C Diff interpretation No C. difficile detected.   Protime-INR     Status: Abnormal   Collection Time: 08/06/17  5:52 AM  Result Value Ref Range   Prothrombin Time 22.7 (H) 11.4 - 15.2 seconds   INR 8.92   Basic metabolic panel     Status: Abnormal   Collection Time: 08/06/17  5:52 AM  Result Value Ref Range   Sodium 143 135 - 145 mmol/L   Potassium 3.8 3.5 - 5.1 mmol/L    Comment: DELTA CHECK NOTED   Chloride 101 101 - 111 mmol/L   CO2 32 22 - 32 mmol/L   Glucose, Bld 136 (H) 65 - 99 mg/dL   BUN 33 (H) 6 - 20 mg/dL   Creatinine, Ser 1.22 (H) 0.44 - 1.00 mg/dL   Calcium 8.9 8.9 - 10.3 mg/dL   GFR calc non Af Amer 39 (L) >60 mL/min   GFR calc Af Amer 45 (L) >60  mL/min    Comment: (NOTE) The eGFR has been calculated using the CKD EPI equation. This calculation has not been validated in all clinical situations. eGFR's persistently <60 mL/min signify possible Chronic Kidney Disease.    Anion gap 10 5 - 15  Protime-INR     Status: Abnormal   Collection Time: 08/07/17  4:45 AM  Result Value Ref Range   Prothrombin Time 24.8 (H) 11.4 - 15.2 seconds   INR 1.19   Basic metabolic panel     Status: Abnormal   Collection Time: 08/07/17  4:45 AM  Result Value Ref Range   Sodium 145 135 - 145 mmol/L   Potassium 3.4 (L) 3.5 - 5.1 mmol/L   Chloride 101 101 - 111 mmol/L   CO2 32 22 - 32 mmol/L   Glucose, Bld 133 (H) 65 - 99 mg/dL   BUN 37 (H) 6 - 20 mg/dL   Creatinine, Ser 1.22 (H) 0.44 - 1.00 mg/dL   Calcium 9.0 8.9 - 10.3 mg/dL   GFR calc non Af Amer 39 (L) >60 mL/min   GFR calc Af Amer 45 (L) >60 mL/min    Comment: (NOTE) The eGFR has been calculated using the CKD EPI equation.  This calculation has not been validated in all clinical situations. eGFR's persistently <60 mL/min signify possible Chronic Kidney Disease.    Anion gap 12 5 - 15  CBC     Status: Abnormal   Collection Time: 08/07/17  4:45 AM  Result Value Ref Range   WBC 8.7 4.0 - 10.5 K/uL   RBC 3.69 (L) 3.87 - 5.11 MIL/uL   Hemoglobin 10.5 (L) 12.0 - 15.0 g/dL   HCT 35.5 (L) 36.0 - 46.0 %   MCV 96.2 78.0 - 100.0 fL   MCH 28.5 26.0 - 34.0 pg   MCHC 29.6 (L) 30.0 - 36.0 g/dL   RDW 17.0 (H) 11.5 - 15.5 %   Platelets 137 (L) 150 - 400 K/uL    ABGS No results for input(s): PHART, PO2ART, TCO2, HCO3 in the last 72 hours.  Invalid input(s): PCO2 CULTURES Recent Results (from the past 240 hour(s))  Urine Culture     Status: Abnormal   Collection Time: 07/30/2017  9:26 AM  Result Value Ref Range Status   Specimen Description URINE, CATHETERIZED  Final   Special Requests NONE  Final   Culture >=100,000 COLONIES/mL SERRATIA MARCESCENS (A)  Final   Report Status 08/02/2017 FINAL   Final   Organism ID, Bacteria SERRATIA MARCESCENS (A)  Final      Susceptibility   Serratia marcescens - MIC*    CEFAZOLIN >=64 RESISTANT Resistant     CEFTRIAXONE <=1 SENSITIVE Sensitive     CIPROFLOXACIN <=0.25 SENSITIVE Sensitive     GENTAMICIN <=1 SENSITIVE Sensitive     NITROFURANTOIN 128 RESISTANT Resistant     TRIMETH/SULFA <=20 SENSITIVE Sensitive     * >=100,000 COLONIES/mL SERRATIA MARCESCENS  Blood Culture (routine x 2)     Status: None   Collection Time: 07/23/2017  9:34 AM  Result Value Ref Range Status   Specimen Description   Final    BLOOD RIGHT ANTECUBITAL Blood Culture adequate volume   Special Requests BOTTLES DRAWN AEROBIC AND ANAEROBIC  Final   Culture NO GROWTH 5 DAYS  Final   Report Status 08/05/2017 FINAL  Final  Blood Culture (routine x 2)     Status: None   Collection Time: 08/02/2017  9:45 AM  Result Value Ref Range Status   Specimen Description   Final    BLOOD BLOOD LEFT ARM IV BOTTLES DRAWN AEROBIC AND ANAEROBIC   Special Requests Blood Culture adequate volume  Final   Culture NO GROWTH 5 DAYS  Final   Report Status 08/05/2017 FINAL  Final  MRSA PCR Screening     Status: None   Collection Time: 07/28/2017  2:03 PM  Result Value Ref Range Status   MRSA by PCR NEGATIVE NEGATIVE Final    Comment:        The GeneXpert MRSA Assay (FDA approved for NASAL specimens only), is one component of a comprehensive MRSA colonization surveillance program. It is not intended to diagnose MRSA infection nor to guide or monitor treatment for MRSA infections.   C difficile quick scan w PCR reflex     Status: None   Collection Time: 08/05/17  5:18 PM  Result Value Ref Range Status   C Diff antigen NEGATIVE NEGATIVE Final   C Diff toxin NEGATIVE NEGATIVE Final   C Diff interpretation No C. difficile detected.  Final   Studies/Results: Dg Swallowing Func-speech Pathology  Result Date: 08/05/2017 Objective Swallowing Evaluation: Type of Study: MBS-Modified Barium  Swallow Study  Patient Details Name: RICKELL WIEHE  MRN: [id number] Date of Birth: 1929/03/07 Today's Date: 08/05/2017 Time: SLP Start Time (ACUTE ONLY): 1200 -SLP Stop Time (ACUTE ONLY): 1300 SLP Time Calculation (min) (ACUTE ONLY): 60 min Past Medical History: Past Medical History: Diagnosis Date . Chronic atrial fibrillation ([place])  . Chronic diastolic CHF (congestive heart failure) ([place])  . CKD (chronic kidney disease), stage III ([place])  . Coronary artery disease  . Dementia  . Encounter for long-term (current) use of other medications   COUMADIN THERAPY . Essential hypertension  . GERD (gastroesophageal reflux disease)  . Lower extremity weakness  . Pericardial effusion   a. Echo 08/2014: mod pericardial effusion. . Renal artery stenosis ([place])   a. right renal artery stenosis (1-59% by duplex in 04/2013). . Sinoatrial node dysfunction ([facility])    SINUS BRADYCARDIA . Sinus bradycardia  Past Surgical History: Past Surgical History: Procedure Laterality Date . BACK SURGERY   . CARDIOVERSION    x2 . CHOLECYSTECTOMY   . COLONOSCOPY  05/2011  Dr. Posey Pronto: normal. no bx. Marland Kitchen KNEE ARTHROSCOPY   . PACEMAKER IMPLANT  04/30/2016  SJM Endurity VR PPM implanted by Dr Amanda Pea in [place] HPI: 83 year old female with a history of chronic respiratory failure, dementia, atrial fibrillation and diastolic heart failure, presents to the hospital with generalized weakness and cough.  Found to have possible aspiration pneumonia and admitted to the hospital for further treatments.  Patient developed rapid atrial fibrillation and was started on Cardizem infusion.  She is currently on intravenous antibiotics and IV fluids.  Subjective: "Ok" Assessment / Plan / Recommendation CHL IP CLINICAL IMPRESSIONS 08/05/2017 Clinical Impression Pt presented with barium tinged thin, NTL, HTL, puree, mech soft, and barium tablet for MBSS. Pt presents with mild oral phase, mild/mod pharyngeal phase, and suspected primary esophageal phase dysphagia  characterized by decreased oral bolus cohesiveness, premature spillage (greater with thin liquids), delay in swallow initiation with swallow trigger at the level of the pyriforms with thins, mildly reduced epiglottic deflection, prominent CP, with noted air distention below the UES resulting in premature spillage of bolus, variable penetration of thins and nectars before and during the swallow, sensed and silent aspiration of cup sips thin liquids, mild vallecular residue post swallow with liquids, and delayed emptying of bolus in cervical esophagus with liquids and solids, however the barium tablet traversed to stomach without delay. Pt's delay in swallow initiation along with decreased bolus cohesiveness significantly increase risk for aspiration with thin liquids. Although, Pt did not immediately sense aspiration of thin liquids, once the Pt was cued to cough, Pt appeared to then reflexively cough but was unable to fully removed aspirated material. Pt with decreased clearance of barium in esophagus by primary peristaltic waves and did demonstrate some retrograde movement of bolus noted to expel to pyriforms (during belching?). Recommend D2/fine chop and NTL via cup sips, no straws. Medication should be presented via whole in puree or crushed. Pt had difficulty holding herself upright during assessment and may benefit from PT consult if MD feels it is appropriate. Recommend f/u SLP for dysphagia at SNF or [facility] depending on discharge recommendations. SLP will follow during acute stay. This assessment and recommendations were reviewed with spouse and Pt.   SLP Visit Diagnosis Dysphagia, oropharyngeal phase (R13.12) Attention and concentration deficit following -- Frontal lobe and executive function deficit following -- Impact on safety and function Moderate aspiration risk;Risk for inadequate nutrition/hydration;Severe aspiration risk   CHL IP TREATMENT RECOMMENDATION 08/05/2017 Treatment Recommendations Therapy as  outlined in treatment plan below  Prognosis 08/05/2017 Prognosis for Safe Diet Advancement Fair Barriers to Reach Goals Severity of deficits Barriers/Prognosis Comment -- CHL IP DIET RECOMMENDATION 08/05/2017 SLP Diet Recommendations Dysphagia 2 (Fine chop) solids;Nectar thick liquid Liquid Administration via Cup;No straw Medication Administration Whole meds with puree Compensations Slow rate;Small sips/bites;Multiple dry swallows after each bite/sip Postural Changes Remain semi-upright after after feeds/meals (Comment);Seated upright at 90 degrees   CHL IP OTHER RECOMMENDATIONS 08/05/2017 Recommended Consults -- Oral Care Recommendations Oral care BID;Staff/trained caregiver to provide oral care Other Recommendations Order thickener from pharmacy;Clarify dietary restrictions   CHL IP FOLLOW UP RECOMMENDATIONS 08/05/2017 Follow up Recommendations Home health SLP;Skilled Nursing facility   [facility] IP FREQUENCY AND DURATION 08/05/2017 Speech Therapy Frequency (ACUTE ONLY) min 2x/week Treatment Duration 1 week      CHL IP ORAL PHASE 08/05/2017 Oral Phase Impaired Oral - Pudding Teaspoon -- Oral - Pudding Cup -- Oral - Honey Teaspoon -- Oral - Honey Cup -- Oral - Nectar Teaspoon -- Oral - Nectar Cup -- Oral - Nectar Straw -- Oral - Thin Teaspoon -- Oral - Thin Cup Lingual/palatal residue;Decreased bolus cohesion;Premature spillage Oral - Thin Straw -- Oral - Puree -- Oral - Mech Soft -- Oral - Regular -- Oral - Multi-Consistency -- Oral - Pill -- Oral Phase - Comment decreased liquid bolus cohesiveness; premature spillate  CHL IP PHARYNGEAL PHASE 08/05/2017 Pharyngeal Phase Impaired Pharyngeal- Pudding Teaspoon -- Pharyngeal -- Pharyngeal- Pudding Cup -- Pharyngeal -- Pharyngeal- Honey Teaspoon -- Pharyngeal -- Pharyngeal- Honey Cup Delayed swallow initiation-vallecula Pharyngeal -- Pharyngeal- Nectar Teaspoon Delayed swallow initiation-vallecula;WFL Pharyngeal -- Pharyngeal- Nectar Cup Delayed swallow  initiation-vallecula;Reduced epiglottic inversion;Penetration/Aspiration during swallow;Pharyngeal residue - valleculae Pharyngeal Material does not enter airway;Material enters airway, remains ABOVE vocal cords and not ejected out;Material enters airway, remains ABOVE vocal cords then ejected out Pharyngeal- Nectar Straw Delayed swallow initiation-pyriform sinuses;Penetration/Aspiration before swallow;Reduced epiglottic inversion;Pharyngeal residue - valleculae Pharyngeal Material enters airway, CONTACTS cords and then ejected out;Material enters airway, remains ABOVE vocal cords and not ejected out Pharyngeal- Thin Teaspoon Delayed swallow initiation-pyriform sinuses Pharyngeal -- Pharyngeal- Thin Cup Delayed swallow initiation-pyriform sinuses;Penetration/Aspiration during swallow;Penetration/Apiration after swallow;Reduced airway/laryngeal closure;Reduced epiglottic inversion;Trace aspiration;Pharyngeal residue - valleculae Pharyngeal Material enters airway, passes BELOW cords without attempt by patient to eject out (silent aspiration);Material enters airway, passes BELOW cords and not ejected out despite cough attempt by patient Pharyngeal- Thin Straw -- Pharyngeal -- Pharyngeal- Puree Delayed swallow initiation-vallecula;WFL Pharyngeal -- Pharyngeal- Mechanical Soft Delayed swallow initiation-vallecula Pharyngeal -- Pharyngeal- Regular -- Pharyngeal -- Pharyngeal- Multi-consistency -- Pharyngeal -- Pharyngeal- Pill WFL Pharyngeal -- Pharyngeal Comment --  CHL IP CERVICAL ESOPHAGEAL PHASE 08/05/2017 Cervical Esophageal Phase Impaired Pudding Teaspoon -- Pudding Cup -- Honey Teaspoon -- Honey Cup -- Nectar Teaspoon -- Nectar Cup -- Nectar Straw -- Thin Teaspoon -- Thin Cup Prominent cricopharyngeal segment;Esophageal backflow into cervical esophagus;Esophageal backflow into the pharynx Thin Straw -- Puree Prominent cricopharyngeal segment;Esophageal backflow into cervical esophagus Mechanical Soft -- Regular --  Multi-consistency -- Pill -- Cervical Esophageal Comment Prominent CP with posterior pharyngeal wall thickening near UES possibly due to osteophyte and kyphosis; air distention noted below UES Thank you, Genene Churn, [place] No flowsheet data found. PORTER,DABNEY 08/05/2017, 2:44 PM               Medications:  Prior to Admission:  Medications Prior to Admission  Medication Sig Dispense Refill Last Dose  . ALPRAZolam (XANAX) 0.5 MG tablet Take 0.5 mg by mouth at bedtime as needed for anxiety or sleep.   07/30/2017  at [place]  . Biotin 5000 MCG TABS Take by mouth daily. 10,000 mcg daily   07/30/2017 at Unknown time  . Calcium Carbonate-Vitamin D (CALTRATE 600+D PO) Take 1 tablet by mouth 2 (two) times daily.    07/30/2017 at Unknown time  . carvedilol (COREG) 12.5 MG tablet Take 6.25-12.5 mg by mouth 2 (two) times daily with a meal. Take 12.5 mg in the morning and 6.25 mg in the evening.   07/26/2017 at 0800  . Cyanocobalamin (VITAMIN B-12) 5000 MCG TBDP Take 1 tablet by mouth daily.   07/30/2017 at Unknown time  . diltiazem (CARDIZEM) 90 MG tablet Take 1 tablet (90 mg total) by mouth every evening.   08/06/2017 at 0800  . donepezil (ARICEPT) 10 MG tablet Take 1 tablet by mouth at bedtime.  6 07/30/2017 at Unknown time  . feeding supplement, GLUCERNA SHAKE, (GLUCERNA SHAKE) LIQD Take 237 mLs by mouth 3 (three) times daily between meals.   07/30/2017 at Unknown time  . furosemide (LASIX) 20 MG tablet Take 1 tablet (20 mg total) by mouth daily.   07/19/2017 at 0800  . HYDROcodone-acetaminophen (NORCO/VICODIN) 5-325 MG tablet Take 1 tablet by mouth every 8 (eight) hours as needed for pain.  0 07/30/2017 at Unknown time  . mirtazapine (REMERON) 15 MG tablet Take 1 tablet by mouth at bedtime.  5 07/30/2017 at Unknown time  . Multiple Vitamin (MULTIVITAMIN) tablet Take 1 tablet by mouth daily.    07/30/2017 at Unknown time  . NAMZARIC 28-10 MG CP24 take one capsule by mouth once daily  12  07/30/2017 at Unknown time  . omeprazole (PRILOSEC) 20 MG capsule Take 20 mg by mouth daily.   07/30/2017 at Unknown time  . potassium chloride (K-DUR) 10 MEQ tablet Take 1 tablet (10 mEq total) by mouth daily. 30 tablet 0 07/30/2017 at Unknown time  . warfarin (COUMADIN) 2 MG tablet Take 2 mg by mouth daily at 6 PM.   07/30/2017 at 1930   Scheduled: . amoxicillin-clavulanate  1 tablet Oral Q12H  . carvedilol  12.5 mg Oral BID WC  . Chlorhexidine Gluconate Cloth  6 each Topical Daily  . diltiazem  60 mg Oral Q8H  . donepezil  10 mg Oral QHS   And  . memantine  28 mg Oral Daily  . feeding supplement (GLUCERNA SHAKE)  237 mL Oral TID BM  . furosemide  20 mg Oral Daily  . ipratropium  0.5 mg Nebulization TID  . levalbuterol  1.25 mg Nebulization TID  . mouth rinse  15 mL Mouth Rinse BID  . multivitamin with minerals  1 tablet Oral Daily  . pantoprazole  40 mg Oral Daily  . sodium chloride flush  10-40 mL Intracatheter Q12H  . Warfarin - Pharmacist Dosing Inpatient   Does not apply Q24H   Continuous: . diltiazem (CARDIZEM) infusion Stopped (08/05/17 1614)   QPY:[id number] **OR** acetaminophen, haloperidol lactate, ondansetron **OR** ondansetron (ZOFRAN) IV, RESOURCE THICKENUP CLEAR, sodium chloride flush  Assesment: She was admitted with acute on chronic hypoxic respiratory failure.  She had developed increasing oxygen requirements.  She has pneumonia from aspiration.  She is improving.  Her oxygen requirement has decreased.  She had atrial fib with rapid ventricular response and that is much better.  She is being treated with dietary modification and close attention to her swallowing as far as the chronic aspiration is concerned.  Her dementia appears to be at baseline Active Problems:   Acute renal failure superimposed on stage 3  chronic kidney disease (H[facility])   Sepsis ([place])   Sepsis due to undetermined organism ([place])   Chronic diastolic CHF (congestive heart failure) (HCC)   Acute  on chronic respiratory failure with hypoxia (HCC)   Acute metabolic encephalopathy   Lobar pneumonia (HCC)   Permanent atrial fibrillation (HCC)   Dementia without behavioral disturbance    Plan: She has plans to move to the floor but no beds are available now.    LOS: 7 days   [place] V[place] L 08/07/2017, 7:43 AM

## 2017-08-07 NOTE — Progress Notes (Signed)
PROGRESS NOTE    [name]  YNW:[id number] DOB: [birth year] DOA: 08/05/2017 PCP: Ignatius Specking, MD    Brief Narrative:  83 year old female with a history of chronic respiratory failure, dementia, atrial fibrillation and diastolic heart failure, presents to the hospital with generalized weakness and cough.  Found to have possible aspiration pneumonia and admitted to the hospital for further treatments.  Patient developed rapid atrial fibrillation and was started on Cardizem infusion. Overall sepsis and pneumonia have improved. Respiratory status is also improving as oxygen is being titrated down. Seen by speech therapy and had MBS done which showed sensed and silent aspiration. Now on modified diet. She has been weaned off cardizem infusion. Seen by physical therapy and SNF was recommended. Currently awaiting placement.   Assessment & Plan:   Active Problems:   Acute renal failure superimposed on stage 3 chronic kidney disease (HCC)   Sepsis (HCC)   Sepsis due to undetermined organism (HCC)   Chronic diastolic CHF (congestive heart failure) (HCC)   Acute on chronic respiratory failure with hypoxia (HCC)   Acute metabolic encephalopathy   Lobar pneumonia (HCC)   Permanent atrial fibrillation (HCC)   Dementia without behavioral disturbance   1. Sepsis.  Secondary to pneumonia.  Lactic acid 1.5 on admission, trended down with IV fluids.  Influenza panel negative.  Continue antibiotics. 2. Aspiration pneumonia. Swallow evaluation as below.  Treated with IV zosyn. Since she is clinically improving. Transitioned to augmentin 3. Dysphagia.  Staff has noted significant dysphagia with any p.o. intake.  Speech therapy evaluated patient and she underwent MBS. This showed delay in swallow initiation and sensed as well as silent aspitiration. She has been put on D2/fine chop and nectar thick liquids.   4. Acute on chronic respiratory failure with hypoxia.  Related to pneumonia/pulmonary edema.   Chronically on 2 L.  Required partial rebreather mask.  Currently, she has been weaned down to 4L HFNC. Wean down oxygen as tolerated. 5. Acute on chronic renal failure, chronic kidney disease stage III.  Creatinine on presentation of 1.6.  Trended down with diuresis. Continue to monitor in the setting of diuresis.  Currently stable.  6. Urinary tract infection.  Too numerous to count WBCs on urinalysis.  Urine culture positive for serratia. This has been adequately treated with IV Zosyn.  7. Acute metabolic encephalopathy.  Suspect this is related to sepsis.  She has baseline dementia. Mental status back to baseline 8. Atrial fibrillation with rapid ventricular response.  Initially treated with intravenous metoprolol as well as intravenous Cardizem infusion to control heart rate.  Since she is tolerating po diet, she was started on coreg and now oral diltiazem with improvement of heart rate. She has been weaned off cardizem infusion.  She is anticoagulated with Coumadin. 9. Acute on Chronic diastolic CHF.  Chest x-ray initially showed worsening pulmonary edema.  Patient had elevated BNP.  Likely precipitated by IV hydration for pneumonia.  Treated with IV Lasix. Overall volume status improving. Transitioned to oral lasix on 12/29. 10. Hypokalemia. Related to diuretics. Replace. 11. Goals of care. Discussed with son and husband. They wish to continue with current treatment plans. Full code.   DVT prophylaxis: Coumadin Code Status: Full code Family Communication: Discussed with son at the bedside Disposition Plan: Needs SNF placement on discharge   Consultants:   Pulmonology  Speech therapy  Procedures:     Antimicrobials:   Zosyn 12/22 >12/28  Azithromycin 12/23 >12/26  augmentin 12/28>   Subjective: No productive  cough. Shortness of breath is better. Feels weak  Objective: Vitals:   08/07/17 0600 08/07/17 0811 08/07/17 0853 08/07/17 0900  BP: (!) 159/96  (!) 173/89 (!)  180/71  Pulse: 98  85 98  Resp: 19  19 14   Temp:      TempSrc:      SpO2: 90% 97% 92% 95%  Weight: 57.8 kg (127 lb 6.8 oz)     Height:        Intake/Output Summary (Last 24 hours) at 08/07/2017 0949 Last data filed at 08/06/2017 2129 Gross per 24 hour  Intake 30 ml  Output -  Net 30 ml   Filed Weights   08/01/17 0530 08/03/17 0300 08/07/17 0600  Weight: 56.6 kg (124 lb 12.5 oz) 58 kg (127 lb 13.9 oz) 57.8 kg (127 lb 6.8 oz)    Examination:  General exam: Appears calm and comfortable  Respiratory system: coarse breath sounds, mostly in upper airways.  normal respiratory effort. Cardiovascular system: S1 & S2 heard, irregular. No JVD, murmurs, rubs, gallops or clicks. No pedal edema. Gastrointestinal system: Abdomen is nondistended, soft and nontender. No organomegaly or masses felt. Normal bowel sounds heard. Central nervous system: No focal neurological deficits. Extremities: Symmetric 5 x 5 power. Skin: No rashes, lesions or ulcers Psychiatry: awake, pleasant    Data Reviewed: I have personally reviewed following labs and imaging studies  CBC: Recent Labs  Lab 08/01/17 0409 08/02/17 0632 08/03/17 0500 08/05/17 0416 08/07/17 0445  WBC 10.8* 11.1* 10.8* 11.9* 8.7  HGB 11.3* 11.2* 10.4* 10.9* 10.5*  HCT 38.8 38.0 35.3* 36.8 35.5*  MCV 96.5 97.9 96.4 94.8 96.2  PLT 154 161 135* 139* 137*   Basic Metabolic Panel: Recent Labs  Lab 08/01/17 0409  08/03/17 0500 08/04/17 0520 08/05/17 0416 08/06/17 0552 08/07/17 0445  NA 143   < > 144 149* 146* 143 145  K 4.3   < > 3.7 2.9* 3.0* 3.8 3.4*  CL 106   < > 108 106 102 101 101  CO2 26   < > 24 27 30  32 32  GLUCOSE 115*   < > 131* 118* 151* 136* 133*  BUN 38*   < > 43* 37* 32* 33* 37*  CREATININE 1.36*   < > 1.41* 1.30* 1.12* 1.22* 1.22*  CALCIUM 8.7*   < > 8.5* 8.8* 8.8* 8.9 9.0  MG 1.9  --   --   --  1.6*  --   --    < > = values in this interval not displayed.   GFR: Estimated Creatinine Clearance: 30.2  mL/min (A) (by C-G formula based on SCr of 1.22 mg/dL (H)). Liver Function Tests: No results for input(s): AST, ALT, ALKPHOS, BILITOT, PROT, ALBUMIN in the last 168 hours. No results for input(s): LIPASE, AMYLASE in the last 168 hours. Recent Labs  Lab 08/01/17 1348  AMMONIA 36*   Coagulation Profile: Recent Labs  Lab 08/03/17 0500 08/04/17 0520 08/05/17 0416 08/06/17 0552 08/07/17 0445  INR 3.90 3.99 2.62 2.02 2.26   Cardiac Enzymes: No results for input(s): CKTOTAL, CKMB, CKMBINDEX, TROPONINI in the last 168 hours. BNP (last 3 results) No results for input(s): PROBNP in the last 8760 hours. HbA1C: No results for input(s): HGBA1C in the last 72 hours. CBG: No results for input(s): GLUCAP in the last 168 hours. Lipid Profile: No results for input(s): CHOL, HDL, LDLCALC, TRIG, CHOLHDL, LDLDIRECT in the last 72 hours. Thyroid Function Tests: No results for input(s): TSH, T4TOTAL, FREET4, T3FREE,  THYROIDAB in the last 72 hours. Anemia Panel: No results for input(s): VITAMINB12, FOLATE, FERRITIN, TIBC, IRON, RETICCTPCT in the last 72 hours. Sepsis Labs: Recent Labs  Lab 08/03/2017 1615  PROCALCITON 2.44  LATICACIDVEN 0.8    Recent Results (from the past 240 hour(s))  Urine Culture     Status: Abnormal   Collection Time: 07/26/2017  9:26 AM  Result Value Ref Range Status   Specimen Description URINE, CATHETERIZED  Final   Special Requests NONE  Final   Culture >=100,000 COLONIES/mL SERRATIA MARCESCENS (A)  Final   Report Status 08/02/2017 FINAL  Final   Organism ID, Bacteria SERRATIA MARCESCENS (A)  Final      Susceptibility   Serratia marcescens - MIC*    CEFAZOLIN >=64 RESISTANT Resistant     CEFTRIAXONE <=1 SENSITIVE Sensitive     CIPROFLOXACIN <=0.25 SENSITIVE Sensitive     GENTAMICIN <=1 SENSITIVE Sensitive     NITROFURANTOIN 128 RESISTANT Resistant     TRIMETH/SULFA <=20 SENSITIVE Sensitive     * >=100,000 COLONIES/mL SERRATIA MARCESCENS  Blood Culture (routine  x 2)     Status: None   Collection Time: 07/16/2017  9:34 AM  Result Value Ref Range Status   Specimen Description   Final    BLOOD RIGHT ANTECUBITAL Blood Culture adequate volume   Special Requests BOTTLES DRAWN AEROBIC AND ANAEROBIC  Final   Culture NO GROWTH 5 DAYS  Final   Report Status 08/05/2017 FINAL  Final  Blood Culture (routine x 2)     Status: None   Collection Time: 08/02/2017  9:45 AM  Result Value Ref Range Status   Specimen Description   Final    BLOOD BLOOD LEFT ARM IV BOTTLES DRAWN AEROBIC AND ANAEROBIC   Special Requests Blood Culture adequate volume  Final   Culture NO GROWTH 5 DAYS  Final   Report Status 08/05/2017 FINAL  Final  MRSA PCR Screening     Status: None   Collection Time: 07/28/2017  2:03 PM  Result Value Ref Range Status   MRSA by PCR NEGATIVE NEGATIVE Final    Comment:        The GeneXpert MRSA Assay (FDA approved for NASAL specimens only), is one component of a comprehensive MRSA colonization surveillance program. It is not intended to diagnose MRSA infection nor to guide or monitor treatment for MRSA infections.   C difficile quick scan w PCR reflex     Status: None   Collection Time: 08/05/17  5:18 PM  Result Value Ref Range Status   C Diff antigen NEGATIVE NEGATIVE Final   C Diff toxin NEGATIVE NEGATIVE Final   C Diff interpretation No C. difficile detected.  Final         Radiology Studies: Dg Swallowing Func-speech Pathology  Result Date: 08/05/2017 Objective Swallowing Evaluation: Type of Study: MBS-Modified Barium Swallow Study  Patient Details Name: [name] MRN: [id number] Date of Birth: [birth year] Today's Date: 08/05/2017 Time: SLP Start Time (ACUTE ONLY): 1200 -SLP Stop Time (ACUTE ONLY): 1300 SLP Time Calculation (min) (ACUTE ONLY): 60 min Past Medical History: Past Medical History: Diagnosis Date . Chronic atrial fibrillation (HCC)  . Chronic diastolic CHF (congestive heart failure) (HCC)  . CKD (chronic kidney disease),  stage III (HCC)  . Coronary artery disease  . Dementia  . Encounter for long-term (current) use of other medications   COUMADIN THERAPY . Essential hypertension  . GERD (gastroesophageal reflux disease)  . Lower extremity weakness  . Pericardial effusion  a. Echo 08/2014: mod pericardial effusion. . Renal artery stenosis (HCC)   a. right renal artery stenosis (1-59% by duplex in 04/2013). . Sinoatrial node dysfunction (HCC)    SINUS BRADYCARDIA . Sinus bradycardia  Past Surgical History: Past Surgical History: Procedure Laterality Date . BACK SURGERY   . CARDIOVERSION    x2 . CHOLECYSTECTOMY   . COLONOSCOPY  05/2011  Dr. Allena KatzPatel: normal. no bx. Marland Kitchen. KNEE ARTHROSCOPY   . PACEMAKER IMPLANT  04/30/2016  SJM Endurity VR PPM implanted by Dr Danella MaiersJack Painter [place] HPI: 83 year old female with a history of chronic respiratory failure, dementia, atrial fibrillation and diastolic heart failure, presents to the hospital with generalized weakness and cough.  Found to have possible aspiration pneumonia and admitted to the hospital for further treatments.  Patient developed rapid atrial fibrillation and was started on Cardizem infusion.  She is currently on intravenous antibiotics and IV fluids.  Subjective: "Ok" Assessment / Plan / Recommendation CHL IP CLINICAL IMPRESSIONS 08/05/2017 Clinical Impression Pt presented with barium tinged thin, NTL, HTL, puree, mech soft, and barium tablet for MBSS. Pt presents with mild oral phase, mild/mod pharyngeal phase, and suspected primary esophageal phase dysphagia characterized by decreased oral bolus cohesiveness, premature spillage (greater with thin liquids), delay in swallow initiation with swallow trigger at the level of the pyriforms with thins, mildly reduced epiglottic deflection, prominent CP, with noted air distention below the UES resulting in premature spillage of bolus, variable penetration of thins and nectars before and during the swallow, sensed and silent aspiration of  cup sips thin liquids, mild vallecular residue post swallow with liquids, and delayed emptying of bolus in cervical esophagus with liquids and solids, however the barium tablet traversed to stomach without delay. Pt's delay in swallow initiation along with decreased bolus cohesiveness significantly increase risk for aspiration with thin liquids. Although, Pt did not immediately sense aspiration of thin liquids, once the Pt was cued to cough, Pt appeared to then reflexively cough but was unable to fully removed aspirated material. Pt with decreased clearance of barium in esophagus by primary peristaltic waves and did demonstrate some retrograde movement of bolus noted to expel to pyriforms (during belching?). Recommend D2/fine chop and NTL via cup sips, no straws. Medication should be presented via whole in puree or crushed. Pt had difficulty holding herself upright during assessment and may benefit from PT consult if MD feels it is appropriate. [facility] recommendations. SLP will follow during acute stay. This assessment and recommendations were reviewed with spouse and Pt.   SLP Visit Diagnosis Dysphagia, oropharyngeal phase (R13.12) Attention and concentration deficit following -- Frontal lobe and executive function deficit following -- Impact on safety and function Moderate aspiration risk;Risk for inadequate nutrition/hydration;Severe aspiration risk   CHL IP TREATMENT RECOMMENDATION 08/05/2017 Treatment Recommendations Therapy as outlined in treatment plan below   Prognosis 08/05/2017 Prognosis for Safe Diet Advancement Fair Barriers to Reach Goals Severity of deficits Barriers/Prognosis Comment -- CHL IP DIET RECOMMENDATION 08/05/2017 SLP Diet Recommendations Dysphagia 2 (Fine chop) solids;Nectar thick liquid Liquid Administration via Cup;No straw Medication Administration Whole meds with puree Compensations Slow rate;Small sips/bites;Multiple dry  swallows after each bite/sip Postural Changes Remain semi-upright after after feeds/meals (Comment);Seated upright at 90 degrees   CHL IP OTHER RECOMMENDATIONS 08/05/2017 Recommended Consults -- Oral Care Recommendations Oral care BID;Staff/trained caregiver to provide oral care Other Recommendations Order thickener from pharmacy;Clarify dietary restrictions   CHL IP FOLLOW UP RECOMMENDATIONS 08/05/2017 Follow  up Recommendations Home health SLP;Skilled Nursing facility   [facility] IP FREQUENCY AND DURATION 08/05/2017 Speech Therapy Frequency (ACUTE ONLY) min 2x/week Treatment Duration 1 week      CHL IP ORAL PHASE 08/05/2017 Oral Phase Impaired Oral - Pudding Teaspoon -- Oral - Pudding Cup -- Oral - Honey Teaspoon -- Oral - Honey Cup -- Oral - Nectar Teaspoon -- Oral - Nectar Cup -- Oral - Nectar Straw -- Oral - Thin Teaspoon -- Oral - Thin Cup Lingual/palatal residue;Decreased bolus cohesion;Premature spillage Oral - Thin Straw -- Oral - Puree -- Oral - Mech Soft -- Oral - Regular -- Oral - Multi-Consistency -- Oral - Pill -- Oral Phase - Comment decreased liquid bolus cohesiveness; premature spillate  CHL IP PHARYNGEAL PHASE 08/05/2017 Pharyngeal Phase Impaired Pharyngeal- Pudding Teaspoon -- Pharyngeal -- Pharyngeal- Pudding Cup -- Pharyngeal -- Pharyngeal- Honey Teaspoon -- Pharyngeal -- Pharyngeal- Honey Cup Delayed swallow initiation-vallecula Pharyngeal -- Pharyngeal- Nectar Teaspoon Delayed swallow initiation-vallecula;WFL Pharyngeal -- Pharyngeal- Nectar Cup Delayed swallow initiation-vallecula;Reduced epiglottic inversion;Penetration/Aspiration during swallow;Pharyngeal residue - valleculae Pharyngeal Material does not enter airway;Material enters airway, remains ABOVE vocal cords and not ejected out;Material enters airway, remains ABOVE vocal cords then ejected out Pharyngeal- Nectar Straw Delayed swallow initiation-pyriform sinuses;Penetration/Aspiration before swallow;Reduced epiglottic inversion;Pharyngeal  residue - valleculae Pharyngeal Material enters airway, CONTACTS cords and then ejected out;Material enters airway, remains ABOVE vocal cords and not ejected out Pharyngeal- Thin Teaspoon Delayed swallow initiation-pyriform sinuses Pharyngeal -- Pharyngeal- Thin Cup Delayed swallow initiation-pyriform sinuses;Penetration/Aspiration during swallow;Penetration/Apiration after swallow;Reduced airway/laryngeal closure;Reduced epiglottic inversion;Trace aspiration;Pharyngeal residue - valleculae Pharyngeal Material enters airway, passes BELOW cords without attempt by patient to eject out (silent aspiration);Material enters airway, passes BELOW cords and not ejected out despite cough attempt by patient Pharyngeal- Thin Straw -- Pharyngeal -- Pharyngeal- Puree Delayed swallow initiation-vallecula;WFL Pharyngeal -- Pharyngeal- Mechanical Soft Delayed swallow initiation-vallecula Pharyngeal -- Pharyngeal- Regular -- Pharyngeal -- Pharyngeal- Multi-consistency -- Pharyngeal -- Pharyngeal- Pill WFL Pharyngeal -- Pharyngeal Comment --  CHL IP CERVICAL ESOPHAGEAL PHASE 08/05/2017 Cervical Esophageal Phase Impaired Pudding Teaspoon -- Pudding Cup -- Honey Teaspoon -- Honey Cup -- Nectar Teaspoon -- Nectar Cup -- Nectar Straw -- Thin Teaspoon -- Thin Cup Prominent cricopharyngeal segment;Esophageal backflow into cervical esophagus;Esophageal backflow into the pharynx Thin Straw -- Puree Prominent cricopharyngeal segment;Esophageal backflow into cervical esophagus Mechanical Soft -- Regular -- Multi-consistency -- Pill -- Cervical Esophageal Comment Prominent CP with posterior pharyngeal wall thickening near UES possibly due to osteophyte and kyphosis; air distention noted below UES Thank you, Havery Moros, CCC-SLP [phone] No flowsheet data found. PORTER,DABNEY 08/05/2017, 2:44 PM                   Scheduled Meds: . amoxicillin-clavulanate  1 tablet Oral Q12H  . carvedilol  12.5 mg Oral BID WC  . Chlorhexidine  Gluconate Cloth  6 each Topical Daily  . diltiazem  60 mg Oral Q6H  . donepezil  10 mg Oral QHS   And  . memantine  28 mg Oral Daily  . feeding supplement (GLUCERNA SHAKE)  237 mL Oral TID BM  . furosemide  20 mg Oral Daily  . ipratropium  0.5 mg Nebulization TID  . levalbuterol  1.25 mg Nebulization TID  . mouth rinse  15 mL Mouth Rinse BID  . multivitamin with minerals  1 tablet Oral Daily  . pantoprazole  40 mg Oral Daily  . sodium chloride flush  10-40 mL Intracatheter Q12H  . Warfarin - Pharmacist Dosing Inpatient   Does  not apply Q24H   Continuous Infusions:    LOS: 7 days    Time spent:    Erick Blinks, MD [facility] Pager [phone]  If 7PM-7AM, please contact night-coverage [email or web address] Password [facility] 08/07/2017, 9:49 AM

## 2017-08-08 ENCOUNTER — Inpatient Hospital Stay (HOSPITAL_COMMUNITY): Payer: Medicare Other

## 2017-08-08 DIAGNOSIS — R0902 Hypoxemia: Secondary | ICD-10-CM

## 2017-08-08 LAB — BASIC METABOLIC PANEL
ANION GAP: 12 (ref 5–15)
BUN: 40 mg/dL — AB (ref 6–20)
CHLORIDE: 101 mmol/L (ref 101–111)
CO2: 31 mmol/L (ref 22–32)
Calcium: 9.5 mg/dL (ref 8.9–10.3)
Creatinine, Ser: 1.06 mg/dL — ABNORMAL HIGH (ref 0.44–1.00)
GFR calc Af Amer: 54 mL/min — ABNORMAL LOW (ref >60)
GFR calc non Af Amer: 46 mL/min — ABNORMAL LOW (ref >60)
GLUCOSE: 153 mg/dL — AB (ref 65–99)
POTASSIUM: 4.3 mmol/L (ref 3.5–5.1)
Sodium: 144 mmol/L (ref 135–145)

## 2017-08-08 LAB — CBC WITH DIFFERENTIAL/PLATELET
BASOS ABS: 0 10*3/uL (ref 0.0–0.1)
Basophils Relative: 0 %
Eosinophils Absolute: 0.1 10*3/uL (ref 0.0–0.7)
Eosinophils Relative: 1 %
HEMATOCRIT: 36.8 % (ref 36.0–46.0)
HEMOGLOBIN: 10.6 g/dL — AB (ref 12.0–15.0)
LYMPHS PCT: 4 %
Lymphs Abs: 0.4 10*3/uL — ABNORMAL LOW (ref 0.7–4.0)
MCH: 28.3 pg (ref 26.0–34.0)
MCHC: 28.8 g/dL — ABNORMAL LOW (ref 30.0–36.0)
MCV: 98.4 fL (ref 78.0–100.0)
MONO ABS: 0.6 10*3/uL (ref 0.1–1.0)
MONOS PCT: 6 %
NEUTROS ABS: 7.7 10*3/uL (ref 1.7–7.7)
NEUTROS PCT: 89 %
Platelets: 158 10*3/uL (ref 150–400)
RBC: 3.74 MIL/uL — ABNORMAL LOW (ref 3.87–5.11)
RDW: 17.4 % — AB (ref 11.5–15.5)
WBC: 8.8 10*3/uL (ref 4.0–10.5)

## 2017-08-08 LAB — PROTIME-INR
INR: 2.15
INR: 2.33
Prothrombin Time: 23.8 seconds — ABNORMAL HIGH (ref 11.4–15.2)
Prothrombin Time: 25.4 seconds — ABNORMAL HIGH (ref 11.4–15.2)

## 2017-08-08 LAB — BLOOD GAS, ARTERIAL
Acid-Base Excess: 4.4 mmol/L — ABNORMAL HIGH (ref 0.0–2.0)
Bicarbonate: 27 mmol/L (ref 20.0–28.0)
Drawn by: 105551
FIO2: 100
O2 SAT: 87 %
pCO2 arterial: 67.8 mmHg (ref 32.0–48.0)
pH, Arterial: 7.279 — ABNORMAL LOW (ref 7.350–7.450)
pO2, Arterial: 62 mmHg — ABNORMAL LOW (ref 83.0–108.0)

## 2017-08-08 LAB — GLUCOSE, CAPILLARY: GLUCOSE-CAPILLARY: 226 mg/dL — AB (ref 65–99)

## 2017-08-08 LAB — COMPREHENSIVE METABOLIC PANEL
ALBUMIN: 2.8 g/dL — AB (ref 3.5–5.0)
ALK PHOS: 75 U/L (ref 38–126)
ALT: 18 U/L (ref 14–54)
AST: 19 U/L (ref 15–41)
Anion gap: 13 (ref 5–15)
BILIRUBIN TOTAL: 0.8 mg/dL (ref 0.3–1.2)
BUN: 44 mg/dL — AB (ref 6–20)
CALCIUM: 9.4 mg/dL (ref 8.9–10.3)
CO2: 30 mmol/L (ref 22–32)
CREATININE: 1.38 mg/dL — AB (ref 0.44–1.00)
Chloride: 102 mmol/L (ref 101–111)
GFR calc Af Amer: 39 mL/min — ABNORMAL LOW (ref >60)
GFR, EST NON AFRICAN AMERICAN: 34 mL/min — AB (ref >60)
GLUCOSE: 243 mg/dL — AB (ref 65–99)
POTASSIUM: 5.1 mmol/L (ref 3.5–5.1)
Sodium: 145 mmol/L (ref 135–145)
TOTAL PROTEIN: 6.4 g/dL — AB (ref 6.5–8.1)

## 2017-08-08 LAB — URINALYSIS, ROUTINE W REFLEX MICROSCOPIC
Bacteria, UA: NONE SEEN
Bilirubin Urine: NEGATIVE
GLUCOSE, UA: NEGATIVE mg/dL
HGB URINE DIPSTICK: NEGATIVE
Ketones, ur: NEGATIVE mg/dL
LEUKOCYTES UA: NEGATIVE
NITRITE: NEGATIVE
Protein, ur: 30 mg/dL — AB
SPECIFIC GRAVITY, URINE: 1.016 (ref 1.005–1.030)
pH: 5 (ref 5.0–8.0)

## 2017-08-08 LAB — APTT: aPTT: 41 seconds — ABNORMAL HIGH (ref 24–36)

## 2017-08-08 LAB — LACTIC ACID, PLASMA: Lactic Acid, Venous: 2.5 mmol/L (ref 0.5–1.9)

## 2017-08-08 LAB — TROPONIN I

## 2017-08-08 MED ORDER — LEVALBUTEROL HCL 1.25 MG/0.5ML IN NEBU: 1.2500 mg | INHALATION_SOLUTION | Freq: Four times a day (QID) | RESPIRATORY_TRACT | Status: DC

## 2017-08-08 MED ORDER — IPRATROPIUM BROMIDE 0.02 % IN SOLN
RESPIRATORY_TRACT | Status: AC
  Administered 2017-08-08: 0.5 mg via RESPIRATORY_TRACT
  Filled 2017-08-08: qty 2.5

## 2017-08-08 MED ORDER — WARFARIN SODIUM 2 MG PO TABS
2.0000 mg | ORAL_TABLET | Freq: Once | ORAL | Status: AC
  Administered 2017-08-08: 2 mg via ORAL
  Filled 2017-08-08: qty 1

## 2017-08-08 MED ORDER — SODIUM CHLORIDE 0.9 % IV SOLN
INTRAVENOUS | Status: AC
  Filled 2017-08-08 (×2): qty 3

## 2017-08-08 MED ORDER — LEVALBUTEROL HCL 1.25 MG/0.5ML IN NEBU
INHALATION_SOLUTION | RESPIRATORY_TRACT | Status: AC
  Administered 2017-08-08: 1.25 mg via RESPIRATORY_TRACT
  Filled 2017-08-08: qty 0.5

## 2017-08-08 MED ORDER — IPRATROPIUM BROMIDE 0.02 % IN SOLN
0.5000 mg | Freq: Four times a day (QID) | RESPIRATORY_TRACT | Status: DC
  Administered 2017-08-09 – 2017-08-13 (×17): 0.5 mg via RESPIRATORY_TRACT
  Filled 2017-08-08 (×18): qty 2.5

## 2017-08-08 MED ORDER — SODIUM CHLORIDE 0.9 % IV SOLN
3.0000 g | Freq: Four times a day (QID) | INTRAVENOUS | Status: DC
  Administered 2017-08-08 – 2017-08-09 (×2): 3 g via INTRAVENOUS
  Filled 2017-08-08 (×3): qty 3

## 2017-08-08 NOTE — Progress Notes (Signed)
Night shift floor coverage note.  The patient was seen after a code blue was called. The patient was found having agonal breathing. She was pale and her oxygen saturation was in the 60's. She was having dinner and apparently aspirated. She was Ambu-bagged and aspirated then coughed multiple times improving significantly with her O2 sat eventually improving to the mid to high nighties. Her mental status improved from obtunded to sleepy. She was able to answer simple questions and knew she was in the hospital.  Her blood pressure movement was 96/62 mmHg, pulse 64, respiratory rate 24 and she was afebrile.  Temperature taken later was 97.1 F.  HEENT: Normocephalic, oxygen mask on, mucous membranes are moist. Neck: Supple, no JVD. Lungs: Decreased breath sounds bilaterally with mild wheezing and rhonchi. Cardiovascular: S1-S2, RRR,  Abdomen: Soft, nontender Extremities: No edema, no cyanosis.  An ABG was done which showed:  pH of 7.28 PCO2 67.8 mmHg PO2 62.0 mmHg O2 sat was 87% Bicarbonate 27 millimolar/L Base excess 4.4 mmol/L.        Updated: 08/08/17 2036 Collected: 08/08/17 1948   Lactic Acid, Venous 2.5 Critically high   mmol/L   Comprehensive metabolic panel [[id number]][[id number]] (Abnormal)   Collected: 08/08/17 1948  Updated: 08/08/17 2028    Sodium 145 mmol/L   Potassium 5.1 mmol/L   Chloride 102 mmol/L   CO2 30 mmol/L   Glucose, Bld 243 Abnormally high  mg/dL   BUN 44 Abnormally high  mg/dL   Creatinine, Ser 4.091.38 Abnormally high  mg/dL   Calcium 9.4 mg/dL   Total Protein 6.4 Abnormally low  g/dL   Albumin 2.8 Abnormally low  g/dL   AST 19 U/L   ALT 18 U/L   Alkaline Phosphatase 75 U/L   Total Bilirubin 0.8 mg/dL   GFR calc non Af Amer 34 Abnormally low  mL/min   GFR calc Af Amer 39 Abnormally low  mL/min   Anion gap 13   Troponin I [[id number]][[id number]] Collected: 08/08/17 1948  Updated: 08/08/17 2028    Troponin I <0.03 ng/mL    APTT [[id number]][[id number]] (Abnormal)  Collected: 08/08/17  1948  Updated: 08/08/17 2016    aPTT 41 Abnormally high  seconds  Protime-INR [[id number]][[id number]] (Abnormal) Collected: 08/08/17 1948  Updated: 08/08/17 2016    Prothrombin Time 25.4 Abnormally high  seconds   INR 2.33   CBC with Differential/Platelet [[id number]][[id number]] (Abnormal)   Collected: 08/08/17 1948  Updated: 08/08/17 2006    WBC 8.8 K/uL   RBC 3.74 Abnormally low  MIL/uL   Hemoglobin 10.6 Abnormally low  g/dL   HCT 01.036.8 %   MCV 27.298.4 fL   MCH 28.3 pg   MCHC 28.8 Abnormally low  g/dL   RDW 53.617.4 Abnormally high  %   Platelets 158 K/uL   Neutrophils Relative % 89 %   Neutro Abs 7.7 K/uL   Lymphocytes Relative 4 %   Lymphs Abs 0.4 Abnormally low  K/uL   Monocytes Relative 6 %   Monocytes Absolute 0.6 K/uL   Eosinophils Relative 1 %   Eosinophils Absolute 0.1 K/uL   Basophils Relative 0 %   Basophils Absolute 0.0 K/uL     Updated: 08/08/17 1937 Collected: 08/08/17 1936   Glucose-Capillary 226 Abnormally high  mg/dL    PORTABLE CHEST 1 VIEW  CLINICAL DATA:  Aspiration.  Acute respiratory arrest.  COMPARISON:  08/02/2017  FINDINGS: Right arm PICC line and pacemaker remain in place. Stable cardiomegaly. Bilateral pulmonary airspace disease shows interval decrease in perihilar regions  bilaterally. Probable small bilateral pleural effusions again noted.  IMPRESSION: Mild decrease in bilateral pulmonary airspace disease. Stable cardiomegaly and probable small bilateral pleural effusions.  10/01/2016 echocardiogram complete ------------------------------------------------------------------- LV EF: 60% -   65%  ------------------------------------------------------------------- Indications:      Atrial fibrillation - 427.31.  ------------------------------------------------------------------- History:   PMH:  PFO, Bradycardia.  Atrial fibrillation.  Risk factors:  Hypertension.  ------------------------------------------------------------------- Study  Conclusions  - Left ventricle: The cavity size was normal. Wall thickness was   increased in a pattern of mild LVH. Systolic function was normal.   The estimated ejection fraction was in the range of 60% to 65%.   Doppler parameters are consistent with restrictive physiology,   indicative of decreased left ventricular diastolic compliance   and/or increased left atrial pressure. Doppler parameters are   consistent with high ventricular filling pressure. - Regional wall motion abnormality: Mild hypokinesis of the mid   inferior and mid inferolateral myocardium. - Aortic valve: There was mild to moderate stenosis. Peak velocity   (S): 202 cm/s. Mean gradient (S): 9 mm Hg. Valve area (Vmax):   0.77 cm^2. - Mitral valve: Mildly calcified annulus. Mildly calcified leaflets   . The findings are consistent with mild stenosis. There was   moderate regurgitation. Mean gradient (D): 2 mm Hg. - Left atrium: The atrium was severely dilated. - Right ventricle: Systolic function was moderately reduced. - Right atrium: The atrium was mildly to moderately dilated. - Tricuspid valve: There was mild-moderate regurgitation. - Pulmonary arteries: PA peak pressure: 59 mm Hg (S).  Assessment and plan:  Hypoxia Aspiration pneumonia  Transfer to the ICU. Continue oxygen supplementation.  Taper off as possible. Keep n.p.o. for now Continue bronchodilators. Switch to Augmentin to Unasyn. Discussed with her husband, daughter and caregiver.  They would like to continue all measures for the moment. Should the patient need ET intubation, this will be short-term and reevaluation of risks, benefits and progress will be made after 2-3 days. I discussed with them the possibility of needing a PEG tube to avoid aspiration, but also to improve nutrition.  They will be thinking about this possibility.  Sanda Klein, MD  About 65 minutes of critical care time were used during this emergency.  This document was  prepared using Dragon voice recognition software and may contain some unintended dictation errors.

## 2017-08-08 NOTE — Progress Notes (Signed)
ANTICOAGULATION CONSULT NOTE   Pharmacy Consult for coumadin Indication: atrial fibrillation  No Known Allergies  Patient Measurements: Height: 5\' 6"  (167.6 cm) Weight: 127 lb 6.8 oz (57.8 kg) IBW/kg (Calculated) : 59.3  Vital Signs: Temp: 97.3 F (36.3 C) (12/30 0452) Temp Source: Oral (12/30 0452) BP: 159/80 (12/30 0452) Pulse Rate: 83 (12/30 0452)  Labs: Recent Labs    08/06/17 0552 08/07/17 0445 08/08/17 0613  HGB  --  10.5*  --   HCT  --  35.5*  --   PLT  --  137*  --   LABPROT 22.7* 24.8* 23.8*  INR 2.02 2.26 2.15  CREATININE 1.22* 1.22* 1.06*   Estimated Creatinine Clearance: 34.8 mL/min (A) (by C-G formula based on SCr of 1.06 mg/dL (H)).  Medications:  Was on coumadin 2mg  po daily PTA  Assessment: 83 yo lady to continue coumadin for afib. Admission INR was therapeutic. No bleeding noted.  May need lower doses than home dose while on antibiotics and poor appetite.   INR today is therapeutic.   No bleeding reported.   Goal of Therapy:  INR 2-3 Monitor platelets by anticoagulation protocol: Yes   Plan:  Coumadin 2mg  today x 1 Daily PT/INR Monitor for bleeding complications  [name], PharmD Clinical Pharmacist Pager:  [phone] 08/08/2017   08/08/2017,10:15 AM

## 2017-08-08 NOTE — Progress Notes (Signed)
abg has been drawn , gas results given to md . Results or order not in computer as of yet

## 2017-08-08 NOTE — Progress Notes (Signed)
Responded to code blue. Pt unresponsive. Prepared for intubation. Pt bagged by Molly Maduro, RT. Pt became responsive. Coughing small amounts of pale colored material. NRB placed. O2 saturation of 96%. Robert, RT assumed care of pt.

## 2017-08-08 NOTE — Progress Notes (Signed)
Subjective: She was admitted with pneumonia and acute on chronic hypoxic respiratory failure.  She is aspirating.  She has improved.  Her oxygen requirements had increased and she is now down to 4 L nasal cannula.  She has no complaints  Objective: Vital signs in last 24 hours: Temp:  [97.3 F (36.3 C)-98.6 F (37 C)] 97.3 F (36.3 C) (12/30 0452) Pulse Rate:  [56-106] 83 (12/30 0452) Resp:  [12-21] 18 (12/30 0452) BP: (136-180)/(66-97) 159/80 (12/30 0452) SpO2:  [91 %-98 %] 91 % (12/30 0452) Weight change:  Last BM Date: 08/07/17  Intake/Output from previous day: 12/29 0701 - 12/30 0700 In: 120 [P.O.:120] Out: -   PHYSICAL EXAM General appearance: alert, cooperative and no distress Resp: Her lungs are clear now Cardio: irregularly irregular rhythm GI: soft, non-tender; bowel sounds normal; no masses,  no organomegaly Extremities: extremities normal, atraumatic, no cyanosis or edema Skin warm and dry  Lab Results:  Results for orders placed or performed during the hospital encounter of 07/11/2017 (from the past 48 hour(s))  Protime-INR     Status: Abnormal   Collection Time: 08/07/17  4:45 AM  Result Value Ref Range   Prothrombin Time 24.8 (H) 11.4 - 15.2 seconds   INR 7.03   Basic metabolic panel     Status: Abnormal   Collection Time: 08/07/17  4:45 AM  Result Value Ref Range   Sodium 145 135 - 145 mmol/L   Potassium 3.4 (L) 3.5 - 5.1 mmol/L   Chloride 101 101 - 111 mmol/L   CO2 32 22 - 32 mmol/L   Glucose, Bld 133 (H) 65 - 99 mg/dL   BUN 37 (H) 6 - 20 mg/dL   Creatinine, Ser 1.22 (H) 0.44 - 1.00 mg/dL   Calcium 9.0 8.9 - 10.3 mg/dL   GFR calc non Af Amer 39 (L) >60 mL/min   GFR calc Af Amer 45 (L) >60 mL/min    Comment: (NOTE) The eGFR has been calculated using the CKD EPI equation. This calculation has not been validated in all clinical situations. eGFR's persistently <60 mL/min signify possible Chronic Kidney Disease.    Anion gap 12 5 - 15  CBC     Status:  Abnormal   Collection Time: 08/07/17  4:45 AM  Result Value Ref Range   WBC 8.7 4.0 - 10.5 K/uL   RBC 3.69 (L) 3.87 - 5.11 MIL/uL   Hemoglobin 10.5 (L) 12.0 - 15.0 g/dL   HCT 35.5 (L) 36.0 - 46.0 %   MCV 96.2 78.0 - 100.0 fL   MCH 28.5 26.0 - 34.0 pg   MCHC 29.6 (L) 30.0 - 36.0 g/dL   RDW 17.0 (H) 11.5 - 15.5 %   Platelets 137 (L) 150 - 400 K/uL  Protime-INR     Status: Abnormal   Collection Time: 08/08/17  6:13 AM  Result Value Ref Range   Prothrombin Time 23.8 (H) 11.4 - 15.2 seconds   INR 2.15     ABGS No results for input(s): PHART, PO2ART, TCO2, HCO3 in the last 72 hours.  Invalid input(s): PCO2 CULTURES Recent Results (from the past 240 hour(s))  Urine Culture     Status: Abnormal   Collection Time: 07/20/2017  9:26 AM  Result Value Ref Range Status   Specimen Description URINE, CATHETERIZED  Final   Special Requests NONE  Final   Culture >=100,000 COLONIES/mL SERRATIA MARCESCENS (A)  Final   Report Status 08/02/2017 FINAL  Final   Organism ID, Bacteria SERRATIA MARCESCENS (  A)  Final      Susceptibility   Serratia marcescens - MIC*    CEFAZOLIN >=64 RESISTANT Resistant     CEFTRIAXONE <=1 SENSITIVE Sensitive     CIPROFLOXACIN <=0.25 SENSITIVE Sensitive     GENTAMICIN <=1 SENSITIVE Sensitive     NITROFURANTOIN 128 RESISTANT Resistant     TRIMETH/SULFA <=20 SENSITIVE Sensitive     * >=100,000 COLONIES/mL SERRATIA MARCESCENS  Blood Culture (routine x 2)     Status: None   Collection Time: 08/02/2017  9:34 AM  Result Value Ref Range Status   Specimen Description   Final    BLOOD RIGHT ANTECUBITAL Blood Culture adequate volume   Special Requests BOTTLES DRAWN AEROBIC AND ANAEROBIC  Final   Culture NO GROWTH 5 DAYS  Final   Report Status 08/05/2017 FINAL  Final  Blood Culture (routine x 2)     Status: None   Collection Time: 07/27/2017  9:45 AM  Result Value Ref Range Status   Specimen Description   Final    BLOOD BLOOD LEFT ARM IV BOTTLES DRAWN AEROBIC AND ANAEROBIC    Special Requests Blood Culture adequate volume  Final   Culture NO GROWTH 5 DAYS  Final   Report Status 08/05/2017 FINAL  Final  MRSA PCR Screening     Status: None   Collection Time: 08/09/2017  2:03 PM  Result Value Ref Range Status   MRSA by PCR NEGATIVE NEGATIVE Final    Comment:        The GeneXpert MRSA Assay (FDA approved for NASAL specimens only), is one component of a comprehensive MRSA colonization surveillance program. It is not intended to diagnose MRSA infection nor to guide or monitor treatment for MRSA infections.   C difficile quick scan w PCR reflex     Status: None   Collection Time: 08/05/17  5:18 PM  Result Value Ref Range Status   C Diff antigen NEGATIVE NEGATIVE Final   C Diff toxin NEGATIVE NEGATIVE Final   C Diff interpretation No C. difficile detected.  Final   Studies/Results: No results found.  Medications:  Prior to Admission:  Medications Prior to Admission  Medication Sig Dispense Refill Last Dose  . ALPRAZolam (XANAX) 0.5 MG tablet Take 0.5 mg by mouth at bedtime as needed for anxiety or sleep.   07/30/2017 at 1930  . Biotin 5000 MCG TABS Take by mouth daily. 10,000 mcg daily   07/30/2017 at Unknown time  . Calcium Carbonate-Vitamin D (CALTRATE 600+D PO) Take 1 tablet by mouth 2 (two) times daily.    07/30/2017 at Unknown time  . carvedilol (COREG) 12.5 MG tablet Take 6.25-12.5 mg by mouth 2 (two) times daily with a meal. Take 12.5 mg in the morning and 6.25 mg in the evening.   07/12/2017 at 0800  . Cyanocobalamin (VITAMIN B-12) 5000 MCG TBDP Take 1 tablet by mouth daily.   07/30/2017 at Unknown time  . diltiazem (CARDIZEM) 90 MG tablet Take 1 tablet (90 mg total) by mouth every evening.   08/01/2017 at 0800  . donepezil (ARICEPT) 10 MG tablet Take 1 tablet by mouth at bedtime.  6 07/30/2017 at Unknown time  . feeding supplement, GLUCERNA SHAKE, (GLUCERNA SHAKE) LIQD Take 237 mLs by mouth 3 (three) times daily between meals.   07/30/2017 at  Unknown time  . furosemide (LASIX) 20 MG tablet Take 1 tablet (20 mg total) by mouth daily.   07/17/2017 at 0800  . HYDROcodone-acetaminophen (NORCO/VICODIN) 5-325 MG tablet Take 1 tablet  by mouth every 8 (eight) hours as needed for pain.  0 07/30/2017 at Unknown time  . mirtazapine (REMERON) 15 MG tablet Take 1 tablet by mouth at bedtime.  5 07/30/2017 at Unknown time  . Multiple Vitamin (MULTIVITAMIN) tablet Take 1 tablet by mouth daily.    07/30/2017 at Unknown time  . NAMZARIC 28-10 MG CP24 take one capsule by mouth once daily  12 07/30/2017 at Unknown time  . omeprazole (PRILOSEC) 20 MG capsule Take 20 mg by mouth daily.   07/30/2017 at Unknown time  . potassium chloride (K-DUR) 10 MEQ tablet Take 1 tablet (10 mEq total) by mouth daily. 30 tablet 0 07/30/2017 at Unknown time  . warfarin (COUMADIN) 2 MG tablet Take 2 mg by mouth daily at 6 PM.   07/30/2017 at 1930   Scheduled: . amoxicillin-clavulanate  1 tablet Oral Q12H  . carvedilol  12.5 mg Oral BID WC  . Chlorhexidine Gluconate Cloth  6 each Topical Daily  . diltiazem  60 mg Oral Q6H  . donepezil  10 mg Oral QHS  . feeding supplement (GLUCERNA SHAKE)  237 mL Oral TID BM  . furosemide  20 mg Oral Daily  . ipratropium  0.5 mg Nebulization TID  . levalbuterol  1.25 mg Nebulization TID  . mouth rinse  15 mL Mouth Rinse BID  . memantine  20 mg Oral QHS  . multivitamin with minerals  1 tablet Oral Daily  . pantoprazole sodium  40 mg Per Tube Daily  . sodium chloride flush  10-40 mL Intracatheter Q12H  . Warfarin - Pharmacist Dosing Inpatient   Does not apply Q24H   Continuous:  CWC:[id number] **OR** acetaminophen, haloperidol lactate, ondansetron **OR** ondansetron (ZOFRAN) IV, RESOURCE THICKENUP CLEAR, sodium chloride flush  Assesment: She was admitted with pneumonia and acute on chronic hypoxic respiratory failure.  Her modified barium swallow shows that she aspirates asymptomatically and I think that is the source of her  pneumonia.  She is unaware of food in her trachea.  She was septic on admission and that is better.  She has had some element of heart failure and that is better.  She has chronic atrial fib which is about the same.  She had pretty severe metabolic encephalopathy on admission and that has resolved she is back to her baseline mental status I believe. Active Problems:   Acute renal failure superimposed on stage 3 chronic kidney disease (HCC)   Sepsis (HCC)   Sepsis due to undetermined organism (HCC)   Chronic diastolic CHF (congestive heart failure) (HCC)   Acute on chronic respiratory failure with hypoxia (HCC)   Acute metabolic encephalopathy   Lobar pneumonia (HCC)   Permanent atrial fibrillation (HCC)   Dementia without behavioral disturbance    Plan: I will plan to follow more peripherally.  Plans are for her to go to skilled care facility but this morning her caretaker at bedside said she was not sure the family would agree to that    LOS: 8 days   Seab Axel L 08/08/2017, 8:23 AM

## 2017-08-08 NOTE — Progress Notes (Signed)
PROGRESS NOTE    [name]  WUJ:[id number] DOB: 07/22/1929 DOA: August 30, 2017 PCP: Ignatius Specking, MD     Brief Narrative:  83 year old woman admitted from home on 12/22 due to cough and generalized weakness.  She was found to have aspiration pneumonia and significant signs of aspiration on modified barium swallow.  She also developed A. fib with RVR while hospitalized.  She was seen by physical therapy and SNF was recommended.  Discussed with husband on 12/30 and he is not willing to pursue SNF and wants to take patient home when she is medically cleared.   Assessment & Plan: Sepsis - Active Problems:   Acute renal failure superimposed on stage 3 chronic kidney disease (HCC)   Sepsis (HCC)   Sepsis due to undetermined organism (HCC)   Chronic diastolic CHF (congestive heart failure) (HCC)   Acute on chronic respiratory failure with hypoxia (HCC)   Acute metabolic encephalopathy   Lobar pneumonia (HCC)   Permanent atrial fibrillation (HCC)   Dementia without behavioral disturbance   Sepsis -Secondary to aspiration pneumonia. -Sepsis parameters have resolved. -Was initially placed on Zosyn which has been transitioned over to Augmentin which she will continue for a total of 10 days on discharge.  Dysphagia -Underwent MBS with speech therapy and noted to have delay in initiation of swallow as well as silent aspiration.  She has been put on a dysphagia 2 diet with nectar thick liquids.  Acute on chronic respiratory failure with hypoxemia -Related to pneumonia as well as aspiration. -She is currently requiring 3-4 L of nasal cannula, will wean as tolerated in anticipation for discharge home in 24-48 hours.  Acute on chronic kidney disease stage III -Creatinine is down to baseline of 1-1.1 on discharge.  UTI -Urine culture positive for Serratia, adequately treated with IV Zosyn.  Acute metabolic encephalopathy -Suspect related to sepsis. -She also has baseline  dementia. -She remains a bit drowsy but is arousable and able to answer questions appropriately, per husband she is very close to baseline.  Atrial fibrillation with rapid ventricular response -Likely exacerbated by respiratory illness. -Did initially require IV Cardizem infusion as well as IV metoprolol to control heart rate.  She has since been transitioned over to Coreg and oral diltiazem with improvement in heart rate. -She is chronically anticoagulated on Coumadin.  Acute on chronic diastolic heart failure -Likely precipitated by IV hydration for pneumonia, treated with IV Lasix, volume status appears close to euvolemic, will transition oral Lasix on 12/29. -Echo in February 2018 showed an ejection fraction of 60-65% with decreased left ventricular diastolic compliance.   DVT prophylaxis: Coumadin Code Status: Remains full code, discussed with husband Family Communication: Has been at bedside updated on plan of care all questions answered Disposition Plan: SNF has been recommended by physical therapy, however patient's husband is adamant that he will take her home.  They have private duty sitters and are amenable to home health services.  Consultants:   Pulmonary, Dr. Juanetta Gosling  Procedures:   None  Antimicrobials:  Anti-infectives (From admission, onward)   Start     Dose/Rate Route Frequency Ordered Stop   08/06/17 1400  amoxicillin-clavulanate (AUGMENTIN) 875-125 MG per tablet 1 tablet     1 tablet Oral Every 12 hours 08/06/17 1311     08/01/17 1000  azithromycin (ZITHROMAX) 500 mg in dextrose 5 % 250 mL IVPB  Status:  Discontinued     500 mg 250 mL/hr over 60 Minutes Intravenous Every 24 hours 08/30/17 0943 08/04/17  1024   08/01/17 1000  cefTRIAXone (ROCEPHIN) 1 g in dextrose 5 % 50 mL IVPB  Status:  Discontinued     1 g 100 mL/hr over 30 Minutes Intravenous Every 24 hours 08-22-17 0943 08-22-17 1411   2017-08-22 1415  azithromycin (ZITHROMAX) 500 mg in dextrose 5 % 250 mL  IVPB  Status:  Discontinued     500 mg 250 mL/hr over 60 Minutes Intravenous  Once August 22, 2017 1403 Aug 22, 2017 1409   2017-08-22 1415  piperacillin-tazobactam (ZOSYN) IVPB 3.375 g  Status:  Discontinued     3.375 g 12.5 mL/hr over 240 Minutes Intravenous Every 8 hours 08-22-2017 1403 08/06/17 1311   08/22/17 1045  cefTRIAXone (ROCEPHIN) 1 g in dextrose 5 % 50 mL IVPB     1 g 100 mL/hr over 30 Minutes Intravenous  Once 2017-08-22 1033 08/22/2017 1158   08-22-17 0930  cefTRIAXone (ROCEPHIN) 1 g in dextrose 5 % 50 mL IVPB     1 g 100 mL/hr over 30 Minutes Intravenous  Once 08-22-2017 0927 Aug 22, 2017 1043   22-Aug-2017 0930  azithromycin (ZITHROMAX) 500 mg in dextrose 5 % 250 mL IVPB     500 mg 250 mL/hr over 60 Minutes Intravenous  Once 08/22/17 0927 08-22-2017 1157       Subjective: Lying in bed, opens eyes to voice, can answer questions, is very drowsy and quickly falls back to sleep mid conversation  Objective: Vitals:   08/07/17 2210 08/08/17 0452 08/08/17 0908 08/08/17 1400  BP:  (!) 159/80  131/75  Pulse:  83  70  Resp:  18  18  Temp:  (!) 97.3 F (36.3 C)  97.6 F (36.4 C)  TempSrc:  Oral    SpO2: 98% 91% 90% 90%  Weight:      Height:        Intake/Output Summary (Last 24 hours) at 08/08/2017 1407 Last data filed at 08/08/2017 0900 Gross per 24 hour  Intake 240 ml  Output -  Net 240 ml   Filed Weights   08/01/17 0530 08/03/17 0300 08/07/17 0600  Weight: 56.6 kg (124 lb 12.5 oz) 58 kg (127 lb 13.9 oz) 57.8 kg (127 lb 6.8 oz)    Examination:  General exam: Drowsy, oriented x3 Respiratory system: Coarse bilateral breath sounds, no wheezing Cardiovascular system:R irregular rhythm, not tachycardic Gastrointestinal system: Abdomen is nondistended, soft and nontender. No organomegaly or masses felt. Normal bowel sounds heard. Central nervous system: Alert and oriented. No focal neurological deficits. Extremities: Trace bilateral pitting edema, positive pulses Skin: No rashes, lesions  or ulcers Psychiatry: Unable to fully assess given current mental state    Data Reviewed: I have personally reviewed following labs and imaging studies  CBC: Recent Labs  Lab 08/02/17 0632 08/03/17 0500 08/05/17 0416 08/07/17 0445  WBC 11.1* 10.8* 11.9* 8.7  HGB 11.2* 10.4* 10.9* 10.5*  HCT 38.0 35.3* 36.8 35.5*  MCV 97.9 96.4 94.8 96.2  PLT 161 135* 139* 137*   Basic Metabolic Panel: Recent Labs  Lab 08/04/17 0520 08/05/17 0416 08/06/17 0552 08/07/17 0445 08/08/17 0613  NA 149* 146* 143 145 144  K 2.9* 3.0* 3.8 3.4* 4.3  CL 106 102 101 101 101  CO2 27 30 32 32 31  GLUCOSE 118* 151* 136* 133* 153*  BUN 37* 32* 33* 37* 40*  CREATININE 1.30* 1.12* 1.22* 1.22* 1.06*  CALCIUM 8.8* 8.8* 8.9 9.0 9.5  MG  --  1.6*  --   --   --    GFR:  Estimated Creatinine Clearance: 34.8 mL/min (A) (by C-G formula based on SCr of 1.06 mg/dL (H)). Liver Function Tests: No results for input(s): AST, ALT, ALKPHOS, BILITOT, PROT, ALBUMIN in the last 168 hours. No results for input(s): LIPASE, AMYLASE in the last 168 hours. No results for input(s): AMMONIA in the last 168 hours. Coagulation Profile: Recent Labs  Lab 08/04/17 0520 08/05/17 0416 08/06/17 0552 08/07/17 0445 08/08/17 0613  INR 3.99 2.62 2.02 2.26 2.15   Cardiac Enzymes: No results for input(s): CKTOTAL, CKMB, CKMBINDEX, TROPONINI in the last 168 hours. BNP (last 3 results) No results for input(s): PROBNP in the last 8760 hours. HbA1C: No results for input(s): HGBA1C in the last 72 hours. CBG: No results for input(s): GLUCAP in the last 168 hours. Lipid Profile: No results for input(s): CHOL, HDL, LDLCALC, TRIG, CHOLHDL, LDLDIRECT in the last 72 hours. Thyroid Function Tests: No results for input(s): TSH, T4TOTAL, FREET4, T3FREE, THYROIDAB in the last 72 hours. Anemia Panel: No results for input(s): VITAMINB12, FOLATE, FERRITIN, TIBC, IRON, RETICCTPCT in the last 72 hours. Urine analysis:    Component Value  Date/Time   COLORURINE YELLOW 08/01/2017 0926   APPEARANCEUR HAZY (A) 08/04/2017 0926   LABSPEC 1.016 07/26/2017 0926   PHURINE 5.0 07/23/2017 0926   GLUCOSEU NEGATIVE 07/15/2017 0926   HGBUR NEGATIVE 07/10/2017 0926   BILIRUBINUR NEGATIVE 07/22/2017 0926   KETONESUR NEGATIVE 07/28/2017 0926   PROTEINUR NEGATIVE 08/01/2017 0926   UROBILINOGEN 0.2 01/17/2015 1030   NITRITE NEGATIVE 08/09/2017 0926   LEUKOCYTESUR MODERATE (A) 07/20/2017 0926   Sepsis Labs: @LABRCNTIP (procalcitonin:4,lacticidven:4)  ) Recent Results (from the past 240 hour(s))  Urine Culture     Status: Abnormal   Collection Time: 08/07/2017  9:26 AM  Result Value Ref Range Status   Specimen Description URINE, CATHETERIZED  Final   Special Requests NONE  Final   Culture >=100,000 COLONIES/mL SERRATIA MARCESCENS (A)  Final   Report Status 08/02/2017 FINAL  Final   Organism ID, Bacteria SERRATIA MARCESCENS (A)  Final      Susceptibility   Serratia marcescens - MIC*    CEFAZOLIN >=64 RESISTANT Resistant     CEFTRIAXONE <=1 SENSITIVE Sensitive     CIPROFLOXACIN <=0.25 SENSITIVE Sensitive     GENTAMICIN <=1 SENSITIVE Sensitive     NITROFURANTOIN 128 RESISTANT Resistant     TRIMETH/SULFA <=20 SENSITIVE Sensitive     * >=100,000 COLONIES/mL SERRATIA MARCESCENS  Blood Culture (routine x 2)     Status: None   Collection Time: 07/13/2017  9:34 AM  Result Value Ref Range Status   Specimen Description   Final    BLOOD RIGHT ANTECUBITAL Blood Culture adequate volume   Special Requests BOTTLES DRAWN AEROBIC AND ANAEROBIC  Final   Culture NO GROWTH 5 DAYS  Final   Report Status 08/05/2017 FINAL  Final  Blood Culture (routine x 2)     Status: None   Collection Time: 07/14/2017  9:45 AM  Result Value Ref Range Status   Specimen Description   Final    BLOOD BLOOD LEFT ARM IV BOTTLES DRAWN AEROBIC AND ANAEROBIC   Special Requests Blood Culture adequate volume  Final   Culture NO GROWTH 5 DAYS  Final   Report Status 08/05/2017  FINAL  Final  MRSA PCR Screening     Status: None   Collection Time: 07/21/2017  2:03 PM  Result Value Ref Range Status   MRSA by PCR NEGATIVE NEGATIVE Final    Comment:  The GeneXpert MRSA Assay (FDA approved for NASAL specimens only), is one component of a comprehensive MRSA colonization surveillance program. It is not intended to diagnose MRSA infection nor to guide or monitor treatment for MRSA infections.   C difficile quick scan w PCR reflex     Status: None   Collection Time: 08/05/17  5:18 PM  Result Value Ref Range Status   C Diff antigen NEGATIVE NEGATIVE Final   C Diff toxin NEGATIVE NEGATIVE Final   C Diff interpretation No C. difficile detected.  Final         Radiology Studies: No results found.      Scheduled Meds: . amoxicillin-clavulanate  1 tablet Oral Q12H  . carvedilol  12.5 mg Oral BID WC  . Chlorhexidine Gluconate Cloth  6 each Topical Daily  . diltiazem  60 mg Oral Q6H  . donepezil  10 mg Oral QHS  . feeding supplement (GLUCERNA SHAKE)  237 mL Oral TID BM  . furosemide  20 mg Oral Daily  . ipratropium  0.5 mg Nebulization TID  . levalbuterol  1.25 mg Nebulization TID  . mouth rinse  15 mL Mouth Rinse BID  . memantine  20 mg Oral QHS  . multivitamin with minerals  1 tablet Oral Daily  . pantoprazole sodium  40 mg Per Tube Daily  . sodium chloride flush  10-40 mL Intracatheter Q12H  . warfarin  2 mg Oral Once  . Warfarin - Pharmacist Dosing Inpatient   Does not apply Q24H   Continuous Infusions:   LOS: 8 days    Time spent: 25 minutes. Greater than 50% of this time was spent in direct contact with the patient coordinating care.     Chaya Jan, MD [facility] Pager [phone]  If 7PM-7AM, please contact night-coverage [email or web address] Password [email or web address] 08/08/2017, 2:07 PM

## 2017-08-09 ENCOUNTER — Encounter (HOSPITAL_COMMUNITY): Payer: Self-pay | Admitting: Primary Care

## 2017-08-09 DIAGNOSIS — T17908A Unspecified foreign body in respiratory tract, part unspecified causing other injury, initial encounter: Secondary | ICD-10-CM

## 2017-08-09 DIAGNOSIS — Z7189 Other specified counseling: Secondary | ICD-10-CM

## 2017-08-09 DIAGNOSIS — Z515 Encounter for palliative care: Secondary | ICD-10-CM

## 2017-08-09 LAB — GLUCOSE, CAPILLARY
GLUCOSE-CAPILLARY: 130 mg/dL — AB (ref 65–99)
GLUCOSE-CAPILLARY: 131 mg/dL — AB (ref 65–99)
GLUCOSE-CAPILLARY: 156 mg/dL — AB (ref 65–99)
GLUCOSE-CAPILLARY: 253 mg/dL — AB (ref 65–99)

## 2017-08-09 LAB — LACTIC ACID, PLASMA: Lactic Acid, Venous: 1.2 mmol/L (ref 0.5–1.9)

## 2017-08-09 LAB — HEMOGLOBIN A1C
Hgb A1c MFr Bld: 6.3 % — ABNORMAL HIGH (ref 4.8–5.6)
Mean Plasma Glucose: 134.11 mg/dL

## 2017-08-09 LAB — PROTIME-INR
INR: 2.48
Prothrombin Time: 26.6 seconds — ABNORMAL HIGH (ref 11.4–15.2)

## 2017-08-09 MED ORDER — LEVALBUTEROL HCL 1.25 MG/0.5ML IN NEBU
1.2500 mg | INHALATION_SOLUTION | Freq: Four times a day (QID) | RESPIRATORY_TRACT | Status: DC
  Administered 2017-08-09 – 2017-08-13 (×17): 1.25 mg via RESPIRATORY_TRACT
  Filled 2017-08-09 (×18): qty 0.5

## 2017-08-09 MED ORDER — WARFARIN SODIUM 2 MG PO TABS
2.0000 mg | ORAL_TABLET | Freq: Once | ORAL | Status: AC
  Administered 2017-08-09: 2 mg via ORAL
  Filled 2017-08-09: qty 1

## 2017-08-09 MED ORDER — SODIUM CHLORIDE 0.9 % IV SOLN
3.0000 g | Freq: Two times a day (BID) | INTRAVENOUS | Status: DC
  Administered 2017-08-09 – 2017-08-12 (×6): 3 g via INTRAVENOUS
  Filled 2017-08-09 (×6): qty 3

## 2017-08-09 MED FILL — Medication: Qty: 1 | Status: AC

## 2017-08-09 NOTE — Progress Notes (Signed)
**Note De-Identified  Obfuscation** RT note: Liter flow increased to 10 on HFNC due to SAT at 90%.  Patient SATS have slow recovery time followings HHN with SAT in low 80s.  Discussion with step daughter to maintain bed at 30 degrees while resting and 90 degrees while eating.  RRT to continue to monitor.

## 2017-08-09 NOTE — Consult Note (Signed)
Consultation Note Date: 08/09/2017    Patient Name: [name]  DOB: 07/01/1929  MRN: [id number]  Age / Sex: 83 y.o., female  PCP: Karen Specking, MD Referring Physician: Philip Conway, [name]*  Reason for Consultation: Establishing goals of care and Psychosocial/spiritual support  HPI/Patient Profile: 83 y.o. female  with past medical history of chronic respiratory failure on 2 L home oxygen, dementia, symptomatic bradycardia with pacemaker, permanent A. fib, diastolic heart failure, stage III kidney disease, coronary artery disease, GERD, high blood pressure admitted on Aug 05, 2017 with sepsis secondary to pneumonia related to aspiration.   Clinical Assessment and Goals of Care: [name] is resting quietly in bed.  She opens her eyes, making and keeping eye contact when I call her name.  She is able to tell me where we are, but not the month or her caregivers name.  She has no complaints at this time, and states she feels okay.  There is no family at bedside at this time.  Caregiver states that husband, [name], is scheduled to arrive at 5 PM today.  Call to husband [name].  Left detailed voicemail message related to current treatment plan, request for face-to-face meeting, palliative being an extra layer support.  I share that I will reach out to [name] tomorrow.  Healthcare power of attorney NEXT OF KIN -husband [name]   SUMMARY OF RECOMMENDATIONS   At this point, full scope treatment. Continue CODE STATUS discussions. Continue to discuss the limitations and risks of PEG tube placement.  Code Status/Advance Care Planning:  Full code  Symptom Management:   Per hospitalist, no additional needs at this time.  Palliative Prophylaxis:   Turn Reposition  Additional Recommendations (Limitations, Scope, Preferences):  Full Scope Treatment  Psycho-social/Spiritual:   Desire  for further Chaplaincy support:no  Additional Recommendations: Caregiving  Support/Resources and Education on Hospice  Prognosis:  < 6 months, would not be surprising based on functional status, health history, recurrent aspiration pneumonia.  Discharge Planning: Likely return home.  24-hour caregivers already in place.      Primary Diagnoses: Present on Admission: . Sepsis (HCC)   I have reviewed the medical record, interviewed the patient and family, and examined the patient. The following aspects are pertinent.  Past Medical History:  Diagnosis Date  . Chronic atrial fibrillation (HCC)   . Chronic diastolic CHF (congestive heart failure) (HCC)   . CKD (chronic kidney disease), stage III (HCC)   . Coronary artery disease   . Dementia   . Encounter for long-term (current) use of other medications    COUMADIN THERAPY  . Essential hypertension   . GERD (gastroesophageal reflux disease)   . Lower extremity weakness   . Pericardial effusion    a. Echo 08/2014: mod pericardial effusion.  . Renal artery stenosis (HCC)    a. right renal artery stenosis (1-59% by duplex in 04/2013).  . Sinoatrial node dysfunction (HCC)     SINUS BRADYCARDIA  . Sinus bradycardia    Social History   Socioeconomic History  .  Marital status: Married    Spouse name: [name]  . Number of children: 0  . Years of education: 3212  . Highest education level: None  Social Needs  . Financial resource strain: None  . Food insecurity - worry: None  . Food insecurity - inability: None  . Transportation needs - medical: None  . Transportation needs - non-medical: None  Occupational History  . Occupation: RETIRED    Employer: RETIRED  Tobacco Use  . Smoking status: Never Smoker  . Smokeless tobacco: Never Used  . Tobacco comment: Never smoked  Substance and Sexual Activity  . Alcohol use: No    Alcohol/week: 0.0 oz  . Drug use: No  . Sexual activity: Not Currently  Other Topics Concern  . None    Social History Narrative   Patient lives at home with [name]  Patient is retired. Patient has 12 th grade education.   Right handed.   Caffeine- two three cups of coffee daily.   Family History  Problem Relation Age of Onset  . Heart failure Mother   . High blood pressure Mother   . Cancer Brother   . Cirrhosis Other    Scheduled Meds: . carvedilol  12.5 mg Oral BID WC  . Chlorhexidine Gluconate Cloth  6 each Topical Daily  . diltiazem  60 mg Oral Q6H  . donepezil  10 mg Oral QHS  . feeding supplement (GLUCERNA SHAKE)  237 mL Oral TID BM  . furosemide  20 mg Oral Daily  . ipratropium  0.5 mg Nebulization Q6H  . levalbuterol  1.25 mg Nebulization Q6H  . mouth rinse  15 mL Mouth Rinse BID  . memantine  20 mg Oral QHS  . multivitamin with minerals  1 tablet Oral Daily  . pantoprazole sodium  40 mg Per Tube Daily  . sodium chloride flush  10-40 mL Intracatheter Q12H  . warfarin  2 mg Oral Once  . Warfarin - Pharmacist Dosing Inpatient   Does not apply Q24H   Continuous Infusions: . ampicillin-sulbactam (UNASYN) IV     PRN Meds:.acetaminophen **OR** acetaminophen, haloperidol lactate, ondansetron **OR** ondansetron (ZOFRAN) IV, RESOURCE THICKENUP CLEAR, sodium chloride flush Medications Prior to Admission:  Prior to Admission medications   Medication Sig Start Date End Date Taking? Authorizing Provider  ALPRAZolam Prudy Feeler(XANAX) 0.5 MG tablet Take 0.5 mg by mouth at bedtime as needed for anxiety or sleep.   Yes [provider]  Biotin 5000 MCG TABS Take by mouth daily. 10,000 mcg daily   Yes [provider]  Calcium Carbonate-Vitamin D (CALTRATE 600+D PO) Take 1 tablet by mouth 2 (two) times daily.    Yes [provider]  carvedilol (COREG) 12.5 MG tablet Take 6.25-12.5 mg by mouth 2 (two) times daily with a meal. Take 12.5 mg in the morning and 6.25 mg in the evening.   Yes [provider]  Cyanocobalamin (VITAMIN B-12)  5000 MCG TBDP Take 1 tablet by mouth daily.   Yes [provider]  diltiazem (CARDIZEM) 90 MG tablet Take 1 tablet (90 mg total) by mouth every evening. 02/25/17  Yes Laqueta LindenKoneswaran, Suresh A, MD  donepezil (ARICEPT) 10 MG tablet Take 1 tablet by mouth at bedtime. 08/06/14  Yes [provider]  feeding supplement, GLUCERNA SHAKE, (GLUCERNA SHAKE) LIQD Take 237 mLs by mouth 3 (three) times daily between meals.   Yes [provider]  furosemide (LASIX) 20 MG tablet Take 1 tablet (20 mg total) by  mouth daily. 08/31/16  Yes Laqueta Linden, MD  HYDROcodone-acetaminophen (NORCO/VICODIN) 5-325 MG tablet Take 1 tablet by mouth every 8 (eight) hours as needed for pain. 12/01/16  Yes [provider]  mirtazapine (REMERON) 15 MG tablet Take 1 tablet by mouth at bedtime. 05/31/15  Yes [provider]  Multiple Vitamin (MULTIVITAMIN) tablet Take 1 tablet by mouth daily.    Yes [provider]  [facility] 28-10 MG CP24 take one capsule by mouth once daily 06/16/16  Yes [provider]  omeprazole (PRILOSEC) 20 MG capsule Take 20 mg by mouth daily.   Yes [provider]  potassium chloride (K-DUR) 10 MEQ tablet Take 1 tablet (10 mEq total) by mouth daily. 06/18/15  Yes Laqueta Linden, MD  warfarin (COUMADIN) 2 MG tablet Take 2 mg by mouth daily at 6 PM.   Yes [provider]   No Known Allergies Review of Systems  Unable to perform ROS: Dementia    Physical Exam  Constitutional: No distress.  Appears frail, acutely/chronically ill.  Briefly makes but does not keep eye contact.  Cardiovascular: Normal rate and regular rhythm.  Pulmonary/Chest: Effort normal. No respiratory distress.  Abdominal: Soft. She exhibits no distension.  Neurological: She is alert.  Known dementia  Skin: Skin is warm and dry.  Psychiatric:  Known dementia  Nursing note and vitals reviewed.   Vital Signs: BP (!) 164/93   Pulse 99   Temp 98.2 F  (36.8 C) (Axillary)   Resp 15   Ht 5\' 6"  (1.676 m)   Wt 57.5 kg (126 lb 12.2 oz)   SpO2 90%   BMI 20.46 kg/m  Pain Assessment: No/denies pain   Pain Score: 0-No pain   SpO2: SpO2: 90 % O2 Device:SpO2: 90 % O2 Flow Rate: .O2 Flow Rate (L/min): 10 L/min  IO: Intake/output summary:   Intake/Output Summary (Last 24 hours) at 08/09/2017 1532 Last data filed at 08/09/2017 0950 Gross per 24 hour  Intake 210 ml  Output --  Net 210 ml    LBM: Last BM Date: 08/09/17 Baseline Weight: Weight: 52.2 kg (115 lb) Most recent weight: Weight: 57.5 kg (126 lb 12.2 oz)     Palliative Assessment/Data:   Flowsheet Rows     Most Recent Value  Intake Tab  Referral Department  Hospitalist  Unit at Time of Referral  ICU  Palliative Care Primary Diagnosis  Sepsis/Infectious Disease  Date Notified  08/09/17  Palliative Care Type  New Palliative care  Reason for referral  Clarify Goals of Care  Date of Admission  Aug 05, 2017  Date first seen by Palliative Care  08/09/17  # of days Palliative referral response time  0 Day(s)  # of days IP prior to Palliative referral  9  Clinical Assessment  Palliative Performance Scale Score  30%  Pain Max last 24 hours  Not able to report  Pain Min Last 24 hours  Not able to report  Dyspnea Max Last 24 Hours  Not able to report  Dyspnea Min Last 24 hours  Not able to report  Psychosocial & Spiritual Assessment  Palliative Care Outcomes  Patient/Family meeting held?  Yes  Who was at the meeting?  Patient and caregiver at bedside, call to husband.  Palliative Care Outcomes  Clarified goals of care, Provided psychosocial or spiritual support      Time In: 1340 Time Out: 1410 Time Total: 30 minutes Greater than 50%  of this time was spent counseling and coordinating care  related to the above assessment and plan.  Signed by: Katheran Awe, NP   Please contact Palliative Medicine Team phone at [phone] for questions and concerns.  For individual  provider: See Loretha Stapler

## 2017-08-09 NOTE — Progress Notes (Signed)
ANTICOAGULATION CONSULT NOTE   Pharmacy Consult for coumadin Indication: atrial fibrillation  No Known Allergies  Patient Measurements: Height: 5\' 6"  (167.6 cm) Weight: 126 lb 12.2 oz (57.5 kg) IBW/kg (Calculated) : 59.3  Vital Signs: Temp: 98.2 F (36.8 C) (12/31 1128) Temp Source: Axillary (12/31 1128) BP: 169/102 (12/31 1200) Pulse Rate: 51 (12/31 1200)  Labs: Recent Labs    08/07/17 0445 08/08/17 0613 08/08/17 1948 08/09/17 0404  HGB 10.5*  --  10.6*  --   HCT 35.5*  --  36.8  --   PLT 137*  --  158  --   APTT  --   --  41*  --   LABPROT 24.8* 23.8* 25.4* 26.6*  INR 2.26 2.15 2.33 2.48  CREATININE 1.22* 1.06* 1.38*  --   TROPONINI  --   --  <0.03  --    Estimated Creatinine Clearance: 26.6 mL/min (A) (by C-G formula based on SCr of 1.38 mg/dL (H)).  Medications:  Was on coumadin 2mg  po daily PTA  Assessment: 83 yo lady to continue coumadin for afib. Admission INR was therapeutic. No bleeding noted.  May need lower doses than home dose while on antibiotics and poor appetite.   INR today is therapeutic.   No bleeding reported.   Goal of Therapy:  INR 2-3 Monitor platelets by anticoagulation protocol: Yes   Plan:  Coumadin 2mg  today x 1 Daily PT/INR Monitor for bleeding complications  Elder Cyphers, BS Loura Back, BCPS Clinical Pharmacist Pager [phone]  08/09/2017   08/09/2017,1:12 PM

## 2017-08-09 NOTE — Progress Notes (Signed)
  Speech Language Pathology Treatment: Dysphagia  Patient Details Name: ALEXYSS BOURQUE MRN: [id number] DOB: 10/05/1928 Today's Date: 08/09/2017 Time: 1245-1310 SLP Time Calculation (min) (ACUTE ONLY): 25 min  Assessment / Plan / Recommendation Clinical Impression  Discussed with Dr. Ardyth Harps, and of for SLP to reassess ability for po intake following code blue last night. Pt seen at bedside with home caregiver present who reported that Pt appeared to choke a couple of hours after dinner meal last evening. Pt alert and requesting water upon SLP arrival today. Pt assessed with single ice chips to lubricate oral cavity and gauge po readiness. No overt signs or symptoms aspiration and O2 saturations maintained in the low 90s. Trials progressed to NTL, HTL, and puree via teaspoon. SLP spoke with caregiver at length regarding Pt's poor functional reserve, high O2 demands, and risk for aspiration. Caregiver also inquired about PEG tube and SLP reiterated that while a PEG could offer additional nutritional support, it does not prevent aspiration (from secretions or reflux). Pt seemingly tolerated careful (slow) feeding of puree and honey-thick liquids when Pt fully upright and alert. Visit discussed with Dr. Ardyth Harps and will initiate D1/puree with HTL presented via teaspoon or cup sips with aspiration and reflux precautions. Above to RN. Pt's husband and son are not present at this time. SLP to follow in acute setting.  Continue oral care before and after and po.    HPI HPI: 83 year old female with a history of chronic respiratory failure, dementia, atrial fibrillation and diastolic heart failure, presents to the hospital with generalized weakness and cough.  Found to have possible aspiration pneumonia and admitted to the hospital for further treatments.  Patient developed rapid atrial fibrillation and was started on Cardizem infusion.  She is currently on intravenous antibiotics and IV fluids.       SLP Plan  Continue with current plan of care       Recommendations  Diet recommendations: Dysphagia 1 (puree);Honey-thick liquid Liquids provided via: Cup;Teaspoon Medication Administration: Crushed with puree Supervision: Staff to assist with self feeding;Full supervision/cueing for compensatory strategies Compensations: Slow rate;Small sips/bites;Multiple dry swallows after each bite/sip Postural Changes and/or Swallow Maneuvers: Seated upright 90 degrees;Upright 30-60 min after meal                Oral Care Recommendations: Oral care BID;Staff/trained caregiver to provide oral care Follow up Recommendations: 24 hour supervision/assistance Plan: Continue with current plan of care       Thank you,  Havery Moros, CCC-SLP [phone]                 Barbette Mcglaun 08/09/2017, 1:24 PM

## 2017-08-09 NOTE — Progress Notes (Signed)
Pt High aspiration risk. NPO status being kept. Unable to take PO medications.  Paged Dr. Ardyth Harps about possibly changing PO Meds to IV.  No new orders at this time.

## 2017-08-09 NOTE — Progress Notes (Signed)
SLP Cancellation Note  Patient Details Name: [name] MRN: [id number] DOB: Aug 24, 1928   Cancelled treatment:       Reason Eval/Treat Not Completed: Other (comment); Acknowledge that Pt was transferred to 300 and back to ICU after suspected aspiration event following evening meal. Pt currently NPO. MBSS completed last week with recommendation for D2/NTL. Could consider D1/puree and HTL if MD/family prefer to minimize, but not eliminate risk for aspiration. SLP will discuss with MD.  Thank you,  Havery Moros, CCC-SLP [phone]    PORTER,DABNEY 08/09/2017, 11:13 AM

## 2017-08-09 NOTE — Progress Notes (Signed)
PROGRESS NOTE    [name]  NFA:[id number]:[id number] DOB: 12/02/1928 DOA: 08/09/2017 PCP: Ignatius SpeckingVyas, Dhruv B, MD     Brief Narrative:  83 year old woman admitted from home on 12/22 due to cough and generalized weakness.  She was found to have aspiration pneumonia and significant signs of aspiration on modified barium swallow.  She also developed A. fib with RVR while hospitalized.  She was seen by physical therapy and SNF was recommended.  Discussed with husband on 12/30 and he is not willing to pursue SNF and wants to take patient home when she is medically cleared.  Overnight on 12/30 CODE BLUE was called due to what was perceived to be agonal respirations following an aspiration event.  She was transferred to the ICU, did not require CPR.   Assessment & Plan:   Active Problems:   Acute renal failure superimposed on stage 3 chronic kidney disease (HCC)   Sepsis (HCC)   Sepsis due to undetermined organism (HCC)   Chronic diastolic CHF (congestive heart failure) (HCC)   Acute on chronic respiratory failure with hypoxia (HCC)   Acute metabolic encephalopathy   Lobar pneumonia (HCC)   Permanent atrial fibrillation (HCC)   Dementia without behavioral disturbance   Aspiration into airway   Palliative care encounter   Goals of care, counseling/discussion   Sepsis -Secondary to aspiration pneumonia. -Sepsis parameters have resolved. -Continue Zosyn. -Overnight event noted.  Palliative care discussions with family continue.  Dysphagia -Underwent MBS with speech therapy and noted to have delay in initiation of swallow as well as silent aspiration.   -Seen by speech therapy again today.  Pured diet with honey thick liquids has been recommended.  Acute on chronic respiratory failure with hypoxemia -Related to pneumonia as well as aspiration. -She is currently requiring 3-4 L of nasal cannula.  Acute on chronic kidney disease stage III -Creatinine is down to baseline of 1-1.1 on  discharge.  UTI -Urine culture positive for Serratia, adequately treated with IV Zosyn.  Acute metabolic encephalopathy -Suspect related to sepsis. -She also has baseline dementia. -She remains a bit drowsy but is arousable and able to answer questions appropriately.  Atrial fibrillation with rapid ventricular response -Likely exacerbated by respiratory illness. -Did initially require IV Cardizem infusion as well as IV metoprolol to control heart rate.  She has since been transitioned over to Coreg and oral diltiazem with improvement in heart rate. -She is chronically anticoagulated on Coumadin.  Acute on chronic diastolic heart failure -Likely precipitated by IV hydration for pneumonia, treated with IV Lasix, volume status appears close to euvolemic, will transition oral Lasix on 12/29. -Echo in February 2018 showed an ejection fraction of 60-65% with decreased left ventricular diastolic compliance.   DVT prophylaxis: Coumadin Code Status: Remains full code, discussed with husband Family Communication: Has been at bedside updated on plan of care all questions answered Disposition Plan: SNF has been recommended by physical therapy, however patient's husband is adamant that he will take her home.  They have private duty sitters and are amenable to home health services.  We continue to discuss discharge plans with patient's husband in light of her continued aspiration.  Consultants:   Pulmonary, Dr. Juanetta GoslingHawkins  Procedures:   None  Antimicrobials:  Anti-infectives (From admission, onward)   Start     Dose/Rate Route Frequency Ordered Stop   08/09/17 1559  Ampicillin-Sulbactam (UNASYN) 3 g in sodium chloride 0.9 % 100 mL IVPB     3 g 200 mL/hr over 30 Minutes Intravenous  Every 12 hours 08/09/17 0853     08/08/17 2100  Ampicillin-Sulbactam (UNASYN) 3 g in sodium chloride 0.9 % 100 mL IVPB  Status:  Discontinued     3 g 200 mL/hr over 30 Minutes Intravenous Every 6 hours 08/08/17 2053  08/09/17 0853   08/06/17 1400  amoxicillin-clavulanate (AUGMENTIN) 875-125 MG per tablet 1 tablet  Status:  Discontinued     1 tablet Oral Every 12 hours 08/06/17 1311 08/08/17 2053   08/01/17 1000  azithromycin (ZITHROMAX) 500 mg in dextrose 5 % 250 mL IVPB  Status:  Discontinued     500 mg 250 mL/hr over 60 Minutes Intravenous Every 24 hours 08-22-2017 0943 08/04/17 1024   08/01/17 1000  cefTRIAXone (ROCEPHIN) 1 g in dextrose 5 % 50 mL IVPB  Status:  Discontinued     1 g 100 mL/hr over 30 Minutes Intravenous Every 24 hours 2017-08-22 0943 08/22/17 1411   August 22, 2017 1415  azithromycin (ZITHROMAX) 500 mg in dextrose 5 % 250 mL IVPB  Status:  Discontinued     500 mg 250 mL/hr over 60 Minutes Intravenous  Once 08-22-2017 1403 2017/08/22 1409   August 22, 2017 1415  piperacillin-tazobactam (ZOSYN) IVPB 3.375 g  Status:  Discontinued     3.375 g 12.5 mL/hr over 240 Minutes Intravenous Every 8 hours 08/22/17 1403 08/06/17 1311   August 22, 2017 1045  cefTRIAXone (ROCEPHIN) 1 g in dextrose 5 % 50 mL IVPB     1 g 100 mL/hr over 30 Minutes Intravenous  Once Aug 22, 2017 1033 2017-08-22 1158   August 22, 2017 0930  cefTRIAXone (ROCEPHIN) 1 g in dextrose 5 % 50 mL IVPB     1 g 100 mL/hr over 30 Minutes Intravenous  Once 08-22-2017 0927 22-Aug-2017 1043   08/22/17 0930  azithromycin (ZITHROMAX) 500 mg in dextrose 5 % 250 mL IVPB     500 mg 250 mL/hr over 60 Minutes Intravenous  Once 08-22-2017 0927 22-Aug-2017 1157       Subjective: Lying in bed, opens eyes to voice, can answer questions, is very drowsy and quickly falls back to sleep mid conversation  Objective: Vitals:   08/09/17 1500 08/09/17 1600 08/09/17 1622 08/09/17 1628  BP: (!) 150/65 (!) 141/69 (!) 141/69   Pulse: 65 88 (!) 102 85  Resp: (!) 23 (!) 22  17  Temp:    (!) 97.3 F (36.3 C)  TempSrc:    Oral  SpO2: 91% 91%  91%  Weight:      Height:        Intake/Output Summary (Last 24 hours) at 08/09/2017 1656 Last data filed at 08/09/2017 0950 Gross per 24 hour    Intake 210 ml  Output -  Net 210 ml   Filed Weights   08/07/17 0600 08/08/17 2023 08/09/17 0500  Weight: 57.8 kg (127 lb 6.8 oz) 57.5 kg (126 lb 12.2 oz) 57.5 kg (126 lb 12.2 oz)    Examination:  General exam: Drowsy, oriented x3 Respiratory system: Coarse bilateral breath sounds, no wheezing Cardiovascular system:R irregular rhythm, not tachycardic Gastrointestinal system: Abdomen is nondistended, soft and nontender. No organomegaly or masses felt. Normal bowel sounds heard. Central nervous system: Alert and oriented. No focal neurological deficits. Extremities: Trace bilateral pitting edema, positive pulses Skin: No rashes, lesions or ulcers Psychiatry: Unable to fully assess given current mental state    Data Reviewed: I have personally reviewed following labs and imaging studies  CBC: Recent Labs  Lab 08/03/17 0500 08/05/17 0416 08/07/17 0445 08/08/17 1948  WBC 10.8* 11.9*  8.7 8.8  NEUTROABS  --   --   --  7.7  HGB 10.4* 10.9* 10.5* 10.6*  HCT 35.3* 36.8 35.5* 36.8  MCV 96.4 94.8 96.2 98.4  PLT 135* 139* 137* 158   Basic Metabolic Panel: Recent Labs  Lab 08/05/17 0416 08/06/17 0552 08/07/17 0445 08/08/17 0613 08/08/17 1948  NA 146* 143 145 144 145  K 3.0* 3.8 3.4* 4.3 5.1  CL 102 101 101 101 102  CO2 30 32 32 31 30  GLUCOSE 151* 136* 133* 153* 243*  BUN 32* 33* 37* 40* 44*  CREATININE 1.12* 1.22* 1.22* 1.06* 1.38*  CALCIUM 8.8* 8.9 9.0 9.5 9.4  MG 1.6*  --   --   --   --    GFR: Estimated Creatinine Clearance: 26.6 mL/min (A) (by C-G formula based on SCr of 1.38 mg/dL (H)). Liver Function Tests: Recent Labs  Lab 08/08/17 1948  AST 19  ALT 18  ALKPHOS 75  BILITOT 0.8  PROT 6.4*  ALBUMIN 2.8*   No results for input(s): LIPASE, AMYLASE in the last 168 hours. No results for input(s): AMMONIA in the last 168 hours. Coagulation Profile: Recent Labs  Lab 08/06/17 0552 08/07/17 0445 08/08/17 0613 08/08/17 1948 08/09/17 0404  INR 2.02 2.26  2.15 2.33 2.48   Cardiac Enzymes: Recent Labs  Lab 08/08/17 1948  TROPONINI <0.03   BNP (last 3 results) No results for input(s): PROBNP in the last 8760 hours. HbA1C: Recent Labs    08/08/17 2331  HGBA1C 6.3*   CBG: Recent Labs  Lab 08/08/17 1936 08/09/17 0035 08/09/17 0611 08/09/17 1127  GLUCAP 226* 156* 130* 131*   Lipid Profile: No results for input(s): CHOL, HDL, LDLCALC, TRIG, CHOLHDL, LDLDIRECT in the last 72 hours. Thyroid Function Tests: No results for input(s): TSH, T4TOTAL, FREET4, T3FREE, THYROIDAB in the last 72 hours. Anemia Panel: No results for input(s): VITAMINB12, FOLATE, FERRITIN, TIBC, IRON, RETICCTPCT in the last 72 hours. Urine analysis:    Component Value Date/Time   COLORURINE AMBER (A) 08/08/2017 2153   APPEARANCEUR CLOUDY (A) 08/08/2017 2153   LABSPEC 1.016 08/08/2017 2153   PHURINE 5.0 08/08/2017 2153   GLUCOSEU NEGATIVE 08/08/2017 2153   HGBUR NEGATIVE 08/08/2017 2153   BILIRUBINUR NEGATIVE 08/08/2017 2153   KETONESUR NEGATIVE 08/08/2017 2153   PROTEINUR 30 (A) 08/08/2017 2153   UROBILINOGEN 0.2 01/17/2015 1030   NITRITE NEGATIVE 08/08/2017 2153   LEUKOCYTESUR NEGATIVE 08/08/2017 2153   Sepsis Labs: @LABRCNTIP (procalcitonin:4,lacticidven:4)  ) Recent Results (from the past 240 hour(s))  Urine Culture     Status: Abnormal   Collection Time: 08/04/2017  9:26 AM  Result Value Ref Range Status   Specimen Description URINE, CATHETERIZED  Final   Special Requests NONE  Final   Culture >=100,000 COLONIES/mL SERRATIA MARCESCENS (A)  Final   Report Status 08/02/2017 FINAL  Final   Organism ID, Bacteria SERRATIA MARCESCENS (A)  Final      Susceptibility   Serratia marcescens - MIC*    CEFAZOLIN >=64 RESISTANT Resistant     CEFTRIAXONE <=1 SENSITIVE Sensitive     CIPROFLOXACIN <=0.25 SENSITIVE Sensitive     GENTAMICIN <=1 SENSITIVE Sensitive     NITROFURANTOIN 128 RESISTANT Resistant     TRIMETH/SULFA <=20 SENSITIVE Sensitive     *  >=100,000 COLONIES/mL SERRATIA MARCESCENS  Blood Culture (routine x 2)     Status: None   Collection Time: 07/23/2017  9:34 AM  Result Value Ref Range Status   Specimen Description   Final  BLOOD RIGHT ANTECUBITAL Blood Culture adequate volume   Special Requests BOTTLES DRAWN AEROBIC AND ANAEROBIC  Final   Culture NO GROWTH 5 DAYS  Final   Report Status 08/05/2017 FINAL  Final  Blood Culture (routine x 2)     Status: None   Collection Time: 08/08/2017  9:45 AM  Result Value Ref Range Status   Specimen Description   Final    BLOOD BLOOD LEFT ARM IV BOTTLES DRAWN AEROBIC AND ANAEROBIC   Special Requests Blood Culture adequate volume  Final   Culture NO GROWTH 5 DAYS  Final   Report Status 08/05/2017 FINAL  Final  MRSA PCR Screening     Status: None   Collection Time: 08/08/2017  2:03 PM  Result Value Ref Range Status   MRSA by PCR NEGATIVE NEGATIVE Final    Comment:        The GeneXpert MRSA Assay (FDA approved for NASAL specimens only), is one component of a comprehensive MRSA colonization surveillance program. It is not intended to diagnose MRSA infection nor to guide or monitor treatment for MRSA infections.   C difficile quick scan w PCR reflex     Status: None   Collection Time: 08/05/17  5:18 PM  Result Value Ref Range Status   C Diff antigen NEGATIVE NEGATIVE Final   C Diff toxin NEGATIVE NEGATIVE Final   C Diff interpretation No C. difficile detected.  Final         Radiology Studies: Dg Chest Port 1 View  Result Date: 08/08/2017 CLINICAL DATA:  Aspiration.  Acute respiratory arrest. EXAM: PORTABLE CHEST 1 VIEW COMPARISON:  08/02/2017 FINDINGS: Right arm PICC line and pacemaker remain in place. Stable cardiomegaly. Bilateral pulmonary airspace disease shows interval decrease in perihilar regions bilaterally. Probable small bilateral pleural effusions again noted. IMPRESSION: Mild decrease in bilateral pulmonary airspace disease. Stable cardiomegaly and probable small  bilateral pleural effusions. Electronically Signed   By: Myles Rosenthal M.D.   On: 08/08/2017 20:58        Scheduled Meds: . carvedilol  12.5 mg Oral BID WC  . Chlorhexidine Gluconate Cloth  6 each Topical Daily  . diltiazem  60 mg Oral Q6H  . donepezil  10 mg Oral QHS  . feeding supplement (GLUCERNA SHAKE)  237 mL Oral TID BM  . furosemide  20 mg Oral Daily  . ipratropium  0.5 mg Nebulization Q6H  . levalbuterol  1.25 mg Nebulization Q6H  . mouth rinse  15 mL Mouth Rinse BID  . memantine  20 mg Oral QHS  . multivitamin with minerals  1 tablet Oral Daily  . pantoprazole sodium  40 mg Per Tube Daily  . sodium chloride flush  10-40 mL Intracatheter Q12H  . Warfarin - Pharmacist Dosing Inpatient   Does not apply Q24H   Continuous Infusions: . ampicillin-sulbactam (UNASYN) IV 3 g (08/09/17 1623)     LOS: 9 days    Time spent: 25 minutes. Greater than 50% of this time was spent in direct contact with the patient coordinating care.     Chaya Jan, MD [facility] Pager [phone]  If 7PM-7AM, please contact night-coverage [email or web address] Password [facility] 08/09/2017, 4:56 PM

## 2017-08-09 NOTE — Progress Notes (Signed)
Subjective: Events of last night noted.  I think this was clearly an episode of aspiration.  She is in the ICU now.  She is on high flow nasal cannula with good oxygenation now.  Objective: Vital signs in last 24 hours: Temp:  [97.1 F (36.2 C)-97.6 F (36.4 C)] 97.5 F (36.4 C) (12/31 0744) Pulse Rate:  [61-101] 101 (12/31 0744) Resp:  [12-20] 16 (12/31 0744) BP: (118-169)/(64-115) 158/100 (12/31 0600) SpO2:  [90 %-97 %] 90 % (12/31 0744) Weight:  [57.5 kg (126 lb 12.2 oz)] 57.5 kg (126 lb 12.2 oz) (12/31 0500) Weight change:  Last BM Date: 08/08/17  Intake/Output from previous day: 12/30 0701 - 12/31 0700 In: 320 [P.O.:120; IV Piggyback:200] Out: -   PHYSICAL EXAM General appearance: alert and no distress Resp: rhonchi bilaterally Cardio: irregularly irregular rhythm GI: soft, non-tender; bowel sounds normal; no masses,  no organomegaly Extremities: extremities normal, atraumatic, no cyanosis or edema Mildly confused  Lab Results:  Results for orders placed or performed during the hospital encounter of 07/30/2017 (from the past 48 hour(s))  Protime-INR     Status: Abnormal   Collection Time: 08/08/17  6:13 AM  Result Value Ref Range   Prothrombin Time 23.8 (H) 11.4 - 15.2 seconds   INR 1.61   Basic metabolic panel     Status: Abnormal   Collection Time: 08/08/17  6:13 AM  Result Value Ref Range   Sodium 144 135 - 145 mmol/L   Potassium 4.3 3.5 - 5.1 mmol/L    Comment: DELTA CHECK NOTED   Chloride 101 101 - 111 mmol/L   CO2 31 22 - 32 mmol/L   Glucose, Bld 153 (H) 65 - 99 mg/dL   BUN 40 (H) 6 - 20 mg/dL   Creatinine, Ser 1.06 (H) 0.44 - 1.00 mg/dL   Calcium 9.5 8.9 - 10.3 mg/dL   GFR calc non Af Amer 46 (L) >60 mL/min   GFR calc Af Amer 54 (L) >60 mL/min    Comment: (NOTE) The eGFR has been calculated using the CKD EPI equation. This calculation has not been validated in all clinical situations. eGFR's persistently <60 mL/min signify possible Chronic  Kidney Disease.    Anion gap 12 5 - 15  Glucose, capillary     Status: Abnormal   Collection Time: 08/08/17  7:36 PM  Result Value Ref Range   Glucose-Capillary 226 (H) 65 - 99 mg/dL  CBC with Differential/Platelet     Status: Abnormal   Collection Time: 08/08/17  7:48 PM  Result Value Ref Range   WBC 8.8 4.0 - 10.5 K/uL   RBC 3.74 (L) 3.87 - 5.11 MIL/uL   Hemoglobin 10.6 (L) 12.0 - 15.0 g/dL   HCT 36.8 36.0 - 46.0 %   MCV 98.4 78.0 - 100.0 fL   MCH 28.3 26.0 - 34.0 pg   MCHC 28.8 (L) 30.0 - 36.0 g/dL   RDW 17.4 (H) 11.5 - 15.5 %   Platelets 158 150 - 400 K/uL   Neutrophils Relative % 89 %   Neutro Abs 7.7 1.7 - 7.7 K/uL   Lymphocytes Relative 4 %   Lymphs Abs 0.4 (L) 0.7 - 4.0 K/uL   Monocytes Relative 6 %   Monocytes Absolute 0.6 0.1 - 1.0 K/uL   Eosinophils Relative 1 %   Eosinophils Absolute 0.1 0.0 - 0.7 K/uL   Basophils Relative 0 %   Basophils Absolute 0.0 0.0 - 0.1 K/uL  Comprehensive metabolic panel     Status:  Abnormal   Collection Time: 08/08/17  7:48 PM  Result Value Ref Range   Sodium 145 135 - 145 mmol/L   Potassium 5.1 3.5 - 5.1 mmol/L   Chloride 102 101 - 111 mmol/L   CO2 30 22 - 32 mmol/L   Glucose, Bld 243 (H) 65 - 99 mg/dL   BUN 44 (H) 6 - 20 mg/dL   Creatinine, Ser 1.38 (H) 0.44 - 1.00 mg/dL   Calcium 9.4 8.9 - 10.3 mg/dL   Total Protein 6.4 (L) 6.5 - 8.1 g/dL   Albumin 2.8 (L) 3.5 - 5.0 g/dL   AST 19 15 - 41 U/L   ALT 18 14 - 54 U/L   Alkaline Phosphatase 75 38 - 126 U/L   Total Bilirubin 0.8 0.3 - 1.2 mg/dL   GFR calc non Af Amer 34 (L) >60 mL/min   GFR calc Af Amer 39 (L) >60 mL/min    Comment: (NOTE) The eGFR has been calculated using the CKD EPI equation. This calculation has not been validated in all clinical situations. eGFR's persistently <60 mL/min signify possible Chronic Kidney Disease.    Anion gap 13 5 - 15  Lactic acid, plasma     Status: Abnormal   Collection Time: 08/08/17  7:48 PM  Result Value Ref Range   Lactic Acid,  Venous 2.5 (HH) 0.5 - 1.9 mmol/L    Comment: CRITICAL RESULT CALLED TO, READ BACK BY AND VERIFIED WITH: HEARN,J @ 2036 ON 08/08/17 BY JUW   Protime-INR     Status: Abnormal   Collection Time: 08/08/17  7:48 PM  Result Value Ref Range   Prothrombin Time 25.4 (H) 11.4 - 15.2 seconds   INR 2.33   APTT     Status: Abnormal   Collection Time: 08/08/17  7:48 PM  Result Value Ref Range   aPTT 41 (H) 24 - 36 seconds    Comment:        IF BASELINE aPTT IS ELEVATED, SUGGEST PATIENT RISK ASSESSMENT BE USED TO DETERMINE APPROPRIATE ANTICOAGULANT THERAPY.   Troponin I     Status: None   Collection Time: 08/08/17  7:48 PM  Result Value Ref Range   Troponin I <0.03 <0.03 ng/mL  Blood gas, arterial     Status: Abnormal   Collection Time: 08/08/17  7:50 PM  Result Value Ref Range   FIO2 100.00    Delivery systems NON-REBREATHER OXYGEN MASK    pH, Arterial 7.279 (L) 7.350 - 7.450   pCO2 arterial 67.8 (HH) 32.0 - 48.0 mmHg    Comment: CRITICAL RESULT CALLED TO, READ BACK BY AND VERIFIED WITH: DR Olevia Bowens AT 2005 08/08/2017 BY [facility] RRT    pO2, Arterial 62.0 (L) 83.0 - 108.0 mmHg   Bicarbonate 27.0 20.0 - 28.0 mmol/L   Acid-Base Excess 4.4 (H) 0.0 - 2.0 mmol/L   O2 Saturation 87.0 %   Collection site RADIAL    Drawn by [id number]    Sample type ARTERIAL    Allens test (pass/fail) PASS PASS  Urinalysis, Routine w reflex microscopic     Status: Abnormal   Collection Time: 08/08/17  9:53 PM  Result Value Ref Range   Color, Urine AMBER (A) YELLOW    Comment: BIOCHEMICALS MAY BE AFFECTED BY COLOR   APPearance CLOUDY (A) CLEAR   Specific Gravity, Urine 1.016 1.005 - 1.030   pH 5.0 5.0 - 8.0   Glucose, UA NEGATIVE NEGATIVE mg/dL   Hgb urine dipstick NEGATIVE NEGATIVE   Bilirubin Urine NEGATIVE  NEGATIVE   Ketones, ur NEGATIVE NEGATIVE mg/dL   Protein, ur 30 (A) NEGATIVE mg/dL   Nitrite NEGATIVE NEGATIVE   Leukocytes, UA NEGATIVE NEGATIVE   RBC / HPF 0-5 0 - 5 RBC/hpf   WBC, UA 0-5 0 - 5 WBC/hpf    Bacteria, UA NONE SEEN NONE SEEN   Squamous Epithelial / LPF 0-5 (A) NONE SEEN   Mucus PRESENT    Hyaline Casts, UA PRESENT   Lactic acid, plasma     Status: None   Collection Time: 08/08/17 11:31 PM  Result Value Ref Range   Lactic Acid, Venous 1.2 0.5 - 1.9 mmol/L  Glucose, capillary     Status: Abnormal   Collection Time: 08/09/17 12:35 AM  Result Value Ref Range   Glucose-Capillary 156 (H) 65 - 99 mg/dL  Protime-INR     Status: Abnormal   Collection Time: 08/09/17  4:04 AM  Result Value Ref Range   Prothrombin Time 26.6 (H) 11.4 - 15.2 seconds   INR 2.48   Glucose, capillary     Status: Abnormal   Collection Time: 08/09/17  6:11 AM  Result Value Ref Range   Glucose-Capillary 130 (H) 65 - 99 mg/dL    ABGS Recent Labs    08/08/17 1950  PHART 7.279*  PO2ART 62.0*  HCO3 27.0   CULTURES Recent Results (from the past 240 hour(s))  Urine Culture     Status: Abnormal   Collection Time: 07/26/2017  9:26 AM  Result Value Ref Range Status   Specimen Description URINE, CATHETERIZED  Final   Special Requests NONE  Final   Culture >=100,000 COLONIES/mL SERRATIA MARCESCENS (A)  Final   Report Status 08/02/2017 FINAL  Final   Organism ID, Bacteria SERRATIA MARCESCENS (A)  Final      Susceptibility   Serratia marcescens - MIC*    CEFAZOLIN >=64 RESISTANT Resistant     CEFTRIAXONE <=1 SENSITIVE Sensitive     CIPROFLOXACIN <=0.25 SENSITIVE Sensitive     GENTAMICIN <=1 SENSITIVE Sensitive     NITROFURANTOIN 128 RESISTANT Resistant     TRIMETH/SULFA <=20 SENSITIVE Sensitive     * >=100,000 COLONIES/mL SERRATIA MARCESCENS  Blood Culture (routine x 2)     Status: None   Collection Time: 08/03/2017  9:34 AM  Result Value Ref Range Status   Specimen Description   Final    BLOOD RIGHT ANTECUBITAL Blood Culture adequate volume   Special Requests BOTTLES DRAWN AEROBIC AND ANAEROBIC  Final   Culture NO GROWTH 5 DAYS  Final   Report Status 08/05/2017 FINAL  Final  Blood Culture  (routine x 2)     Status: None   Collection Time: 08/01/2017  9:45 AM  Result Value Ref Range Status   Specimen Description   Final    BLOOD BLOOD LEFT ARM IV BOTTLES DRAWN AEROBIC AND ANAEROBIC   Special Requests Blood Culture adequate volume  Final   Culture NO GROWTH 5 DAYS  Final   Report Status 08/05/2017 FINAL  Final  MRSA PCR Screening     Status: None   Collection Time: 08/05/2017  2:03 PM  Result Value Ref Range Status   MRSA by PCR NEGATIVE NEGATIVE Final    Comment:        The GeneXpert MRSA Assay (FDA approved for NASAL specimens only), is one component of a comprehensive MRSA colonization surveillance program. It is not intended to diagnose MRSA infection nor to guide or monitor treatment for MRSA infections.   C difficile quick  scan w PCR reflex     Status: None   Collection Time: 08/05/17  5:18 PM  Result Value Ref Range Status   C Diff antigen NEGATIVE NEGATIVE Final   C Diff toxin NEGATIVE NEGATIVE Final   C Diff interpretation No C. difficile detected.  Final   Studies/Results: Dg Chest Port 1 View  Result Date: 08/08/2017 CLINICAL DATA:  Aspiration.  Acute respiratory arrest. EXAM: PORTABLE CHEST 1 VIEW COMPARISON:  08/02/2017 FINDINGS: Right arm PICC line and pacemaker remain in place. Stable cardiomegaly. Bilateral pulmonary airspace disease shows interval decrease in perihilar regions bilaterally. Probable small bilateral pleural effusions again noted. IMPRESSION: Mild decrease in bilateral pulmonary airspace disease. Stable cardiomegaly and probable small bilateral pleural effusions. Electronically Signed   By: Earle Gell M.D.   On: 08/08/2017 20:58    Medications:  Prior to Admission:  Medications Prior to Admission  Medication Sig Dispense Refill Last Dose  . ALPRAZolam (XANAX) 0.5 MG tablet Take 0.5 mg by mouth at bedtime as needed for anxiety or sleep.   07/30/2017 at 1930  . Biotin 5000 MCG TABS Take by mouth daily. 10,000 mcg daily   07/30/2017 at  Unknown time  . Calcium Carbonate-Vitamin D (CALTRATE 600+D PO) Take 1 tablet by mouth 2 (two) times daily.    07/30/2017 at Unknown time  . carvedilol (COREG) 12.5 MG tablet Take 6.25-12.5 mg by mouth 2 (two) times daily with a meal. Take 12.5 mg in the morning and 6.25 mg in the evening.   07/15/2017 at 0800  . Cyanocobalamin (VITAMIN B-12) 5000 MCG TBDP Take 1 tablet by mouth daily.   07/30/2017 at Unknown time  . diltiazem (CARDIZEM) 90 MG tablet Take 1 tablet (90 mg total) by mouth every evening.   07/14/2017 at 0800  . donepezil (ARICEPT) 10 MG tablet Take 1 tablet by mouth at bedtime.  6 07/30/2017 at Unknown time  . feeding supplement, GLUCERNA SHAKE, (GLUCERNA SHAKE) LIQD Take 237 mLs by mouth 3 (three) times daily between meals.   07/30/2017 at Unknown time  . furosemide (LASIX) 20 MG tablet Take 1 tablet (20 mg total) by mouth daily.   07/26/2017 at 0800  . HYDROcodone-acetaminophen (NORCO/VICODIN) 5-325 MG tablet Take 1 tablet by mouth every 8 (eight) hours as needed for pain.  0 07/30/2017 at Unknown time  . mirtazapine (REMERON) 15 MG tablet Take 1 tablet by mouth at bedtime.  5 07/30/2017 at Unknown time  . Multiple Vitamin (MULTIVITAMIN) tablet Take 1 tablet by mouth daily.    07/30/2017 at Unknown time  . NAMZARIC 28-10 MG CP24 take one capsule by mouth once daily  12 07/30/2017 at Unknown time  . omeprazole (PRILOSEC) 20 MG capsule Take 20 mg by mouth daily.   07/30/2017 at Unknown time  . potassium chloride (K-DUR) 10 MEQ tablet Take 1 tablet (10 mEq total) by mouth daily. 30 tablet 0 07/30/2017 at Unknown time  . warfarin (COUMADIN) 2 MG tablet Take 2 mg by mouth daily at 6 PM.   07/30/2017 at 1930   Scheduled: . carvedilol  12.5 mg Oral BID WC  . Chlorhexidine Gluconate Cloth  6 each Topical Daily  . diltiazem  60 mg Oral Q6H  . donepezil  10 mg Oral QHS  . feeding supplement (GLUCERNA SHAKE)  237 mL Oral TID BM  . furosemide  20 mg Oral Daily  . ipratropium  0.5 mg  Nebulization Q6H  . levalbuterol  1.25 mg Nebulization Q6H  . mouth rinse  15 mL Mouth Rinse BID  . memantine  20 mg Oral QHS  . multivitamin with minerals  1 tablet Oral Daily  . pantoprazole sodium  40 mg Per Tube Daily  . sodium chloride flush  10-40 mL Intracatheter Q12H  . Warfarin - Pharmacist Dosing Inpatient   Does not apply Q24H   Continuous: . ampicillin-sulbactam (UNASYN) IV Stopped (08/09/17 0430)   [id number] **OR** acetaminophen, haloperidol lactate, ondansetron **OR** ondansetron (ZOFRAN) IV, RESOURCE THICKENUP CLEAR, sodium chloride flush  Assesment: She had respiratory arrest following aspiration.  She is back on Unasyn which I think is appropriate.  I discussed with her husband at bedside that PEG tube will not prevent aspiration but may improve her nutrition.  I also told him that there is not any sort of surgical solution to aspiration except for tracheostomy and tying her vocal cords which I would not recommend Active Problems:   Acute renal failure superimposed on stage 3 chronic kidney disease (HCC)   Sepsis ([place])   Sepsis due to undetermined organism ([place])   Chronic diastolic CHF (congestive heart failure) (HCC)   Acute on chronic respiratory failure with hypoxia (HCC)   Acute metabolic encephalopathy   Lobar pneumonia (HCC)   Permanent atrial fibrillation ([place])   Dementia without behavioral disturbance    Plan: Continue antibiotics.  Continue n.p.o. with aspiration precautions when she restarts a diet    LOS: 9 days   Keison Glendinning L 08/09/2017, 8:03 AM

## 2017-08-09 NOTE — Progress Notes (Signed)
Code blue overhead paged  at  approx 1930. Pt found to be unresponsive, ashen in color, agnol breathing.. Initial o2 sats in 60s and RT bagged. Pt coughed and small amt of yellow food suctioned from mouth.  NRB applied. Zoll pads applied: hr 60s, paced afib. O2 sats increased to 85-89%. Pt lethagic but able to verbalize she was at hospital. Ed MD stimulated gag reflex. Pt coughed and o2 sats increased to mid 90s. Labs obtained via rt upper arm picc. Abg and chest xray performed. VS at 1955- bp 108/50,  Hr 59, and o2 sats 100% on NRB. Pt color improved. Pt sitter at bedside talking to pt husband via phone. MD imformed sitter of pt conidition and transfer immediate to icu10.

## 2017-08-10 ENCOUNTER — Inpatient Hospital Stay (HOSPITAL_COMMUNITY): Payer: Medicare Other

## 2017-08-10 DIAGNOSIS — T17908D Unspecified foreign body in respiratory tract, part unspecified causing other injury, subsequent encounter: Secondary | ICD-10-CM

## 2017-08-10 LAB — BLOOD GAS, ARTERIAL
Acid-Base Excess: 8.8 mmol/L — ABNORMAL HIGH (ref 0.0–2.0)
BICARBONATE: 31.2 mmol/L — AB (ref 20.0–28.0)
Drawn by: 21310
FIO2: 100
O2 SAT: 92.5 %
PCO2 ART: 62.6 mmHg — AB (ref 32.0–48.0)
PO2 ART: 72.2 mmHg — AB (ref 83.0–108.0)
Patient temperature: 37
pH, Arterial: 7.358 (ref 7.350–7.450)

## 2017-08-10 LAB — GLUCOSE, CAPILLARY
GLUCOSE-CAPILLARY: 154 mg/dL — AB (ref 65–99)
Glucose-Capillary: 198 mg/dL — ABNORMAL HIGH (ref 65–99)
Glucose-Capillary: 200 mg/dL — ABNORMAL HIGH (ref 65–99)

## 2017-08-10 LAB — PROTIME-INR
INR: 2.63
Prothrombin Time: 27.9 seconds — ABNORMAL HIGH (ref 11.4–15.2)

## 2017-08-10 MED ORDER — METHYLPREDNISOLONE SODIUM SUCC 40 MG IJ SOLR
40.0000 mg | Freq: Once | INTRAMUSCULAR | Status: AC
  Administered 2017-08-10: 40 mg via INTRAVENOUS
  Filled 2017-08-10: qty 1

## 2017-08-10 MED ORDER — FUROSEMIDE 10 MG/ML IJ SOLN
20.0000 mg | Freq: Once | INTRAMUSCULAR | Status: AC
  Administered 2017-08-10: 20 mg via INTRAVENOUS
  Filled 2017-08-10: qty 2

## 2017-08-10 MED ORDER — WARFARIN SODIUM 2 MG PO TABS
2.0000 mg | ORAL_TABLET | Freq: Once | ORAL | Status: AC
  Administered 2017-08-10: 2 mg via ORAL
  Filled 2017-08-10: qty 1

## 2017-08-10 MED ORDER — LEVALBUTEROL HCL 0.63 MG/3ML IN NEBU: 0.6300 mg | INHALATION_SOLUTION | Freq: Four times a day (QID) | RESPIRATORY_TRACT | Status: DC | PRN

## 2017-08-10 NOTE — Progress Notes (Signed)
Night shift ICU/SDU coverage note.  The patient was seen earlier during the shift due to decreased oxygen saturation in the high 80s and 90s and increased oxygen requirement.  This happened after she was being fed by her caregiver.  Her chest radiograph shows improved aeration when compared to this morning.  ABG is improved from previous, but still shows low PO2 and elevated PCO2.  I discussed with her caregiver the need for PEG tube in order to avoid further episodes of aspiration.  She voiced understanding, but expressed that this is a decision to be made by her husband.  I explained again the contraindication of BiPAP and how she would be a very difficult extubation if he comes to the point where she needs ET intubation with mechanical ventilation.  I made the patient n.p.o.  She still remains a full code.  Lungs: Decreased breath sounds on bases,  but improved air entry when compared to this morning. Heart: S1-S2, irregularly irregular in the 80s. Abdomen: Soft, nontender Extremities: No edema, no cyanosis. Psych: Oriented to name.  Knows she is in the hospital.     Blood gas, arterial [[id number]] (Abnormal) Collected: 08/10/17 2015  Updated: 08/10/17 2031   Specimen Type: Blood, Arterial    FIO2 100.00   Delivery systems NON-REBREATHER OXYGEN MASK   pH, Arterial 7.358   pCO2 arterial 62.6 Abnormally high  mmHg   pO2, Arterial 72.2 Abnormally low  mmHg   Bicarbonate 31.2 Abnormally high  mmol/L   Acid-Base Excess 8.8 Abnormally high  mmol/L   O2 Saturation 92.5 %   Patient temperature 37.0   Collection site LEFT BRACHIAL   Drawn by [id number]   Sample type ARTERIAL   Allens test (pass/fail) NOT INDICATED Abnormal    PORTABLE CHEST 1 VIEW  COMPARISON:  One-view chest x-ray from the same day at 5:51 a.m.  FINDINGS: Heart is enlarged. Diffuse interstitial and airspace disease is again seen. Bilateral pleural effusions are present. Basilar airspace disease is worse left than right.  Aeration is slightly improved.  A right-sided PICC line is in place. Pacing wires are noted. Aortic atherosclerosis is present.  A chronic left humerus fracture is present.  IMPRESSION: 1. Congestive heart failure with left greater than right interstitial edema and effusions. 2. Aeration is slightly improved. 3. Basilar airspace disease likely reflects atelectasis. Infection is not excluded.   Sanda Klein, MD.

## 2017-08-10 NOTE — Progress Notes (Signed)
PROGRESS NOTE    [name]  ZOX:[id number] DOB: 11-Sep-1928 DOA: 08-17-2017 PCP: Ignatius Specking, MD     Brief Narrative:  84 year old woman admitted from home on 12/22 due to cough and generalized weakness.  She was found to have aspiration pneumonia and significant signs of aspiration on modified barium swallow.  She also developed A. fib with RVR while hospitalized.  She was seen by physical therapy and SNF was recommended.  Discussed with husband on 12/30 and he is not willing to pursue SNF and wants to take patient home when she is medically cleared.  Overnight on 12/30 CODE BLUE was called due to what was perceived to be agonal respirations following an aspiration event.  She was transferred to the ICU, did not require CPR.  Overnight on 12/31 again had issues with increasing hypoxemia.  Is now on high flow nasal cannula at 10-15 L.   Assessment & Plan:   Active Problems:   Acute renal failure superimposed on stage 3 chronic kidney disease (HCC)   Sepsis (HCC)   Sepsis due to undetermined organism (HCC)   Chronic diastolic CHF (congestive heart failure) (HCC)   Acute on chronic respiratory failure with hypoxia (HCC)   Acute metabolic encephalopathy   Lobar pneumonia (HCC)   Permanent atrial fibrillation (HCC)   Dementia without behavioral disturbance   Aspiration into airway   Palliative care encounter   Goals of care, counseling/discussion   Sepsis -Secondary to aspiration pneumonia. -Sepsis parameters have resolved. -Continue Zosyn. -Overnight event noted.  Palliative care discussions with family continue.  Dysphagia -Underwent MBS with speech therapy and noted to have delay in initiation of swallow as well as silent aspiration.   -Seen by speech therapy again today.  Pured diet with honey thick liquids has been recommended. -I have continued to discuss with patient and patient's husband that PEG tube placement will not decrease entirely her risk for  aspiration.  Acute on chronic respiratory failure with hypoxemia -Related to pneumonia as well as aspiration. -She is currently requiring 10-15 L of nasal cannula.  Acute on chronic kidney disease stage III -Creatinine is down to baseline of 1-1.1 on discharge.  UTI -Urine culture positive for Serratia, adequately treated with IV Zosyn.  Acute metabolic encephalopathy -Suspect related to sepsis. -She also has baseline dementia. -She remains a bit drowsy but is arousable and able to answer questions appropriately.  Atrial fibrillation with rapid ventricular response -Likely exacerbated by respiratory illness. -Did initially require IV Cardizem infusion as well as IV metoprolol to control heart rate.  She has since been transitioned over to Coreg and oral diltiazem with improvement in heart rate. -She is chronically anticoagulated on Coumadin.  Acute on chronic diastolic heart failure -Likely precipitated by IV hydration for pneumonia, treated with IV Lasix, volume status appears close to euvolemic, was transitioned to oral Lasix on 12/29. -Echo in February 2018 showed an ejection fraction of 60-65% with decreased left ventricular diastolic compliance. -Overnight on 12/31 was given a dose of IV Lasix due to chest x-ray showing signs of mild interstitial edema.   DVT prophylaxis: Coumadin Code Status: Remains full code, discussed with husband Family Communication: Husband at bedside updated on plan of care all questions answered Disposition Plan: I continue to discuss goals of care with patient and her husband on a daily basis.  He seems to be more understanding today.  States that he would like some time to think things over.  Although he has not told me completely  that he does not want the PEG tube, he seems to understand now that placing it will not altogether reduce her risk of aspirating.  Consultants:   Pulmonary, Dr. Juanetta Gosling  Procedures:   None  Antimicrobials:   Anti-infectives (From admission, onward)   Start     Dose/Rate Route Frequency Ordered Stop   08/09/17 1559  Ampicillin-Sulbactam (UNASYN) 3 g in sodium chloride 0.9 % 100 mL IVPB     3 g 200 mL/hr over 30 Minutes Intravenous Every 12 hours 08/09/17 0853     08/08/17 2100  Ampicillin-Sulbactam (UNASYN) 3 g in sodium chloride 0.9 % 100 mL IVPB  Status:  Discontinued     3 g 200 mL/hr over 30 Minutes Intravenous Every 6 hours 08/08/17 2053 08/09/17 0853   08/06/17 1400  amoxicillin-clavulanate (AUGMENTIN) 875-125 MG per tablet 1 tablet  Status:  Discontinued     1 tablet Oral Every 12 hours 08/06/17 1311 08/08/17 2053   08/01/17 1000  azithromycin (ZITHROMAX) 500 mg in dextrose 5 % 250 mL IVPB  Status:  Discontinued     500 mg 250 mL/hr over 60 Minutes Intravenous Every 24 hours 08/15/2017 0943 08/04/17 1024   08/01/17 1000  cefTRIAXone (ROCEPHIN) 1 g in dextrose 5 % 50 mL IVPB  Status:  Discontinued     1 g 100 mL/hr over 30 Minutes Intravenous Every 24 hours 09/02/2017 0943 08/12/2017 1411   August 13, 2017 1415  azithromycin (ZITHROMAX) 500 mg in dextrose 5 % 250 mL IVPB  Status:  Discontinued     500 mg 250 mL/hr over 60 Minutes Intravenous  Once 08/30/2017 1403 August 13, 2017 1409   09/01/2017 1415  piperacillin-tazobactam (ZOSYN) IVPB 3.375 g  Status:  Discontinued     3.375 g 12.5 mL/hr over 240 Minutes Intravenous Every 8 hours Aug 13, 2017 1403 08/06/17 1311   08/23/2017 1045  cefTRIAXone (ROCEPHIN) 1 g in dextrose 5 % 50 mL IVPB     1 g 100 mL/hr over 30 Minutes Intravenous  Once 08/21/2017 1033 August 13, 2017 1158   09/07/2017 0930  cefTRIAXone (ROCEPHIN) 1 g in dextrose 5 % 50 mL IVPB     1 g 100 mL/hr over 30 Minutes Intravenous  Once 08/24/2017 0927 08/30/2017 1043   08/16/2017 0930  azithromycin (ZITHROMAX) 500 mg in dextrose 5 % 250 mL IVPB     500 mg 250 mL/hr over 60 Minutes Intravenous  Once 09/06/2017 0927 13-Aug-2017 1157       Subjective: In bed, on high flow oxygen, opens eyes to voice and can answer  questions, however is very drowsy and quickly falls back to sleep.  Objective: Vitals:   08/10/17 1113 08/10/17 1200 08/10/17 1228 08/10/17 1439  BP:  (!) 160/86 (!) 160/86   Pulse: 71 75    Resp: 18 19    Temp: 97.8 F (36.6 C)     TempSrc: Axillary     SpO2: 98% 93%  95%  Weight:      Height:        Intake/Output Summary (Last 24 hours) at 08/10/2017 1630 Last data filed at 08/10/2017 1049 Gross per 24 hour  Intake 210 ml  Output 675 ml  Net -465 ml   Filed Weights   08/07/17 0600 08/08/17 2023 08/09/17 0500  Weight: 57.8 kg (127 lb 6.8 oz) 57.5 kg (126 lb 12.2 oz) 57.5 kg (126 lb 12.2 oz)    Examination:  General exam: Drowsy, oriented x3 Respiratory system: Coarse bilateral breath sounds, no wheezing Cardiovascular system:R irregular rhythm, not  tachycardic Gastrointestinal system: Abdomen is nondistended, soft and nontender. No organomegaly or masses felt. Normal bowel sounds heard. Central nervous system: Alert and oriented. No focal neurological deficits. Extremities: Trace bilateral pitting edema, positive pulses Skin: No rashes, lesions or ulcers Psychiatry: Unable to fully assess given current mental state    Data Reviewed: I have personally reviewed following labs and imaging studies  CBC: Recent Labs  Lab 08/05/17 0416 08/07/17 0445 08/08/17 1948  WBC 11.9* 8.7 8.8  NEUTROABS  --   --  7.7  HGB 10.9* 10.5* 10.6*  HCT 36.8 35.5* 36.8  MCV 94.8 96.2 98.4  PLT 139* 137* 158   Basic Metabolic Panel: Recent Labs  Lab 08/05/17 0416 08/06/17 0552 08/07/17 0445 08/08/17 0613 08/08/17 1948  NA 146* 143 145 144 145  K 3.0* 3.8 3.4* 4.3 5.1  CL 102 101 101 101 102  CO2 30 32 32 31 30  GLUCOSE 151* 136* 133* 153* 243*  BUN 32* 33* 37* 40* 44*  CREATININE 1.12* 1.22* 1.22* 1.06* 1.38*  CALCIUM 8.8* 8.9 9.0 9.5 9.4  MG 1.6*  --   --   --   --    GFR: Estimated Creatinine Clearance: 26.6 mL/min (A) (by C-G formula based on SCr of 1.38 mg/dL  (H)). Liver Function Tests: Recent Labs  Lab 08/08/17 1948  AST 19  ALT 18  ALKPHOS 75  BILITOT 0.8  PROT 6.4*  ALBUMIN 2.8*   No results for input(s): LIPASE, AMYLASE in the last 168 hours. No results for input(s): AMMONIA in the last 168 hours. Coagulation Profile: Recent Labs  Lab 08/07/17 0445 08/08/17 0613 08/08/17 1948 08/09/17 0404 08/10/17 0359  INR 2.26 2.15 2.33 2.48 2.63   Cardiac Enzymes: Recent Labs  Lab 08/08/17 1948  TROPONINI <0.03   BNP (last 3 results) No results for input(s): PROBNP in the last 8760 hours. HbA1C: Recent Labs    08/08/17 2331  HGBA1C 6.3*   CBG: Recent Labs  Lab 08/09/17 1127 08/09/17 1824 08/10/17 0018 08/10/17 0629 08/10/17 1112  GLUCAP 131* 253* 198* 154* 200*   Lipid Profile: No results for input(s): CHOL, HDL, LDLCALC, TRIG, CHOLHDL, LDLDIRECT in the last 72 hours. Thyroid Function Tests: No results for input(s): TSH, T4TOTAL, FREET4, T3FREE, THYROIDAB in the last 72 hours. Anemia Panel: No results for input(s): VITAMINB12, FOLATE, FERRITIN, TIBC, IRON, RETICCTPCT in the last 72 hours. Urine analysis:    Component Value Date/Time   COLORURINE AMBER (A) 08/08/2017 2153   APPEARANCEUR CLOUDY (A) 08/08/2017 2153   LABSPEC 1.016 08/08/2017 2153   PHURINE 5.0 08/08/2017 2153   GLUCOSEU NEGATIVE 08/08/2017 2153   HGBUR NEGATIVE 08/08/2017 2153   BILIRUBINUR NEGATIVE 08/08/2017 2153   KETONESUR NEGATIVE 08/08/2017 2153   PROTEINUR 30 (A) 08/08/2017 2153   UROBILINOGEN 0.2 01/17/2015 1030   NITRITE NEGATIVE 08/08/2017 2153   LEUKOCYTESUR NEGATIVE 08/08/2017 2153   Sepsis Labs: @LABRCNTIP (procalcitonin:4,lacticidven:4)  ) Recent Results (from the past 240 hour(s))  C difficile quick scan w PCR reflex     Status: None   Collection Time: 08/05/17  5:18 PM  Result Value Ref Range Status   C Diff antigen NEGATIVE NEGATIVE Final   C Diff toxin NEGATIVE NEGATIVE Final   C Diff interpretation No C. difficile  detected.  Final         Radiology Studies: Dg Chest Port 1 View  Result Date: 08/10/2017 CLINICAL DATA:  Hypoxia EXAM: PORTABLE CHEST 1 VIEW COMPARISON:  08/08/2017 FINDINGS: Cardiac shadow is enlarged. Pacing  device is again noted and stable. Vascular congestion with bilateral pleural effusions are seen. Right-sided PICC line is again noted and stable. No bony abnormality is noted. IMPRESSION: Changes consistent with CHF with bilateral effusions. Electronically Signed   By: Alcide Clever M.D.   On: 08/10/2017 07:08   Dg Chest Port 1 View  Result Date: 08/08/2017 CLINICAL DATA:  Aspiration.  Acute respiratory arrest. EXAM: PORTABLE CHEST 1 VIEW COMPARISON:  08/02/2017 FINDINGS: Right arm PICC line and pacemaker remain in place. Stable cardiomegaly. Bilateral pulmonary airspace disease shows interval decrease in perihilar regions bilaterally. Probable small bilateral pleural effusions again noted. IMPRESSION: Mild decrease in bilateral pulmonary airspace disease. Stable cardiomegaly and probable small bilateral pleural effusions. Electronically Signed   By: Myles Rosenthal M.D.   On: 08/08/2017 20:58        Scheduled Meds: . carvedilol  12.5 mg Oral BID WC  . Chlorhexidine Gluconate Cloth  6 each Topical Daily  . diltiazem  60 mg Oral Q6H  . donepezil  10 mg Oral QHS  . feeding supplement (GLUCERNA SHAKE)  237 mL Oral TID BM  . furosemide  20 mg Oral Daily  . ipratropium  0.5 mg Nebulization Q6H  . levalbuterol  1.25 mg Nebulization Q6H  . mouth rinse  15 mL Mouth Rinse BID  . memantine  20 mg Oral QHS  . methylPREDNISolone (SOLU-MEDROL) injection  40 mg Intravenous Once  . multivitamin with minerals  1 tablet Oral Daily  . pantoprazole sodium  40 mg Per Tube Daily  . sodium chloride flush  10-40 mL Intracatheter Q12H  . warfarin  2 mg Oral Once  . Warfarin - Pharmacist Dosing Inpatient   Does not apply Q24H   Continuous Infusions: . ampicillin-sulbactam (UNASYN) IV Stopped  (08/10/17 0430)     LOS: 10 days    Time spent: 35 minutes. Greater than 50% of this time was spent in direct contact with the patient coordinating care.     Chaya Jan, MD [facility] Pager [phone]  If 7PM-7AM, please contact night-coverage [email or web address] Password [email or web address] 08/10/2017, 4:30 PM

## 2017-08-10 NOTE — Progress Notes (Signed)
PT Cancellation Note  Patient Details Name: [name] MRN: [id number] DOB: 02/05/1929   Cancelled Treatment:    Reason Eval/Treat Not Completed: Medical issues which prohibited therapy.  Patient on hold today per RN request secondary to patient not doing well.  Will check back tomorrow.    8:45 AM, 08/10/17 [name], MPT Physical Therapist with [facility] [phone] office [phone] mobile phone

## 2017-08-10 NOTE — Progress Notes (Signed)
Subjective: She had more trouble overnight with hypoxia.  This morning she is on high flow nasal cannula at 15.  No new complaints.  Objective: Vital signs in last 24 hours: Temp:  [97.2 F (36.2 C)-98.2 F (36.8 C)] 97.2 F (36.2 C) (01/01 0753) Pulse Rate:  [51-106] 92 (01/01 0753) Resp:  [10-26] 21 (01/01 0753) BP: (110-169)/(53-121) 133/76 (01/01 0735) SpO2:  [89 %-96 %] 94 % (01/01 0817) Weight change:  Last BM Date: 08/09/17  Intake/Output from previous day: 12/31 0701 - 01/01 0700 In: 210 [I.V.:10; IV Piggyback:200] Out: 675 [Urine:675]  PHYSICAL EXAM General appearance: alert and Sluggish Resp: rhonchi bilaterally Cardio: irregularly irregular rhythm GI: soft, non-tender; bowel sounds normal; no masses,  no organomegaly Extremities: extremities normal, atraumatic, no cyanosis or edema Skin turgor fair  Lab Results:  Results for orders placed or performed during the hospital encounter of 08/02/2017 (from the past 48 hour(s))  Glucose, capillary     Status: Abnormal   Collection Time: 08/08/17  7:36 PM  Result Value Ref Range   Glucose-Capillary 226 (H) 65 - 99 mg/dL  CBC with Differential/Platelet     Status: Abnormal   Collection Time: 08/08/17  7:48 PM  Result Value Ref Range   WBC 8.8 4.0 - 10.5 K/uL   RBC 3.74 (L) 3.87 - 5.11 MIL/uL   Hemoglobin 10.6 (L) 12.0 - 15.0 g/dL   HCT 36.8 36.0 - 46.0 %   MCV 98.4 78.0 - 100.0 fL   MCH 28.3 26.0 - 34.0 pg   MCHC 28.8 (L) 30.0 - 36.0 g/dL   RDW 17.4 (H) 11.5 - 15.5 %   Platelets 158 150 - 400 K/uL   Neutrophils Relative % 89 %   Neutro Abs 7.7 1.7 - 7.7 K/uL   Lymphocytes Relative 4 %   Lymphs Abs 0.4 (L) 0.7 - 4.0 K/uL   Monocytes Relative 6 %   Monocytes Absolute 0.6 0.1 - 1.0 K/uL   Eosinophils Relative 1 %   Eosinophils Absolute 0.1 0.0 - 0.7 K/uL   Basophils Relative 0 %   Basophils Absolute 0.0 0.0 - 0.1 K/uL  Comprehensive metabolic panel     Status: Abnormal   Collection Time: 08/08/17  7:48 PM   Result Value Ref Range   Sodium 145 135 - 145 mmol/L   Potassium 5.1 3.5 - 5.1 mmol/L   Chloride 102 101 - 111 mmol/L   CO2 30 22 - 32 mmol/L   Glucose, Bld 243 (H) 65 - 99 mg/dL   BUN 44 (H) 6 - 20 mg/dL   Creatinine, Ser 1.38 (H) 0.44 - 1.00 mg/dL   Calcium 9.4 8.9 - 10.3 mg/dL   Total Protein 6.4 (L) 6.5 - 8.1 g/dL   Albumin 2.8 (L) 3.5 - 5.0 g/dL   AST 19 15 - 41 U/L   ALT 18 14 - 54 U/L   Alkaline Phosphatase 75 38 - 126 U/L   Total Bilirubin 0.8 0.3 - 1.2 mg/dL   GFR calc non Af Amer 34 (L) >60 mL/min   GFR calc Af Amer 39 (L) >60 mL/min    Comment: (NOTE) The eGFR has been calculated using the CKD EPI equation. This calculation has not been validated in all clinical situations. eGFR's persistently <60 mL/min signify possible Chronic Kidney Disease.    Anion gap 13 5 - 15  Lactic acid, plasma     Status: Abnormal   Collection Time: 08/08/17  7:48 PM  Result Value Ref Range   Lactic  Acid, Venous 2.5 (HH) 0.5 - 1.9 mmol/L    Comment: CRITICAL RESULT CALLED TO, READ BACK BY AND VERIFIED WITH: HEARN,J @ 2036 ON 08/08/17 BY JUW   Protime-INR     Status: Abnormal   Collection Time: 08/08/17  7:48 PM  Result Value Ref Range   Prothrombin Time 25.4 (H) 11.4 - 15.2 seconds   INR 2.33   APTT     Status: Abnormal   Collection Time: 08/08/17  7:48 PM  Result Value Ref Range   aPTT 41 (H) 24 - 36 seconds    Comment:        IF BASELINE aPTT IS ELEVATED, SUGGEST PATIENT RISK ASSESSMENT BE USED TO DETERMINE APPROPRIATE ANTICOAGULANT THERAPY.   Troponin I     Status: None   Collection Time: 08/08/17  7:48 PM  Result Value Ref Range   Troponin I <0.03 <0.03 ng/mL  Blood gas, arterial     Status: Abnormal   Collection Time: 08/08/17  7:50 PM  Result Value Ref Range   FIO2 100.00    Delivery systems NON-REBREATHER OXYGEN MASK    pH, Arterial 7.279 (L) 7.350 - 7.450   pCO2 arterial 67.8 (HH) 32.0 - 48.0 mmHg    Comment: CRITICAL RESULT CALLED TO, READ BACK BY AND VERIFIED  WITH: DR Olevia Bowens AT 2005 08/08/2017 BY [facility] RRT    pO2, Arterial 62.0 (L) 83.0 - 108.0 mmHg   Bicarbonate 27.0 20.0 - 28.0 mmol/L   Acid-Base Excess 4.4 (H) 0.0 - 2.0 mmol/L   O2 Saturation 87.0 %   Collection site RADIAL    Drawn by [id number]    Sample type ARTERIAL    Allens test (pass/fail) PASS PASS  Urinalysis, Routine w reflex microscopic     Status: Abnormal   Collection Time: 08/08/17  9:53 PM  Result Value Ref Range   Color, Urine AMBER (A) YELLOW    Comment: BIOCHEMICALS MAY BE AFFECTED BY COLOR   APPearance CLOUDY (A) CLEAR   Specific Gravity, Urine 1.016 1.005 - 1.030   pH 5.0 5.0 - 8.0   Glucose, UA NEGATIVE NEGATIVE mg/dL   Hgb urine dipstick NEGATIVE NEGATIVE   Bilirubin Urine NEGATIVE NEGATIVE   Ketones, ur NEGATIVE NEGATIVE mg/dL   Protein, ur 30 (A) NEGATIVE mg/dL   Nitrite NEGATIVE NEGATIVE   Leukocytes, UA NEGATIVE NEGATIVE   RBC / HPF 0-5 0 - 5 RBC/hpf   WBC, UA 0-5 0 - 5 WBC/hpf   Bacteria, UA NONE SEEN NONE SEEN   Squamous Epithelial / LPF 0-5 (A) NONE SEEN   Mucus PRESENT    Hyaline Casts, UA PRESENT   Hemoglobin A1c     Status: Abnormal   Collection Time: 08/08/17 11:31 PM  Result Value Ref Range   Hgb A1c MFr Bld 6.3 (H) 4.8 - 5.6 %    Comment: (NOTE) Pre diabetes:          5.7%-6.4% Diabetes:              >6.4% Glycemic control for   <7.0% adults with diabetes    Mean Plasma Glucose 134.11 mg/dL    Comment: Performed at [facility], [address] [place], [place] [address]  Lactic acid, plasma     Status: None   Collection Time: 08/08/17 11:31 PM  Result Value Ref Range   Lactic Acid, Venous 1.2 0.5 - 1.9 mmol/L  Glucose, capillary     Status: Abnormal   Collection Time: 08/09/17 12:35 AM  Result Value  Ref Range   Glucose-Capillary 156 (H) 65 - 99 mg/dL  Protime-INR     Status: Abnormal   Collection Time: 08/09/17  4:04 AM  Result Value Ref Range   Prothrombin Time 26.6 (H) 11.4 - 15.2 seconds   INR 2.48   Glucose, capillary      Status: Abnormal   Collection Time: 08/09/17  6:11 AM  Result Value Ref Range   Glucose-Capillary 130 (H) 65 - 99 mg/dL  Glucose, capillary     Status: Abnormal   Collection Time: 08/09/17 11:27 AM  Result Value Ref Range   Glucose-Capillary 131 (H) 65 - 99 mg/dL  Glucose, capillary     Status: Abnormal   Collection Time: 08/09/17  6:24 PM  Result Value Ref Range   Glucose-Capillary 253 (H) 65 - 99 mg/dL  Glucose, capillary     Status: Abnormal   Collection Time: 08/10/17 12:18 AM  Result Value Ref Range   Glucose-Capillary 198 (H) 65 - 99 mg/dL   Comment 1 Notify RN   Protime-INR     Status: Abnormal   Collection Time: 08/10/17  3:59 AM  Result Value Ref Range   Prothrombin Time 27.9 (H) 11.4 - 15.2 seconds   INR 2.63   Glucose, capillary     Status: Abnormal   Collection Time: 08/10/17  6:29 AM  Result Value Ref Range   Glucose-Capillary 154 (H) 65 - 99 mg/dL   Comment 1 Notify RN     ABGS Recent Labs    08/08/17 1950  PHART 7.279*  PO2ART 62.0*  HCO3 27.0   CULTURES Recent Results (from the past 240 hour(s))  MRSA PCR Screening     Status: None   Collection Time: 07/15/2017  2:03 PM  Result Value Ref Range Status   MRSA by PCR NEGATIVE NEGATIVE Final    Comment:        The GeneXpert MRSA Assay (FDA approved for NASAL specimens only), is one component of a comprehensive MRSA colonization surveillance program. It is not intended to diagnose MRSA infection nor to guide or monitor treatment for MRSA infections.   C difficile quick scan w PCR reflex     Status: None   Collection Time: 08/05/17  5:18 PM  Result Value Ref Range Status   C Diff antigen NEGATIVE NEGATIVE Final   C Diff toxin NEGATIVE NEGATIVE Final   C Diff interpretation No C. difficile detected.  Final   Studies/Results: Dg Chest Port 1 View  Result Date: 08/10/2017 CLINICAL DATA:  Hypoxia EXAM: PORTABLE CHEST 1 VIEW COMPARISON:  08/08/2017 FINDINGS: Cardiac shadow is enlarged. Pacing device  is again noted and stable. Vascular congestion with bilateral pleural effusions are seen. Right-sided PICC line is again noted and stable. No bony abnormality is noted. IMPRESSION: Changes consistent with CHF with bilateral effusions. Electronically Signed   By: Inez Catalina M.D.   On: 08/10/2017 07:08   Dg Chest Port 1 View  Result Date: 08/08/2017 CLINICAL DATA:  Aspiration.  Acute respiratory arrest. EXAM: PORTABLE CHEST 1 VIEW COMPARISON:  08/02/2017 FINDINGS: Right arm PICC line and pacemaker remain in place. Stable cardiomegaly. Bilateral pulmonary airspace disease shows interval decrease in perihilar regions bilaterally. Probable small bilateral pleural effusions again noted. IMPRESSION: Mild decrease in bilateral pulmonary airspace disease. Stable cardiomegaly and probable small bilateral pleural effusions. Electronically Signed   By: Earle Gell M.D.   On: 08/08/2017 20:58    Medications:  Prior to Admission:  Medications Prior to Admission  Medication Sig  Dispense Refill Last Dose  . ALPRAZolam (XANAX) 0.5 MG tablet Take 0.5 mg by mouth at bedtime as needed for anxiety or sleep.   07/30/2017 at 1930  . Biotin 5000 MCG TABS Take by mouth daily. 10,000 mcg daily   07/30/2017 at Unknown time  . Calcium Carbonate-Vitamin D (CALTRATE 600+D PO) Take 1 tablet by mouth 2 (two) times daily.    07/30/2017 at Unknown time  . carvedilol (COREG) 12.5 MG tablet Take 6.25-12.5 mg by mouth 2 (two) times daily with a meal. Take 12.5 mg in the morning and 6.25 mg in the evening.   07/30/2017 at 0800  . Cyanocobalamin (VITAMIN B-12) 5000 MCG TBDP Take 1 tablet by mouth daily.   07/30/2017 at Unknown time  . diltiazem (CARDIZEM) 90 MG tablet Take 1 tablet (90 mg total) by mouth every evening.   07/18/2017 at 0800  . donepezil (ARICEPT) 10 MG tablet Take 1 tablet by mouth at bedtime.  6 07/30/2017 at Unknown time  . feeding supplement, GLUCERNA SHAKE, (GLUCERNA SHAKE) LIQD Take 237 mLs by mouth 3 (three)  times daily between meals.   07/30/2017 at Unknown time  . furosemide (LASIX) 20 MG tablet Take 1 tablet (20 mg total) by mouth daily.   07/15/2017 at 0800  . HYDROcodone-acetaminophen (NORCO/VICODIN) 5-325 MG tablet Take 1 tablet by mouth every 8 (eight) hours as needed for pain.  0 07/30/2017 at Unknown time  . mirtazapine (REMERON) 15 MG tablet Take 1 tablet by mouth at bedtime.  5 07/30/2017 at Unknown time  . Multiple Vitamin (MULTIVITAMIN) tablet Take 1 tablet by mouth daily.    07/30/2017 at Unknown time  . NAMZARIC 28-10 MG CP24 take one capsule by mouth once daily  12 07/30/2017 at Unknown time  . omeprazole (PRILOSEC) 20 MG capsule Take 20 mg by mouth daily.   07/30/2017 at Unknown time  . potassium chloride (K-DUR) 10 MEQ tablet Take 1 tablet (10 mEq total) by mouth daily. 30 tablet 0 07/30/2017 at Unknown time  . warfarin (COUMADIN) 2 MG tablet Take 2 mg by mouth daily at 6 PM.   07/30/2017 at 1930   Scheduled: . carvedilol  12.5 mg Oral BID WC  . Chlorhexidine Gluconate Cloth  6 each Topical Daily  . diltiazem  60 mg Oral Q6H  . donepezil  10 mg Oral QHS  . feeding supplement (GLUCERNA SHAKE)  237 mL Oral TID BM  . furosemide  20 mg Oral Daily  . ipratropium  0.5 mg Nebulization Q6H  . levalbuterol  1.25 mg Nebulization Q6H  . mouth rinse  15 mL Mouth Rinse BID  . memantine  20 mg Oral QHS  . methylPREDNISolone (SOLU-MEDROL) injection  40 mg Intravenous Once  . multivitamin with minerals  1 tablet Oral Daily  . pantoprazole sodium  40 mg Per Tube Daily  . sodium chloride flush  10-40 mL Intracatheter Q12H  . warfarin  2 mg Oral Once  . Warfarin - Pharmacist Dosing Inpatient   Does not apply Q24H   Continuous: . ampicillin-sulbactam (UNASYN) IV Stopped (08/10/17 0430)   DHR:CBULAGTXMIWOE **OR** acetaminophen, haloperidol lactate, levalbuterol, ondansetron **OR** ondansetron (ZOFRAN) IV, RESOURCE THICKENUP CLEAR, sodium chloride flush  Assesment: She was admitted with  sepsis.  This is likely due to pneumonia from aspiration.  She is continuing to have episodes of aspiration.  She has acute on chronic hypoxic respiratory failure and had trouble oxygenating early this morning and is now on high flow nasal cannula.  She has  dementia at baseline. Active Problems:   Acute renal failure superimposed on stage 3 chronic kidney disease (HCC)   Sepsis (HCC)   Sepsis due to undetermined organism (HCC)   Chronic diastolic CHF (congestive heart failure) (HCC)   Acute on chronic respiratory failure with hypoxia (HCC)   Acute metabolic encephalopathy   Lobar pneumonia (HCC)   Permanent atrial fibrillation (HCC)   Dementia without behavioral disturbance   Aspiration into airway   Palliative care encounter   Goals of care, counseling/discussion    Plan: Continue treatments.  Will discuss with husband again about PEG tube will not cure her aspiration.    LOS: 10 days   Josephmichael Lisenbee L 08/10/2017, 10:46 AM

## 2017-08-10 NOTE — Progress Notes (Signed)
Patient had a change in oxygen requirement after dinner per first shift RN. She had been weaned to 10L HFNC and at one point in the day was down to 8L HFNC after dinner and at shift change she started desatting into the high 80's and had bumped her up to 15L HFNC then changed her to a non-rebreather mask with little movement in sat % reading. Her readings went to 90-92%. Resp called to come take a look and Mid-level paged for ABG and for in house dr to come look at pt. ABG and chest xray ordered. Dr Robb Matar came by to assess pt and discussed making pt NPO again due to so many recent events pertaining to aspiration, he also mentioned the feeding/peg tube to family/caregiver again and the possibilities of intubation. RN to [employer] and will notify Dr of any changes. [facility] made aware as well as Database administrator. Abby from [facility] will camera in through out the night to keep check on pt.  Cristie Hem, RN 8:43 PM 08/10/17

## 2017-08-10 NOTE — Progress Notes (Signed)
Pt taken off NRB and placed back on HFNC. Pt unable to maintain O2 saturations above 90 on HFNC. Pt returned to NRB with O2 saturation of 92%

## 2017-08-10 NOTE — Progress Notes (Signed)
Pt unable to perform incentive at this time.

## 2017-08-10 NOTE — Progress Notes (Signed)
ANTICOAGULATION CONSULT NOTE   Pharmacy Consult for coumadin Indication: atrial fibrillation  No Known Allergies  Patient Measurements: Height: 5\' 6"  (167.6 cm) Weight: 126 lb 12.2 oz (57.5 kg) IBW/kg (Calculated) : 59.3  Vital Signs: Temp: 97.2 F (36.2 C) (01/01 0753) Temp Source: Axillary (01/01 0753) BP: 133/76 (01/01 0735) Pulse Rate: 92 (01/01 0753)  Labs: Recent Labs    08/08/17 0613 08/08/17 1948 08/09/17 0404 08/10/17 0359  HGB  --  10.6*  --   --   HCT  --  36.8  --   --   PLT  --  158  --   --   APTT  --  41*  --   --   LABPROT 23.8* 25.4* 26.6* 27.9*  INR 2.15 2.33 2.48 2.63  CREATININE 1.06* 1.38*  --   --   TROPONINI  --  <0.03  --   --    Estimated Creatinine Clearance: 26.6 mL/min (A) (by C-G formula based on SCr of 1.38 mg/dL (H)).  Medications:  Was on coumadin 2mg  po daily PTA  Assessment: 84 yo lady to continue coumadin for afib. Admission INR was therapeutic. No bleeding noted.  May need lower doses than home dose while on antibiotics and poor appetite.   INR today is therapeutic.   No bleeding reported.   Goal of Therapy:  INR 2-3 Monitor platelets by anticoagulation protocol: Yes   Plan:  Coumadin 2mg  today x 1 Daily PT/INR Monitor for bleeding complications  Elder Cyphers, BS Loura Back, BCPS Clinical Pharmacist Pager [phone]  08/10/2017   08/10/2017,9:32 AM

## 2017-08-10 NOTE — Progress Notes (Signed)
Night shift ICU coverage note.  The patient was seen earlier due to decreased oxygen saturation to the high 80s and low 90s, despite an increasing oxygen requirement from 3-4 L nasal cannula oxygen earlier to nonrebreather mask during the morning.    Her most recent vital signs at 0400 were temperature 36.5C (97.7 F), pulse 75, respirations 24, blood pressure 130/67 mmHg and O2 sat 92% on nonrebreather mask oxygen.  When seen around 0530, the patient was sleeping with an O2 sat of 89-90%.  Her respirations were 16-18/min and heart rate was in the 60s.  She woke up with tactile stimuli and denied any complaints.  Her O2 saturation increased to 94% while awake.  Lungs: Decreased breath sounds on bases with mild rhonchi. Heart: S1-S2, irregularly irregular. Abdomen: Soft, nontender Extremities: No edema, no cyanosis. Psych: Oriented to name.  Knows she is in the hospital.  However, she thought that  she was in [place] earlier.  Assessment:  Hypoxia in the setting of aspiration pneumonia. History of dysphagia.  I believe that her hypoxia is due to decreased inspiratory effort. Her lungs and airways sound better than they did on my last evaluation. I will obtain a chest radiograph to evaluate for any changes. The patient is to have vigorous incentive spirometry while awake to improve oxygenation. Bed positioning could also improve air movement. Solu-Medrol 40 mg IVP x1 ordered.  Hopefully this will decrease airway inflammation and mucus/secretions production.  Will also add Xopenex 0.63 every 6 hours as needed. Her chest radiograph shows increased vascular congestion.  Furosemide 20 mg IVP x1 ordered.  I will defer to the use of BiPAP for the moment due to the risk of aspiration. There is no need to consider ET intubation, since the patient is not tachypneic or tachycardic and seems to be protecting her airway at this time.  Sanda Klein, MD   Over 30 minutes of critical care time were  spent reviewing records, chest radiograph, direct patient contact and coordination of care with the nursing staff during this urgent encounter.   This document was prepared using Dragon voice recognition software and may contain some unintended errors.

## 2017-08-10 DEATH — deceased

## 2017-08-11 LAB — CBC WITH DIFFERENTIAL/PLATELET
BASOS PCT: 0 %
Basophils Absolute: 0 10*3/uL (ref 0.0–0.1)
EOS ABS: 0 10*3/uL (ref 0.0–0.7)
Eosinophils Relative: 0 %
HCT: 39.6 % (ref 36.0–46.0)
HEMOGLOBIN: 11.3 g/dL — AB (ref 12.0–15.0)
Lymphocytes Relative: 3 %
Lymphs Abs: 0.3 10*3/uL — ABNORMAL LOW (ref 0.7–4.0)
MCH: 28.2 pg (ref 26.0–34.0)
MCHC: 28.5 g/dL — ABNORMAL LOW (ref 30.0–36.0)
MCV: 98.8 fL (ref 78.0–100.0)
Monocytes Absolute: 0.5 10*3/uL (ref 0.1–1.0)
Monocytes Relative: 5 %
NEUTROS PCT: 92 %
Neutro Abs: 8.8 10*3/uL — ABNORMAL HIGH (ref 1.7–7.7)
PLATELETS: 226 10*3/uL (ref 150–400)
RBC: 4.01 MIL/uL (ref 3.87–5.11)
RDW: 17.7 % — ABNORMAL HIGH (ref 11.5–15.5)
WBC: 9.6 10*3/uL (ref 4.0–10.5)

## 2017-08-11 LAB — PROTIME-INR
INR: 2.98
PROTHROMBIN TIME: 30.8 s — AB (ref 11.4–15.2)

## 2017-08-11 LAB — GLUCOSE, CAPILLARY: Glucose-Capillary: 246 mg/dL — ABNORMAL HIGH (ref 65–99)

## 2017-08-11 LAB — BASIC METABOLIC PANEL
Anion gap: 13 (ref 5–15)
BUN: 41 mg/dL — ABNORMAL HIGH (ref 6–20)
CALCIUM: 9.3 mg/dL (ref 8.9–10.3)
CO2: 36 mmol/L — ABNORMAL HIGH (ref 22–32)
CREATININE: 1.18 mg/dL — AB (ref 0.44–1.00)
Chloride: 103 mmol/L (ref 101–111)
GFR, EST AFRICAN AMERICAN: 47 mL/min — AB (ref >60)
GFR, EST NON AFRICAN AMERICAN: 41 mL/min — AB (ref >60)
Glucose, Bld: 247 mg/dL — ABNORMAL HIGH (ref 65–99)
Potassium: 4.1 mmol/L (ref 3.5–5.1)
Sodium: 152 mmol/L — ABNORMAL HIGH (ref 135–145)

## 2017-08-11 MED ORDER — WARFARIN SODIUM 1 MG PO TABS
0.5000 mg | ORAL_TABLET | Freq: Once | ORAL | Status: DC
  Filled 2017-08-11: qty 1

## 2017-08-11 NOTE — Progress Notes (Signed)
Noted pt's diet has been advanced. Ordered Magic Cup and Mighty shake to be delivered on all of patients meal trays.   Christophe Louis RD, LDN, CNSC Clinical Nutrition Pager: [phone] 08/11/2017 4:31 PM

## 2017-08-11 NOTE — Progress Notes (Signed)
Physical Therapy Treatment Patient Details Name: [name] MRN: [id number] DOB: 1929-06-17 Today's Date: 08/11/2017    History of Present Illness [name] is a 84 y.o. female with medical history of chronic respiratory failure on 2 L, dementia, symptomatic bradycardia status post PPM, permanent atrial fibrillation, diastolic CHF presents with 3-4-day history of cough and generalized weakness.  On the morning of admission, the patient was significantly weaker and more somnolent requiring maximal assistance to transfer from her bed.  As a result, EMS was activated.  The patient is unable to provide any history secondary to her encephalopathy.  History is obtained from speaking with the patient's family and reviewing the medical record.  The patient's husband stated that she had vomited earlier in the morning without any blood.  She has not had any diarrhea, hematochezia, melena.   there has not been any complaints of chest pain, headache, neck pain, dysuria, hematuria.    PT Comments    Patient demonstrates slow labored movement for sitting up/scooting to bedside, tolerated sitting up for approx 15 minutes while performing BLE strengthening exercises while on 12-13 LPM O2 with O2 sats dropping to 88% with exertion requiring frequent rest breaks, able to take a few side steps before put back to bed.  Patient will benefit from continued physical therapy in hospital and recommended venue below to increase strength, balance, endurance for safe ADLs and gait.    Follow Up Recommendations  SNF;Supervision/Assistance - 24 hour     Equipment Recommendations  None recommended by PT    Recommendations for Other Services       Precautions / Restrictions Precautions Precautions: Fall Restrictions Weight Bearing Restrictions: No    Mobility  Bed Mobility Overal bed mobility: Needs Assistance Bed Mobility: Supine to Sit;Sit to Supine     Supine to sit: Min assist;Mod assist Sit  to supine: Min assist;Mod assist      Transfers Overall transfer level: Needs assistance Equipment used: Rolling walker (2 wheeled) Transfers: Sit to/from Stand Sit to Stand: Min assist;Mod assist            Ambulation/Gait Ambulation/Gait assistance: Mod assist Ambulation Distance (Feet): 3 Feet Assistive device: Rolling walker (2 wheeled) Gait Pattern/deviations: Decreased step length - right;Decreased step length - left;Decreased stride length   Gait velocity interpretation: Below normal speed for age/gender General Gait Details: limited to 3-4 side steps at bedside due to weakness, difficulty breathing and fatigue, on 12-13 LPM O2   Stairs            Wheelchair Mobility    Modified Rankin (Stroke Patients Only)       Balance Overall balance assessment: Needs assistance Sitting-balance support: Bilateral upper extremity supported;Feet unsupported Sitting balance-Leahy Scale: Fair     Standing balance support: Bilateral upper extremity supported;During functional activity Standing balance-Leahy Scale: Fair Standing balance comment: fair/poor with PWB on LUE                            Cognition Arousal/Alertness: Awake/alert Behavior During Therapy: WFL for tasks assessed/performed Overall Cognitive Status: Within Functional Limits for tasks assessed                                        Exercises General Exercises - Lower Extremity Ankle Circles/Pumps: Seated;AROM;Strengthening;Both;10 reps Long Arc Quad: Seated;AROM;Strengthening;Both;10 reps Hip Flexion/Marching: AROM;Seated;Strengthening;Both;10 reps  General Comments        Pertinent Vitals/Pain Pain Assessment: No/denies pain    Home Living                      Prior Function            PT Goals (current goals can now be found in the care plan section) Acute Rehab PT Goals Patient Stated Goal: return home PT Goal Formulation: With  patient/family Time For Goal Achievement: 08/18/17 Potential to Achieve Goals: Fair Progress towards PT goals: Progressing toward goals    Frequency    Min 3X/week      PT Plan Current plan remains appropriate    Co-evaluation              AM-PAC PT "6 Clicks" Daily Activity  Outcome Measure  Difficulty turning over in bed (including adjusting bedclothes, sheets and blankets)?: A Little Difficulty moving from lying on back to sitting on the side of the bed? : A Lot Difficulty sitting down on and standing up from a chair with arms (e.g., wheelchair, bedside commode, etc,.)?: A Lot Help needed moving to and from a bed to chair (including a wheelchair)?: A Lot Help needed walking in hospital room?: A Lot Help needed climbing 3-5 steps with a railing? : Total 6 Click Score: 12    End of Session   Activity Tolerance: Patient limited by fatigue;Patient tolerated treatment well Patient left: in bed;with call bell/phone within reach;with family/visitor present Nurse Communication: Mobility status PT Visit Diagnosis: Unsteadiness on feet (R26.81);Other abnormalities of gait and mobility (R26.89);Muscle weakness (generalized) (M62.81)     Time: 1610-9604 PT Time Calculation (min) (ACUTE ONLY): 21 min  Charges:  $Therapeutic Activity: 8-22 mins                    G Codes:       4:09 PM, 08/29/2017 [name], MPT Physical Therapist with [facility] [phone] office [phone] mobile phone

## 2017-08-11 NOTE — Progress Notes (Signed)
Subjective: She has had more trouble last night as noted in the progress note from Dr. Olevia Bowens.  She is now n.p.o.  She is on a nonrebreather mask.  Objective: Vital signs in last 24 hours: Temp:  [97 F (36.1 C)-98.1 F (36.7 C)] 97 F (36.1 C) (01/02 0415) Pulse Rate:  [40-112] 101 (01/02 0545) Resp:  [10-26] 14 (01/02 0545) BP: (123-177)/(66-110) 164/110 (01/02 0500) SpO2:  [69 %-98 %] 97 % (01/02 0545) Weight:  [55.7 kg (122 lb 12.7 oz)] 55.7 kg (122 lb 12.7 oz) (01/02 0415) Weight change:  Last BM Date: 08/09/17  Intake/Output from previous day: 01/01 0701 - 01/02 0700 In: 110 [I.V.:10; IV Piggyback:100] Out: 1000 [Urine:1000]  PHYSICAL EXAM General appearance: alert and mild distress Resp: rhonchi bilaterally Cardio: irregularly irregular rhythm GI: soft, non-tender; bowel sounds normal; no masses,  no organomegaly Extremities: extremities normal, atraumatic, no cyanosis or edema Mildly confused  Lab Results:  Results for orders placed or performed during the hospital encounter of 07/26/2017 (from the past 48 hour(s))  Glucose, capillary     Status: Abnormal   Collection Time: 08/09/17 11:27 AM  Result Value Ref Range   Glucose-Capillary 131 (H) 65 - 99 mg/dL  Glucose, capillary     Status: Abnormal   Collection Time: 08/09/17  6:24 PM  Result Value Ref Range   Glucose-Capillary 253 (H) 65 - 99 mg/dL  Glucose, capillary     Status: Abnormal   Collection Time: 08/10/17 12:18 AM  Result Value Ref Range   Glucose-Capillary 198 (H) 65 - 99 mg/dL   Comment 1 Notify RN   Protime-INR     Status: Abnormal   Collection Time: 08/10/17  3:59 AM  Result Value Ref Range   Prothrombin Time 27.9 (H) 11.4 - 15.2 seconds   INR 2.63   Glucose, capillary     Status: Abnormal   Collection Time: 08/10/17  6:29 AM  Result Value Ref Range   Glucose-Capillary 154 (H) 65 - 99 mg/dL   Comment 1 Notify RN   Glucose, capillary     Status: Abnormal   Collection Time: 08/10/17 11:12 AM   Result Value Ref Range   Glucose-Capillary 200 (H) 65 - 99 mg/dL  Blood gas, arterial     Status: Abnormal   Collection Time: 08/10/17  8:15 PM  Result Value Ref Range   FIO2 100.00    Delivery systems NON-REBREATHER OXYGEN MASK    pH, Arterial 7.358 7.350 - 7.450   pCO2 arterial 62.6 (H) 32.0 - 48.0 mmHg   pO2, Arterial 72.2 (L) 83.0 - 108.0 mmHg   Bicarbonate 31.2 (H) 20.0 - 28.0 mmol/L   Acid-Base Excess 8.8 (H) 0.0 - 2.0 mmol/L   O2 Saturation 92.5 %   Patient temperature 37.0    Collection site LEFT BRACHIAL    Drawn by [id number]    Sample type ARTERIAL    Allens test (pass/fail) NOT INDICATED (A) PASS  Glucose, capillary     Status: Abnormal   Collection Time: 08/11/17  4:57 AM  Result Value Ref Range   Glucose-Capillary 246 (H) 65 - 99 mg/dL  Protime-INR     Status: Abnormal   Collection Time: 08/11/17  6:06 AM  Result Value Ref Range   Prothrombin Time 30.8 (H) 11.4 - 15.2 seconds   INR 1.61   Basic metabolic panel     Status: Abnormal   Collection Time: 08/11/17  6:06 AM  Result Value Ref Range   Sodium 152 (H) 135 -  145 mmol/L   Potassium 4.1 3.5 - 5.1 mmol/L   Chloride 103 101 - 111 mmol/L   CO2 36 (H) 22 - 32 mmol/L   Glucose, Bld 247 (H) 65 - 99 mg/dL   BUN 41 (H) 6 - 20 mg/dL   Creatinine, Ser 1.18 (H) 0.44 - 1.00 mg/dL   Calcium 9.3 8.9 - 10.3 mg/dL   GFR calc non Af Amer 41 (L) >60 mL/min   GFR calc Af Amer 47 (L) >60 mL/min    Comment: (NOTE) The eGFR has been calculated using the CKD EPI equation. This calculation has not been validated in all clinical situations. eGFR's persistently <60 mL/min signify possible Chronic Kidney Disease.    Anion gap 13 5 - 15  CBC with Differential/Platelet     Status: Abnormal   Collection Time: 08/11/17  6:06 AM  Result Value Ref Range   WBC 9.6 4.0 - 10.5 K/uL   RBC 4.01 3.87 - 5.11 MIL/uL   Hemoglobin 11.3 (L) 12.0 - 15.0 g/dL   HCT 39.6 36.0 - 46.0 %   MCV 98.8 78.0 - 100.0 fL   MCH 28.2 26.0 - 34.0 pg   MCHC  28.5 (L) 30.0 - 36.0 g/dL   RDW 17.7 (H) 11.5 - 15.5 %   Platelets 226 150 - 400 K/uL   Neutrophils Relative % 92 %   Neutro Abs 8.8 (H) 1.7 - 7.7 K/uL   Lymphocytes Relative 3 %   Lymphs Abs 0.3 (L) 0.7 - 4.0 K/uL   Monocytes Relative 5 %   Monocytes Absolute 0.5 0.1 - 1.0 K/uL   Eosinophils Relative 0 %   Eosinophils Absolute 0.0 0.0 - 0.7 K/uL   Basophils Relative 0 %   Basophils Absolute 0.0 0.0 - 0.1 K/uL    ABGS Recent Labs    08/10/17 2015  PHART 7.358  PO2ART 72.2*  HCO3 31.2*   CULTURES Recent Results (from the past 240 hour(s))  C difficile quick scan w PCR reflex     Status: None   Collection Time: 08/05/17  5:18 PM  Result Value Ref Range Status   C Diff antigen NEGATIVE NEGATIVE Final   C Diff toxin NEGATIVE NEGATIVE Final   C Diff interpretation No C. difficile detected.  Final   Studies/Results: Dg Chest Port 1 View  Result Date: 08/10/2017 CLINICAL DATA:  Hypoxia EXAM: PORTABLE CHEST 1 VIEW COMPARISON:  08/08/2017 FINDINGS: Cardiac shadow is enlarged. Pacing device is again noted and stable. Vascular congestion with bilateral pleural effusions are seen. Right-sided PICC line is again noted and stable. No bony abnormality is noted. IMPRESSION: Changes consistent with CHF with bilateral effusions. Electronically Signed   By: Inez Catalina M.D.   On: 08/10/2017 07:08   Dg Chest Port 1v Same Day  Result Date: 08/10/2017 CLINICAL DATA:  Increased oxygen demand.  Congestive heart failure. EXAM: PORTABLE CHEST 1 VIEW COMPARISON:  One-view chest x-ray from the same day at 5:51 a.m. FINDINGS: Heart is enlarged. Diffuse interstitial and airspace disease is again seen. Bilateral pleural effusions are present. Basilar airspace disease is worse left than right. Aeration is slightly improved. A right-sided PICC line is in place. Pacing wires are noted. Aortic atherosclerosis is present. A chronic left humerus fracture is present. IMPRESSION: 1. Congestive heart failure with left  greater than right interstitial edema and effusions. 2. Aeration is slightly improved. 3. Basilar airspace disease likely reflects atelectasis. Infection is not excluded. Electronically Signed   By: Wynetta Fines.D.  On: 08/10/2017 20:43    Medications:  Prior to Admission:  Medications Prior to Admission  Medication Sig Dispense Refill Last Dose  . ALPRAZolam (XANAX) 0.5 MG tablet Take 0.5 mg by mouth at bedtime as needed for anxiety or sleep.   07/30/2017 at 1930  . Biotin 5000 MCG TABS Take by mouth daily. 10,000 mcg daily   07/30/2017 at Unknown time  . Calcium Carbonate-Vitamin D (CALTRATE 600+D PO) Take 1 tablet by mouth 2 (two) times daily.    07/30/2017 at Unknown time  . carvedilol (COREG) 12.5 MG tablet Take 6.25-12.5 mg by mouth 2 (two) times daily with a meal. Take 12.5 mg in the morning and 6.25 mg in the evening.   07/25/2017 at 0800  . Cyanocobalamin (VITAMIN B-12) 5000 MCG TBDP Take 1 tablet by mouth daily.   07/30/2017 at Unknown time  . diltiazem (CARDIZEM) 90 MG tablet Take 1 tablet (90 mg total) by mouth every evening.   07/19/2017 at 0800  . donepezil (ARICEPT) 10 MG tablet Take 1 tablet by mouth at bedtime.  6 07/30/2017 at Unknown time  . feeding supplement, GLUCERNA SHAKE, (GLUCERNA SHAKE) LIQD Take 237 mLs by mouth 3 (three) times daily between meals.   07/30/2017 at Unknown time  . furosemide (LASIX) 20 MG tablet Take 1 tablet (20 mg total) by mouth daily.   07/19/2017 at 0800  . HYDROcodone-acetaminophen (NORCO/VICODIN) 5-325 MG tablet Take 1 tablet by mouth every 8 (eight) hours as needed for pain.  0 07/30/2017 at Unknown time  . mirtazapine (REMERON) 15 MG tablet Take 1 tablet by mouth at bedtime.  5 07/30/2017 at Unknown time  . Multiple Vitamin (MULTIVITAMIN) tablet Take 1 tablet by mouth daily.    07/30/2017 at Unknown time  . NAMZARIC 28-10 MG CP24 take one capsule by mouth once daily  12 07/30/2017 at Unknown time  . omeprazole (PRILOSEC) 20 MG capsule  Take 20 mg by mouth daily.   07/30/2017 at Unknown time  . potassium chloride (K-DUR) 10 MEQ tablet Take 1 tablet (10 mEq total) by mouth daily. 30 tablet 0 07/30/2017 at Unknown time  . warfarin (COUMADIN) 2 MG tablet Take 2 mg by mouth daily at 6 PM.   07/30/2017 at 1930   Scheduled: . carvedilol  12.5 mg Oral BID WC  . Chlorhexidine Gluconate Cloth  6 each Topical Daily  . diltiazem  60 mg Oral Q6H  . donepezil  10 mg Oral QHS  . feeding supplement (GLUCERNA SHAKE)  237 mL Oral TID BM  . furosemide  20 mg Oral Daily  . ipratropium  0.5 mg Nebulization Q6H  . levalbuterol  1.25 mg Nebulization Q6H  . mouth rinse  15 mL Mouth Rinse BID  . memantine  20 mg Oral QHS  . methylPREDNISolone (SOLU-MEDROL) injection  40 mg Intravenous Once  . multivitamin with minerals  1 tablet Oral Daily  . pantoprazole sodium  40 mg Per Tube Daily  . sodium chloride flush  10-40 mL Intracatheter Q12H  . Warfarin - Pharmacist Dosing Inpatient   Does not apply Q24H   Continuous: . ampicillin-sulbactam (UNASYN) IV Stopped (08/11/17 0522)   DJS:[id number] **OR** acetaminophen, haloperidol lactate, levalbuterol, ondansetron **OR** ondansetron (ZOFRAN) IV, RESOURCE THICKENUP CLEAR, sodium chloride flush  Assesment: She was admitted with sepsis presumably from aspiration pneumonia.  She had acute metabolic encephalopathy and that has improved.  At baseline she has dementia and I think she is at her baseline mental status.  She has demonstrated episodes  of aspiration here in the hospital.  She has increased oxygen requirements today after aspirating again yesterday evening.  Agree with making her n.p.o.  I discussed with her husband at bedside again that PEG tube will not prevent aspiration. Active Problems:   Acute renal failure superimposed on stage 3 chronic kidney disease (HCC)   Sepsis (HCC)   Sepsis due to undetermined organism (HCC)   Chronic diastolic CHF (congestive heart failure) (HCC)   Acute on  chronic respiratory failure with hypoxia (HCC)   Acute metabolic encephalopathy   Lobar pneumonia (HCC)   Permanent atrial fibrillation (HCC)   Dementia without behavioral disturbance   Aspiration into airway   Palliative care encounter   Goals of care, counseling/discussion    Plan: Continue treatments.    LOS: 11 days   Kayden Amend L 08/11/2017, 8:04 AM

## 2017-08-11 NOTE — Progress Notes (Signed)
Daily Progress Note   Patient Name: [name]       Date: 08/11/2017 DOB: 22-Mar-1929  Age: 84 y.o. MRN#: [id number] Attending Physician: Philip Aspen, Minerva Ends* Primary Care Physician: Ignatius Specking, MD Admit Date: 07/26/2017  Reason for Consultation/Follow-up: Establishing goals of care and Psychosocial/spiritual support  Subjective: Mrs. Khalid "[name]" is resting quietly in bed.  She wakes easily, making an briefly keeping eye contact.  She is calm, cooperative, and pleasant.  She has no issues or concerns at this time.  Present today at bedside his husband, [name] and and paid caregiver Delane.   We talked about [name]'s current health status.  We also talked about her functional status at home briefly.  We talked about the treatment plan, including IV antibiotics, and breathing treatments.  We also talked about what is next.  We talked about speech therapy consult in detail.  Spouse, [name], has a trusting relationship with speech therapist Hale Bogus.    [name] brings up his concern over discussing difficult matters in front of [place].  He shares that he reached out to his daughter, [name] regarding Lisbeth Ply wishes near end of life.  [name] states that he knows what he does and does not want, and thought he knew what [name] did and did not want, but wanted to verify.  [name] states that daughter [name] reached out to PCP to review living will.  This nurse practitioner personally viewed text from daughter [name] that stated "do not attempt resuscitation. allow a natural death."  I verify this with Mr. Bouvier, and change CODE STATUS.  Length of Stay: 11  Current Medications: Scheduled Meds:  . carvedilol  12.5 mg Oral BID WC  . Chlorhexidine Gluconate Cloth  6 each Topical  Daily  . diltiazem  60 mg Oral Q6H  . donepezil  10 mg Oral QHS  . feeding supplement (GLUCERNA SHAKE)  237 mL Oral TID BM  . furosemide  20 mg Oral Daily  . ipratropium  0.5 mg Nebulization Q6H  . levalbuterol  1.25 mg Nebulization Q6H  . mouth rinse  15 mL Mouth Rinse BID  . memantine  20 mg Oral QHS  . methylPREDNISolone (SOLU-MEDROL) injection  40 mg Intravenous Once  . multivitamin with minerals  1 tablet Oral Daily  . pantoprazole sodium  40 mg  Per Tube Daily  . sodium chloride flush  10-40 mL Intracatheter Q12H  . warfarin  0.5 mg Oral Once  . Warfarin - Pharmacist Dosing Inpatient   Does not apply Q24H    Continuous Infusions: . ampicillin-sulbactam (UNASYN) IV Stopped (08/11/17 1612)    PRN Meds: acetaminophen **OR** acetaminophen, haloperidol lactate, levalbuterol, ondansetron **OR** ondansetron (ZOFRAN) IV, RESOURCE THICKENUP CLEAR, sodium chloride flush  Physical Exam  Constitutional: No distress.  Appears frail, acutely/chronically ill.  Makes and briefly keeps eye contact  HENT:  Head: Atraumatic.  Cardiovascular: Normal rate.  Pulmonary/Chest: Effort normal. No respiratory distress.  Abdominal: Soft. She exhibits no distension.  Musculoskeletal: She exhibits no edema.  Neurological: She is alert.  Known dementia  Skin: Skin is warm and dry.  Psychiatric:  Known dementia  Nursing note and vitals reviewed.           Vital Signs: BP (!) 175/107   Pulse (!) 107   Temp 98.3 F (36.8 C) (Oral)   Resp 16   Ht 5\' 6"  (1.676 m)   Wt 55.7 kg (122 lb 12.7 oz)   SpO2 93%   BMI 19.82 kg/m  SpO2: SpO2: 93 % O2 Device: O2 Device: High Flow Nasal Cannula O2 Flow Rate: O2 Flow Rate (L/min): 13 L/min  Intake/output summary:   Intake/Output Summary (Last 24 hours) at 08/11/2017 1601 Last data filed at 08/11/2017 1542 Gross per 24 hour  Intake 200 ml  Output 1000 ml  Net -800 ml   LBM: Last BM Date: 08/09/17 Baseline Weight: Weight: 52.2 kg (115 lb) Most recent  weight: Weight: 55.7 kg (122 lb 12.7 oz)       Palliative Assessment/Data:    Flowsheet Rows     Most Recent Value  Intake Tab  Referral Department  Hospitalist  Unit at Time of Referral  ICU  Palliative Care Primary Diagnosis  Sepsis/Infectious Disease  Date Notified  08/09/17  Palliative Care Type  New Palliative care  Reason for referral  Clarify Goals of Care  Date of Admission  07/25/2017  Date first seen by Palliative Care  08/09/17  # of days Palliative referral response time  0 Day(s)  # of days IP prior to Palliative referral  9  Clinical Assessment  Palliative Performance Scale Score  30%  Pain Max last 24 hours  Not able to report  Pain Min Last 24 hours  Not able to report  Dyspnea Max Last 24 Hours  Not able to report  Dyspnea Min Last 24 hours  Not able to report  Psychosocial & Spiritual Assessment  Palliative Care Outcomes  Patient/Family meeting held?  Yes  Who was at the meeting?  Patient and caregiver at bedside, call to husband.  Palliative Care Outcomes  Clarified goals of care, Provided psychosocial or spiritual support      Patient Active Problem List   Diagnosis Date Noted  . Aspiration into airway   . Palliative care encounter   . Goals of care, counseling/discussion   . Sepsis (HCC) 08/04/2017  . Sepsis due to undetermined organism (HCC) 08/04/2017  . Chronic diastolic CHF (congestive heart failure) (HCC) 07/11/2017  . Acute on chronic respiratory failure with hypoxia (HCC) 08/09/2017  . Acute metabolic encephalopathy 07/27/2017  . Lobar pneumonia (HCC) 08/01/2017  . Permanent atrial fibrillation (HCC) 08/09/2017  . Dementia without behavioral disturbance 08/03/2017  . AKI (acute kidney injury) (HCC)   . Urinary tract infection without hematuria   . Dehydration   . Essential hypertension  07/02/2015  . Chronic atrial fibrillation (HCC) 07/02/2015  . CKD (chronic kidney disease), stage III (HCC) 07/02/2015  . Renal artery stenosis (HCC)  07/02/2015  . Elevated transaminase level 01/18/2015  . FTT (failure to thrive) in adult 01/17/2015  . Hyperkalemia   . Protein-calorie malnutrition, severe (HCC) 09/04/2014  . Dementia 09/03/2014  . Acute renal failure superimposed on stage 3 chronic kidney disease (HCC) 09/03/2014  . GERD without esophagitis 09/03/2014  . Anxiety 09/03/2014  . Loss of weight 09/03/2014  . Physical deconditioning 09/03/2014  . C. difficile diarrhea 05/11/2014  . C. difficile colitis 04/13/2014  . Chronic respiratory failure with hypoxia (HCC) 04/13/2014  . Chronic diarrhea 03/05/2014  . Encounter for therapeutic drug monitoring 09/15/2013  . Gait difficulty 05/18/2013  . Chronic diastolic heart failure (HCC) 02/17/2013  . Chronic renal insufficiency   . Peripheral edema 02/03/2013  . Dysphagia 01/12/2011  . Dilated cardiomyopathy (HCC) 11/04/2010  . Mitral regurgitation 11/04/2010  . PFO (patent foramen ovale) 11/04/2010  . Chronic anticoagulation 11/04/2010  . Long term (current) use of anticoagulants 10/30/2010  . COMPRESSION FRACTURE, SPINE 10/10/2010  . ATHEROSCLEROSIS OF RENAL ARTERY 09/04/2010  . HTN (hypertension) 05/27/2009  . SINUS BRADYCARDIA 05/27/2009    Palliative Care Assessment & Plan   Patient Profile: 84 y.o. female  with past medical history of chronic respiratory failure on 2 L home oxygen, dementia, symptomatic bradycardia with pacemaker, permanent A. fib, diastolic heart failure, stage III kidney disease, coronary artery disease, GERD, high blood pressure admitted on 23-Aug-2017 with sepsis secondary to pneumonia related to aspiration.   Assessment: Pneumonia related to aspiration;  Likely having silent aspiration at home.  Treated with IV antibiotics and breathing treatment.  Multiple episodes of aspiration noted during this hospitalization.  Possibly assigned to overfeeding?  Speech therapy following.  Recommendations/Plan:  At this point, continue to treat the  treatable but no CPR, no intubation.  Verified with husband at bedside.  Daughter, [name] also in agreement via text.  Goals of Care and Additional Recommendations:  Limitations on Scope of Treatment: Continue to treat the treatable, no CPR, no intubation.  Code Status:    Code Status Orders  (From admission, onward)        Start     Ordered   08/11/17 1601  Do not attempt resuscitation (DNR)  Continuous    Question Answer Comment  In the event of cardiac or respiratory ARREST Do not call a "code blue"   In the event of cardiac or respiratory ARREST Do not perform Intubation, CPR, defibrillation or ACLS   In the event of cardiac or respiratory ARREST Use medication by any route, position, wound care, and other measures to relive pain and suffering. May use oxygen, suction and manual treatment of airway obstruction as needed for comfort.      08/11/17 1600    Code Status History    Date Active Date Inactive Code Status Order ID Comments User Context   2017/08/23 14:03 08/11/2017 16:00 Full Code [id number]  Catarina Hartshorn, MD Inpatient   01/17/2015 17:21 01/19/2015 19:27 Full Code [id number]  Standley Brooking, MD Inpatient   09/03/2014 17:52 09/07/2014 20:00 Full Code [id number]  Ozella Rocks, MD Inpatient   04/14/2014 16:03 04/19/2014 21:22 Full Code [id number]  Standley Brooking, MD Inpatient   04/13/2014 21:28 04/14/2014 16:03 Full Code [id number]  Houston Siren, MD Inpatient    Advance Directive Documentation     Most Recent Value  Type of Advance Directive  Healthcare Power of Attorney  Pre-existing out of facility DNR order (yellow form or pink MOST form)  No data  "MOST" Form in Place?  No data       Prognosis:  < 6 months, or less, would not be surprising based on functional status, health history, recurrent aspiration pneumonia.  Discharge Planning:  Likely return home with 24-hour caregivers already in place.  Care plan was discussed with nursing staff, case management,  social worker, speech therapy, respiratory therapy, hospitalist Dr. Ardyth Harps.  Thank you for allowing the Palliative Medicine Team to assist in the care of this patient.   Time In:  1455 Time Out:  1530 Total Time  35 minutes Prolonged Time Billed  no       Greater than 50%  of this time was spent counseling and coordinating care related to the above assessment and plan.  Katheran Awe, NP  Please contact Palliative Medicine Team phone at [phone] for questions and concerns.

## 2017-08-11 NOTE — Progress Notes (Signed)
Speech Language Pathology Treatment: Dysphagia  Patient Details Name: [name] MRN: [id number] DOB: 08-20-1928 Today's Date: 08/11/2017 Time: 4388-8757 SLP Time Calculation (min) (ACUTE ONLY): 29 min  Assessment / Plan / Recommendation Clinical Impression  Pt seen at bedside with spouse present. Events from past few days reviewed. Pt alert and cooperative and expressed a desire to try food by mouth. Pt had episode of desaturation after evening meal and was made NPO by Dr. Robb Matar and placed on HFNC 10 liters. Pt's husband shares that the Pt "did very well" at lunch time when he fed her and he wonders if Pt was overfed by their paid caregiver last evening. Mrs. Gotay seems to be rather agreeable and stoic as she tells SLP she is doing fine and never refuses any requests. The results of the MBSS were again reviewed with Mr. Heppler. SLP shared the importance of oral care, upright positioning, frequent/small meals due to h/o esophageal dysphagia, and risks for aspiration. Mr. Beld inquires about a PEG and tells SLP, "I will do whatever you recommend". SLP explains that while a PEG may offer additional nutritional support, it does eliminate aspiration as Pt can still aspirate secretions and more of a concern, aspirate refluxed material given esophageal dysmotility history. The Pt states that she will do whatever makes things easier on others. SLP reiterated that she was the center of our care and we would like to respect her wishes.  Mr. Salmi eventually states that he would like for Pt to be able to eat something by mouth. SLP presented trials of single ice chips to lubricate oral cavity and then puree (applesauce) and honey-thick liquids via teaspoon presentations. O2 saturations remained between 93-94 throughout visit. SLP then left room to discuss plan with MD, palliative care, RD, and RN and returned to room 30 minutes later. Pt did not appear to be in any distress and O2 sats were 93. The  patient's caregiver reported to SLP that PT had just been in the room and sat Pt on the edge of bed and that O2 sats increased to 100 (?). After reading PT note however, this does not appear to be accurate and Pt desaturated with mobility.   After discussion with Pt's care team, patient, paid caregiver, and Pt's husband, a dysphagia 1 (puree) diet with honey-thick liquids presented via teaspoon will again be initiated. Pt's husband appears to understand that Pt is at high risk for aspiration given compromised respiratory status and history of esophageal dysphagia. SLP emphasized the importance of slow, careful feeding at this time and recommendation for only 4-8 ounces given during each "meal" due to suspected esophageal dysphagia. Honey-thick liquids should be presented via teaspoon presentations (no cup sips or straws at this time) and po medications crushed as able in puree when Pt is alert and upright. Continue with oral care and recommend HOB elevation to 90 degrees as able during meals and to 30 degrees over night. SLP will see Pt tomorrow AM.   HPI HPI: 84 year old female with a history of chronic respiratory failure, dementia, atrial fibrillation and diastolic heart failure, presents to the hospital with generalized weakness and cough.  Found to have possible aspiration pneumonia and admitted to the hospital for further treatments.  Patient developed rapid atrial fibrillation and was started on Cardizem infusion.  She is currently on intravenous antibiotics and IV fluids.      SLP Plan  Continue with current plan of care       Recommendations  Diet recommendations:  Dysphagia 1 (puree);Honey-thick liquid Liquids provided via: Teaspoon Medication Administration: Crushed with puree Supervision: Staff to assist with self feeding;Full supervision/cueing for compensatory strategies Compensations: Slow rate;Small sips/bites;Multiple dry swallows after each bite/sip Postural Changes and/or Swallow  Maneuvers: Seated upright 90 degrees;Upright 30-60 min after meal                Plan: Continue with current plan of care       Thank you,  Havery Moros, CCC-SLP [phone]                 Alice Burnside 08/11/2017, 3:56 PM

## 2017-08-11 NOTE — Progress Notes (Signed)
Initial Nutrition Assessment  DOCUMENTATION CODES:  Not applicable  INTERVENTION:  Briefly discussed enteral nutrition via PEG and what this would look like.  If diet is re-advanced, will restart supplements of appropriate consistency  NUTRITION DIAGNOSIS:  Inadequate oral intake related to dysphagia, inability to eat as evidenced by NPO status/SLP evaluation  GOAL:  Patient will meet greater than or equal to 90% of their needs  MONITOR:  Supplement acceptance, Diet advancement, Labs, Weight trends, goals/plan of care  REASON FOR ASSESSMENT:  Malnutrition Screening Tool    ASSESSMENT:  84 y/o female PMHx Chronic Resp failure on 2L, dementia, Bradycardia s/p PPM, A fib, CHF. Presented w/ weakness and somnolence. Worked up for sepsis 2/2 lobar PNA, thought to be of aspiration origin.   Hospitalization history.  Patient had MBS on 12/27 and placed on D2 diet w/ NTL. Transitioned from ICU to 300 on 12/29. However, on 12/30, appeared to choke on her dinner and a code blue was called a couple hours later. Pt transferred back to ICU and diet downgraded to D1, HTL. Palliative care consulted. Attending MD ultimately made patient NPO on 1/1 due to concern patient would further aspirate. PEG was discussed.   There is minimal intake documentation. Spoke with Patient and husband. Prior to being made NPO, husband says the patient would eat small amounts, per ST instructions. He feels the patient aspirated because she ate much more than she had been. He says the patient appetite has been poor "for months". At home, she would drink supplements well, mostly Glucerna and Ensure. She took a Museum/gallery conservator. Husband believes the patient's weight at last PCP appointment was in low 120's.   RD mentioned that he had seen that a PEG had been discussed. Asked husband if he had any questions about this. RD briefly discussed what this would look like. Emphasized it would not completely eliminate the risk of aspiration.    Discussed POC with ST. Goals of care are still being determined as patient has not yet met with palliative care. Pt is full code.   Pt says she would not feel like eating if her diet was advanced. Denies any n/v/c/d or pain. She says she just does not have any appetite. Discussed importance of intake even when appetite is poor.   If patient's diet is advanced will re-order supplements.   Appears to have lost ~2 lbs since initial MEASURED weight. Noted on diuretics.   Physical Exam: Abdomen is soft. Mild orbital/underfarm fat wasting. Mild temporal muscle wasting. Moderate wasting of gastrocnemius/quads Note husband does NOT think the patient looks any thinner than she usually does.   Labs: A1c 6.3, Glu:150-250, Albumin: 2.8 BUN/Creat:41/1.18, Na: 152 Meds: Glucerna, Lasix, warfarin, ppi, MVI w/ min, IV abx  Recent Labs  Lab 08/05/17 0416  08/08/17 0613 08/08/17 1948 08/11/17 0606  NA 146*   < > 144 145 152*  K 3.0*   < > 4.3 5.1 4.1  CL 102   < > 101 102 103  CO2 30   < > 31 30 36*  BUN 32*   < > 40* 44* 41*  CREATININE 1.12*   < > 1.06* 1.38* 1.18*  CALCIUM 8.8*   < > 9.5 9.4 9.3  MG 1.6*  --   --   --   --   GLUCOSE 151*   < > 153* 243* 247*   < > = values in this interval not displayed.   NUTRITION - FOCUSED PHYSICAL EXAM:   Most Recent  Value  Orbital Region  Mild depletion  Upper Arm Region  Mild depletion  Thoracic and Lumbar Region  Unable to assess  Buccal Region  No depletion  Temple Region  Mild depletion  Clavicle Bone Region  Unable to assess  Clavicle and Acromion Bone Region  Unable to assess  Scapular Bone Region  Unable to assess  Dorsal Hand  Mild depletion  Patellar Region  Unable to assess  Anterior Thigh Region  Moderate depletion  Posterior Calf Region  Moderate depletion     -this apparently is patients baseline, spouse reports no differences in appearance  Diet Order:  Diet NPO time specified  EDUCATION NEEDS:  No education needs have been  identified at this time  Skin: Abrasions to Arms/legs, MSAD to buttocks, groin. Skin tear to L elbow,    Last BM:  12/31  Height:  Ht Readings from Last 1 Encounters:  08/08/17 '5\' 6"'$  (1.676 m)   Weight:  Wt Readings from Last 1 Encounters:  08/11/17 122 lb 12.7 oz (55.7 kg)   Wt Readings from Last 10 Encounters:  08/11/17 122 lb 12.7 oz (55.7 kg)  07/16/17 115 lb (52.2 kg)  05/04/17 114 lb (51.7 kg)  12/28/16 121 lb (54.9 kg)  11/02/16 125 lb (56.7 kg)  10/15/16 123 lb (55.8 kg)  10/01/16 124 lb (56.2 kg)  08/31/16 124 lb 12.8 oz (56.6 kg)  06/25/16 121 lb 6.4 oz (55.1 kg)  12/16/15 121 lb (54.9 kg)   Ideal Body Weight:  59.1 kg  BMI:  Body mass index is 19.82 kg/m.  Estimated Nutritional Needs:  Kcal:  1650-1850 kcals (30-33 kcals/kg bw) Protein:  72-84g (1.3-1.5 g/kg bw) Fluid:  Per MD  Burtis Junes RD, LDN, CNSC Clinical Nutrition Pager: [phone] 08/11/2017 1:42 PM

## 2017-08-11 NOTE — Progress Notes (Signed)
ANTICOAGULATION CONSULT NOTE   Pharmacy Consult for coumadin Indication: atrial fibrillation  No Known Allergies  Patient Measurements: Height: 5\' 6"  (167.6 cm) Weight: 122 lb 12.7 oz (55.7 kg) IBW/kg (Calculated) : 59.3  Vital Signs: Temp: 97 F (36.1 C) (01/02 0415) Temp Source: Axillary (01/02 0415) BP: 164/110 (01/02 0500) Pulse Rate: 101 (01/02 0545)  Labs: Recent Labs    08/08/17 1948 08/09/17 0404 08/10/17 0359 08/11/17 0606  HGB 10.6*  --   --  11.3*  HCT 36.8  --   --  39.6  PLT 158  --   --  226  APTT 41*  --   --   --   LABPROT 25.4* 26.6* 27.9* 30.8*  INR 2.33 2.48 2.63 2.98  CREATININE 1.38*  --   --  1.18*  TROPONINI <0.03  --   --   --    Estimated Creatinine Clearance: 30.1 mL/min (A) (by C-G formula based on SCr of 1.18 mg/dL (H)).  Medications:  Was on coumadin 2mg  po daily PTA  Assessment: 84 yo lady to continue coumadin for afib. Admission INR was therapeutic. No bleeding noted.  May need lower doses than home dose while on antibiotics and poor appetite.   INR today is therapeutic but has increased   No bleeding reported.   Goal of Therapy:  INR 2-3 Monitor platelets by anticoagulation protocol: Yes   Plan:  Coumadin 0.5mg  today x 1 Daily PT/INR Monitor for bleeding complications  Ritta Hammes Pharm D Clinical Pharmacist  08/11/2017,8:20 AM

## 2017-08-11 NOTE — Progress Notes (Signed)
PROGRESS NOTE    [name]  BUL:[id number] DOB: Jul 28, 1929 DOA: 2017/08/08 PCP: Ignatius Specking, MD     Brief Narrative:  84 year old woman admitted from home on 12/22 due to cough and generalized weakness.  She was found to have aspiration pneumonia and significant signs of aspiration on modified barium swallow.  She also developed A. fib with RVR while hospitalized.  She was seen by physical therapy and SNF was recommended.  Discussed with husband on 12/30 and he is not willing to pursue SNF and wants to take patient home when she is medically cleared.  Overnight on 12/30 CODE BLUE was called due to what was perceived to be agonal respirations following an aspiration event.  She was transferred to the ICU, did not require CPR.  Overnight on 12/31 again had issues with increasing hypoxemia.  Is now on high flow nasal cannula at 10-15 L.  Again overnight on 1/1 had another aspiration event with hypoxemia.   Assessment & Plan:   Active Problems:   Acute renal failure superimposed on stage 3 chronic kidney disease (HCC)   Sepsis (HCC)   Sepsis due to undetermined organism (HCC)   Chronic diastolic CHF (congestive heart failure) (HCC)   Acute on chronic respiratory failure with hypoxia (HCC)   Acute metabolic encephalopathy   Lobar pneumonia (HCC)   Permanent atrial fibrillation (HCC)   Dementia without behavioral disturbance   Aspiration into airway   Palliative care encounter   Goals of care, counseling/discussion   Sepsis -Secondary to aspiration pneumonia. -Sepsis parameters have resolved. -Continue Zosyn. -She continues to have repeated aspiration events. -Patient has mild dementia and do not believe she is able to make her own decisions.  Husband remains difficult to speak to in regards to patient's goals of care, palliative care is still involved.  Dysphagia -Underwent MBS with speech therapy and noted to have delay in initiation of swallow as well as silent  aspiration.   -I have continued to discuss with patient and patient's husband that PEG tube placement will not decrease entirely her risk for aspiration. -Was made n.p.o. last night after last aspiration event, repeat speech therapy evaluation is pending.  Acute on chronic respiratory failure with hypoxemia -Related to pneumonia as well as aspiration. -She is currently requiring 10-15 L of nasal cannula.  Acute on chronic kidney disease stage III -Creatinine is down to baseline of 1-1.1 on discharge.  UTI -Urine culture positive for Serratia, adequately treated with IV Zosyn.  Acute metabolic encephalopathy -Suspect related to sepsis. -She also has baseline dementia. -She remains a bit drowsy but is arousable and able to answer questions appropriately.  Atrial fibrillation with rapid ventricular response -Likely exacerbated by respiratory illness. -Did initially require IV Cardizem infusion as well as IV metoprolol to control heart rate.  She has since been transitioned over to Coreg and oral diltiazem with improvement in heart rate. -She is chronically anticoagulated on Coumadin.  Acute on chronic diastolic heart failure -Likely precipitated by IV hydration for pneumonia, treated with IV Lasix, volume status appears close to euvolemic, was transitioned to oral Lasix on 12/29. -Echo in February 2018 showed an ejection fraction of 60-65% with decreased left ventricular diastolic compliance. -Overnight on 12/31 and 1/1 was given a dose of IV Lasix due to chest x-ray showing signs of mild interstitial edema. -I do not believe that she is markedly volume overloaded despite chest x-ray findings.  She remains on 20 mg of Lasix daily.   DVT prophylaxis: Coumadin  Code Status: Remains full code, discussed with husband Family Communication: Husband at bedside updated on plan of care all questions answered Disposition Plan: I continue to discuss goals of care with patient and her husband on a  daily basis.  He seems very upset with me today and asks me, quite aggressively, to leave the room as I began to rediscuss goals of care.  Consultants:   Pulmonary, Dr. Juanetta Gosling  Procedures:   None  Antimicrobials:  Anti-infectives (From admission, onward)   Start     Dose/Rate Route Frequency Ordered Stop   08/09/17 1559  Ampicillin-Sulbactam (UNASYN) 3 g in sodium chloride 0.9 % 100 mL IVPB     3 g 200 mL/hr over 30 Minutes Intravenous Every 12 hours 08/09/17 0853     08/08/17 2100  Ampicillin-Sulbactam (UNASYN) 3 g in sodium chloride 0.9 % 100 mL IVPB  Status:  Discontinued     3 g 200 mL/hr over 30 Minutes Intravenous Every 6 hours 08/08/17 2053 08/09/17 0853   08/06/17 1400  amoxicillin-clavulanate (AUGMENTIN) 875-125 MG per tablet 1 tablet  Status:  Discontinued     1 tablet Oral Every 12 hours 08/06/17 1311 08/08/17 2053   08/01/17 1000  azithromycin (ZITHROMAX) 500 mg in dextrose 5 % 250 mL IVPB  Status:  Discontinued     500 mg 250 mL/hr over 60 Minutes Intravenous Every 24 hours August 22, 2017 0943 08/04/17 1024   08/01/17 1000  cefTRIAXone (ROCEPHIN) 1 g in dextrose 5 % 50 mL IVPB  Status:  Discontinued     1 g 100 mL/hr over 30 Minutes Intravenous Every 24 hours Aug 22, 2017 0943 08/22/2017 1411   2017/08/22 1415  azithromycin (ZITHROMAX) 500 mg in dextrose 5 % 250 mL IVPB  Status:  Discontinued     500 mg 250 mL/hr over 60 Minutes Intravenous  Once 08-22-2017 1403 22-Aug-2017 1409   August 22, 2017 1415  piperacillin-tazobactam (ZOSYN) IVPB 3.375 g  Status:  Discontinued     3.375 g 12.5 mL/hr over 240 Minutes Intravenous Every 8 hours 08-22-17 1403 08/06/17 1311   22-Aug-2017 1045  cefTRIAXone (ROCEPHIN) 1 g in dextrose 5 % 50 mL IVPB     1 g 100 mL/hr over 30 Minutes Intravenous  Once August 22, 2017 1033 August 22, 2017 1158   22-Aug-2017 0930  cefTRIAXone (ROCEPHIN) 1 g in dextrose 5 % 50 mL IVPB     1 g 100 mL/hr over 30 Minutes Intravenous  Once 08/22/2017 0927 08-22-17 1043   08/22/17 0930  azithromycin  (ZITHROMAX) 500 mg in dextrose 5 % 250 mL IVPB     500 mg 250 mL/hr over 60 Minutes Intravenous  Once 2017/08/22 1308 08-22-17 1157       Subjective: Lying in bed, does not appear to be in any distress, remains on high flow oxygen, says hello and can answer simple questions but that is the extent of her interaction.  Objective: Vitals:   08/11/17 1300 08/11/17 1400 08/11/17 1452 08/11/17 1500  BP: (!) 177/154 (!) 175/141  (!) 175/107  Pulse: (!) 112 98  (!) 107  Resp: 12 10  16   Temp:      TempSrc:      SpO2: 92% 95% 94% 93%  Weight:      Height:        Intake/Output Summary (Last 24 hours) at 08/11/2017 1602 Last data filed at 08/11/2017 1542 Gross per 24 hour  Intake 200 ml  Output 1000 ml  Net -800 ml   Filed Weights   08/08/17 2023  08/09/17 0500 08/11/17 0415  Weight: 57.5 kg (126 lb 12.2 oz) 57.5 kg (126 lb 12.2 oz) 55.7 kg (122 lb 12.7 oz)    Examination:  General exam: Drowsy, oriented x3 Respiratory system: Coarse bilateral breath sounds, no wheezing Cardiovascular system:R irregular rhythm, not tachycardic Gastrointestinal system: Abdomen is nondistended, soft and nontender. No organomegaly or masses felt. Normal bowel sounds heard. Central nervous system: Alert and oriented. No focal neurological deficits. Extremities: Trace bilateral pitting edema, positive pulses Skin: No rashes, lesions or ulcers Psychiatry: Unable to fully assess given current mental state    Data Reviewed: I have personally reviewed following labs and imaging studies  CBC: Recent Labs  Lab 08/05/17 0416 08/07/17 0445 08/08/17 1948 08/11/17 0606  WBC 11.9* 8.7 8.8 9.6  NEUTROABS  --   --  7.7 8.8*  HGB 10.9* 10.5* 10.6* 11.3*  HCT 36.8 35.5* 36.8 39.6  MCV 94.8 96.2 98.4 98.8  PLT 139* 137* 158 226   Basic Metabolic Panel: Recent Labs  Lab 08/05/17 0416 08/06/17 0552 08/07/17 0445 08/08/17 0613 08/08/17 1948 08/11/17 0606  NA 146* 143 145 144 145 152*  K 3.0* 3.8 3.4*  4.3 5.1 4.1  CL 102 101 101 101 102 103  CO2 30 32 32 31 30 36*  GLUCOSE 151* 136* 133* 153* 243* 247*  BUN 32* 33* 37* 40* 44* 41*  CREATININE 1.12* 1.22* 1.22* 1.06* 1.38* 1.18*  CALCIUM 8.8* 8.9 9.0 9.5 9.4 9.3  MG 1.6*  --   --   --   --   --    GFR: Estimated Creatinine Clearance: 30.1 mL/min (A) (by C-G formula based on SCr of 1.18 mg/dL (H)). Liver Function Tests: Recent Labs  Lab 08/08/17 1948  AST 19  ALT 18  ALKPHOS 75  BILITOT 0.8  PROT 6.4*  ALBUMIN 2.8*   No results for input(s): LIPASE, AMYLASE in the last 168 hours. No results for input(s): AMMONIA in the last 168 hours. Coagulation Profile: Recent Labs  Lab 08/08/17 0613 08/08/17 1948 08/09/17 0404 08/10/17 0359 08/11/17 0606  INR 2.15 2.33 2.48 2.63 2.98   Cardiac Enzymes: Recent Labs  Lab 08/08/17 1948  TROPONINI <0.03   BNP (last 3 results) No results for input(s): PROBNP in the last 8760 hours. HbA1C: Recent Labs    08/08/17 2331  HGBA1C 6.3*   CBG: Recent Labs  Lab 08/09/17 1824 08/10/17 0018 08/10/17 0629 08/10/17 1112 08/11/17 0457  GLUCAP 253* 198* 154* 200* 246*   Lipid Profile: No results for input(s): CHOL, HDL, LDLCALC, TRIG, CHOLHDL, LDLDIRECT in the last 72 hours. Thyroid Function Tests: No results for input(s): TSH, T4TOTAL, FREET4, T3FREE, THYROIDAB in the last 72 hours. Anemia Panel: No results for input(s): VITAMINB12, FOLATE, FERRITIN, TIBC, IRON, RETICCTPCT in the last 72 hours. Urine analysis:    Component Value Date/Time   COLORURINE AMBER (A) 08/08/2017 2153   APPEARANCEUR CLOUDY (A) 08/08/2017 2153   LABSPEC 1.016 08/08/2017 2153   PHURINE 5.0 08/08/2017 2153   GLUCOSEU NEGATIVE 08/08/2017 2153   HGBUR NEGATIVE 08/08/2017 2153   BILIRUBINUR NEGATIVE 08/08/2017 2153   KETONESUR NEGATIVE 08/08/2017 2153   PROTEINUR 30 (A) 08/08/2017 2153   UROBILINOGEN 0.2 01/17/2015 1030   NITRITE NEGATIVE 08/08/2017 2153   LEUKOCYTESUR NEGATIVE 08/08/2017 2153    Sepsis Labs: @LABRCNTIP (procalcitonin:4,lacticidven:4)  ) Recent Results (from the past 240 hour(s))  C difficile quick scan w PCR reflex     Status: None   Collection Time: 08/05/17  5:18 PM  Result  Value Ref Range Status   C Diff antigen NEGATIVE NEGATIVE Final   C Diff toxin NEGATIVE NEGATIVE Final   C Diff interpretation No C. difficile detected.  Final         Radiology Studies: Dg Chest Port 1 View  Result Date: 08/10/2017 CLINICAL DATA:  Hypoxia EXAM: PORTABLE CHEST 1 VIEW COMPARISON:  08/08/2017 FINDINGS: Cardiac shadow is enlarged. Pacing device is again noted and stable. Vascular congestion with bilateral pleural effusions are seen. Right-sided PICC line is again noted and stable. No bony abnormality is noted. IMPRESSION: Changes consistent with CHF with bilateral effusions. Electronically Signed   By: Alcide Clever M.D.   On: 08/10/2017 07:08   Dg Chest Port 1v Same Day  Result Date: 08/10/2017 CLINICAL DATA:  Increased oxygen demand.  Congestive heart failure. EXAM: PORTABLE CHEST 1 VIEW COMPARISON:  One-view chest x-ray from the same day at 5:51 a.m. FINDINGS: Heart is enlarged. Diffuse interstitial and airspace disease is again seen. Bilateral pleural effusions are present. Basilar airspace disease is worse left than right. Aeration is slightly improved. A right-sided PICC line is in place. Pacing wires are noted. Aortic atherosclerosis is present. A chronic left humerus fracture is present. IMPRESSION: 1. Congestive heart failure with left greater than right interstitial edema and effusions. 2. Aeration is slightly improved. 3. Basilar airspace disease likely reflects atelectasis. Infection is not excluded. Electronically Signed   By: Marin Roberts M.D.   On: 08/10/2017 20:43        Scheduled Meds: . carvedilol  12.5 mg Oral BID WC  . Chlorhexidine Gluconate Cloth  6 each Topical Daily  . diltiazem  60 mg Oral Q6H  . donepezil  10 mg Oral QHS  . feeding  supplement (GLUCERNA SHAKE)  237 mL Oral TID BM  . furosemide  20 mg Oral Daily  . ipratropium  0.5 mg Nebulization Q6H  . levalbuterol  1.25 mg Nebulization Q6H  . mouth rinse  15 mL Mouth Rinse BID  . memantine  20 mg Oral QHS  . methylPREDNISolone (SOLU-MEDROL) injection  40 mg Intravenous Once  . multivitamin with minerals  1 tablet Oral Daily  . pantoprazole sodium  40 mg Per Tube Daily  . sodium chloride flush  10-40 mL Intracatheter Q12H  . warfarin  0.5 mg Oral Once  . Warfarin - Pharmacist Dosing Inpatient   Does not apply Q24H   Continuous Infusions: . ampicillin-sulbactam (UNASYN) IV Stopped (08/11/17 1612)     LOS: 11 days    Time spent: 35 minutes. Greater than 50% of this time was spent in direct contact with the patient coordinating care.     Chaya Jan, MD [facility] Pager [phone]  If 7PM-7AM, please contact night-coverage [email or web address] Password [email or web address] 08/11/2017, 4:02 PM

## 2017-08-12 DIAGNOSIS — N179 Acute kidney failure, unspecified: Secondary | ICD-10-CM

## 2017-08-12 DIAGNOSIS — A419 Sepsis, unspecified organism: Principal | ICD-10-CM

## 2017-08-12 DIAGNOSIS — E870 Hyperosmolality and hypernatremia: Secondary | ICD-10-CM

## 2017-08-12 DIAGNOSIS — I5032 Chronic diastolic (congestive) heart failure: Secondary | ICD-10-CM

## 2017-08-12 DIAGNOSIS — G9341 Metabolic encephalopathy: Secondary | ICD-10-CM

## 2017-08-12 DIAGNOSIS — F0390 Unspecified dementia without behavioral disturbance: Secondary | ICD-10-CM

## 2017-08-12 DIAGNOSIS — I482 Chronic atrial fibrillation: Secondary | ICD-10-CM

## 2017-08-12 DIAGNOSIS — N183 Chronic kidney disease, stage 3 (moderate): Secondary | ICD-10-CM

## 2017-08-12 LAB — BASIC METABOLIC PANEL
Anion gap: 13 (ref 5–15)
BUN: 37 mg/dL — AB (ref 6–20)
CHLORIDE: 106 mmol/L (ref 101–111)
CO2: 37 mmol/L — ABNORMAL HIGH (ref 22–32)
CREATININE: 0.99 mg/dL (ref 0.44–1.00)
Calcium: 9.5 mg/dL (ref 8.9–10.3)
GFR, EST AFRICAN AMERICAN: 58 mL/min — AB (ref >60)
GFR, EST NON AFRICAN AMERICAN: 50 mL/min — AB (ref >60)
Glucose, Bld: 158 mg/dL — ABNORMAL HIGH (ref 65–99)
POTASSIUM: 3.8 mmol/L (ref 3.5–5.1)
SODIUM: 156 mmol/L — AB (ref 135–145)

## 2017-08-12 LAB — CBC
HCT: 41.6 % (ref 36.0–46.0)
Hemoglobin: 11.5 g/dL — ABNORMAL LOW (ref 12.0–15.0)
MCH: 27.6 pg (ref 26.0–34.0)
MCHC: 27.6 g/dL — ABNORMAL LOW (ref 30.0–36.0)
MCV: 100 fL (ref 78.0–100.0)
PLATELETS: 264 10*3/uL (ref 150–400)
RBC: 4.16 MIL/uL (ref 3.87–5.11)
RDW: 18.2 % — AB (ref 11.5–15.5)
WBC: 13.6 10*3/uL — AB (ref 4.0–10.5)

## 2017-08-12 LAB — PROTIME-INR
INR: 2.84
Prothrombin Time: 29.6 seconds — ABNORMAL HIGH (ref 11.4–15.2)

## 2017-08-12 LAB — GLUCOSE, CAPILLARY
GLUCOSE-CAPILLARY: 140 mg/dL — AB (ref 65–99)
Glucose-Capillary: 159 mg/dL — ABNORMAL HIGH (ref 65–99)

## 2017-08-12 MED ORDER — FUROSEMIDE 20 MG PO TABS: 20.0000 mg | ORAL_TABLET | Freq: Every day | ORAL | Status: DC

## 2017-08-12 MED ORDER — SODIUM CHLORIDE 0.9 % IV SOLN
3.0000 g | Freq: Three times a day (TID) | INTRAVENOUS | Status: DC
  Administered 2017-08-12 – 2017-08-13 (×4): 3 g via INTRAVENOUS
  Filled 2017-08-12 (×13): qty 3

## 2017-08-12 MED ORDER — SODIUM CHLORIDE 0.45 % IV SOLN
INTRAVENOUS | Status: DC
  Administered 2017-08-12: 08:00:00 via INTRAVENOUS

## 2017-08-12 MED ORDER — PANTOPRAZOLE SODIUM 40 MG PO PACK
40.0000 mg | PACK | Freq: Every day | ORAL | Status: DC
  Filled 2017-08-12: qty 20

## 2017-08-12 MED ORDER — PANTOPRAZOLE SODIUM 40 MG PO TBEC
40.0000 mg | DELAYED_RELEASE_TABLET | Freq: Every day | ORAL | Status: DC
  Administered 2017-08-12 – 2017-08-13 (×2): 40 mg via ORAL
  Filled 2017-08-12 (×2): qty 1

## 2017-08-12 MED ORDER — WARFARIN SODIUM 1 MG PO TABS
1.0000 mg | ORAL_TABLET | Freq: Once | ORAL | Status: AC
Start: 2017-08-12 — End: 2017-08-12
  Administered 2017-08-12: 1 mg via ORAL
  Filled 2017-08-12: qty 1

## 2017-08-12 NOTE — Progress Notes (Signed)
PROGRESS NOTE    [name]  ZOX:[id number] DOB: 04/07/1929 DOA: 07/30/2017 PCP: Ignatius Specking, MD   Brief Narrative:  84 year old woman admitted from home on 12/22 due to cough and generalized weakness.  She was found to have aspiration pneumonia and significant signs of aspiration on modified barium swallow.  She also developed A. fib with RVR while hospitalized.  She was seen by physical therapy and SNF was recommended.  Discussed with husband on 12/30 and he is not willing to pursue SNF and wants to take patient home when she is medically cleared.  Overnight on 12/30 CODE BLUE was called due to what was perceived to be agonal respirations following an aspiration event.  She was transferred to the ICU, did not require CPR.  Overnight on 12/31 again had issues with increasing hypoxemia.  Is now on high flow nasal cannula at 10-15 L.  Again overnight on 1/1 had another aspiration event with hypoxemia.  Assessment & Plan:  Active Problems:   Acute renal failure superimposed on stage 3 chronic kidney disease (HCC)   Sepsis (HCC)   Sepsis due to undetermined organism (HCC)   Chronic diastolic CHF (congestive heart failure) (HCC)   Acute on chronic respiratory failure with hypoxia (HCC)   Acute metabolic encephalopathy   Lobar pneumonia (HCC)   Permanent atrial fibrillation (HCC)   Dementia without behavioral disturbance   Aspiration into airway   Palliative care encounter   Goals of care, counseling/discussion  Sepsis -Secondary to recurrent aspiration pneumonia. -Sepsis parameters have resolved. -IV unasyn is being given to treat.  Clinically seems to be improving although high risk for recurrent repeated aspiration. -She continues to have repeated aspiration events. -Patient has advanced dementia and not able to make her own decisions.  Dysphagia -Underwent MBS with speech therapy and noted to have delay in initiation of swallow as well as silent aspiration.   -At this  time, family decided against PEG and prefers comfort feeding with understanding of aspiration risk, SLP has ordered Dys 1 diet with honey thickened liquids.   Acute on chronic respiratory failure with hypoxemia -Related to pneumonia as well as aspiration. -She is currently requiring 10-15 L of nasal cannula.  Acute on chronic kidney disease stage III -Creatinine is down to baseline of 1-1.1 on discharge.  UTI -Urine culture positive for Serratia, adequately treated with IV Unasyn.  Acute metabolic encephalopathy -Suspect related to sepsis. -She also has baseline dementia. -Improved to baseline per family.  Atrial fibrillation with rapid ventricular response -Likely exacerbated by respiratory illness. -Did initially require IV Cardizem infusion as well as IV metoprolol to control heart rate.  She has since been transitioned over to Coreg and oral diltiazem with improvement in heart rate. -She is chronically anticoagulated on Coumadin.  Pharmacy assisting in hospital.    Acute on chronic diastolic heart failure -Likely precipitated by IV hydration for pneumonia, treated with IV Lasix.  She now is dehydrated, will hold lasix for now.  -Echo in February 2018 showed an ejection fraction of 60-65% with decreased left ventricular diastolic compliance. -Overnight on 12/31 and 1/1 was given a dose of IV Lasix due to chest x-ray showing signs of mild interstitial edema. -I do not believe that she is markedly volume overloaded despite chest x-ray findings.  She remains on 20 mg of Lasix daily.  Hypernatremia - secondary to dehydration and poor oral intake and lasix, will increase free water, changed IVF to 0.45% NS.  Recheck in AM.   DVT  prophylaxis: Coumadin Code Status: Remains full code, discussed with husband Family Communication: Husband at bedside updated  Disposition Plan: Hopefully family will be able to take home tomorrow.  They plan to provide 24/7 care at home.   Transfer to med-surg  today.   Consultants:   Pulmonary, Dr. Juanetta Gosling  Procedures:   None  Antimicrobials:  Anti-infectives (From admission, onward)   Start     Dose/Rate Route Frequency Ordered Stop   08/09/17 1559  Ampicillin-Sulbactam (UNASYN) 3 g in sodium chloride 0.9 % 100 mL IVPB     3 g 200 mL/hr over 30 Minutes Intravenous Every 12 hours 08/09/17 0853     08/08/17 2100  Ampicillin-Sulbactam (UNASYN) 3 g in sodium chloride 0.9 % 100 mL IVPB  Status:  Discontinued     3 g 200 mL/hr over 30 Minutes Intravenous Every 6 hours 08/08/17 2053 08/09/17 0853   08/06/17 1400  amoxicillin-clavulanate (AUGMENTIN) 875-125 MG per tablet 1 tablet  Status:  Discontinued     1 tablet Oral Every 12 hours 08/06/17 1311 08/08/17 2053   08/01/17 1000  azithromycin (ZITHROMAX) 500 mg in dextrose 5 % 250 mL IVPB  Status:  Discontinued     500 mg 250 mL/hr over 60 Minutes Intravenous Every 24 hours 07/18/2017 0943 08/04/17 1024   08/01/17 1000  cefTRIAXone (ROCEPHIN) 1 g in dextrose 5 % 50 mL IVPB  Status:  Discontinued     1 g 100 mL/hr over 30 Minutes Intravenous Every 24 hours 08/02/2017 0943 07/27/2017 1411   07/24/2017 1415  azithromycin (ZITHROMAX) 500 mg in dextrose 5 % 250 mL IVPB  Status:  Discontinued     500 mg 250 mL/hr over 60 Minutes Intravenous  Once 08/04/2017 1403 07/16/2017 1409   07/27/2017 1415  piperacillin-tazobactam (ZOSYN) IVPB 3.375 g  Status:  Discontinued     3.375 g 12.5 mL/hr over 240 Minutes Intravenous Every 8 hours 07/25/2017 1403 08/06/17 1311   07/22/2017 1045  cefTRIAXone (ROCEPHIN) 1 g in dextrose 5 % 50 mL IVPB     1 g 100 mL/hr over 30 Minutes Intravenous  Once 08/06/2017 1033 07/19/2017 1158   07/27/2017 0930  cefTRIAXone (ROCEPHIN) 1 g in dextrose 5 % 50 mL IVPB     1 g 100 mL/hr over 30 Minutes Intravenous  Once 08/03/2017 0927 08/08/2017 1043   07/24/2017 0930  azithromycin (ZITHROMAX) 500 mg in dextrose 5 % 250 mL IVPB     500 mg 250 mL/hr over 60 Minutes Intravenous  Once 08/01/2017 9166 07/24/2017 1157       Subjective: Family reports that patient has been stable.  No coughing.  No apparent distress.    Objective: Vitals:   08/12/17 0400 08/12/17 0500 08/12/17 0538 08/12/17 0745  BP:   (!) 156/120   Pulse:      Resp:      Temp: 98.2 F (36.8 C)     TempSrc: Oral     SpO2:    95%  Weight:  56 kg (123 lb 7.3 oz)    Height:        Intake/Output Summary (Last 24 hours) at 08/12/2017 0802 Last data filed at 08/11/2017 1542 Gross per 24 hour  Intake 100 ml  Output -  Net 100 ml   Filed Weights   08/09/17 0500 08/11/17 0415 08/12/17 0500  Weight: 57.5 kg (126 lb 12.2 oz) 55.7 kg (122 lb 12.7 oz) 56 kg (123 lb 7.3 oz)    Examination:  General exam: awake, no  apparent distress, neb treatment being given.  Respiratory system: BBS no rales heard, no wheezing Cardiovascular system:R irregular rhythm, not tachycardic, normal s1, s2 sounds.  Gastrointestinal system: Abdomen is nondistended, soft and nontender. No organomegaly or masses felt. Normal bowel sounds heard. Central nervous system: Alert and oriented. No focal neurological deficits. Extremities: Trace bilateral pitting edema, positive pulses Skin: No rashes, lesions or ulcers Psychiatry: demented  Data Reviewed: I have personally reviewed following labs and imaging studies  CBC: Recent Labs  Lab 08/07/17 0445 08/08/17 1948 08/11/17 0606 08/12/17 0536  WBC 8.7 8.8 9.6 13.6*  NEUTROABS  --  7.7 8.8*  --   HGB 10.5* 10.6* 11.3* 11.5*  HCT 35.5* 36.8 39.6 41.6  MCV 96.2 98.4 98.8 100.0  PLT 137* 158 226 264   Basic Metabolic Panel: Recent Labs  Lab 08/07/17 0445 08/08/17 0613 08/08/17 1948 08/11/17 0606 08/12/17 0536  NA 145 144 145 152* 156*  K 3.4* 4.3 5.1 4.1 3.8  CL 101 101 102 103 106  CO2 32 31 30 36* 37*  GLUCOSE 133* 153* 243* 247* 158*  BUN 37* 40* 44* 41* 37*  CREATININE 1.22* 1.06* 1.38* 1.18* 0.99  CALCIUM 9.0 9.5 9.4 9.3 9.5   GFR: Estimated Creatinine Clearance: 36.1 mL/min (by C-G  formula based on SCr of 0.99 mg/dL). Liver Function Tests: Recent Labs  Lab 08/08/17 1948  AST 19  ALT 18  ALKPHOS 75  BILITOT 0.8  PROT 6.4*  ALBUMIN 2.8*   No results for input(s): LIPASE, AMYLASE in the last 168 hours. No results for input(s): AMMONIA in the last 168 hours. Coagulation Profile: Recent Labs  Lab 08/08/17 1948 08/09/17 0404 08/10/17 0359 08/11/17 0606 08/12/17 0536  INR 2.33 2.48 2.63 2.98 2.84   Cardiac Enzymes: Recent Labs  Lab 08/08/17 1948  TROPONINI <0.03   BNP (last 3 results) No results for input(s): PROBNP in the last 8760 hours. HbA1C: No results for input(s): HGBA1C in the last 72 hours. CBG: Recent Labs  Lab 08/10/17 0629 08/10/17 1112 08/11/17 0457 08/12/17 0041 08/12/17 0622  GLUCAP 154* 200* 246* 159* 140*   Lipid Profile: No results for input(s): CHOL, HDL, LDLCALC, TRIG, CHOLHDL, LDLDIRECT in the last 72 hours. Thyroid Function Tests: No results for input(s): TSH, T4TOTAL, FREET4, T3FREE, THYROIDAB in the last 72 hours. Anemia Panel: No results for input(s): VITAMINB12, FOLATE, FERRITIN, TIBC, IRON, RETICCTPCT in the last 72 hours. Urine analysis:    Component Value Date/Time   COLORURINE AMBER (A) 08/08/2017 2153   APPEARANCEUR CLOUDY (A) 08/08/2017 2153   LABSPEC 1.016 08/08/2017 2153   PHURINE 5.0 08/08/2017 2153   GLUCOSEU NEGATIVE 08/08/2017 2153   HGBUR NEGATIVE 08/08/2017 2153   BILIRUBINUR NEGATIVE 08/08/2017 2153   KETONESUR NEGATIVE 08/08/2017 2153   PROTEINUR 30 (A) 08/08/2017 2153   UROBILINOGEN 0.2 01/17/2015 1030   NITRITE NEGATIVE 08/08/2017 2153   LEUKOCYTESUR NEGATIVE 08/08/2017 2153    Recent Results (from the past 240 hour(s))  C difficile quick scan w PCR reflex     Status: None   Collection Time: 08/05/17  5:18 PM  Result Value Ref Range Status   C Diff antigen NEGATIVE NEGATIVE Final   C Diff toxin NEGATIVE NEGATIVE Final   C Diff interpretation No C. difficile detected.  Final     Radiology Studies: Dg Chest Port 1v Same Day  Result Date: 08/10/2017 CLINICAL DATA:  Increased oxygen demand.  Congestive heart failure. EXAM: PORTABLE CHEST 1 VIEW COMPARISON:  One-view chest x-ray from  the same day at 5:51 a.m. FINDINGS: Heart is enlarged. Diffuse interstitial and airspace disease is again seen. Bilateral pleural effusions are present. Basilar airspace disease is worse left than right. Aeration is slightly improved. A right-sided PICC line is in place. Pacing wires are noted. Aortic atherosclerosis is present. A chronic left humerus fracture is present. IMPRESSION: 1. Congestive heart failure with left greater than right interstitial edema and effusions. 2. Aeration is slightly improved. 3. Basilar airspace disease likely reflects atelectasis. Infection is not excluded. Electronically Signed   By: Marin Roberts M.D.   On: 08/10/2017 20:43   Scheduled Meds: . carvedilol  12.5 mg Oral BID WC  . Chlorhexidine Gluconate Cloth  6 each Topical Daily  . diltiazem  60 mg Oral Q6H  . donepezil  10 mg Oral QHS  . feeding supplement (GLUCERNA SHAKE)  237 mL Oral TID BM  . [START ON 08/16/2017] furosemide  20 mg Oral Daily  . ipratropium  0.5 mg Nebulization Q6H  . levalbuterol  1.25 mg Nebulization Q6H  . mouth rinse  15 mL Mouth Rinse BID  . memantine  20 mg Oral QHS  . multivitamin with minerals  1 tablet Oral Daily  . pantoprazole sodium  40 mg Oral Daily  . sodium chloride flush  10-40 mL Intracatheter Q12H  . warfarin  0.5 mg Oral Once  . Warfarin - Pharmacist Dosing Inpatient   Does not apply Q24H   Continuous Infusions: . sodium chloride    . ampicillin-sulbactam (UNASYN) IV Stopped (08/12/17 0530)     LOS: 12 days    Critical Care Time spent: 33 minutes. Greater than 50% of this time was spent in direct contact with the patient coordinating care.  Standley Dakins, MD [facility] Pager [phone]  If 7PM-7AM, please contact  night-coverage [email or web address] Password [email or web address] 08/12/2017, 8:02 AM

## 2017-08-12 NOTE — Progress Notes (Signed)
Subjective: She seems a little better today.  High flow nasal cannula still in place but she is on 12 L instead of 15 and her oxygenation is okay.  No new problems noted overnight.  Objective: Vital signs in last 24 hours: Temp:  [97.4 F (36.3 C)-98.5 F (36.9 C)] 98.2 F (36.8 C) (01/03 0400) Pulse Rate:  [41-126] 71 (01/03 0200) Resp:  [10-27] 16 (01/03 0200) BP: (127-177)/(52-154) 156/120 (01/03 0538) SpO2:  [90 %-95 %] 95 % (01/03 0745) Weight:  [56 kg (123 lb 7.3 oz)] 56 kg (123 lb 7.3 oz) (01/03 0500) Weight change: 0.3 kg (10.6 oz) Last BM Date: 08/11/17  Intake/Output from previous day: 01/02 0701 - 01/03 0700 In: 100 [IV Piggyback:100] Out: -   PHYSICAL EXAM General appearance: alert and no distress Resp: rhonchi bilaterally Cardio: irregularly irregular rhythm GI: soft, non-tender; bowel sounds normal; no masses,  no organomegaly Extremities: extremities normal, atraumatic, no cyanosis or edema Skin warm and dry.  Confused.  Lab Results:  Results for orders placed or performed during the hospital encounter of 07/18/2017 (from the past 48 hour(s))  Glucose, capillary     Status: Abnormal   Collection Time: 08/10/17 11:12 AM  Result Value Ref Range   Glucose-Capillary 200 (H) 65 - 99 mg/dL  Blood gas, arterial     Status: Abnormal   Collection Time: 08/10/17  8:15 PM  Result Value Ref Range   FIO2 100.00    Delivery systems NON-REBREATHER OXYGEN MASK    pH, Arterial 7.358 7.350 - 7.450   pCO2 arterial 62.6 (H) 32.0 - 48.0 mmHg   pO2, Arterial 72.2 (L) 83.0 - 108.0 mmHg   Bicarbonate 31.2 (H) 20.0 - 28.0 mmol/L   Acid-Base Excess 8.8 (H) 0.0 - 2.0 mmol/L   O2 Saturation 92.5 %   Patient temperature 37.0    Collection site LEFT BRACHIAL    Drawn by [id number]    Sample type ARTERIAL    Allens test (pass/fail) NOT INDICATED (A) PASS  Glucose, capillary     Status: Abnormal   Collection Time: 08/11/17  4:57 AM  Result Value Ref Range   Glucose-Capillary 246 (H)  65 - 99 mg/dL  Protime-INR     Status: Abnormal   Collection Time: 08/11/17  6:06 AM  Result Value Ref Range   Prothrombin Time 30.8 (H) 11.4 - 15.2 seconds   INR 1.61   Basic metabolic panel     Status: Abnormal   Collection Time: 08/11/17  6:06 AM  Result Value Ref Range   Sodium 152 (H) 135 - 145 mmol/L   Potassium 4.1 3.5 - 5.1 mmol/L   Chloride 103 101 - 111 mmol/L   CO2 36 (H) 22 - 32 mmol/L   Glucose, Bld 247 (H) 65 - 99 mg/dL   BUN 41 (H) 6 - 20 mg/dL   Creatinine, Ser 1.18 (H) 0.44 - 1.00 mg/dL   Calcium 9.3 8.9 - 10.3 mg/dL   GFR calc non Af Amer 41 (L) >60 mL/min   GFR calc Af Amer 47 (L) >60 mL/min    Comment: (NOTE) The eGFR has been calculated using the CKD EPI equation. This calculation has not been validated in all clinical situations. eGFR's persistently <60 mL/min signify possible Chronic Kidney Disease.    Anion gap 13 5 - 15  CBC with Differential/Platelet     Status: Abnormal   Collection Time: 08/11/17  6:06 AM  Result Value Ref Range   WBC 9.6 4.0 - 10.5 K/uL  RBC 4.01 3.87 - 5.11 MIL/uL   Hemoglobin 11.3 (L) 12.0 - 15.0 g/dL   HCT 39.6 36.0 - 46.0 %   MCV 98.8 78.0 - 100.0 fL   MCH 28.2 26.0 - 34.0 pg   MCHC 28.5 (L) 30.0 - 36.0 g/dL   RDW 17.7 (H) 11.5 - 15.5 %   Platelets 226 150 - 400 K/uL   Neutrophils Relative % 92 %   Neutro Abs 8.8 (H) 1.7 - 7.7 K/uL   Lymphocytes Relative 3 %   Lymphs Abs 0.3 (L) 0.7 - 4.0 K/uL   Monocytes Relative 5 %   Monocytes Absolute 0.5 0.1 - 1.0 K/uL   Eosinophils Relative 0 %   Eosinophils Absolute 0.0 0.0 - 0.7 K/uL   Basophils Relative 0 %   Basophils Absolute 0.0 0.0 - 0.1 K/uL  Glucose, capillary     Status: Abnormal   Collection Time: 08/12/17 12:41 AM  Result Value Ref Range   Glucose-Capillary 159 (H) 65 - 99 mg/dL  Protime-INR     Status: Abnormal   Collection Time: 08/12/17  5:36 AM  Result Value Ref Range   Prothrombin Time 29.6 (H) 11.4 - 15.2 seconds   INR 3.64   Basic metabolic panel      Status: Abnormal   Collection Time: 08/12/17  5:36 AM  Result Value Ref Range   Sodium 156 (H) 135 - 145 mmol/L   Potassium 3.8 3.5 - 5.1 mmol/L   Chloride 106 101 - 111 mmol/L   CO2 37 (H) 22 - 32 mmol/L   Glucose, Bld 158 (H) 65 - 99 mg/dL   BUN 37 (H) 6 - 20 mg/dL   Creatinine, Ser 0.99 0.44 - 1.00 mg/dL   Calcium 9.5 8.9 - 10.3 mg/dL   GFR calc non Af Amer 50 (L) >60 mL/min   GFR calc Af Amer 58 (L) >60 mL/min    Comment: (NOTE) The eGFR has been calculated using the CKD EPI equation. This calculation has not been validated in all clinical situations. eGFR's persistently <60 mL/min signify possible Chronic Kidney Disease.    Anion gap 13 5 - 15  CBC     Status: Abnormal   Collection Time: 08/12/17  5:36 AM  Result Value Ref Range   WBC 13.6 (H) 4.0 - 10.5 K/uL   RBC 4.16 3.87 - 5.11 MIL/uL   Hemoglobin 11.5 (L) 12.0 - 15.0 g/dL   HCT 41.6 36.0 - 46.0 %   MCV 100.0 78.0 - 100.0 fL   MCH 27.6 26.0 - 34.0 pg   MCHC 27.6 (L) 30.0 - 36.0 g/dL   RDW 18.2 (H) 11.5 - 15.5 %   Platelets 264 150 - 400 K/uL  Glucose, capillary     Status: Abnormal   Collection Time: 08/12/17  6:22 AM  Result Value Ref Range   Glucose-Capillary 140 (H) 65 - 99 mg/dL    ABGS Recent Labs    08/10/17 2015  PHART 7.358  PO2ART 72.2*  HCO3 31.2*   CULTURES Recent Results (from the past 240 hour(s))  C difficile quick scan w PCR reflex     Status: None   Collection Time: 08/05/17  5:18 PM  Result Value Ref Range Status   C Diff antigen NEGATIVE NEGATIVE Final   C Diff toxin NEGATIVE NEGATIVE Final   C Diff interpretation No C. difficile detected.  Final   Studies/Results: Dg Chest Port 1v Same Day  Result Date: 08/10/2017 CLINICAL DATA:  Increased oxygen demand.  Congestive heart failure. EXAM: PORTABLE CHEST 1 VIEW COMPARISON:  One-view chest x-ray from the same day at 5:51 a.m. FINDINGS: Heart is enlarged. Diffuse interstitial and airspace disease is again seen. Bilateral pleural  effusions are present. Basilar airspace disease is worse left than right. Aeration is slightly improved. A right-sided PICC line is in place. Pacing wires are noted. Aortic atherosclerosis is present. A chronic left humerus fracture is present. IMPRESSION: 1. Congestive heart failure with left greater than right interstitial edema and effusions. 2. Aeration is slightly improved. 3. Basilar airspace disease likely reflects atelectasis. Infection is not excluded. Electronically Signed   By: San Morelle M.D.   On: 08/10/2017 20:43    Medications:  Prior to Admission:  Medications Prior to Admission  Medication Sig Dispense Refill Last Dose  . ALPRAZolam (XANAX) 0.5 MG tablet Take 0.5 mg by mouth at bedtime as needed for anxiety or sleep.   07/30/2017 at 1930  . Biotin 5000 MCG TABS Take by mouth daily. 10,000 mcg daily   07/30/2017 at Unknown time  . Calcium Carbonate-Vitamin D (CALTRATE 600+D PO) Take 1 tablet by mouth 2 (two) times daily.    07/30/2017 at Unknown time  . carvedilol (COREG) 12.5 MG tablet Take 6.25-12.5 mg by mouth 2 (two) times daily with a meal. Take 12.5 mg in the morning and 6.25 mg in the evening.   08/06/2017 at 0800  . Cyanocobalamin (VITAMIN B-12) 5000 MCG TBDP Take 1 tablet by mouth daily.   07/30/2017 at Unknown time  . diltiazem (CARDIZEM) 90 MG tablet Take 1 tablet (90 mg total) by mouth every evening.   07/18/2017 at 0800  . donepezil (ARICEPT) 10 MG tablet Take 1 tablet by mouth at bedtime.  6 07/30/2017 at Unknown time  . feeding supplement, GLUCERNA SHAKE, (GLUCERNA SHAKE) LIQD Take 237 mLs by mouth 3 (three) times daily between meals.   07/30/2017 at Unknown time  . furosemide (LASIX) 20 MG tablet Take 1 tablet (20 mg total) by mouth daily.   08/05/2017 at 0800  . HYDROcodone-acetaminophen (NORCO/VICODIN) 5-325 MG tablet Take 1 tablet by mouth every 8 (eight) hours as needed for pain.  0 07/30/2017 at Unknown time  . mirtazapine (REMERON) 15 MG tablet Take 1  tablet by mouth at bedtime.  5 07/30/2017 at Unknown time  . Multiple Vitamin (MULTIVITAMIN) tablet Take 1 tablet by mouth daily.    07/30/2017 at Unknown time  . NAMZARIC 28-10 MG CP24 take one capsule by mouth once daily  12 07/30/2017 at Unknown time  . omeprazole (PRILOSEC) 20 MG capsule Take 20 mg by mouth daily.   07/30/2017 at Unknown time  . potassium chloride (K-DUR) 10 MEQ tablet Take 1 tablet (10 mEq total) by mouth daily. 30 tablet 0 07/30/2017 at Unknown time  . warfarin (COUMADIN) 2 MG tablet Take 2 mg by mouth daily at 6 PM.   07/30/2017 at 1930   Scheduled: . carvedilol  12.5 mg Oral BID WC  . Chlorhexidine Gluconate Cloth  6 each Topical Daily  . diltiazem  60 mg Oral Q6H  . donepezil  10 mg Oral QHS  . feeding supplement (GLUCERNA SHAKE)  237 mL Oral TID BM  . [START ON 08/16/2017] furosemide  20 mg Oral Daily  . ipratropium  0.5 mg Nebulization Q6H  . levalbuterol  1.25 mg Nebulization Q6H  . mouth rinse  15 mL Mouth Rinse BID  . memantine  20 mg Oral QHS  . multivitamin with minerals  1 tablet  Oral Daily  . pantoprazole sodium  40 mg Oral Daily  . sodium chloride flush  10-40 mL Intracatheter Q12H  . warfarin  0.5 mg Oral Once  . Warfarin - Pharmacist Dosing Inpatient   Does not apply Q24H   Continuous: . sodium chloride 55 mL/hr at 08/12/17 0812  . ampicillin-sulbactam (UNASYN) IV Stopped (08/12/17 0530)   FUW:[id number] **OR** acetaminophen, haloperidol lactate, levalbuterol, ondansetron **OR** ondansetron (ZOFRAN) IV, RESOURCE THICKENUP CLEAR, sodium chloride flush  Assesment: She was admitted with pneumonia acute on chronic hypoxic respiratory failure chronic atrial fib and has been aspirating.  She had acute metabolic encephalopathy on admission and she is back now to her baseline dementia.  She had elements of acute on chronic diastolic heart failure and that is been treated.  She is on antibiotics for aspiration.  She has had more trouble for the last 2-3  days but did not have much trouble yesterday.  She is still on high flow nasal cannula but the flow is being able to be reduced. Active Problems:   Acute renal failure superimposed on stage 3 chronic kidney disease (HCC)   Sepsis (HCC)   Sepsis due to undetermined organism (HCC)   Chronic diastolic CHF (congestive heart failure) (HCC)   Acute on chronic respiratory failure with hypoxia (HCC)   Acute metabolic encephalopathy   Lobar pneumonia (HCC)   Permanent atrial fibrillation (HCC)   Dementia without behavioral disturbance   Aspiration into airway   Palliative care encounter   Goals of care, counseling/discussion    Plan: Per Dr. Durenda Age note hopefully home tomorrow.  I will plan to follow more peripherally.  Thanks for allowing me to see her with you    LOS: 12 days   Aolani Piggott L 08/12/2017, 8:22 AM

## 2017-08-12 NOTE — Progress Notes (Signed)
ANTICOAGULATION CONSULT NOTE   Pharmacy Consult for coumadin Indication: atrial fibrillation  No Known Allergies  Patient Measurements: Height: 5\' 6"  (167.6 cm) Weight: 123 lb 7.3 oz (56 kg) IBW/kg (Calculated) : 59.3  Vital Signs: Temp: 98.1 F (36.7 C) (01/03 0800) Temp Source: Oral (01/03 0400) BP: 156/120 (01/03 0538) Pulse Rate: 71 (01/03 0200)  Labs: Recent Labs    08/10/17 0359 08/11/17 0606 08/12/17 0536  HGB  --  11.3* 11.5*  HCT  --  39.6 41.6  PLT  --  226 264  LABPROT 27.9* 30.8* 29.6*  INR 2.63 2.98 2.84  CREATININE  --  1.18* 0.99   Estimated Creatinine Clearance: 36.1 mL/min (by C-G formula based on SCr of 0.99 mg/dL).  Medications:  Was on coumadin 2mg  po daily PTA  Assessment: 84 yo lady to continue coumadin for afib. Admission INR was therapeutic. May need lower doses than home dose while on antibiotics and poor appetite.   INR today is therapeutic again today (trending down now), no bleeding reported.  Warfarin not given on 08/11/17 (NPO per RN), other PO meds given.  Goal of Therapy:  INR 2-3 Monitor platelets by anticoagulation protocol: Yes   Plan:  Coumadin 1mg  today x 1 Daily PT/INR Monitor for bleeding complications  FYI: Renal function is now improving. Unasyn has been adjusted to every 8 hours.  Mady Gemma, [facility] 08/12/2017,9:06 AM

## 2017-08-12 NOTE — Progress Notes (Signed)
  Speech Language Pathology Treatment: Dysphagia  Patient Details Name: [name] MRN: [id number] DOB: Aug 07, 1929 Today's Date: 08/12/2017 Time: 5427-0623 SLP Time Calculation (min) (ACUTE ONLY): 22 min  Assessment / Plan / Recommendation Clinical Impression  Pt seen at bedside with husband and son present. Pt had consumed breakfast tray (50% of Magic Cup, 50% eggs, 120 cc HTL) with assist from spouse. Pt had been drinking HTL from the spout of milk carton and SLP encouraged family to provide on spoon to prevent Pt from tipping head back. Pt agreeable to additional po intake with SLP and no overt signs of symptoms aspiration on consistencies presented. SLP assisted with oral care with suction toothbrush following meal and Pt able to complete herself with mod assist. Aspiration and reflux precautions were reinforced with Pt and family. Pt would likely benefit from hospital bed at home to elevate HOB at night due to reflux precautions. Spouse inquired about Arts administrator and pre-thickened liquids- will ask RD if possible to order from hospital.   HPI HPI: 84 year old female with a history of chronic respiratory failure, dementia, atrial fibrillation and diastolic heart failure, presents to the hospital with generalized weakness and cough.  Found to have possible aspiration pneumonia and admitted to the hospital for further treatments.  Patient developed rapid atrial fibrillation and was started on Cardizem infusion.  She is currently on intravenous antibiotics and IV fluids.      SLP Plan  Continue with current plan of care       Recommendations  Diet recommendations: Dysphagia 1 (puree);Honey-thick liquid Liquids provided via: Teaspoon Medication Administration: Crushed with puree Supervision: Staff to assist with self feeding;Full supervision/cueing for compensatory strategies Compensations: Slow rate;Small sips/bites;Multiple dry swallows after each bite/sip Postural Changes  and/or Swallow Maneuvers: Seated upright 90 degrees;Upright 30-60 min after meal                Oral Care Recommendations: Oral care before and after PO;Staff/trained caregiver to provide oral care(please have oral suction with suction toothbrush for Pt to u) Follow up Recommendations: 24 hour supervision/assistance Plan: Continue with current plan of care       Thank you,  Havery Moros, CCC-SLP [phone]       [name] 08/12/2017, 9:40 AM

## 2017-08-12 NOTE — Care Management Note (Signed)
Case Management Note  Patient Details  Name: KALISE LAMOUREAUX MRN: [id number] Date of Birth: [birth year]   If discussed at Long Length of Stay Meetings, dates discussed:  08/12/2016  Additional Comments:  Zaynah Chawla, Chrystine Oiler, RN 08/12/2017, 12:09 PM

## 2017-08-12 NOTE — Progress Notes (Signed)
Private Caregiver called nursing staff to room due to patient not responding to verbal stimuli. Entered room and patient not responding to verbal or physical stimuli. Breathing noted to be shallow. Oxygen saturation registering in the low 60's. Code Blue called and Code team responded.

## 2017-08-12 NOTE — Progress Notes (Signed)
Daily Progress Note   Patient Name: [name]       Date: 08/12/2017 DOB: 11/24/1928  Age: 84 y.o. MRN#: [id number] Attending Physician: Cleora Fleet, MD Primary Care Physician: Ignatius Specking, MD Admit Date: 08-15-2017  Reason for Consultation/Follow-up: Establishing goals of care and Psychosocial/spiritual support  Subjective: [name] is resting quietly in bed.  She is calm, cooperative, and pleasant.  She has known dementia.  Present at bedside today is her son, Taylinn Brabant, and his wife Archie Patten.  I asked Amil Amen if she feels that she is getting enough to eat and drink, she states she is.  I also ask if she feels that she is sleeping well at night, she says she is.  Kathlene November and I go into the family waiting room for discussion.  We talked about Mrs. [name]'s chronic health concerns, and Kathlene November seems very knowledgeable about her chronic diseases.  We talked about the chronic illness pathway, I share a diagram of what is normal and expected.  Kathlene November states that his goal is for his mother to be comfortable in her final days.  He states that he was very concerned that she was passing during this hospital admission.  We talked about recurrent aspiration pneumonia, that it can occur anytime suddenly with a devastating effect.  We talked about the natural changes that occur with the body, and that aspiration cannot be treated with a pill or treatment, diet modifications only.  Kathlene November shares that his mother has been married to Sullivan for approximately 10-12 years now.  Mike's father and [name] were first cousins.  Length of Stay: 12  Current Medications: Scheduled Meds:  . carvedilol  12.5 mg Oral BID WC  . Chlorhexidine Gluconate Cloth  6 each Topical Daily  . diltiazem  60 mg Oral Q6H    . donepezil  10 mg Oral QHS  . feeding supplement (GLUCERNA SHAKE)  237 mL Oral TID BM  . [START ON 08/16/2017] furosemide  20 mg Oral Daily  . ipratropium  0.5 mg Nebulization Q6H  . levalbuterol  1.25 mg Nebulization Q6H  . mouth rinse  15 mL Mouth Rinse BID  . memantine  20 mg Oral QHS  . multivitamin with minerals  1 tablet Oral Daily  . pantoprazole  40 mg Oral Q0600  .  sodium chloride flush  10-40 mL Intracatheter Q12H  . warfarin  1 mg Oral Once  . Warfarin - Pharmacist Dosing Inpatient   Does not apply Q24H    Continuous Infusions: . sodium chloride 55 mL/hr at 08/12/17 0812  . ampicillin-sulbactam (UNASYN) IV Stopped (08/12/17 1321)    PRN Meds: acetaminophen **OR** acetaminophen, haloperidol lactate, levalbuterol, ondansetron **OR** ondansetron (ZOFRAN) IV, RESOURCE THICKENUP CLEAR, sodium chloride flush  Physical Exam  Constitutional: No distress.  Appears frail, acutely/chronically ill.  Briefly makes and keeps eye contact  HENT:  Head: Atraumatic.  Cardiovascular: Normal rate.  Pulmonary/Chest: Effort normal. No respiratory distress.  Abdominal: Soft. She exhibits no distension.  Musculoskeletal: She exhibits no edema.  Neurological: She is alert.  Known dementia  Skin: Skin is warm and dry.  Psychiatric: She has a normal mood and affect.  Nursing note and vitals reviewed.           Vital Signs: BP 135/90   Pulse 72   Temp 98.1 F (36.7 C)   Resp 19   Ht 5\' 6"  (1.676 m)   Wt 56 kg (123 lb 7.3 oz)   SpO2 94%   BMI 19.93 kg/m  SpO2: SpO2: 94 % O2 Device: O2 Device: High Flow Nasal Cannula O2 Flow Rate: O2 Flow Rate (L/min): 12 L/min  Intake/output summary:   Intake/Output Summary (Last 24 hours) at 08/12/2017 1629 Last data filed at 08/12/2017 1621 Gross per 24 hour  Intake 668.25 ml  Output 500 ml  Net 168.25 ml   LBM: Last BM Date: 08/11/17 Baseline Weight: Weight: 52.2 kg (115 lb) Most recent weight: Weight: 56 kg (123 lb 7.3 oz)        Palliative Assessment/Data:    Flowsheet Rows     Most Recent Value  Intake Tab  Referral Department  Hospitalist  Unit at Time of Referral  ICU  Palliative Care Primary Diagnosis  Sepsis/Infectious Disease  Date Notified  08/09/17  Palliative Care Type  New Palliative care  Reason for referral  Clarify Goals of Care  Date of Admission  13-Aug-2017  Date first seen by Palliative Care  08/09/17  # of days Palliative referral response time  0 Day(s)  # of days IP prior to Palliative referral  9  Clinical Assessment  Palliative Performance Scale Score  30%  Pain Max last 24 hours  Not able to report  Pain Min Last 24 hours  Not able to report  Dyspnea Max Last 24 Hours  Not able to report  Dyspnea Min Last 24 hours  Not able to report  Psychosocial & Spiritual Assessment  Palliative Care Outcomes  Patient/Family meeting held?  Yes  Who was at the meeting?  Patient and caregiver at bedside, call to husband.  Palliative Care Outcomes  Clarified goals of care, Provided psychosocial or spiritual support      Patient Active Problem List   Diagnosis Date Noted  . Aspiration into airway   . Palliative care encounter   . Goals of care, counseling/discussion   . Sepsis (HCC) 13-Aug-2017  . Sepsis due to undetermined organism (HCC) Aug 13, 2017  . Chronic diastolic CHF (congestive heart failure) (HCC) 08/10/2017  . Acute on chronic respiratory failure with hypoxia (HCC) August 13, 2017  . Acute metabolic encephalopathy 08/18/2017  . Lobar pneumonia (HCC) 08/15/2017  . Permanent atrial fibrillation (HCC) 08/16/2017  . Dementia without behavioral disturbance 13-Aug-2017  . AKI (acute kidney injury) (HCC)   . Urinary tract infection without hematuria   . Dehydration   .  Essential hypertension 07/02/2015  . Chronic atrial fibrillation (HCC) 07/02/2015  . CKD (chronic kidney disease), stage III (HCC) 07/02/2015  . Renal artery stenosis (HCC) 07/02/2015  . Elevated transaminase level 01/18/2015   . FTT (failure to thrive) in adult 01/17/2015  . Hyperkalemia   . Protein-calorie malnutrition, severe (HCC) 09/04/2014  . Dementia 09/03/2014  . Acute renal failure superimposed on stage 3 chronic kidney disease (HCC) 09/03/2014  . GERD without esophagitis 09/03/2014  . Anxiety 09/03/2014  . Loss of weight 09/03/2014  . Physical deconditioning 09/03/2014  . C. difficile diarrhea 05/11/2014  . C. difficile colitis 04/13/2014  . Chronic respiratory failure with hypoxia (HCC) 04/13/2014  . Chronic diarrhea 03/05/2014  . Encounter for therapeutic drug monitoring 09/15/2013  . Gait difficulty 05/18/2013  . Chronic diastolic heart failure (HCC) 02/17/2013  . Chronic renal insufficiency   . Peripheral edema 02/03/2013  . Dysphagia 01/12/2011  . Dilated cardiomyopathy (HCC) 11/04/2010  . Mitral regurgitation 11/04/2010  . PFO (patent foramen ovale) 11/04/2010  . Chronic anticoagulation 11/04/2010  . Long term (current) use of anticoagulants 10/30/2010  . COMPRESSION FRACTURE, SPINE 10/10/2010  . ATHEROSCLEROSIS OF RENAL ARTERY 09/04/2010  . HTN (hypertension) 05/27/2009  . SINUS BRADYCARDIA 05/27/2009    Palliative Care Assessment & Plan   Patient Profile: 84 y.o.femalewith past medical history of chronic respiratory failure on 2 L home oxygen, dementia, symptomatic bradycardia with pacemaker, permanent A. fib, diastolic heart failure, stage III kidney disease, coronary artery disease, GERD, high blood pressureadmitted on Jan 19, 2019with sepsis secondary to pneumonia related to aspiration.   Assessment: Pneumonia related to aspiration;  Likely having silent aspiration at home.  Treated with IV antibiotics and breathing treatment.  Multiple episodes of aspiration noted during this hospitalization.  Possibly assigned to overfeeding?  Speech therapy following.   Recommendations/Plan:  At this point, continue to treat the treatable but no CPR, no intubation.  Verified with  husband at bedside.  Step- Daughter, [name] also in agreement via text.  Goals of Care and Additional Recommendations:  Limitations on Scope of Treatment: Treat the treatable but no CPR, no intubation.  Code Status:    Code Status Orders  (From admission, onward)        Start     Ordered   08/11/17 1601  Do not attempt resuscitation (DNR)  Continuous    Question Answer Comment  In the event of cardiac or respiratory ARREST Do not call a "code blue"   In the event of cardiac or respiratory ARREST Do not perform Intubation, CPR, defibrillation or ACLS   In the event of cardiac or respiratory ARREST Use medication by any route, position, wound care, and other measures to relive pain and suffering. May use oxygen, suction and manual treatment of airway obstruction as needed for comfort.      08/11/17 1600    Code Status History    Date Active Date Inactive Code Status Order ID Comments User Context   08/28/17 14:03 08/11/2017 16:00 Full Code [id number]  Catarina Hartshorn, MD Inpatient   01/17/2015 17:21 01/19/2015 19:27 Full Code [id number]  Standley Brooking, MD Inpatient   09/03/2014 17:52 09/07/2014 20:00 Full Code [id number]  Ozella Rocks, MD Inpatient   04/14/2014 16:03 04/19/2014 21:22 Full Code [id number]  Standley Brooking, MD Inpatient   04/13/2014 21:28 04/14/2014 16:03 Full Code [id number]  Houston Siren, MD Inpatient    Advance Directive Documentation     Most Recent Value  Type of Advance Directive  Healthcare Power  of Attorney  Pre-existing out of facility DNR order (yellow form or pink MOST form)  No data  "MOST" Form in Place?  No data       Prognosis:  < 6 months or less, would not be surprising based on functional status, health history, recurrent aspiration pneumonia.  Discharge Planning:  Likely return home with 24-hour caregivers already in place.  Care plan was discussed with nursing staff, case management, social worker, and Dr. Laural Benes.  Thank you for allowing  the Palliative Medicine Team to assist in the care of this patient.   Time In:  0940 Time Out:  1025 Total Time  45 minutes Prolonged Time Billed  yes       Greater than 50%  of this time was spent counseling and coordinating care related to the above assessment and plan.  Katheran Awe, NP  Please contact Palliative Medicine Team phone at [phone] for questions and concerns.

## 2017-08-13 DIAGNOSIS — T17908S Unspecified foreign body in respiratory tract, part unspecified causing other injury, sequela: Secondary | ICD-10-CM

## 2017-08-13 LAB — BASIC METABOLIC PANEL
ANION GAP: 10 (ref 5–15)
BUN: 41 mg/dL — ABNORMAL HIGH (ref 6–20)
CALCIUM: 9.2 mg/dL (ref 8.9–10.3)
CO2: 35 mmol/L — ABNORMAL HIGH (ref 22–32)
Chloride: 108 mmol/L (ref 101–111)
Creatinine, Ser: 1.04 mg/dL — ABNORMAL HIGH (ref 0.44–1.00)
GFR, EST AFRICAN AMERICAN: 55 mL/min — AB (ref >60)
GFR, EST NON AFRICAN AMERICAN: 47 mL/min — AB (ref >60)
GLUCOSE: 152 mg/dL — AB (ref 65–99)
POTASSIUM: 3.9 mmol/L (ref 3.5–5.1)
SODIUM: 153 mmol/L — AB (ref 135–145)

## 2017-08-13 LAB — GLUCOSE, CAPILLARY: GLUCOSE-CAPILLARY: 144 mg/dL — AB (ref 65–99)

## 2017-08-13 LAB — PROTIME-INR
INR: 2.89
PROTHROMBIN TIME: 30 s — AB (ref 11.4–15.2)

## 2017-08-13 MED ORDER — WARFARIN SODIUM 1 MG PO TABS: 1.0000 mg | ORAL_TABLET | Freq: Once | ORAL | Status: DC

## 2017-08-13 MED ORDER — DEXTROSE 5 % IV SOLN
INTRAVENOUS | Status: DC
  Administered 2017-08-13: 10:00:00 via INTRAVENOUS

## 2017-08-13 MED ORDER — MORPHINE SULFATE (CONCENTRATE) 10 MG/0.5ML PO SOLN
2.5000 mg | ORAL | Status: DC | PRN
Start: 2017-08-13 — End: 2017-08-13

## 2017-08-17 ENCOUNTER — Ambulatory Visit: Payer: Self-pay | Admitting: *Deleted

## 2017-08-17 DIAGNOSIS — Z5181 Encounter for therapeutic drug level monitoring: Secondary | ICD-10-CM

## 2017-09-10 NOTE — Progress Notes (Signed)
MD currently in room with patient.

## 2017-09-10 NOTE — Progress Notes (Addendum)
PT Cancellation Note  Patient Details Name: NATSUE GRONOWSKI MRN: [id number] DOB: 08/25/1928   Cancelled Treatment:    Reason Eval/Treat Not Completed: Medical issues which prohibited therapy(nursing reported patient O2 levels increased to 15 L this am from 12 L.) PT will follow up with nursing before attempting treatment.  Katina Dung. Hartnett-Rands, MS, PT Per Diem PT [facility] [phone]

## 2017-09-10 NOTE — Progress Notes (Addendum)
I am no longer closely following her but I was stopped by her nurse Johnsie Cancel RN regarding her oxygen.  She is still on high flow oxygen at 12 L/min and that is higher than we can obtain at home.  However her oxygen saturation has been pretty good so she may be able to be tapered down to a lower level and still be able to be discharged.  Attempt at tapering will start now

## 2017-09-10 NOTE — Progress Notes (Signed)
ANTICOAGULATION CONSULT NOTE   Pharmacy Consult for coumadin Indication: atrial fibrillation  No Known Allergies  Patient Measurements: Height: 5\' 6"  (167.6 cm) Weight: 127 lb 3.3 oz (57.7 kg) IBW/kg (Calculated) : 59.3  Vital Signs: Temp: 97.4 F (36.3 C) (01/03 2300) Temp Source: Axillary (01/03 2300) BP: 133/63 (01/04 0837) Pulse Rate: 66 (01/04 0837)  Labs: Recent Labs    08/11/17 0606 08/12/17 0536 09/04/2017 0600  HGB 11.3* 11.5*  --   HCT 39.6 41.6  --   PLT 226 264  --   LABPROT 30.8* 29.6* 30.0*  INR 2.98 2.84 2.89  CREATININE 1.18* 0.99 1.04*   Estimated Creatinine Clearance: 35.4 mL/min (A) (by C-G formula based on SCr of 1.04 mg/dL (H)).  Medications:  Was on coumadin 2mg  po daily PTA  Assessment: 84 yo lady to continue coumadin for afib. Admission INR was therapeutic. May need lower doses than home dose while on antibiotics and poor appetite.   INR today is therapeutic again today (trending down now), no bleeding reported.  Warfarin not given on 08/11/17 (NPO per RN), other PO meds given.  Goal of Therapy:  INR 2-3 Monitor platelets by anticoagulation protocol: Yes   Plan:  Coumadin 1mg  today x 1 Daily PT/INR Monitor for bleeding complications  Woodfin Ganja, [facility] 08/17/2017,9:23 AM

## 2017-09-10 NOTE — Progress Notes (Signed)
At approximately 1315, patient was awakened to take Cardizem at the request of family. Head of the bed was elevated at 90 degrees, even and equal chest rise and fall, and no visible difficulty with respirations. Pt was alert and was able to swallow crushed medication in applesauce with no difficulty. Patient consumed approximately 5 bites of applesauce with medication and denied any complaints. Son, daughter in law, and husband were at bedside. Pt was awake and alert, and husband requested to feed patient the remaining Glucerna from previous dose at approximately 1345. Husband requested to feed the patient himself, this RN was supervising in room. Family verbalizing aware of aspiration precautions. Patient had difficulty when consuming Glucerna when administered by husband. This RN instructed family not to feed patient any longer at this time. Son approached this RN after the incident and communicated concern about how the father had fed the patient, and stated, "I think he gave her way too much and made her cough and choke."  Head of the bed was left raised. Patient was suctioned. Dr. Laural Benes was paged. Respiratory was also paged. Patient showed obvious signs of difficulty breathing, respirations were labored, skin color changed to pale. Audible wheezing present. At approximately 1355, patient began to rapidly decline. At approximately 1400 patient had no pulse and no air movement. Dr Laural Benes at bedside. Press photographer and nursing supervisor aware. Doner service has been contacted.

## 2017-09-10 NOTE — Progress Notes (Signed)
1345 called to patients room, upon arrival, RN unable to obtain sat with good pleth and Spo2 reading 69% and patient agonally breathing and unresponsive. Pt was on 15 L HFNC. I went to obtain her 1400 breathing treatment as well as NRB. When I returned, patient was apneic. RN's at bedside.

## 2017-09-10 NOTE — Progress Notes (Signed)
Family has been informed that the chaplain has been contacted. Family denies any special requests at this time.

## 2017-09-10 NOTE — Progress Notes (Signed)
Attempted to taper down pt's oxygen via HFNC however pt was not able to tolerate the decrease and saturations immediately dropped. Will attempt later during the shift.

## 2017-09-10 NOTE — Progress Notes (Signed)
Informed by RN that pt's husband had given pt a "big gulp of glucerna" Pt began experiencing difficulty breathing shortly after. Vital signs were as follows   08/30/2017 1350  Vitals  BP (!) 73/51  BP Location Left Arm  BP Method Automatic  Patient Position (if appropriate) Lying  Pulse Rate 86  Pulse Rate Source Dinamap  Resp (!) 30  Oxygen Therapy  SpO2 90 %  O2 Device HFNC  O2 Flow Rate (L/min) 15 L/min  Pulse Oximetry Type Continuous  Pain Assessment  Pain Assessment PAINAD  PAINAD (Pain Assessment in Advanced Dementia)  Breathing 0  Negative Vocalization 0  Facial Expression 0  Body Language 0  Consolability 0  PAINAD Score 0   Pt unresponsive to voice or touch. Respiratory therapist is currently in room. Pt's O2 saturation went from 90 to 76. MD E-Paged.

## 2017-09-10 NOTE — Discharge Summary (Signed)
Expiration Note  JHENNA ENQUIST  MR#: [id number]  DOB:1929-04-23  Date of Admission: 2017-08-20 Date of Death: Sep 02, 2017  Pronounced: 1400  Attending Physician:Micha Erck Laural Benes  Patient's PCP: Ignatius Specking, MD  Consults: Treatment Team:  Kari Baars, MD  Cause of Death: Acute respiratory failure Aspiration pneumonia  Present on Admission: . Sepsis (HCC) Dysphagia Chronic kidney disease UTI Chronic persistent atrial fibrillation Metabolic encephalopathy Chronic diastolic heart failure Hypernatremia  Hospital Course: Please see hospital records, consult notes, imaging for full details of this admission. The patient experienced another aspiration event at approximately 1345 today.  Respiratory team was called to the patient's room and found patient to be hypoxic.  Apparently she aspirated during a feeding and developed acute respiratory failure and subsequently became apneic and expired.  The patient's family was at the bedside.  They were well aware of the risks of oral feeding and the high risk of aspiration.  The patient was already on 15 L high flow nasal cannula.  The patient became apneic and was subsequently pronounced dead at 1400 by MD.  Family was present at the bedside.  Chaplain support for family ordered. Death Certificate completed.  Signed: Taras Rask 09/02/17, 2:17 PM

## 2017-09-10 NOTE — Progress Notes (Signed)
Daily Progress Note   Patient Name: [name]       Date: 08-24-2017 DOB: Nov 03, 1928  Age: 84 y.o. MRN#: [id number] Attending Physician: Cleora Fleet, MD Primary Care Physician: Ignatius Specking, MD Admit Date: 08/07/2017  Reason for Consultation/Follow-up: Establishing goals of care and Psychosocial/spiritual support  Subjective: As I enter the room, [name] is lying quietly in bed with her husband [name], son Kathlene November and daughter-in-law Archie Patten leaning over her.  They appear concerned.  [name] denies issues or concerns, she will briefly open her eyes making and keeping eye contact.  She denies difficulty breathing or pain.  She has required more oxygen, and at this point is back at 15 L via nasal cannula. I share that I believe [name] has worsened.  Her husband [name] states he agrees.  Geralyn Flash, and I go to the family room for a meeting.  We talked about finding a balance between comfort and treatment.  I share that over the next 24-48 hours they may be called to make choices.  I share the concept of treat the treatable but no extraordinary measures, allowing natural death.  We also talked about the concept of let nature take its course.  I give an example of when this would be appropriate sharing that although it is not illegal or unethical, we understand that some people have a moral problem with not doing everything.  At this point [name] states that he does have a moral problem with not continuing care.  We talked about watching for meaningful recovery.  We also talked about the possibility of a sudden decline that is irreversible leading to death.  [name] agrees to the use of by mouth as needed morphine for breathlessness.  We talked about what comfort care would look like.  All  questions answered, support given.  I share my concern that [name] may not survive this hospitalization.  [name] states that he is also worried this may be the case.   Length of Stay: 13  Current Medications: Scheduled Meds:  . carvedilol  12.5 mg Oral BID WC  . Chlorhexidine Gluconate Cloth  6 each Topical Daily  . diltiazem  60 mg Oral Q6H  . donepezil  10 mg Oral QHS  . feeding supplement (GLUCERNA SHAKE)  237 mL Oral TID BM  . [  START ON 08/16/2017] furosemide  20 mg Oral Daily  . ipratropium  0.5 mg Nebulization Q6H  . levalbuterol  1.25 mg Nebulization Q6H  . mouth rinse  15 mL Mouth Rinse BID  . memantine  20 mg Oral QHS  . multivitamin with minerals  1 tablet Oral Daily  . pantoprazole  40 mg Oral Q0600  . sodium chloride flush  10-40 mL Intracatheter Q12H  . warfarin  1 mg Oral Once  . Warfarin - Pharmacist Dosing Inpatient   Does not apply Q24H    Continuous Infusions: . ampicillin-sulbactam (UNASYN) IV Stopped (08/31/2017 0626)  . dextrose 50 mL/hr at 09/08/2017 0945    PRN Meds: acetaminophen **OR** acetaminophen, haloperidol lactate, levalbuterol, ondansetron **OR** ondansetron (ZOFRAN) IV, RESOURCE THICKENUP CLEAR, sodium chloride flush  Physical Exam  Constitutional: No distress.  Appears weak and frail, briefly opens eyes and makes eye contact  HENT:  Head: Atraumatic.  Cardiovascular: Normal rate and regular rhythm.  Pulmonary/Chest: Effort normal. No respiratory distress.  Respiratory status tenuous  Abdominal: Soft. She exhibits no distension.  Musculoskeletal: She exhibits no edema.  Skin: Skin is warm and dry.  Psychiatric:  Calm, cooperative  Nursing note and vitals reviewed.           Vital Signs: BP 133/63   Pulse 66   Temp (!) 97.4 F (36.3 C) (Axillary)   Resp (!) 21   Ht 5\' 6"  (1.676 m)   Wt 57.7 kg (127 lb 3.3 oz)   SpO2 (!) 89%   BMI 20.53 kg/m  SpO2: SpO2: (!) 89 % O2 Device: O2 Device: High Flow Nasal Cannula O2 Flow Rate: O2 Flow  Rate (L/min): 15 L/min  Intake/output summary:   Intake/Output Summary (Last 24 hours) at 08/24/2017 1257 Last data filed at 08/29/2017 1610 Gross per 24 hour  Intake 1662.84 ml  Output 800 ml  Net 862.84 ml   LBM: Last BM Date: 08/12/17 Baseline Weight: Weight: 52.2 kg (115 lb) Most recent weight: Weight: 57.7 kg (127 lb 3.3 oz)       Palliative Assessment/Data:    Flowsheet Rows     Most Recent Value  Intake Tab  Referral Department  Hospitalist  Unit at Time of Referral  ICU  Palliative Care Primary Diagnosis  Sepsis/Infectious Disease  Date Notified  08/09/17  Palliative Care Type  New Palliative care  Reason for referral  Clarify Goals of Care  Date of Admission  08/29/2017  Date first seen by Palliative Care  08/09/17  # of days Palliative referral response time  0 Day(s)  # of days IP prior to Palliative referral  9  Clinical Assessment  Palliative Performance Scale Score  30%  Pain Max last 24 hours  Not able to report  Pain Min Last 24 hours  Not able to report  Dyspnea Max Last 24 Hours  Not able to report  Dyspnea Min Last 24 hours  Not able to report  Psychosocial & Spiritual Assessment  Palliative Care Outcomes  Patient/Family meeting held?  Yes  Who was at the meeting?  Patient and caregiver at bedside, call to husband.  Palliative Care Outcomes  Clarified goals of care, Provided psychosocial or spiritual support      Patient Active Problem List   Diagnosis Date Noted  . Aspiration into airway   . Palliative care encounter   . Goals of care, counseling/discussion   . Sepsis (HCC) 2017/08/29  . Sepsis due to undetermined organism (HCC) 08-29-2017  . Chronic diastolic  CHF (congestive heart failure) (HCC) 24-Aug-2017  . Acute on chronic respiratory failure with hypoxia (HCC) 08-24-2017  . Acute metabolic encephalopathy Aug 24, 2017  . Lobar pneumonia (HCC) 08/24/17  . Permanent atrial fibrillation (HCC) 08/24/2017  . Dementia without behavioral  disturbance 2017-08-24  . AKI (acute kidney injury) (HCC)   . Urinary tract infection without hematuria   . Dehydration   . Essential hypertension 07/02/2015  . Chronic atrial fibrillation (HCC) 07/02/2015  . CKD (chronic kidney disease), stage III (HCC) 07/02/2015  . Renal artery stenosis (HCC) 07/02/2015  . Elevated transaminase level 01/18/2015  . FTT (failure to thrive) in adult 01/17/2015  . Hyperkalemia   . Protein-calorie malnutrition, severe (HCC) 09/04/2014  . Dementia 09/03/2014  . Acute renal failure superimposed on stage 3 chronic kidney disease (HCC) 09/03/2014  . GERD without esophagitis 09/03/2014  . Anxiety 09/03/2014  . Loss of weight 09/03/2014  . Physical deconditioning 09/03/2014  . C. difficile diarrhea 05/11/2014  . C. difficile colitis 04/13/2014  . Chronic respiratory failure with hypoxia (HCC) 04/13/2014  . Chronic diarrhea 03/05/2014  . Encounter for therapeutic drug monitoring 09/15/2013  . Gait difficulty 05/18/2013  . Chronic diastolic heart failure (HCC) 02/17/2013  . Chronic renal insufficiency   . Peripheral edema 02/03/2013  . Dysphagia 01/12/2011  . Dilated cardiomyopathy (HCC) 11/04/2010  . Mitral regurgitation 11/04/2010  . PFO (patent foramen ovale) 11/04/2010  . Chronic anticoagulation 11/04/2010  . Long term (current) use of anticoagulants 10/30/2010  . COMPRESSION FRACTURE, SPINE 10/10/2010  . ATHEROSCLEROSIS OF RENAL ARTERY 09/04/2010  . HTN (hypertension) 05/27/2009  . SINUS BRADYCARDIA 05/27/2009    Palliative Care Assessment & Plan   Patient Profile: 84 y.o.femalewith past medical history of chronic respiratory failure on 2 L home oxygen, dementia, symptomatic bradycardia with pacemaker, permanent A. fib, diastolic heart failure, stage III kidney disease, coronary artery disease, GERD, high blood pressureadmitted on January 15, 2019with sepsis secondary to pneumonia related to aspiration.  Assessment: Pneumonia related to  aspiration; Likely having silent aspiration at home. Treated with IV antibiotics and breathing treatment. Multiple episodes of aspiration noted during this hospitalization. Possibly assigned to overfeeding? Speech therapy following.   Recommendations/Plan:  At this point, continue to treat the treatable but no CPR, no intubation. Verified with husband at bedside. Step- Daughter, [name] also in agreement via text.  Meeting with son, Kathlene November 1/3.  He agrees to allowing natural death.  At this point, [name] has worsened.  Her husband [name] states he can see this.  We talked about finding a balance between comfort and treatment.  I share that over the next 24-48 hours they may be called to make choices.   Goals of Care and Additional Recommendations:  Limitations on Scope of Treatment: Treat the treatable, no CPR, no intubation.  Code Status:    Code Status Orders  (From admission, onward)        Start     Ordered   08/11/17 1601  Do not attempt resuscitation (DNR)  Continuous    Question Answer Comment  In the event of cardiac or respiratory ARREST Do not call a "code blue"   In the event of cardiac or respiratory ARREST Do not perform Intubation, CPR, defibrillation or ACLS   In the event of cardiac or respiratory ARREST Use medication by any route, position, wound care, and other measures to relive pain and suffering. May use oxygen, suction and manual treatment of airway obstruction as needed for comfort.      08/11/17 1600  Code Status History    Date Active Date Inactive Code Status Order ID Comments User Context   08/20/2017 14:03 08/11/2017 16:00 Full Code [id number]  Catarina Hartshorn, MD Inpatient   01/17/2015 17:21 01/19/2015 19:27 Full Code [id number]  Standley Brooking, MD Inpatient   09/03/2014 17:52 09/07/2014 20:00 Full Code [id number]  Ozella Rocks, MD Inpatient   04/14/2014 16:03 04/19/2014 21:22 Full Code [id number]  Standley Brooking, MD Inpatient   04/13/2014  21:28 04/14/2014 16:03 Full Code [id number]  Houston Siren, MD Inpatient    Advance Directive Documentation     Most Recent Value  Type of Advance Directive  Healthcare Power of Attorney  Pre-existing out of facility DNR order (yellow form or pink MOST form)  No data  "MOST" Form in Place?  No data       Prognosis:  < 2 weeks would not be surprising based on current decline in respiratory status requiring 15 L of high flow oxygen via nasal cannula.  Also related to history of functional status, health history, recurrent aspiration pneumonia.  Discharge Planning:  To be determined, based on outcomes.  In hospital death would not be surprising.  Care plan was discussed with nursing staff, case management, social worker, and Dr. Laural Benes.  Thank you for allowing the Palliative Medicine Team to assist in the care of this patient.   Time In:  1145 Time Out:  1225 Total Time  40 minutes Prolonged Time Billed  no       Greater than 50%  of this time was spent counseling and coordinating care related to the above assessment and plan.  Katheran Awe, NP  Please contact Palliative Medicine Team phone at [phone] for questions and concerns.

## 2017-09-10 DEATH — deceased

## 2017-11-01 ENCOUNTER — Ambulatory Visit: Payer: Medicare Other | Admitting: Cardiovascular Disease

## 2019-05-30 IMAGING — DX DG CHEST 2V
2 series · 2 of 2 positions shown · non-contrast
Comparison: 06/01/2017

CLINICAL DATA: Tachycardia

EXAM:
CHEST  2 VIEW

[chest lat]
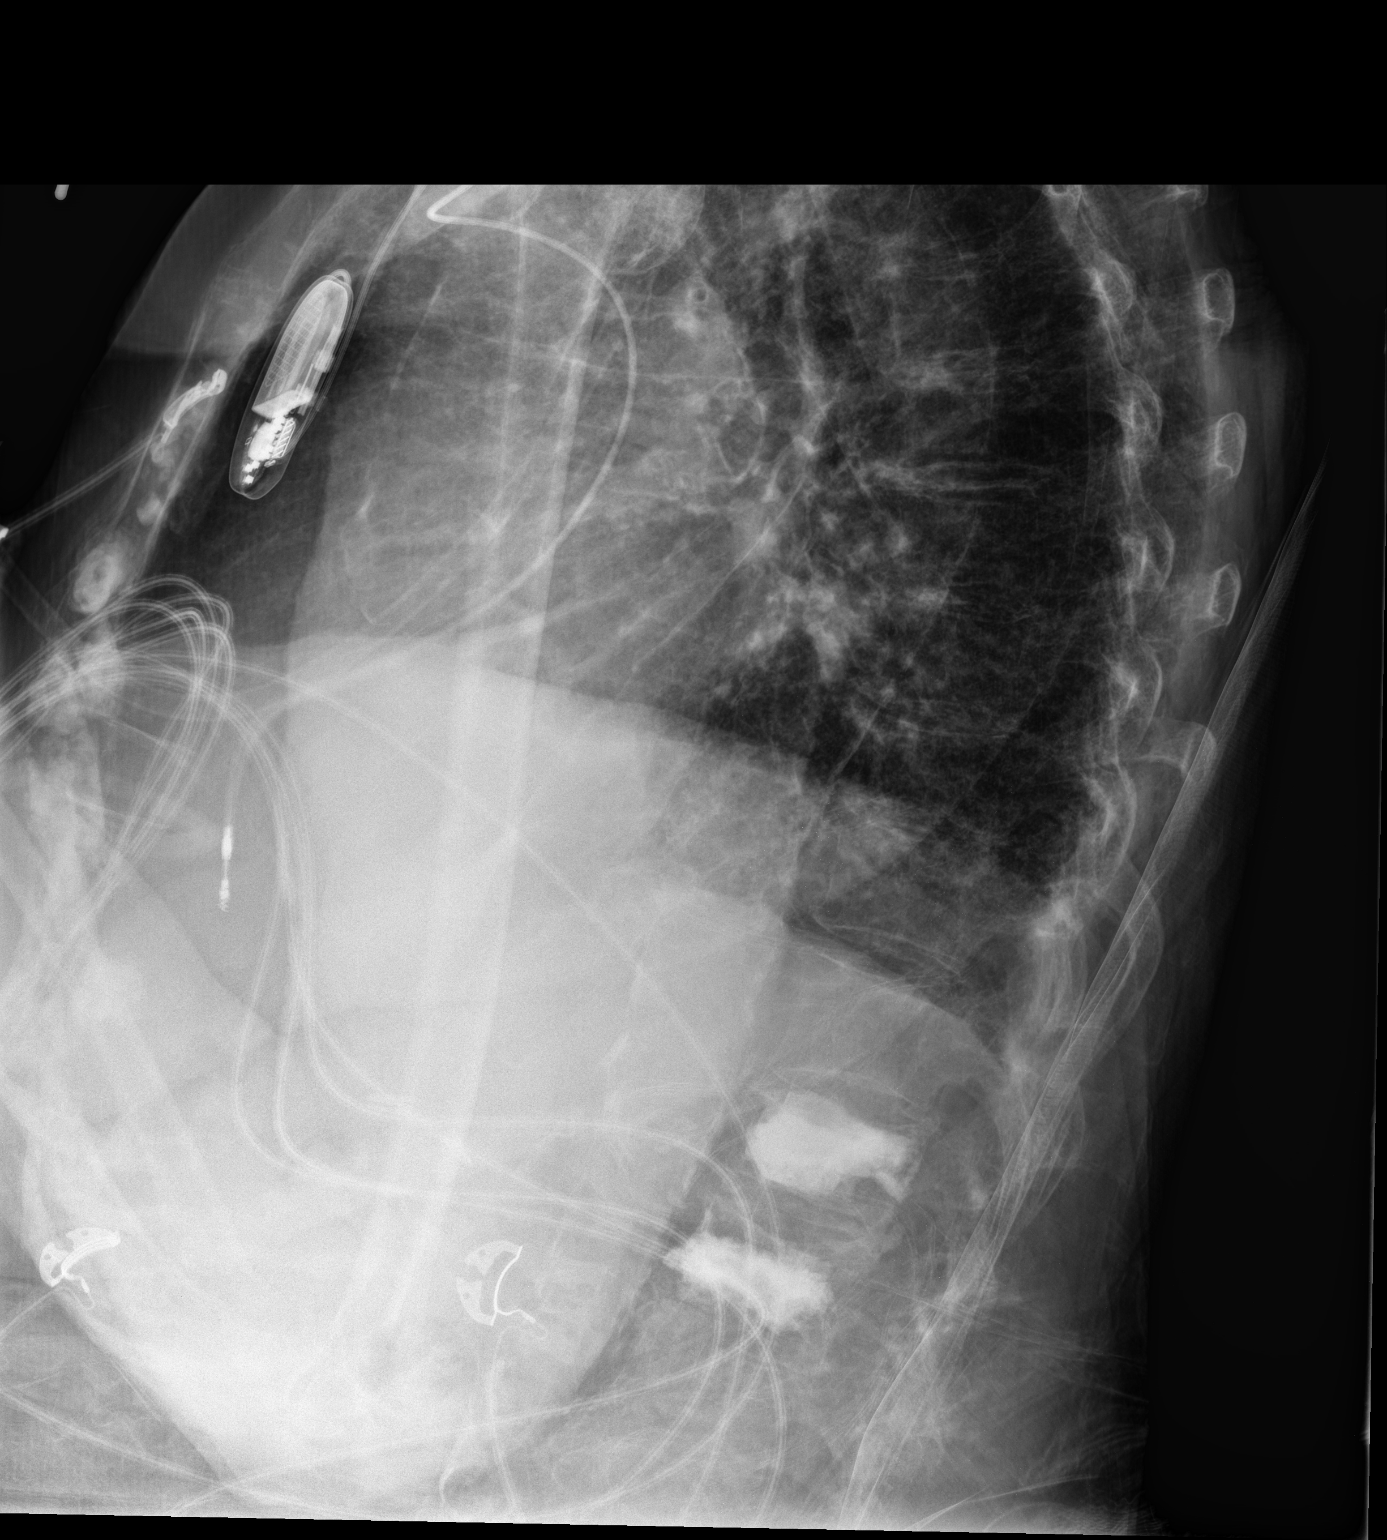

[chest ap]
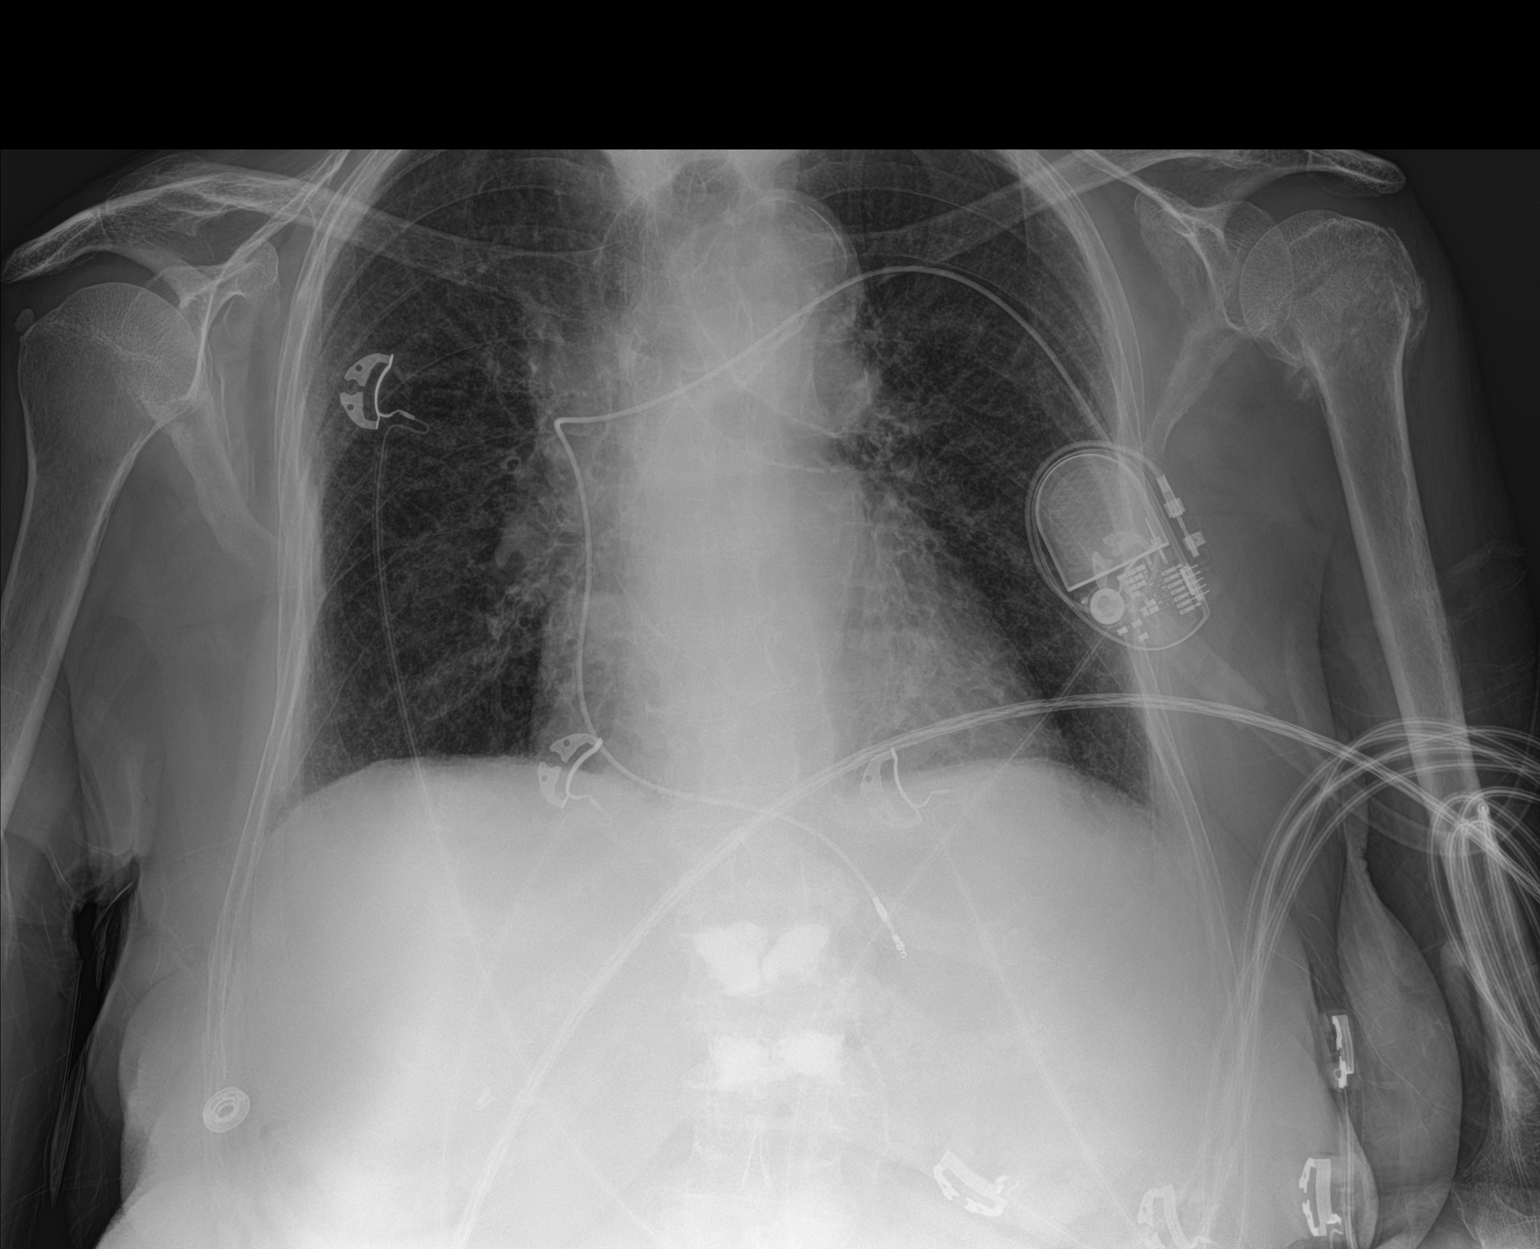

[2 of 2 positions shown; findings below may reference images not displayed]

FINDINGS: Cardiac shadow is stable. Pacing device is again seen. Changes of
prior vertebral augmentation are again noted. The lungs are clear
bilaterally. No acute bony abnormality is noted. Aortic
atherosclerotic changes are seen.
IMPRESSION: No acute abnormality noted.
